# Patient Record
Sex: Male | Born: 1935 | Race: White | Hispanic: No | Marital: Married | Smoking: Former smoker
Health system: Southern US, Community
[De-identification: ages and names within clinical notes are randomized; demographics above are authoritative.]

## PROBLEM LIST (undated history)

## (undated) DIAGNOSIS — R55 Syncope and collapse: Secondary | ICD-10-CM

## (undated) DIAGNOSIS — I480 Paroxysmal atrial fibrillation: Secondary | ICD-10-CM

## (undated) DIAGNOSIS — I214 Non-ST elevation (NSTEMI) myocardial infarction: Secondary | ICD-10-CM

## (undated) DIAGNOSIS — N281 Cyst of kidney, acquired: Secondary | ICD-10-CM

## (undated) DIAGNOSIS — I639 Cerebral infarction, unspecified: Secondary | ICD-10-CM

## (undated) DIAGNOSIS — R7301 Impaired fasting glucose: Secondary | ICD-10-CM

## (undated) DIAGNOSIS — Z87828 Personal history of other (healed) physical injury and trauma: Secondary | ICD-10-CM

## (undated) DIAGNOSIS — I10 Essential (primary) hypertension: Secondary | ICD-10-CM

## (undated) DIAGNOSIS — C61 Malignant neoplasm of prostate: Secondary | ICD-10-CM

## (undated) DIAGNOSIS — I2510 Atherosclerotic heart disease of native coronary artery without angina pectoris: Secondary | ICD-10-CM

## (undated) DIAGNOSIS — M109 Gout, unspecified: Secondary | ICD-10-CM

## (undated) DIAGNOSIS — I80202 Phlebitis and thrombophlebitis of unspecified deep vessels of left lower extremity: Secondary | ICD-10-CM

## (undated) DIAGNOSIS — K627 Radiation proctitis: Secondary | ICD-10-CM

## (undated) DIAGNOSIS — E119 Type 2 diabetes mellitus without complications: Secondary | ICD-10-CM

## (undated) DIAGNOSIS — E785 Hyperlipidemia, unspecified: Secondary | ICD-10-CM

## (undated) HISTORY — DX: Hyperlipidemia, unspecified: E78.5

## (undated) HISTORY — PX: CORNEAL TRANSPLANT: SHX108

## (undated) HISTORY — DX: Cyst of kidney, acquired: N28.1

## (undated) HISTORY — DX: Gout, unspecified: M10.9

## (undated) HISTORY — DX: Impaired fasting glucose: R73.01

## (undated) HISTORY — DX: Phlebitis and thrombophlebitis of unspecified deep vessels of left lower extremity: I80.202

## (undated) HISTORY — DX: Syncope and collapse: R55

## (undated) HISTORY — DX: Malignant neoplasm of prostate: C61

---

## 2001-02-21 ENCOUNTER — Encounter: Payer: Self-pay | Admitting: Family Medicine

## 2001-02-21 ENCOUNTER — Ambulatory Visit (HOSPITAL_COMMUNITY): Admission: RE | Admit: 2001-02-21 | Discharge: 2001-02-21 | Payer: Self-pay | Admitting: Family Medicine

## 2001-12-06 ENCOUNTER — Encounter: Payer: Self-pay | Admitting: Family Medicine

## 2001-12-06 ENCOUNTER — Ambulatory Visit (HOSPITAL_COMMUNITY): Admission: RE | Admit: 2001-12-06 | Discharge: 2001-12-06 | Payer: Self-pay | Admitting: Family Medicine

## 2003-06-22 ENCOUNTER — Ambulatory Visit (HOSPITAL_COMMUNITY): Admission: RE | Admit: 2003-06-22 | Discharge: 2003-06-22 | Payer: Self-pay | Admitting: Family Medicine

## 2003-07-18 ENCOUNTER — Ambulatory Visit (HOSPITAL_COMMUNITY): Admission: RE | Admit: 2003-07-18 | Discharge: 2003-07-18 | Payer: Self-pay | Admitting: Internal Medicine

## 2003-07-18 HISTORY — PX: COLONOSCOPY: SHX174

## 2005-04-22 ENCOUNTER — Observation Stay (HOSPITAL_COMMUNITY): Admission: AD | Admit: 2005-04-22 | Discharge: 2005-04-24 | Payer: Self-pay | Admitting: Family Medicine

## 2006-08-09 ENCOUNTER — Ambulatory Visit: Payer: Self-pay | Admitting: Cardiology

## 2006-08-09 ENCOUNTER — Inpatient Hospital Stay (HOSPITAL_COMMUNITY): Admission: EM | Admit: 2006-08-09 | Discharge: 2006-08-10 | Payer: Self-pay | Admitting: Emergency Medicine

## 2006-10-04 ENCOUNTER — Ambulatory Visit (HOSPITAL_COMMUNITY): Admission: RE | Admit: 2006-10-04 | Discharge: 2006-10-04 | Payer: Self-pay | Admitting: Family Medicine

## 2007-11-15 ENCOUNTER — Ambulatory Visit (HOSPITAL_COMMUNITY): Admission: RE | Admit: 2007-11-15 | Discharge: 2007-11-15 | Payer: Self-pay | Admitting: Family Medicine

## 2007-11-16 ENCOUNTER — Ambulatory Visit (HOSPITAL_COMMUNITY): Admission: RE | Admit: 2007-11-16 | Discharge: 2007-11-16 | Payer: Self-pay | Admitting: Family Medicine

## 2007-12-07 ENCOUNTER — Ambulatory Visit (HOSPITAL_COMMUNITY): Admission: RE | Admit: 2007-12-07 | Discharge: 2007-12-07 | Payer: Self-pay | Admitting: Family Medicine

## 2008-03-12 ENCOUNTER — Encounter: Admission: RE | Admit: 2008-03-12 | Discharge: 2008-03-12 | Payer: Self-pay | Admitting: Rheumatology

## 2009-08-26 DIAGNOSIS — Z87828 Personal history of other (healed) physical injury and trauma: Secondary | ICD-10-CM

## 2009-08-26 HISTORY — DX: Personal history of other (healed) physical injury and trauma: Z87.828

## 2009-09-11 ENCOUNTER — Inpatient Hospital Stay (HOSPITAL_COMMUNITY): Admission: EM | Admit: 2009-09-11 | Discharge: 2009-09-12 | Payer: Self-pay | Admitting: Emergency Medicine

## 2009-11-29 ENCOUNTER — Emergency Department (HOSPITAL_COMMUNITY): Admission: EM | Admit: 2009-11-29 | Discharge: 2009-11-29 | Payer: Self-pay | Admitting: Emergency Medicine

## 2009-12-03 ENCOUNTER — Inpatient Hospital Stay (HOSPITAL_COMMUNITY): Admission: EM | Admit: 2009-12-03 | Discharge: 2009-12-05 | Payer: Self-pay | Admitting: Emergency Medicine

## 2010-04-08 LAB — POCT I-STAT, CHEM 8
Calcium, Ion: 1.24 mmol/L (ref 1.12–1.32)
Chloride: 103 mEq/L (ref 96–112)
HCT: 41 % (ref 39.0–52.0)
Hemoglobin: 13.9 g/dL (ref 13.0–17.0)
Potassium: 4.7 mEq/L (ref 3.5–5.1)

## 2010-04-08 LAB — CBC
HCT: 35.6 % — ABNORMAL LOW (ref 39.0–52.0)
Hemoglobin: 12.5 g/dL — ABNORMAL LOW (ref 13.0–17.0)
Hemoglobin: 13.3 g/dL (ref 13.0–17.0)
MCHC: 34 g/dL (ref 30.0–36.0)
MCHC: 34.5 g/dL (ref 30.0–36.0)
RDW: 13.6 % (ref 11.5–15.5)
WBC: 16.3 10*3/uL — ABNORMAL HIGH (ref 4.0–10.5)
WBC: 8.8 10*3/uL (ref 4.0–10.5)

## 2010-04-08 LAB — GLUCOSE, CAPILLARY
Glucose-Capillary: 167 mg/dL — ABNORMAL HIGH (ref 70–99)
Glucose-Capillary: 185 mg/dL — ABNORMAL HIGH (ref 70–99)
Glucose-Capillary: 195 mg/dL — ABNORMAL HIGH (ref 70–99)
Glucose-Capillary: 197 mg/dL — ABNORMAL HIGH (ref 70–99)
Glucose-Capillary: 218 mg/dL — ABNORMAL HIGH (ref 70–99)

## 2010-04-08 LAB — BASIC METABOLIC PANEL
Calcium: 9.7 mg/dL (ref 8.4–10.5)
GFR calc Af Amer: >60 mL/min (ref >60)
GFR calc non Af Amer: >60 mL/min (ref >60)
GFR calc non Af Amer: >60 mL/min (ref >60)
Glucose, Bld: 163 mg/dL — ABNORMAL HIGH (ref 70–99)
Glucose, Bld: 195 mg/dL — ABNORMAL HIGH (ref 70–99)
Potassium: 5.2 mEq/L — ABNORMAL HIGH (ref 3.5–5.1)
Sodium: 138 mEq/L (ref 135–145)
Sodium: 139 mEq/L (ref 135–145)

## 2010-04-08 LAB — GRAM STAIN

## 2010-04-08 LAB — DIFFERENTIAL
Basophils Absolute: 0.1 10*3/uL (ref 0.0–0.1)
Basophils Relative: 0 % (ref 0–1)
Eosinophils Absolute: 0.1 10*3/uL (ref 0.0–0.7)
Neutro Abs: 10.7 10*3/uL — ABNORMAL HIGH (ref 1.7–7.7)
Neutrophils Relative %: 86 % — ABNORMAL HIGH (ref 43–77)

## 2010-04-08 LAB — SYNOVIAL CELL COUNT + DIFF, W/ CRYSTALS
Eosinophils-Synovial: 0 % (ref 0–1)
WBC, Synovial: 8310 /mm3 — ABNORMAL HIGH (ref 0–200)

## 2010-04-08 LAB — BODY FLUID CULTURE: Culture: NO GROWTH

## 2010-04-11 LAB — BASIC METABOLIC PANEL
BUN: 12 mg/dL (ref 6–23)
CO2: 22 mEq/L (ref 19–32)
Calcium: 8.3 mg/dL — ABNORMAL LOW (ref 8.4–10.5)
Calcium: 9.2 mg/dL (ref 8.4–10.5)
Creatinine, Ser: 1.09 mg/dL (ref 0.4–1.5)
GFR calc Af Amer: >60 mL/min (ref >60)
GFR calc non Af Amer: >60 mL/min (ref >60)
GFR calc non Af Amer: >60 mL/min (ref >60)
Potassium: 3.7 mEq/L (ref 3.5–5.1)
Sodium: 137 mEq/L (ref 135–145)

## 2010-04-11 LAB — DIFFERENTIAL
Basophils Absolute: 0 10*3/uL (ref 0.0–0.1)
Eosinophils Relative: 1 % (ref 0–5)
Lymphocytes Relative: 5 % — ABNORMAL LOW (ref 12–46)
Lymphocytes Relative: 7 % — ABNORMAL LOW (ref 12–46)
Lymphs Abs: 0.5 10*3/uL — ABNORMAL LOW (ref 0.7–4.0)
Monocytes Relative: 5 % (ref 3–12)
Neutro Abs: 8 10*3/uL — ABNORMAL HIGH (ref 1.7–7.7)
Neutro Abs: 9.6 10*3/uL — ABNORMAL HIGH (ref 1.7–7.7)
Neutrophils Relative %: 88 % — ABNORMAL HIGH (ref 43–77)

## 2010-04-11 LAB — HEPATIC FUNCTION PANEL
Albumin: 3.2 g/dL — ABNORMAL LOW (ref 3.5–5.2)
Alkaline Phosphatase: 68 U/L (ref 39–117)
Bilirubin, Direct: 0.2 mg/dL (ref 0.0–0.3)
Total Bilirubin: 0.9 mg/dL (ref 0.3–1.2)

## 2010-04-11 LAB — URINE CULTURE
Colony Count: NO GROWTH
Culture  Setup Time: 201108180221
Culture: NO GROWTH

## 2010-04-11 LAB — CBC
HCT: 38 % — ABNORMAL LOW (ref 39.0–52.0)
Hemoglobin: 12.9 g/dL — ABNORMAL LOW (ref 13.0–17.0)
Platelets: 138 10*3/uL — ABNORMAL LOW (ref 150–400)
RBC: 3.78 MIL/uL — ABNORMAL LOW (ref 4.22–5.81)
RDW: 13.6 % (ref 11.5–15.5)
WBC: 11.2 10*3/uL — ABNORMAL HIGH (ref 4.0–10.5)

## 2010-04-11 LAB — CORTISOL: Cortisol, Plasma: 13.4 ug/dL

## 2010-04-11 LAB — GLUCOSE, CAPILLARY
Glucose-Capillary: 119 mg/dL — ABNORMAL HIGH (ref 70–99)
Glucose-Capillary: 98 mg/dL (ref 70–99)

## 2010-04-11 LAB — POCT CARDIAC MARKERS
CKMB, poc: 1.9 ng/mL (ref 1.0–8.0)
Troponin i, poc: <0.05 ng/mL (ref 0.00–0.09)

## 2010-04-11 LAB — CULTURE, BLOOD (ROUTINE X 2)
Report Status: 8222011
Report Status: 8222011

## 2010-04-11 LAB — MRSA PCR SCREENING: MRSA by PCR: NEGATIVE

## 2010-04-11 LAB — URINALYSIS, ROUTINE W REFLEX MICROSCOPIC
Bilirubin Urine: NEGATIVE
Ketones, ur: NEGATIVE mg/dL
Nitrite: NEGATIVE
pH: 7 (ref 5.0–8.0)

## 2010-04-11 LAB — LACTIC ACID, PLASMA: Lactic Acid, Venous: 1.1 mmol/L (ref 0.5–2.2)

## 2010-04-11 LAB — CARDIAC PANEL(CRET KIN+CKTOT+MB+TROPI)
Relative Index: INVALID (ref 0.0–2.5)
Relative Index: INVALID (ref 0.0–2.5)
Total CK: 34 U/L (ref 7–232)
Total CK: 46 U/L (ref 7–232)
Troponin I: 0.01 ng/mL (ref 0.00–0.06)

## 2010-04-11 LAB — URIC ACID: Uric Acid, Serum: 10 mg/dL — ABNORMAL HIGH (ref 4.0–7.8)

## 2010-04-11 LAB — HEMOCCULT GUIAC POC 1CARD (OFFICE): Fecal Occult Bld: NEGATIVE

## 2010-05-13 ENCOUNTER — Ambulatory Visit (HOSPITAL_COMMUNITY)
Admission: RE | Admit: 2010-05-13 | Discharge: 2010-05-13 | Disposition: A | Discharge status: Home / Self Care | Payer: Medicare Other | Source: Ambulatory Visit | Attending: Internal Medicine | Admitting: Internal Medicine

## 2010-05-13 ENCOUNTER — Other Ambulatory Visit (HOSPITAL_COMMUNITY): Payer: Self-pay | Admitting: Internal Medicine

## 2010-05-13 DIAGNOSIS — M7730 Calcaneal spur, unspecified foot: Secondary | ICD-10-CM | POA: Insufficient documentation

## 2010-05-13 DIAGNOSIS — Q766 Other congenital malformations of ribs: Secondary | ICD-10-CM

## 2010-05-13 DIAGNOSIS — M25579 Pain in unspecified ankle and joints of unspecified foot: Secondary | ICD-10-CM

## 2010-05-13 DIAGNOSIS — Q767 Congenital malformation of sternum: Secondary | ICD-10-CM

## 2010-06-10 NOTE — Discharge Summary (Signed)
[name], [name]               ACCOUNT NO.:  [id number]   MEDICAL RECORD NO.:  [id number]          PATIENT TYPE:  INP   LOCATION:  A215                          FACILITY:  [facility]   PHYSICIAN:  Scott Moores, MD   DATE OF BIRTH:  1931/09/27   DATE OF ADMISSION:  08/09/2006  DATE OF DISCHARGE:  07/15/2008LH                               DISCHARGE SUMMARY   PRIMARY CARE PHYSICIAN:  [name], M.D.   DISCHARGE DIAGNOSIS:  1. Chest pain to rule out acute coronary artery syndrome.  Acute      coronary artery syndrome is ruled out.  2. Hypertension fairly controlled.  3. History of transient ischemic attack on Plavix.  4. Arthritis, stable.   HOME MEDICATIONS:  1. Metoprolol 50 mg p.o. once a day.  2. Plavix 75 mg once a day.   HOSPITAL COURSE:  [name] is a 77 year old male patient with history  of cerebrovascular accident in the past, history of hypertension and  history of arthritis who presented with chest pain starting on the  anterior chest which radiates to the left arm and shoulder,  intermittent, which was relieved by nitroglycerin in the emergency room.  The patient was admitted to rule out acute coronary artery syndrome and  3 serial cardiac enzymes were sent which were normal and serial EKG was  also done with no significant abnormality except right branch bundle  block sign.  He was evaluated by cardiologist as well who found no  significant abnormality.  Echocardiogram was done and it was with no  significant abnormality with ejection fraction of 60-65%.  Stress test  was done today and we will follow that.  The patient will be discharged  after the stress test result is available.  He will have followup with  his PMD for further management and followup.   Currently, he is very stable without any pain.  Vital signs:  Blood  pressure 138/68, temperature 98, pulse rate 57, respiratory rate 20.  Saturating well.  His basic labs are also normal.  White  blood cell 70,  hemoglobin is 13 and hematocrit is 38, platelet count 151.  Chemistries  also normal with sodium 142, potassium 3.4, chloride 110, bicarb is 26,  glucose is a little bit elevated at 138.  BUN is 18 and creatinine is 1.  Three serial cardiac enzymes within normal range.   The patient will be discharged after the stress test result is available  after a few hours and he was advised to have followup with his PMD, Dr.  Karleen [name].      Scott Moores, MD  Electronically Signed     MT/MEDQ  D:  08/10/2006  T:  08/11/2006  Job:  [id number]   cc:   [name], M.D.  Fax: [phone]

## 2010-06-10 NOTE — Procedures (Signed)
NAMEIZMAEL, DUROSS               ACCOUNT NO.:  [id number]   MEDICAL RECORD NO.:  [id number]          PATIENT TYPE:  INP   LOCATION:  A215                          FACILITY:  [facility]   PHYSICIAN:  Cristy Hilts. Jacinto Halim, MD       DATE OF BIRTH:  Mar 29, 1931   DATE OF PROCEDURE:  08/09/2006  DATE OF DISCHARGE:                                ECHOCARDIOGRAM   PROCEDURE:  Doppler echocardiogram.   INDICATIONS FOR PROCEDURE:  Chest pain, hypertension.  Please evaluate  wall motion abnormality.   Aortic root is 3.4 cm.   Left atrium is 4.2 cm.   Septum 1.3 cm.   Posterior wall 1.3 cm.   Left ventricular diastolic dimension 4.3 cm.  Left ventricular systolic  dimension 2.8 cm.   Ejection fraction 60 to 65%.   Left atrium:  The left atrium shows mild left atrial enlargement.   Left ventricle:  The left ventricle shows moderate basal septal  hypertrophy and mild concentric left ventricular hypertrophy.  There is  no evidence of left ventricular outflow tract obstruction.  There is no  wall motion abnormality.  Ejection fraction is estimated at 60 to 65%.   Right atrium:  The right atrium is normal.   Right ventricle:  The right ventricle is normal.   Pericardium:  The pericardium is normal.   Aortic valve:  The aortic valve is normal.   Mitral valve:  The mitral valve is normal.   Pulmonic valve:  The pulmonic valve is normal.   Tricuspid valve:  The tricuspid valve is normal.   FINAL INTERPRETATION:  1. Normal left ventricular systolic function with ejection fraction 60      to 65%.  2. Moderate basal septal hypertrophy and mild concentric left      ventricular hypertrophy without evidence of left ventricular      outflow tract obstruction.  3. No significant valvular abnormality. Dopplers are within normal      limits.      Cristy Hilts. Jacinto Halim, MD  Electronically Signed     JRG/MEDQ  D:  08/09/2006  T:  08/10/2006  Job:  [id number]   cc:   Ninetta Lights D. Felecia Shelling, MD  Fax: [phone]

## 2010-06-10 NOTE — Consult Note (Signed)
NAMEKRUZ, CHIU               ACCOUNT NO.:  [id number]   MEDICAL RECORD NO.:  [id number]          PATIENT TYPE:  INP   LOCATION:  A215                          FACILITY:  [facility]   PHYSICIAN:  Gerrit Friends. Dietrich Pates, MD, FACCDATE OF BIRTH:  [birth year]   DATE OF CONSULTATION:  08/09/2006  DATE OF DISCHARGE:                                 CONSULTATION   PRIMARY CARE PHYSICIAN:  Kirk Ruths, M.D.   HISTORY OF PRESENT ILLNESS:  A 77 year old gentleman with multiple  cardiovascular risk factors but no known coronary disease admitted to  the hospital with chest discomfort.  Mr. Raz noted the sudden onset  of moderately severe mid substernal chest discomfort early this morning.  The pain at times increased or decreased in severity, but was  essentially constant.  There was associated numbness in the left arm.  No dyspnea nor diaphoresis was noted.  There was a gradual decrease in  severity in the emergency department.  Symptoms were ultimately relieved  with sublingual nitroglycerin.  Mr. Vandervliet has had chest discomfort in  the past, but nothing quite like this.  He has a hard time specifying  the character of the discomfort.  It was not clearly pressure or  fullness.   Ms. Gaertner has had a history of hypertension that has apparently been  well-controlled.  He has never had diabetes.  He has been told that his  lipid status is acceptable.  He has mild known cerebrovascular disease,  having presented with a probable TIA in March of 2007.  MRA suggested  some basilar artery stenosis.  Carotid ultrasound revealed  atherosclerotic plaques without focal disease.   ADDITIONAL MEDICAL PROBLEMS:  1. Gout.  2. The patient has had bilateral cornea transplants.   MEDICATIONS ON ADMISSION:  1. Metoprolol 50 mg daily.  2. Clopidogrel 75 mg daily.   ALLERGIES:  There are no known allergies.   SOCIAL HISTORY:  Married and lives locally; no excessive use of alcohol;  chews tobacco, but  has not smoked cigarettes.  Retired.   FAMILY HISTORY:  Positive for CVA and hypertension.   REVIEW OF SYSTEMS:  Partial dentures; corrective lenses.  All other  systems reviewed and are negative.   PHYSICAL EXAMINATION:  GENERAL:  Somewhat gruff gentleman in no acute  distress.  VITAL SIGNS:  Temperature is 97, blood pressure 120/75, heart rate 55  and regular, respirations 20, O2 saturation 98% on room air.  HEENT:  Anicteric sclerae; normal lids and conjunctivae.  NECK:  No jugular venous distension; normal carotid upstrokes without  bruits.  ENDOCRINE:  No thyromegaly.  SKIN:  No significant lesions.  HEMATOPOIETIC:  No adenopathy.  LUNGS:  Clear.  CARDIAC:  Distant first and second heart sounds.  ABDOMEN:  Soft and nontender; no organomegaly.  EXTREMITIES:  No edema; distal pulses intact.  NEUROMUSCULAR:  Symmetric strength and tone; normal cranial nerves.  MUSCULOSKELETAL:  No joint deformities.   ELECTROCARDIOGRAM:  Sinus bradycardia; right bundle branch block; left  anterior fascicular block.  No acute abnormalities.   CHEST X-RAY:  Unremarkable.   LABORATORY DATA:  Other  laboratory notable for negative cardiac markers.  CBC is normal.  A chemistry profile is normal except for minimal  hyperglycemia and minimal hypokalemia.  A lipid profile is available  from 2007.  At that time, total cholesterol was 186, triglycerides 102,  LDL 135 and HDL 32.  TSH was also normal at 0.8.   IMPRESSION:  Mr. Closson presents with chest discomfort that is worrisome  in terms of its location and radiation.  Somewhat reassuring is the  absence of acute EKG abnormalities, negative cardiac markers and a  benign exam.  In this setting, either a stress test or coronary  angiography would be a reasonable first test.  The patient prefers a  noninvasive workup.  A stress Myoview study will be scheduled for the  second hospital day.  Serial cardiac markers and EKGs will be completed.  In the  presence of cerebrovascular disease, albeit mild, and modest  dyslipidemia, the patient would likely benefit from treatment with a  statin.   We appreciate the request for consultation and will be happy to manage  this nice patient with you.      Gerrit Friends. Dietrich Pates, MD, [facility]  Electronically Signed     RMR/MEDQ  D:  08/10/2006  T:  08/11/2006  Job:  [id number]

## 2010-06-10 NOTE — H&P (Signed)
[name], [name]               ACCOUNT NO.:  [id number]   MEDICAL RECORD NO.:  [id number]          PATIENT TYPE:  INP   LOCATION:  A215                          FACILITY:  [facility]   PHYSICIAN:  Marcello Moores, MD   DATE OF BIRTH:  Apr 21, 1931   DATE OF ADMISSION:  08/09/2006  DATE OF DISCHARGE:  LH                              HISTORY & PHYSICAL   PMD:  Dr. Karleen Hampshire.   CHIEF COMPLAINTS:  Chest pain.   HISTORY OF PRESENT ILLNESS:  [name] is a 77 year old male patient,  with history of cerebrovascular accident in the past and hypertension,  who presented with the above complaint.  The patient stated that he had  sudden onset of chest pain this morning around 7:00 a.m. after he took  his breakfast and it was anterior chest pain, pressure type, which  radiates to the left shoulder and arm.  And he stated that it is about 6-  7 in severity out of 10.  He denied any vomiting, no palpitation, no  sweating, no dizziness, no fever.  The patient denied any such episode  before and he stated that he had a mini stroke years back and he had  hypertension and he takes metoprolol for that and Plavix for the history  of mini stroke.  Otherwise, the patient is relatively healthy.   REVIEW OF SYSTEMS:  10-point review of systems is as dictated in HPI,  otherwise he denied any fever, no GI complaints, no urinary complaints.   ALLERGIES:  To TETANUS VACCINES.   SOCIAL HISTORY:  He is married and lives in [place] with his wife and  he denies any smoking, alcohol or drug abuse but he chews tobacco.   FAMILY HISTORY:  It was stated that he had history of CVA and  hypertension in the family.   PAST MEDICAL HISTORY:  1. TIA in 2007, on Plavix.  2. Hypertension, fairly controlled.  3. Arthritis.   HOME MEDICATIONS:  1. Metoprolol 50 mg once a day.  2. Plavix 75 mg once a day.   PHYSICAL EXAMINATION:  The patient is in the bed without any distress  and stated the pain has subsided  now after taking medication.  VITAL SIGNS:  Blood pressure 138/73, temperature 98, pulse is 63, and  respiratory rate 20, and saturation is 98%.  HEENT:  She has pink conjunctivae.  Nonicteric sclerae.  NECK:  Is supple.  CHEST:  Good air entry bilaterally.  CVS:  S1-S2 well heard, regular.  ABDOMEN:  Soft.  No area of tenderness.  Normoactive bowel sounds.  EXTREMITIES:  No pedal edema.  CNS:  He is alert and well oriented.  No neurological deficit.   LABS:  Chemistry - sodium 142, potassium 3.4, chloride 110, and bicarb  26.  Glucose is 138.  BUN 18, creatinine 1, calcium 1.  Troponin is less  than 0.05 and CK-MB is 3.1.  On the hematology - white blood cells 7,  hemoglobin is 13, hematocrit 38, and platelet count is 151.  EKG showed  normal sinus rhythm with right bundle branch block.  Otherwise, there  are no ST T wave change in elevation.  Chest x-ray is normal.   ASSESSMENT:  1. Chest pain.  To rule out acute coronary syndrome.  The patient is      elderly man with typical chest pain which starts on the anterior      chest and radiates to the left shoulder and arm and this is      significant and will admit him to the floor for further cardiac      workup and will do cardiac markers every 8 hours with      electrocardiogram and reevaluate the patient's pain.  Currently he      has relieved it by nitroglycerin.  And will do echocardiogram as      well and will consult Cardiology for further assessment.  Will put      him on angiotensin-converting enzyme inhibitor, aspirin, and will      continue his Plavix, and will put him on nitroglycerin as needed as      well.  For now, I will hold metoprolol because his pulse rate was      in the lower 60s and upper 50s on the monitor and, if it is not      continued to be on the lower side, we will restart it.  2. Hypertension.  Fairly controlled and will monitor his blood      pressure and will restart his home medications depending on       clinical evaluation.  3. History of transient ischemic attack.  For that, he was on Plavix      and will continue with his Plavix.  Currently he has no issues      related to that.      Marcello Moores, MD  Electronically Signed     MT/MEDQ  D:  08/09/2006  T:  08/10/2006  Job:  [id number]

## 2010-06-13 NOTE — Op Note (Signed)
NAME:  [name], [name]                         ACCOUNT NO.:  [id number]   MEDICAL RECORD NO.:  [id number]                   PATIENT TYPE:  AMB   LOCATION:  DAY                                  FACILITY:  [facility]   PHYSICIAN:  R. Roetta Sessions, M.D.              DATE OF BIRTH:  11/16/1931   DATE OF PROCEDURE:  07/18/2003  DATE OF DISCHARGE:                                 OPERATIVE REPORT   PROCEDURE:  Screening colonoscopy.   INDICATIONS FOR PROCEDURE:  The patient is a 77 year old gentleman sent over  at the courtesy of Dr. Yetta Numbers for colorectal cancer screening.  He is  devoid of any lower GI tract symptoms.  He has never had a colonoscopy.  He  tells me his brother had a resection for colorectal cancer in his 57s.  Colonoscopy is now being done as a screening maneuver.  This approach has  been discussed with the patient at length.  Potential risks, benefits, and  alternatives have been reviewed, questions answered, and he is agreeable.  Please see my handwritten H&P for more information.   PROCEDURE NOTE:  O2 saturations, blood pressure, pulse, and respirations  were monitored throughout the entire procedure.   CONSCIOUS SEDATION:  1. Versed 4 mg IV.  2. Demerol 100 mg IV in divided doses.   INSTRUMENT:  Olympus video chip system.   FINDINGS:  Digital rectal examination revealed no abnormalities.   ENDOSCOPIC FINDINGS:  The prep was good.   RECTUM:  Examination of rectal mucosa including retroflexed view of the anal  verge revealed no abnormalities aside from internal hemorrhoids.   COLON:  Colonic mucosa was surveyed from the rectosigmoid junction through  the left transverse and right colon to the area of the appendiceal orifice,  ileocecal valve, and the cecum.  These structures were well-seen,  photographed for the record.  From this level, the scope was slowly  withdrawn.  All previously mentioned mucosal surfaces were again seen.  The  colonic mucosa appeared  normal aside from left-sided diverticula.  The  patient tolerated the procedure well, was reacted in endoscopy.   IMPRESSION:  1. Rectum internal hemorrhoids.  Otherwise, normal rectum.  2. Left-sided diverticula.  Remainder of colonic mucosa appeared normal.   RECOMMENDATIONS:  1. Diverticulosis.  Literature provided to [name].  2. Recommend repeat colonoscopy 5 years.      ___________________________________________                                            Jonathon Bellows, M.D.   RMR/MEDQ  D:  07/18/2003  T:  07/19/2003  Job:  [id number]   cc:   Kirk Ruths, M.D.  [address]  [place]  [place] [address]  Fax: [phone]

## 2010-06-13 NOTE — H&P (Signed)
[name], EVERY               ACCOUNT NO.:  [id number]   MEDICAL RECORD NO.:  [id number]          PATIENT TYPE:  INP   LOCATION:  A212                          FACILITY:  [facility]   PHYSICIAN:  [name], M.D.  DATE OF BIRTH:  1932/01/12   DATE OF ADMISSION:  04/22/2005  DATE OF DISCHARGE:  LH                                HISTORY & PHYSICAL   ADMISSION DIAGNOSIS:  Transient ischemic attack.   HISTORY OF PRESENT ILLNESS:  A 77 year old male with long history of gout  and gouty arthritis and hypertension who presents with left sided weakness  and numbness. He awoke this morning and was in his usual state of health  when he had around one to two minutes of left arm and left leg numbness and  mild weakness. He said it was a very odd feeling and seemed to get better  after just a few minutes. He reports no visual changes at that time. He  reports no speech difficulties or other complaint. These symptoms have  completely resolved, and the weakness has completely gone away as well.   He came into the office for evaluation. We decided that he should be put in  the hospital for further evaluation of TIA. He does have a very strong  family history of CVAs in both of his parents and other siblings. His blood  pressure has relatively well controlled on low-dose metoprolol 25 mg b.i.d.,  and at times, he only takes it once a day. His blood pressure was elevated  in the office at 142/90.   PAST MEDICAL HISTORY:  1.  Gout.  2.  Hypertension.   PAST SURGICAL HISTORY:  Corneal transplant 1998.   MEDICATIONS ON ADMISSION:  1.  Multivitamins including vitamin C and generic multivitamin.  2.  Metoprolol 25 mg b.i.d.   ALLERGIES:  No known drug allergies.   SOCIAL HISTORY:  Chews tobacco. No alcohol. No smoking. Retired, lives with  his wife.   FAMILY HISTORY:  Significant for CVA and hypertension as stated.   PHYSICAL EXAMINATION:  VITAL SIGNS:  On admission, blood pressure  142/90,  pulse 60, respirations 18, weight 250.  GENERAL:  When I saw the gentleman, he was pleasant, talkative, in no acute  distress with no symptoms.  HEENT:  Pupils are equal, round, and reactive to light. Extraocular  movements intact. Nasopharynx is clear with moist mucous membranes.  NECK:  Supple. No lymphadenopathy. No thyromegaly.  CHEST:  Clear to auscultation bilaterally.  CARDIOVASCULAR:  Regular rhythm. Normal S1 and S2. No S3, S4, murmurs,  gallops or rubs. Carotids are negative bilaterally without bruits.  ABDOMEN:  Soft, nontender, nondistended.  EXTREMITIES:  No clubbing, cyanosis, or edema.  NEUROLOGICAL:  Cranial nerves II-XII are intact. Strength 5/5 throughout.  Sensation is intact throughout. Completely nonfocal exam.   ASSESSMENT AND PLAN:  A 77 year old gentleman with hypertension and gout who  presents with TIA.   PLAN:  1.  Admit to 2-A for close monitoring.  2.  Cover with proper dosing of Lovenox 1 mg/kg subcu q.12h.  3.  Start aspirin  325 daily.  4.  Increase metoprolol to 50 mg p.o. b.i.d.  5.  CBC, Chem 12, PT, PTT on admission.  6.  Fasting lipids.  7.  TSH.  8.  Stat head CT on admission with no contrast.  9.  MRI/MRA of brain.  10. Echocardiogram.  11. Carotid Dopplers bilaterally.  12. Consult neurology today.  13. Will continue to follow closely.      [name], M.D.  Electronically Signed     JCG/MEDQ  D:  04/22/2005  T:  04/22/2005  Job:  [id number]

## 2010-06-13 NOTE — Procedures (Signed)
[name], [name]               ACCOUNT NO.:  [id number]   MEDICAL RECORD NO.:  [id number]          PATIENT TYPE:  INP   LOCATION:  A212                          FACILITY:  [facility]   PHYSICIAN:  Dani Gobble, MD       DATE OF BIRTH:  04-20-1931   DATE OF PROCEDURE:  04/23/2005  DATE OF DISCHARGE:                                  ECHOCARDIOGRAM   REFERRING:  Dr. Phillips Odor and Dr. Domingo Sep.   INDICATIONS:  A 77 year old gentleman with past medical history of  hypertension who has had a possible TIA.   Technical quality of the study is reasonable.   The aorta measures normally at 3.6 cm.   Left atrium is mildly dilated measured at 4.2 cm.  No obvious clots or  masses were appreciated.  The patient appeared to be in sinus rhythm during  the procedure.   The interventricular septum and posterior wall were mildly thickened at 1.4  cm and 1.2 cm, respectively.   The aortic valve is trileaflet and pliable, although mildly thickened but  without limitation of leaflet excursion. No aortic insufficiency is noted.  Doppler interrogation of the aortic valve is within normal limits.   Mitral valve appears grossly structurally normal.  No mitral valve prolapse  noted.  Mild mitral regurgitation is noted. Doppler interrogation of the  mitral valve is within normal limits.   The pulmonic valve is not well visualized.   The tricuspid valve is also poorly visualized.  Mild tricuspid regurgitation  is noted.  No evidence for pulmonary hypertension is suggested.   The left ventricle is normal size with LVIDD measured at 4.4 cm and LVISD  measured at 3.4 cm.  Overall left ventricular systolic function is normal,  and no regional wall motion abnormalities are noted.   The right ventricle appears mildly dilated and assessment of right  ventricular systolic function is somewhat difficult on this study.   IMPRESSION:  1.  Mild left atrial enlargement.  2.  Mild concentric LVH.  3.  Mild aortic  sclerosis without stenosis.  4.  Mild mitral and tricuspid regurgitation.  5.  Normal left ventricular size and systolic function without regional wall      motion abnormality noted.  6.  Mild to moderate right ventricular enlargement.  The right ventricular      systolic function is somewhat difficult to assess on this study.  7.  No obvious clots or masses were appreciated on this study but the      possibility cannot be excluded.           ______________________________  Dani Gobble, MD     AB/MEDQ  D:  04/23/2005  T:  04/25/2005  Job:  [id number]

## 2010-06-13 NOTE — Consult Note (Signed)
NAMESHANARD, [name]               ACCOUNT NO.:  [id number]   MEDICAL RECORD NO.:  [id number]          PATIENT TYPE:  INP   LOCATION:  A212                          FACILITY:  [facility]   PHYSICIAN:  Kofi A. Gerilyn Pilgrim, M.D. DATE OF BIRTH:  01/10/1932   DATE OF CONSULTATION:  04/23/2005  DATE OF DISCHARGE:                                   CONSULTATION   HISTORY:  This 77 year old Caucasian man who presented with acute left sided  numbness, lasted for about a couple of minutes. The left upper extremity and  left leg were involved. There were no other symptoms reported until this  morning when he developed headache in the left temple region. He does not  report dysarthria, dysphagia or any focal neurological problems. He has a  baseline history of hypertension. He had been taking any form of  antiplatelet agents on a consistent basis.   PAST MEDICAL HISTORY:  1.  Hypertension.  2.  Gout.   ADMISSION MEDICATIONS:  1.  Vitamin C.  2.  Multivitamins.  3.  Metoprolol 25 mg b.i.d.   ALLERGIES:  None known.   SOCIAL HISTORY:  He chews tobacco on a regular basis. He is retired and  lives with his wife although he works in his house and is very active.   FAMILY HISTORY:  Significant for hypertension and stroke.   PHYSICAL EXAMINATION:  GENERAL:  Shows an overweight man in no acute  distress.  VITAL SIGNS:  Temperature 97.9, pulse 58, respirations 20, blood pressure  139/79.  HEENT:  Shows neck is supple. Head is normocephalic, atraumatic.  ABDOMEN:  Soft.  EXTREMITIES:  No significant edema or varicosities.  NEUROLOGICAL:  Mentation:  The patient is awake and alert. He converses  well. He is lucid and coherent. Speech, language and cognition are intact.  Cranial nerves evaluation shows that pupils are equally round and reactive  to light and accommodation. Extraocular movements are full. Visual fields  are intact. Facial muscle strength is symmetric. Tongue is midline. Motor  examination shows normal tone, bulk and strength. There is no pronator  drift. Coordination does not indicate any dysmetria. Reflexes are preserved.  Sensory examination normal to temperature and light touch. Gait is normal.   STUDIES:  MRI of the brain was reviewed and shows nothing acute on diffusion  imaging. There is mild atrophy. Official report reads possible normal  pressure hydrocephalus, but I see no evidence of this at this time, and  clinically, the patient does not report any symptoms of gait impairment or  urinary incontinence. Additionally, there are no reports of cognitive  impairment or memory loss. 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 Other intracranial vessels are normal. The carotid Doppler shows no  hemodynamic significant stenosis. Echocardiogram has been done and is  pending.   LABORATORY DATA:  WBC 7, hemoglobin 30, platelet count 160. INR 1.0. Sodium  144, potassium 3.5, chloride 107, CO2 24, glucose 106, BUN 15, creatinine  1.0. Liver enzymes normal. Calcium 8.9. Cholesterol  total 186,  triglycerides 102, LDL 135, HDL 32. TSH 0.82.  IMPRESSION:  1.  Transient ischemic attack.  2.  Mild dyslipidemia.  3.  Hypertension.  4.  Doubt normal pressure hydrocephalus.   RECOMMENDATIONS:  1.  Consider statin treatment for mild dyslipidemia and also mid basilar      stenosis. Additionally, diet control should also be done.  2.  Follow up echocardiogram report.  3.  Continue with aspirin therapy. His Lovenox can be reduced to DVT      prevention levels.      Kofi A. Gerilyn Pilgrim, M.D.  Electronically Signed     KAD/MEDQ  D:  04/23/2005  T:  04/23/2005  Job:  [id number]

## 2010-08-10 ENCOUNTER — Emergency Department (HOSPITAL_COMMUNITY)
Admission: EM | Admit: 2010-08-10 | Discharge: 2010-08-10 | Disposition: A | Discharge status: Other Acute Inpatient Hospital | Payer: Medicare Other | Source: Home / Self Care | Attending: Emergency Medicine | Admitting: Emergency Medicine

## 2010-08-10 ENCOUNTER — Other Ambulatory Visit: Payer: Self-pay

## 2010-08-10 ENCOUNTER — Emergency Department (HOSPITAL_COMMUNITY): Payer: Medicare Other

## 2010-08-10 ENCOUNTER — Inpatient Hospital Stay (HOSPITAL_COMMUNITY)
Admission: EM | Admit: 2010-08-10 | Discharge: 2010-08-13 | DRG: 069 | Disposition: A | Discharge status: Home Health | Payer: Medicare Other | Attending: Internal Medicine | Admitting: Internal Medicine

## 2010-08-10 DIAGNOSIS — Z947 Corneal transplant status: Secondary | ICD-10-CM

## 2010-08-10 DIAGNOSIS — R42 Dizziness and giddiness: Secondary | ICD-10-CM

## 2010-08-10 DIAGNOSIS — M109 Gout, unspecified: Secondary | ICD-10-CM | POA: Diagnosis present

## 2010-08-10 DIAGNOSIS — I10 Essential (primary) hypertension: Secondary | ICD-10-CM | POA: Insufficient documentation

## 2010-08-10 DIAGNOSIS — R51 Headache: Secondary | ICD-10-CM | POA: Insufficient documentation

## 2010-08-10 DIAGNOSIS — D696 Thrombocytopenia, unspecified: Secondary | ICD-10-CM | POA: Diagnosis present

## 2010-08-10 DIAGNOSIS — F17200 Nicotine dependence, unspecified, uncomplicated: Secondary | ICD-10-CM | POA: Diagnosis present

## 2010-08-10 DIAGNOSIS — G459 Transient cerebral ischemic attack, unspecified: Principal | ICD-10-CM | POA: Diagnosis present

## 2010-08-10 DIAGNOSIS — R4182 Altered mental status, unspecified: Secondary | ICD-10-CM

## 2010-08-10 DIAGNOSIS — Z79899 Other long term (current) drug therapy: Secondary | ICD-10-CM | POA: Insufficient documentation

## 2010-08-10 DIAGNOSIS — E119 Type 2 diabetes mellitus without complications: Secondary | ICD-10-CM | POA: Diagnosis present

## 2010-08-10 DIAGNOSIS — R5381 Other malaise: Secondary | ICD-10-CM | POA: Insufficient documentation

## 2010-08-10 DIAGNOSIS — Z7982 Long term (current) use of aspirin: Secondary | ICD-10-CM

## 2010-08-10 DIAGNOSIS — Z87891 Personal history of nicotine dependence: Secondary | ICD-10-CM | POA: Insufficient documentation

## 2010-08-10 DIAGNOSIS — Z8673 Personal history of transient ischemic attack (TIA), and cerebral infarction without residual deficits: Secondary | ICD-10-CM

## 2010-08-10 DIAGNOSIS — I672 Cerebral atherosclerosis: Secondary | ICD-10-CM | POA: Diagnosis present

## 2010-08-10 DIAGNOSIS — E785 Hyperlipidemia, unspecified: Secondary | ICD-10-CM | POA: Diagnosis present

## 2010-08-10 DIAGNOSIS — R5383 Other fatigue: Secondary | ICD-10-CM | POA: Insufficient documentation

## 2010-08-10 HISTORY — DX: Essential (primary) hypertension: I10

## 2010-08-10 LAB — DIFFERENTIAL
Basophils Relative: 0 % (ref 0–1)
Lymphs Abs: 2.3 10*3/uL (ref 0.7–4.0)
Monocytes Absolute: 1.1 10*3/uL — ABNORMAL HIGH (ref 0.1–1.0)
Monocytes Relative: 10 % (ref 3–12)
Neutro Abs: 7.6 10*3/uL (ref 1.7–7.7)

## 2010-08-10 LAB — CK TOTAL AND CKMB (NOT AT ARMC)
CK, MB: 7.2 ng/mL (ref 0.3–4.0)
Total CK: 76 U/L (ref 7–232)

## 2010-08-10 LAB — COMPREHENSIVE METABOLIC PANEL
Albumin: 2.9 g/dL — ABNORMAL LOW (ref 3.5–5.2)
BUN: 17 mg/dL (ref 6–23)
CO2: 25 mEq/L (ref 19–32)
Chloride: 106 mEq/L (ref 96–112)
Creatinine, Ser: 1.1 mg/dL (ref 0.50–1.35)
GFR calc Af Amer: >60 mL/min (ref >60)
GFR calc non Af Amer: >60 mL/min (ref >60)
Glucose, Bld: 125 mg/dL — ABNORMAL HIGH (ref 70–99)
Total Bilirubin: 0.4 mg/dL (ref 0.3–1.2)

## 2010-08-10 LAB — CBC
HCT: 41.5 % (ref 39.0–52.0)
Hemoglobin: 14 g/dL (ref 13.0–17.0)
MCH: 33.9 pg (ref 26.0–34.0)
MCHC: 33.7 g/dL (ref 30.0–36.0)
RBC: 4.13 MIL/uL — ABNORMAL LOW (ref 4.22–5.81)

## 2010-08-10 LAB — APTT: aPTT: 23 seconds — ABNORMAL LOW (ref 24–37)

## 2010-08-10 LAB — PROTIME-INR: INR: 1.01 (ref 0.00–1.49)

## 2010-08-10 MED ORDER — SODIUM CHLORIDE 0.9 % IJ SOLN
INTRAMUSCULAR | Status: AC
  Filled 2010-08-10: qty 20

## 2010-08-10 NOTE — ED Notes (Signed)
Patient left ED via stretcher in the care of [facility] to [facility] ED. Report given to [facility] crew and called to [place], Charity fundraiser at [facility] ED.

## 2010-08-10 NOTE — ED Notes (Signed)
Pt says felt fine when he woke up this morning approx 1 hour ago had a "funny feeling" in left side of head. Pt states he stood up and was extremely dizzy.  Pt denies pain but repeatedly states head doesn't feel right.  Pt answering questions but is slow to respond and drowsy.  Will notify EDP

## 2010-08-10 NOTE — ED Notes (Signed)
CRITICAL VALUE ALERT  Critical value received:  CKMB 7.2  Date of notification:  08/10/10  Time of notification:  1755  Critical value read back:yes  Nurse who received alert:  Lake Bells, RN  MD notified (1st page):  Dr. Fonnie Jarvis notified. [facility] ED charge nurse and [facility] crew notified of Critical result.

## 2010-08-10 NOTE — ED Notes (Signed)
Patient transported to and from CT without complications. RN remained with patient during transport. Patient is oriented x 4. Expressive aphasia noted. Patient very lethargic. Pupils remain at 4mm and perrl  Patient to be transported to [facility] ED.

## 2010-08-10 NOTE — ED Provider Notes (Signed)
History     Chief Complaint  Patient presents with  . Altered Mental Status    Pt says head doesn't feel right, slow to answer   HPI Comments: Last seen normal 1400 today, was sitting watching TV likely within one hour PTA stood up to help wife felt mild left headache (actually denies pain calls it a funny feeling) and vertigo with inability to walk, tired and falling asleep, mild confusion and slow but not slurred speech, generally weak, no lateralizing neuro Sxs, no CP/SOB/light-headed, is unable to sit up or stand.  Patient is a 77 y.o. male presenting with Acute Neurological Problem.  Cerebrovascular Accident This is a new problem. The current episode started today. The problem occurs constantly. The problem has been unchanged. Associated symptoms include headaches and weakness. Pertinent negatives include no abdominal pain, chest pain, congestion, coughing, fever, numbness, rash or vomiting. The symptoms are aggravated by nothing. He has tried nothing for the symptoms.  Cerebrovascular Accident This is a new problem. The current episode started today. The problem occurs constantly. The problem has been unchanged. Associated symptoms include headaches. Pertinent negatives include no chest pain, no abdominal pain and no shortness of breath. The symptoms are aggravated by nothing. He has tried nothing for the symptoms.    Past Medical History  Diagnosis Date  . Hypertension   . Gout     History reviewed. No pertinent past surgical history.  History reviewed. No pertinent family history.  History  Substance Use Topics  . Smoking status: Former Games developer  . Smokeless tobacco: Not on file  . Alcohol Use: No      Review of Systems  Constitutional: Negative for fever.       10 Systems reviewed and are negative for acute change except as noted in the HPI.  HENT: Negative for congestion.   Eyes: Negative for discharge and redness.  Respiratory: Negative for cough and shortness of  breath.   Cardiovascular: Negative for chest pain.  Gastrointestinal: Negative for vomiting and abdominal pain.  Musculoskeletal: Negative for back pain.  Skin: Negative for rash.  Neurological: Positive for dizziness, weakness and headaches. Negative for syncope and numbness.  Psychiatric/Behavioral: Positive for confusion.       No behavior change.    Physical Exam  BP 166/94  Pulse 98  Temp(Src) 98.4 F (36.9 C) (Tympanic)  Ht 5\' 8"  (1.727 m)  Wt 236 lb (107.049 kg)  BMI 35.88 kg/m2  SpO2 100%  Physical Exam  Nursing note and vitals reviewed. Constitutional:       Awake, alert, nontoxic appearance with baseline speech for patient.  HENT:  Head: Atraumatic.  Mouth/Throat: No oropharyngeal exudate.  Eyes: EOM are normal. Pupils are equal, round, and reactive to light. Right eye exhibits no discharge. Left eye exhibits no discharge.  Neck: Neck supple.  Cardiovascular: Normal rate and regular rhythm.   No murmur heard. Pulmonary/Chest: Effort normal and breath sounds normal. No stridor. No respiratory distress. He has no wheezes. He has no rales. He exhibits no tenderness.  Abdominal: Soft. Bowel sounds are normal. He exhibits no mass. There is no tenderness. There is no rebound.  Musculoskeletal: He exhibits no tenderness.       Baseline ROM, moves extremities with no obvious new focal weakness.  Lymphadenopathy:    He has no cervical adenopathy.  Neurological:       Awake but tired and falls asleep easily, cooperative and aware of situation; oriented to person and town but slow to answer  questions, knows it's Sunday, motor strength bilaterally 3/5; sensation normal to light touch bilaterally; peripheral visual fields full to confrontation; no facial asymmetry; tongue midline; major cranial nerves appear intact; no pronator drift, abnormal finger to nose bilaterally,   Skin: No rash noted.  Psychiatric: He has a normal mood and affect.    ED Course  Procedures  Date:  08/10/2010  Rate: 90  Rhythm: normal sinus rhythm  QRS Axis: left  Intervals: RBBB  ST/T Wave abnormalities: nonspecific T wave changes  Conduction Disutrbances:right bundle branch block and left anterior fascicular block  Narrative Interpretation:   Old EKG Reviewed: unchanged   Results for orders placed during the hospital encounter of 08/10/10  PROTIME-INR      Component Value Range   Prothrombin Time 13.5  11.6 - 15.2 (seconds)   INR 1.01  0.00 - 1.49   APTT      Component Value Range   aPTT 23 (*) 24 - 37 (seconds)  CBC      Component Value Range   WBC 11.1 (*) 4.0 - 10.5 (K/uL)   RBC 4.13 (*) 4.22 - 5.81 (MIL/uL)   Hemoglobin 14.0  13.0 - 17.0 (g/dL)   HCT 16.1  09.6 - 04.5 (%)   MCV 100.5 (*) 78.0 - 100.0 (fL)   MCH 33.9  26.0 - 34.0 (pg)   MCHC 33.7  30.0 - 36.0 (g/dL)   RDW 40.9  81.1 - 91.4 (%)   Platelets 134 (*) 150 - 400 (K/uL)  DIFFERENTIAL      Component Value Range   Neutrophils Relative 68  43 - 77 (%)   Neutro Abs 7.6  1.7 - 7.7 (K/uL)   Lymphocytes Relative 20  12 - 46 (%)   Lymphs Abs 2.3  0.7 - 4.0 (K/uL)   Monocytes Relative 10  3 - 12 (%)   Monocytes Absolute 1.1 (*) 0.1 - 1.0 (K/uL)   Eosinophils Relative 1  0 - 5 (%)   Eosinophils Absolute 0.1  0.0 - 0.7 (K/uL)   Basophils Relative 0  0 - 1 (%)   Basophils Absolute 0.0  0.0 - 0.1 (K/uL)  COMPREHENSIVE METABOLIC PANEL      Component Value Range   Sodium 139  135 - 145 (mEq/L)   Potassium 3.6  3.5 - 5.1 (mEq/L)   Chloride 106  96 - 112 (mEq/L)   CO2 25  19 - 32 (mEq/L)   Glucose, Bld 125 (*) 70 - 99 (mg/dL)   BUN 17  6 - 23 (mg/dL)   Creatinine, Ser 7.82  0.50 - 1.35 (mg/dL)   Calcium 9.0  8.4 - 95.6 (mg/dL)   Total Protein 6.1  6.0 - 8.3 (g/dL)   Albumin 2.9 (*) 3.5 - 5.2 (g/dL)   AST 17  0 - 37 (U/L)   ALT 17  0 - 53 (U/L)   Alkaline Phosphatase 71  39 - 117 (U/L)   Total Bilirubin 0.4  0.3 - 1.2 (mg/dL)   GFR calc non Af Amer >60  >60 (mL/min)   GFR calc Af Amer >60  >60 (mL/min)    CK TOTAL AND CKMB      Component Value Range   Total CK 76  7 - 232 (U/L)   CK, MB 7.2 (*) 0.3 - 4.0 (ng/mL)   Relative Index RELATIVE INDEX IS INVALID  0.0 - 2.5   TROPONIN I      Component Value Range   Troponin I <0.30  <0.30 (ng/mL)  GLUCOSE, CAPILLARY      Component Value Range   Glucose-Capillary 136 (*) 70 - 99 (mg/dL)    Ct Head Wo Contrast  08/10/2010  *RADIOLOGY REPORT*  Clinical Data: Code stroke, altered level of consciousness, vertigo  CT HEAD WITHOUT CONTRAST  Technique:  Contiguous axial images were obtained from the base of the skull through the vertex without contrast.  Comparison: CT brain scan of 09/11/2009  Findings: The ventricular system is prominent as are the cortical sulci, indicative of diffuse atrophy.  The septum is midline in position.  The fourth ventricle and basilar cisterns are stable. Mild to moderate small vessel ischemic change throughout the periventricular white matter is stable.  No hemorrhage, mass lesion, or acute infarction is seen.  On bone window images, no acute calvarial abnormality is seen.  There is fluid within the mastoid air cells noted on the right.  IMPRESSION:  1.  Atrophy and small vessel ischemic change. 2.  No acute intracranial abnormality. 3.  Fluid in the right mastoid air cells  I discussed the findings of this study with Dr. Fonnie Jarvis at 5:14 p.m. on 08/10/2010  Original Report Authenticated By: Juline Patch, M.D.   MDM The patient appears reasonably stabilized for transfer considering the current resources, flow, and capabilities available in the ED at this time, and I doubt any other [facility] requiring further screening and/or treatment in the ED prior to transfer.       Hurman Horn, MD 08/12/10 [id number]

## 2010-08-11 ENCOUNTER — Inpatient Hospital Stay (HOSPITAL_COMMUNITY): Payer: Medicare Other

## 2010-08-11 DIAGNOSIS — G459 Transient cerebral ischemic attack, unspecified: Secondary | ICD-10-CM

## 2010-08-11 LAB — CBC
Hemoglobin: 13.6 g/dL (ref 13.0–17.0)
MCH: 33.5 pg (ref 26.0–34.0)
MCV: 99.3 fL (ref 78.0–100.0)
RBC: 4.06 MIL/uL — ABNORMAL LOW (ref 4.22–5.81)
WBC: 11.5 10*3/uL — ABNORMAL HIGH (ref 4.0–10.5)

## 2010-08-11 LAB — DIFFERENTIAL
Lymphocytes Relative: 9 % — ABNORMAL LOW (ref 12–46)
Lymphs Abs: 1 10*3/uL (ref 0.7–4.0)
Monocytes Relative: 6 % (ref 3–12)
Neutro Abs: 9.8 10*3/uL — ABNORMAL HIGH (ref 1.7–7.7)
Neutrophils Relative %: 85 % — ABNORMAL HIGH (ref 43–77)

## 2010-08-11 LAB — URINALYSIS, ROUTINE W REFLEX MICROSCOPIC
Glucose, UA: NEGATIVE mg/dL
Hgb urine dipstick: NEGATIVE
Ketones, ur: NEGATIVE mg/dL
Protein, ur: NEGATIVE mg/dL
Urobilinogen, UA: 0.2 mg/dL (ref 0.0–1.0)

## 2010-08-11 LAB — COMPREHENSIVE METABOLIC PANEL
ALT: 16 U/L (ref 0–53)
AST: 13 U/L (ref 0–37)
CO2: 26 mEq/L (ref 19–32)
Calcium: 8.6 mg/dL (ref 8.4–10.5)
Chloride: 106 mEq/L (ref 96–112)
Creatinine, Ser: 0.89 mg/dL (ref 0.50–1.35)
GFR calc Af Amer: >60 mL/min (ref >60)
GFR calc non Af Amer: >60 mL/min (ref >60)
Glucose, Bld: 143 mg/dL — ABNORMAL HIGH (ref 70–99)
Total Bilirubin: 0.4 mg/dL (ref 0.3–1.2)

## 2010-08-11 LAB — LIPID PANEL
HDL: 32 mg/dL — ABNORMAL LOW (ref >39)
LDL Cholesterol: 105 mg/dL — ABNORMAL HIGH (ref 0–99)
VLDL: 26 mg/dL (ref 0–40)

## 2010-08-11 LAB — HEMOGLOBIN A1C: Mean Plasma Glucose: 140 mg/dL — ABNORMAL HIGH (ref <117)

## 2010-08-11 LAB — GLUCOSE, CAPILLARY
Glucose-Capillary: 158 mg/dL — ABNORMAL HIGH (ref 70–99)
Glucose-Capillary: 143 mg/dL — ABNORMAL HIGH (ref 70–99)
Glucose-Capillary: 134 mg/dL — ABNORMAL HIGH (ref 70–99)

## 2010-08-12 LAB — GLUCOSE, CAPILLARY
Glucose-Capillary: 94 mg/dL (ref 70–99)
Glucose-Capillary: 106 mg/dL — ABNORMAL HIGH (ref 70–99)
Glucose-Capillary: 95 mg/dL (ref 70–99)

## 2010-08-12 LAB — LIPID PANEL
Cholesterol: 175 mg/dL (ref 0–200)
Total CHOL/HDL Ratio: 5.5 RATIO

## 2010-08-12 NOTE — H&P (Signed)
NAMEANTONIN, [name]               ACCOUNT NO.:  [id number]  MEDICAL RECORD NO.:  [id number]  LOCATION:  [facility]                         FACILITY:  [facility]  PHYSICIAN:  Eduard Clos, MDDATE OF BIRTH:  11/10/1931  DATE OF ADMISSION:  08/10/2010 DATE OF DISCHARGE:                             HISTORY & PHYSICAL   PRIMARY CARE PHYSICIAN:  Kirk Ruths, MD  CHIEF COMPLAINT:  Left-sided facial numbness, confusion, and dizziness.  HISTORY OF PRESENTING ILLNESS:  A 77 year old male with known history of hypertension, gout who was recently treated for a gouty exacerbation. He is on prednisone.  Around 2 o'clock today in the afternoon, was trying to help his wife out of the potty chair and suddenly felt weak and was unable to move and had left facial numbness and felt dizzy, did not lose consciousness, had some brief episode of headache, and had to sit on the chair because he was about to fall.  Eventually he became more confused and EMS was called.  The patient was needing 2 people to make him walk to the EMS Zenaida Niece, was brought to [facility] from where he had a CT head done.  Due to his symptoms, neurologist on-call was called and they agreed to transfer the patient to further evaluation to [facility] and at this time Dr. Daphine Deutscher of Neurology has already seen the patient as the patient's symptoms are improving.  The patient was considered not a candidate for TPA but will be needing further workup.  The patient states that his left facial numbness still exists and his headache has completely resolved.  He was confused, which has resolved at this time.  He said he was feeling so weak but his symptoms improved. Neurologist also felt that the patient had some right-sided weakness and right-sided facial droop.  The patient denies any chest pain, shortness of breath, nausea, vomiting, abdominal pain.  Denies any dysuria, discharge, diarrhea. Denies any visual  symptoms, any difficulty speaking or swallowing though he has failed his swallow evaluation at this time.  PAST MEDICAL HISTORY:  Hypertension, gout, history of corneal transplant.  FAMILY HISTORY:  Positive for diabetes mellitus type 2, stroke.  SOCIAL HISTORY:  The patient is married, lives with his wife.  Denies smoking cigarettes, drinking alcohol, or using drugs.  ALLERGIES:  TETANUS TOXOID.  MEDICATIONS PRIOR TO ADMISSION:  He states he takes allopurinol and a blood pressure pill and he takes a baby aspirin.  REVIEW OF SYSTEMS:  As per history of presenting illness, nothing else significant.  PHYSICAL EXAMINATION:  GENERAL:  The patient examined at bedside, not in acute distress. VITAL SIGNS:  Blood pressure is 150/80, pulse is 80 per minute, temperature 97.8, respirations 18, O2 sat 100%. HEENT:  Anicteric.  No pallor.  At this time, I do not see a definite facial symmetry.  Tongue is midline.  No neck rigidity.  No discharge from ears, eyes, nose, or mouth.  PERRLA positive.  The patient is able to count in both eyes.  He is moving his eyes horizontally and vertically without any difficulty. CHEST:  Bilateral air entry present.  No rhonchi, no crepitation. HEART:  S1, S2 heard.  ABDOMEN:  Soft, nontender.  Bowel sounds heard. CNS:  The patient is alert, awake, oriented to time, person, and place. He is able to move both upper and lower extremities, 5/5.  I do not see any pronator drift, dysdiadochokinesia, or ataxia. EXTREMITIES:  There is tenderness and a small tophi, swelling in the left second toe which is tender but no active discharge.  Peripheral pulses felt.  No edema.  LABORATORY DATA:  EKG shows normal sinus rhythm with right bundle-branch block, heart rate of 90 beats per minute with nonspecific ST-T changes. CT of the head without contrast shows atrophia and small vessel ischemic change.  No acute intracranial abnormality.  Fluid in the right mastoid air  cells.  CBC:  WBC is 11.1, hemoglobin is 14, hematocrit is 41.5, platelets 134,000.  PT/INR 13.5 and 1.  Basic metabolic panel:  Sodium 139, potassium is 3.6, chloride 106, carbon dioxide 25, glucose 125, BUN 17, creatinine 1.1, total bilirubin is 0.4, alkaline phosphatase 71, AST 70, ALT 17, total protein 6.1, albumin 2.9, calcium 9, CK 76, MB is 7.2, troponin less than 0.3.  ASSESSMENT: 1. Possible cerebrovascular accident versus transient ischemic attack. 2. History of hypertension. 3. History of gout. 4. Thrombocytopenia.  PLAN: 1. At this time admit the patient to telemetry. 2. For his possible CVA versus TIA at this time neurologist Dr. Daphine Deutscher     has already assessed the patient.  I did discuss with Dr. Daphine Deutscher.     The patient will be on aspirin.  We are going to get MRI/MRA of the     brain, carotid Doppler, 2-D echo.  The patient will be on neuro     checks.  The patient has failed swallow, we will beginning speech     therapy evaluation and diet plus speech therapy, get PT/OT consults     and further recommendation based on all these tests and consult. 3. The patient has a mild leukocytosis.  We will get a urinalysis and     also a chest x-ray portable.  The patient states that he had a     recent gouty flare-up for which he is on steroids.  I am going to     keep the patient on stress dose steroids, hydrocortisone and     increase up to 3 doses after which he can take his p.o. prednisone,     we will continue that. 4. We need to verify his home medication. 5. History of hypertension.  We need to verify his home medication.     At this time, we will keep the patient on p.r.n. labetalol for     systolic blood pressure more than 200. 6. The patient does have mild tenderness in his left second toe which     could be gouty tophi.  I am just getting an x-ray to make sure     there is no underlying osteomyelitis. 7. Further recommendation as condition evolves.     Eduard Clos, MD     ANK/MEDQ  D:  08/10/2010  T:  08/10/2010  Job:  [id number]  cc:   Kirk Ruths, M.D.  Electronically Signed by Midge Minium MD on 08/12/2010 07:27:10 AM

## 2010-08-13 LAB — GLUCOSE, CAPILLARY
Glucose-Capillary: 100 mg/dL — ABNORMAL HIGH (ref 70–99)
Glucose-Capillary: 93 mg/dL (ref 70–99)

## 2010-08-17 NOTE — Discharge Summary (Signed)
[name], [name]               ACCOUNT NO.:  [id number]  MEDICAL RECORD NO.:  [id number]  LOCATION:  3008                         FACILITY:  [facility]  PHYSICIAN:  Isidor Holts, M.D.  DATE OF BIRTH:  1931-08-29  DATE OF ADMISSION:  08/10/2010 DATE OF DISCHARGE:  08/13/2010                              DISCHARGE SUMMARY   PRIMARY MD:  Kirk Ruths, MD  DISCHARGE DIAGNOSES: 1. Transient ischemic attack. 2. Hypertension. 3. Dyslipidemia. 4. Gout. 5. Diet-controlled type 2 diabetes mellitus, per ADA criteria. 6. History of corneal transplant.  DISCHARGE MEDICATIONS:   1. Norvasc 5 mg p.o. daily. 2. Zocor 20 mg p.o. nightly. 3. Enteric-coated aspirin 325 mg p.o. daily (81 mg p.o. daily). 4. Allopurinol 300 mg p.o. q.a.m. 5. Calcium carbonate/vitamin D OTC 1 tablet p.o. daily. 6. Cod liver oil OTC 2 tablets p.o. daily. 7. Colchicine 0.6 mg p.o. b.i.d. 8. Fish oil 1 gram p.o. daily. 9. Flonase nasal spray (90 mcg) 1 spray each nostril daily p.r.n. for     allergies. 10. Metoprolol tartrate 50 mg p.o. b.i.d. 11.Sodium chloride ophthalmic OTC 1 drop right eye daily. 12.Muro-128 ointment OTC 1 application right eye nightly. 13.Prednisone Dosepak utilize as instructed. 14.Vitamin C 2000 mg 2 tablets p.o. daily. 15.Vitamin E 400 international units p.o. daily.  PROCEDURES: 1. Head CT scan August 10, 2010.  This showed atrophy and small vessel     ischemic change.  No acute intracranial abnormality.  There was     fluid in the right mastoid air cells. 2. X-ray chest August 10, 2009.  This showed bibasilar scarring.  No     acute findings. 3. Brain MRI August 11, 2010.  This showed atrophy and small vessel     disease.  No acute intracranial findings. 4. Brain MRA August 11, 2010.  This showed intracranial atherosclerotic     disease, proximal flow-reducing lesion is observed. 5. A 2-D echocardiogram August 11, 2010.  This showed normal left     ventricular cavity size, mild  concentric hypertrophy, systolic     function was normal, EF 55%-60%.  There was dyskinesis of the basal     inferior myocardium.  No cardiac source of emboli identified. 6. Carotid/vertebral artery duplex August 11, 2010.  This showed no     significant extracranial carotid artery stenosis.  Vertebrals were     patent with antegrade flow. 7. Transcranial Doppler August 11, 2010.  This demonstrated suboptimal     window for segment examination.  There were normal mean flow     velocities identified in vessels of anterior and posterior cerebral     circulation.  Globally elevated pulsatility indices, suggest     diffuse intracranial atherosclerosis.  CONSULTATION:  Gillie Manners, MD, neurologist.  ADMISSION HISTORY:  As in H and P notes of August 10, 2010, dictated by Dr. Midge [name].  However, in brief, this is a 77 year old male with known history of gout, hypertension, status post corneal transplant, diet-controlled type 2 diabetes mellitus, presenting with a transient episode of left facial numbness, dizziness, brief headache at about 2:00 p.m. on afternoon of presentation, while trying to help his wife out of the [facility].  EMS was called and he was brought to the emergency department, where he was initially evaluated by Dr. Daphine [name], neurologist, and as the patient's symptoms were improving, the patient was not considered a candidate for t-PA.  He was admitted for further evaluation, investigation, and management.  CLINICAL COURSE: 1. TIA.  The patient presented as described above.  He was admitted     for neuro and telemetry observation.  Brain imaging studies showed     no evidence of acute CVA.  Vascular studies showed no evidence of     concerning stenoses, although he did have widespread intracranial     atherosclerotic disease.  Over the course of his hospitalization,     his symptoms resolved.  The patient has been evaluated by PT/OT,     and it is felt that the patient  will perhaps benefit from home health PT.  2. Hypertension.  The patient's blood pressure was reasonable for the     most part of his hospitalization, but was found to be somewhat     elevated at 156/85 mmHg on August 13, 2010.  Low-dose Norvasc has     been added to his preadmission antihypertensive medication.  3. Gout.  The patient has recently been treated for an acute gouty     exacerbation and is currently on a tapering course of prednisone.     This did not prove problematic during the course of his     hospitalization.  He will complete his steroid taper as prescribed     by his primary MD  4. Dyslipidemia.  The patient's lipid profile done as part of risk     factor modification was as follows:  Total cholesterol 179,     triglyceride 161, HDL 32, LDL 111.  Target LDL should be less than     70.  The patient has been started on statin treatment accordingly.  5. Diet-controlled type 2 diabetes mellitus.  The patient informs me     that his primary MD is already aware that he has "borderline     diabetes" and he is currently under surveillance for this.  During     this hospitalization, his HbA1c was found to be 6.5, which meets     the ADA criteria for diagnosis of type 2 diabetes mellitus.  The     patient has been commenced on appropriate diet accordingly.  His     CBGs on August 13, 2010, were between 93-100.  We shall defer     timing of commencement of oral hypoglycemic medications, to the     patient's primary MD at his own discretion.  DISPOSITION:  The patient was asymptomatic, considered clinically stable for discharge today.  He was therefore discharged accordingly.  ACTIVITY:  As tolerated.  Recommended to increase activity slowly.  DIET:  Heart-healthy/carbohydrate modified.  FOLLOWUP INSTRUCTIONS:  The patient will follow up with his primary MD, Dr. Karleen [name], within 1-2 weeks.  In addition, he is to follow up with neurologist, Dr. Delia [name] in 2 months,  telephone number [phone] at [address],  [address] [place], [place].  All this has been communicated to the patient.  He has verbalized understanding.     Isidor Holts, M.D.     CO/MEDQ  D:  08/13/2010  T:  08/14/2010  Job:  [id number]  cc:   Kirk Ruths, M.D. Pramod P. Pearlean Brownie, MD  Electronically Signed by Isidor Holts M.D. on 08/17/2010 06:04:11 PM

## 2010-09-07 NOTE — Consult Note (Signed)
NAMEBENSON, PORCARO               ACCOUNT NO.:  [id number]  MEDICAL RECORD NO.:  [id number]  LOCATION:  3008                         FACILITY:  [facility]  PHYSICIAN:  Gillie Manners, MD       DATE OF BIRTH:  09-Jun-1931  DATE OF CONSULTATION:  08/10/2010 DATE OF DISCHARGE:                                CONSULTATION   CONSULTING PHYSICIAN:  Medicine Service.  REASON FOR CONSULTATION:  Possible acute stroke.  HISTORY:  This is a 77 year old gentleman with a history of hypertension, who states at about 2 p.m. today he had a sudden onset of unsteady, and when I asked if this was a vertigo he denies this, he states he felt dizzy, but this resolved.  With him at the bedside currently is his brother and his son have stated at that time the patient could not speak clearly and had altered mentation.  He had no witnessed seizure activity.  Has no history of seizures.  He went to [facility] with a vertigo that was acute and transported him here for neurologic evaluation and admission.  As he said at 2 p.m. when the symptoms started after he was picking up his wife from a chair she was disabled in some fashion and required assistance at home.  It is noted that he could not walk after this onset of symptoms, but the patient himself denies any lateralized deficits, trouble speaking, swallowing, double vision.  He did have a wall few days, since he tripped over his brush hog at home, he suffered no trauma.  Currently, he denies any headache, vertigo, nausea, vomiting, trouble speaking, swallowing, lateralized symptoms.  His brother and his son stated that the patient is better now.  He is speaking clearly.  His clear sensorium is vertigo.  He is much more attentive than he was earlier today.  PAST MEDICAL HISTORY AND PAST SURGICAL HISTORY:  As above, also includes gout, hypertension, questionable stroke, and corneal transplant, unclear side.  PAST SURGICAL HISTORY:  As  above.  ALLERGIES:  No known drug allergies.  MEDICATIONS AT HOME:  Include allopurinol, aspirin, blood pressure medications he does not know the name of.  SOCIAL HISTORY:  He does chew tobacco.  He denies alcohol or drug use. Lives at home with his wife.  FAMILY HISTORY:  Noncontributory from neurologic illness.  REVIEW OF SYSTEMS:  As above, otherwise, also negative.  PHYSICAL EXAMINATION:  ADMISSION VITAL SIGNS:  Pulse 81, blood pressure 137/76. GENERAL APPEARANCE:  He is a well-developed white male, appears his stated age, lying at 45 degrees in a stretcher, in no acute distress. HEENT:  Normocephalic and atraumatic.  Anicteric sclerae.  Clear oropharynx. NECK:  No carotid bruits or JVD. CARDIOVASCULAR:  Regular rate and rhythm.  No murmurs, rubs, or gallops. LUNGS:  Clear to auscultation bilaterally.  No wheezes, or rales. ABDOMEN:  Soft, nontender, nondistended.  Normoactive bowel sounds. EXTREMITIES:  No cyanosis, clubbing, or edema. SKIN:  No lesions or rashes.  He does have some excoriations in the lower extremities that are in various stages of healing. MENTAL STATUS EXAM:  He is awake, but mildly somnolent.  He arouses with easily verbal stimuli.  Maintains attention.  Follows all commands.  No agitations.  No hallucinations.  Recognizes the family without difficulty. NEUROLOGIC:  Speech, language, and listening to comprehension.  No dysarthria.  Cranial nerves reveal the following findings:  Pupils are 2.5 mm bilaterally.  No gaze preference.  No nystagmus.  There is some question of incomplete abduction on the right with right gaze.  He does have a right facial droop, this appears to be in upper motor neuron VII deficits.  He also has decrease sensation to pinprick on right side of the face and  over the 5th cranial nerve, normal tongue and palate movement.  Normal shoulder shrug.  Motor examination is 4+/5 in the right arm and right leg, and 5/5 in the left arm  and leg proximal and distally.  Reflexes are 2+ in left  and 3+ on the right upper lower extremity.  Cerebellar exam, decreased finger-to-nose on the left compared to the right.  There were some dyssynergy on the right, but this is subtle.  He exhibits no tremor and no voluntary movements. Sensation, decreased pinprick on the right arm and leg compared to the left.  Gait was not tested.  I sat the patient up and he did not have any nystagmus or reproducible symptoms with positional change.  LABS AND STUDIES:  CT brain done today revealed atrophy and small-vessel changes.  No acute endocrine abnormality.  White count 7.1, hemoglobin 14.0, platelets 134.  Sodium is 139, potassium 3.6, BUN is 17, creatinine 1.10, glucose is 125.  Albumin is 2.9, total protein 6.1, CK 76, troponin is less than 0.3.  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  ASSESSMENT/PLAN:  This is a 77 year old gentleman with sudden onset of vertigo and ataxia at 2 p.m. today.  Given this characters of symptoms and his current examination findings, I suspected a  vertebrobasilar distribution ischemic stroke.  Given his examination findings, I suspect a probable left pontocerebellar event. However, additional possibilities include a left thalamic stroke or medullary involvement.  He is symptomatically improving.  He has some mild right hemiparesis and hemisensory loss as well as left hemiataxia remaining. The vertigo, altered mentation and questionable alteration speech have resolved.  He is not a tPA candidate due to the patient being out of the therapeutic window as far as time and he has resolving symptoms. 1. Admit to Neuro Tele. 2. Restart enteric coated aspirin 325 daily. 3. Check a fasting lipid profile. 4. Keep his systolic blood pressure less than 185, diastolic blood     pressure less than 105.  P.r.n. labetalol is okay. 5. PT, OT  and ST consult. 6. MRI and MRA brain are pending.  We will follow the patient.  Thank you for your consult.          ______________________________ Gillie Manners, MD     JM/MEDQ  D:  08/10/2010  T:  08/11/2010  Job:  [id number]  Electronically Signed by Gillie Manners MD on 09/07/2010 07:45:25 PM

## 2010-09-23 ENCOUNTER — Ambulatory Visit (HOSPITAL_COMMUNITY)
Admission: RE | Admit: 2010-09-23 | Discharge: 2010-09-23 | Disposition: A | Discharge status: Home / Self Care | Payer: Medicare Other | Source: Ambulatory Visit | Attending: Internal Medicine | Admitting: Internal Medicine

## 2010-09-23 ENCOUNTER — Other Ambulatory Visit (HOSPITAL_COMMUNITY): Payer: Self-pay | Admitting: Internal Medicine

## 2010-09-23 DIAGNOSIS — R05 Cough: Secondary | ICD-10-CM | POA: Insufficient documentation

## 2010-09-23 DIAGNOSIS — R059 Cough, unspecified: Secondary | ICD-10-CM | POA: Insufficient documentation

## 2010-09-23 DIAGNOSIS — I10 Essential (primary) hypertension: Secondary | ICD-10-CM

## 2010-09-23 DIAGNOSIS — E119 Type 2 diabetes mellitus without complications: Secondary | ICD-10-CM

## 2010-09-23 DIAGNOSIS — R0602 Shortness of breath: Secondary | ICD-10-CM | POA: Insufficient documentation

## 2010-09-23 DIAGNOSIS — R079 Chest pain, unspecified: Secondary | ICD-10-CM

## 2010-11-11 LAB — DIFFERENTIAL
Basophils Relative: 0
Eosinophils Absolute: 0.1
Eosinophils Relative: 1
Lymphs Abs: 1.6
Neutrophils Relative %: 64

## 2010-11-11 LAB — POCT CARDIAC MARKERS
Myoglobin, poc: 91.4
Operator id: 207241

## 2010-11-11 LAB — CARDIAC PANEL(CRET KIN+CKTOT+MB+TROPI)
CK, MB: 4
Relative Index: INVALID
Total CK: 35
Troponin I: 0.04

## 2010-11-11 LAB — BASIC METABOLIC PANEL
BUN: 18
CO2: 26
Chloride: 110
Creatinine, Ser: 1
Glucose, Bld: 138 — ABNORMAL HIGH
Potassium: 3.4 — ABNORMAL LOW

## 2010-11-11 LAB — LIPID PANEL
Cholesterol: 143
HDL: 30 — ABNORMAL LOW
LDL Cholesterol: 97

## 2010-11-11 LAB — CBC
HCT: 38.3 — ABNORMAL LOW
MCHC: 33.9
MCV: 95.6
Platelets: 151
RDW: 12.9
WBC: 7

## 2010-11-27 DIAGNOSIS — I80202 Phlebitis and thrombophlebitis of unspecified deep vessels of left lower extremity: Secondary | ICD-10-CM

## 2010-11-27 HISTORY — DX: Phlebitis and thrombophlebitis of unspecified deep vessels of left lower extremity: I80.202

## 2010-12-07 ENCOUNTER — Emergency Department (HOSPITAL_COMMUNITY): Payer: Medicare Other

## 2010-12-07 ENCOUNTER — Emergency Department (HOSPITAL_COMMUNITY)
Admission: EM | Admit: 2010-12-07 | Discharge: 2010-12-07 | Disposition: A | Discharge status: Other Acute Inpatient Hospital | Payer: Medicare Other | Source: Home / Self Care

## 2010-12-07 ENCOUNTER — Other Ambulatory Visit: Payer: Self-pay

## 2010-12-07 ENCOUNTER — Inpatient Hospital Stay (HOSPITAL_COMMUNITY)
Admission: EM | Admit: 2010-12-07 | Discharge: 2010-12-11 | DRG: 470 | Disposition: A | Discharge status: Skilled Nursing Facility | Payer: Medicare Other | Attending: Orthopedic Surgery | Admitting: Orthopedic Surgery

## 2010-12-07 ENCOUNTER — Encounter (HOSPITAL_COMMUNITY): Payer: Self-pay | Admitting: *Deleted

## 2010-12-07 ENCOUNTER — Encounter (HOSPITAL_COMMUNITY): Payer: Self-pay | Admitting: Nurse Practitioner

## 2010-12-07 DIAGNOSIS — M109 Gout, unspecified: Secondary | ICD-10-CM | POA: Diagnosis present

## 2010-12-07 DIAGNOSIS — M25559 Pain in unspecified hip: Secondary | ICD-10-CM | POA: Insufficient documentation

## 2010-12-07 DIAGNOSIS — E876 Hypokalemia: Secondary | ICD-10-CM | POA: Insufficient documentation

## 2010-12-07 DIAGNOSIS — E7800 Pure hypercholesterolemia, unspecified: Secondary | ICD-10-CM | POA: Diagnosis present

## 2010-12-07 DIAGNOSIS — Z87891 Personal history of nicotine dependence: Secondary | ICD-10-CM

## 2010-12-07 DIAGNOSIS — IMO0002 Reserved for concepts with insufficient information to code with codable children: Secondary | ICD-10-CM

## 2010-12-07 DIAGNOSIS — S72009A Fracture of unspecified part of neck of unspecified femur, initial encounter for closed fracture: Secondary | ICD-10-CM | POA: Insufficient documentation

## 2010-12-07 DIAGNOSIS — Z79899 Other long term (current) drug therapy: Secondary | ICD-10-CM

## 2010-12-07 DIAGNOSIS — W010XXA Fall on same level from slipping, tripping and stumbling without subsequent striking against object, initial encounter: Secondary | ICD-10-CM | POA: Insufficient documentation

## 2010-12-07 DIAGNOSIS — Y92009 Unspecified place in unspecified non-institutional (private) residence as the place of occurrence of the external cause: Secondary | ICD-10-CM | POA: Insufficient documentation

## 2010-12-07 DIAGNOSIS — I10 Essential (primary) hypertension: Secondary | ICD-10-CM | POA: Insufficient documentation

## 2010-12-07 DIAGNOSIS — R52 Pain, unspecified: Secondary | ICD-10-CM | POA: Insufficient documentation

## 2010-12-07 DIAGNOSIS — W19XXXA Unspecified fall, initial encounter: Secondary | ICD-10-CM | POA: Diagnosis present

## 2010-12-07 DIAGNOSIS — Z8673 Personal history of transient ischemic attack (TIA), and cerebral infarction without residual deficits: Secondary | ICD-10-CM

## 2010-12-07 DIAGNOSIS — Z7982 Long term (current) use of aspirin: Secondary | ICD-10-CM | POA: Insufficient documentation

## 2010-12-07 DIAGNOSIS — I4510 Unspecified right bundle-branch block: Secondary | ICD-10-CM | POA: Insufficient documentation

## 2010-12-07 LAB — CBC
HCT: 40.7 % (ref 39.0–52.0)
Hemoglobin: 13.7 g/dL (ref 13.0–17.0)
MCH: 33.7 pg (ref 26.0–34.0)
MCHC: 33.7 g/dL (ref 30.0–36.0)
MCV: 100.2 fL — ABNORMAL HIGH (ref 78.0–100.0)
RBC: 4.06 MIL/uL — ABNORMAL LOW (ref 4.22–5.81)

## 2010-12-07 LAB — COMPREHENSIVE METABOLIC PANEL
Albumin: 3.8 g/dL (ref 3.5–5.2)
Alkaline Phosphatase: 69 U/L (ref 39–117)
BUN: 18 mg/dL (ref 6–23)
Creatinine, Ser: 1.19 mg/dL (ref 0.50–1.35)
GFR calc Af Amer: 66 mL/min — ABNORMAL LOW (ref >90)
Glucose, Bld: 118 mg/dL — ABNORMAL HIGH (ref 70–99)
Total Protein: 7.1 g/dL (ref 6.0–8.3)

## 2010-12-07 LAB — DIFFERENTIAL
Basophils Relative: 0 % (ref 0–1)
Eosinophils Absolute: 0.1 10*3/uL (ref 0.0–0.7)
Eosinophils Relative: 1 % (ref 0–5)
Lymphs Abs: 1.6 10*3/uL (ref 0.7–4.0)
Monocytes Absolute: 0.7 10*3/uL (ref 0.1–1.0)
Monocytes Relative: 6 % (ref 3–12)

## 2010-12-07 LAB — PROTIME-INR
INR: 1 (ref 0.00–1.49)
Prothrombin Time: 13.4 seconds (ref 11.6–15.2)

## 2010-12-07 LAB — TYPE AND SCREEN
ABO/RH(D): A NEG
Antibody Screen: NEGATIVE

## 2010-12-07 MED ORDER — SIMVASTATIN 20 MG PO TABS
20.0000 mg | ORAL_TABLET | Freq: Every day | ORAL | Status: DC
  Administered 2010-12-07 – 2010-12-10 (×3): 20 mg via ORAL
  Filled 2010-12-07 (×5): qty 1

## 2010-12-07 MED ORDER — PREDNISONE 10 MG PO TABS
10.0000 mg | ORAL_TABLET | Freq: Two times a day (BID) | ORAL | Status: DC
  Administered 2010-12-07 – 2010-12-11 (×7): 10 mg via ORAL
  Filled 2010-12-07 (×9): qty 1

## 2010-12-07 MED ORDER — METHOCARBAMOL 100 MG/ML IJ SOLN
500.0000 mg | Freq: Four times a day (QID) | INTRAVENOUS | Status: DC | PRN
  Administered 2010-12-07: 500 mg via INTRAVENOUS
  Filled 2010-12-07 (×3): qty 5

## 2010-12-07 MED ORDER — SODIUM CHLORIDE 0.9 % IV SOLN
Freq: Once | INTRAVENOUS | Status: AC
  Administered 2010-12-07: 16:00:00 via INTRAVENOUS

## 2010-12-07 MED ORDER — AMLODIPINE BESYLATE 5 MG PO TABS
5.0000 mg | ORAL_TABLET | Freq: Every day | ORAL | Status: DC
  Administered 2010-12-07 – 2010-12-11 (×5): 5 mg via ORAL
  Filled 2010-12-07 (×5): qty 1

## 2010-12-07 MED ORDER — HYDROCORTISONE SOD SUCCINATE 100 MG IJ SOLR
100.0000 mg | Freq: Three times a day (TID) | INTRAMUSCULAR | Status: DC
  Administered 2010-12-07 – 2010-12-09 (×4): 100 mg via INTRAVENOUS
  Filled 2010-12-07 (×8): qty 2

## 2010-12-07 MED ORDER — CLONIDINE HCL 0.1 MG PO TABS
0.1000 mg | ORAL_TABLET | Freq: Four times a day (QID) | ORAL | Status: DC | PRN
  Filled 2010-12-07: qty 1

## 2010-12-07 MED ORDER — FLUTICASONE PROPIONATE 50 MCG/ACT NA SUSP
2.0000 | Freq: Every day | NASAL | Status: DC
  Administered 2010-12-07 – 2010-12-11 (×4): 2 via NASAL
  Filled 2010-12-07: qty 16

## 2010-12-07 MED ORDER — METHOCARBAMOL 500 MG PO TABS: 500.0000 mg | ORAL_TABLET | Freq: Four times a day (QID) | ORAL | Status: DC | PRN

## 2010-12-07 MED ORDER — DEXTROSE-NACL 5-0.45 % IV SOLN
INTRAVENOUS | Status: DC
  Administered 2010-12-07 – 2010-12-08 (×2): via INTRAVENOUS

## 2010-12-07 MED ORDER — OXYCODONE-ACETAMINOPHEN 5-325 MG PO TABS
1.0000 | ORAL_TABLET | ORAL | Status: DC | PRN
  Filled 2010-12-07 (×3): qty 2

## 2010-12-07 MED ORDER — CEFAZOLIN SODIUM-DEXTROSE 2-3 GM-% IV SOLR
2.0000 g | INTRAVENOUS | Status: AC
  Administered 2010-12-08: 2 g via INTRAVENOUS
  Filled 2010-12-07: qty 50

## 2010-12-07 MED ORDER — MORPHINE SULFATE 2 MG/ML IJ SOLN
0.5000 mg | INTRAMUSCULAR | Status: DC | PRN
  Administered 2010-12-07 – 2010-12-08 (×2): 0.5 mg via INTRAVENOUS
  Filled 2010-12-07 (×2): qty 1

## 2010-12-07 MED ORDER — SODIUM CHLORIDE 0.9 % IV SOLN: 20.0000 mL | INTRAVENOUS | Status: DC

## 2010-12-07 MED ORDER — METOPROLOL TARTRATE 50 MG PO TABS
50.0000 mg | ORAL_TABLET | Freq: Two times a day (BID) | ORAL | Status: DC
  Administered 2010-12-07 – 2010-12-11 (×7): 50 mg via ORAL
  Filled 2010-12-07 (×3): qty 1
  Filled 2010-12-07 (×2): qty 2
  Filled 2010-12-07: qty 1
  Filled 2010-12-07: qty 2
  Filled 2010-12-07 (×2): qty 1

## 2010-12-07 MED ORDER — ONDANSETRON HCL 4 MG/2ML IJ SOLN
4.0000 mg | Freq: Once | INTRAMUSCULAR | Status: AC
  Administered 2010-12-07: 4 mg via INTRAVENOUS
  Filled 2010-12-07 (×2): qty 2

## 2010-12-07 MED ORDER — CLONIDINE HCL ER 0.1 MG PO TB12: 0.1000 mg | ORAL_TABLET | Freq: Four times a day (QID) | ORAL | Status: DC | PRN

## 2010-12-07 MED ORDER — FENTANYL CITRATE 0.05 MG/ML IJ SOLN
50.0000 ug | Freq: Once | INTRAMUSCULAR | Status: AC
  Administered 2010-12-07: 50 ug via INTRAVENOUS
  Filled 2010-12-07: qty 2

## 2010-12-07 MED ORDER — HYDROMORPHONE HCL PF 1 MG/ML IJ SOLN
1.0000 mg | Freq: Once | INTRAMUSCULAR | Status: AC
  Administered 2010-12-07: 1 mg via INTRAVENOUS
  Filled 2010-12-07: qty 1

## 2010-12-07 NOTE — ED Provider Notes (Signed)
History   This chart was scribed for Scott [name], MD by Clarita Crane. The patient was seen in room APA02/APA02 and the patient's care was started at 3:06PM.   CSN: [id number] Arrival date & time: 12/07/2010  2:50 PM   First MD Initiated Contact with Patient 12/07/10 1502      Chief Complaint  Patient presents with  . Hip Pain   HPI CON [name] is a 77 y.o. male who presents to the Emergency Department complaining of constant moderate to severe left hip pain secondary to a fall 1.5 hours ago in which patient tripped and fell while in the yard at home and persistent since. Denies head injury, neck pain, back pain, LOC, numbness, tingling. Patient with h/o hypertension, Gout and hyperlipidemia. Patient notes he is currently takes aspirin daily. Last ate 3 hours ago.  Pain worse with movement   Past Medical History  Diagnosis Date  . Hypertension   . Gout   . High cholesterol     History reviewed. No pertinent past surgical history.  History reviewed. No pertinent family history.  History  Substance Use Topics  . Smoking status: Former Games developer  . Smokeless tobacco: Current User  . Alcohol Use: No      Review of Systems 10 Systems reviewed and are negative for acute change except as noted in the HPI.  Allergies  Tetanus toxoids  Home Medications   Current Outpatient Rx  Name Route Sig Dispense Refill  . AMLODIPINE BESYLATE 5 MG PO TABS Oral Take 5 mg by mouth daily.      . ASPIRIN 325 MG PO TABS Oral Take 325 mg by mouth daily.      Marland Kitchen FLUTICASONE PROPIONATE 50 MCG/ACT NA SUSP Nasal Place 2 sprays into the nose daily.      Marland Kitchen METOPROLOL TARTRATE 50 MG PO TABS Oral Take 50 mg by mouth 2 (two) times daily.      Marland Kitchen PREDNISONE 10 MG PO TABS Oral Take 10 mg by mouth daily.      Marland Kitchen SIMVASTATIN 20 MG PO TABS Oral Take 20 mg by mouth daily.        BP 139/76  Pulse 79  Temp(Src) 98 F (36.7 C) (Oral)  Resp 22  Ht 5\' 11"  (1.803 m)  Wt 235 lb (106.595 kg)  BMI  32.78 kg/m2  SpO2 96%  Physical Exam CONSTITUTIONAL: Well developed/well nourished, Patient on LSB HEAD AND FACE: Normocephalic/atraumatic EYES: EOMI/PERRL ENMT: Mucous membranes moist NECK: supple no meningeal signs, C-spine non-tender SPINE:entire spine nontender, C-spine non-tender, No step-offs noted, no low back tenderness CV: S1/S2 noted, no murmurs/rubs/gallops noted LUNGS: Lungs are clear to auscultation bilaterally, no apparent distress, chest non-tender ABDOMEN: soft, nontender, no rebound or guarding NEURO: Pt is awake/alert, moves all extremitiesx4 EXTREMITIES: pulses normal, full ROM, DP and PT pulses intact bilaterally, Left knee tender to palpation, Pain with ROM left hip, distally neurovascularly intact in bilateral lower extremities.  SKIN: warm, color normal PSYCH: no abnormalities of mood noted  ED Course  Procedures  DIAGNOSTIC STUDIES: Oxygen Saturation is 96% on room air, normal by my interpretation.    COORDINATION OF CARE: 4:46PM- Patient informed of imaging results which show femoral neck fracture which will require surgical repair. Patient requests transfer to [facility].  5:00PM- Consult complete with Dr. Shon Baton. Patient case explained and discussed. Dr. Shon Baton agrees to accept patient to [facility] Emergency Department.  5:07PM- Patient informed he was accepted by Dr. Shon Baton at [facility] and will be transferred. Patient acknowledges this and agrees with plan set forth at this time.  D/w dr Judd Lien, on [facility], aware he will come to the ED Labs Reviewed  CBC - Abnormal; Notable for the following:    WBC 10.6 (*)    RBC 4.06 (*)    MCV 100.2 (*)    Platelets 135 (*)    All other components within normal limits  DIFFERENTIAL - Abnormal; Notable for the following:    Neutro Abs 8.2 (*)    All other components within normal limits  COMPREHENSIVE METABOLIC PANEL - Abnormal; Notable for the following:    Potassium 3.4 (*)    Glucose,  Bld 118 (*)    GFR calc non Af Amer 57 (*)    GFR calc Af Amer 66 (*)    All other components within normal limits  TYPE AND SCREEN   Dg Chest 1 View  12/07/2010  *RADIOLOGY REPORT*  Clinical Data: Fall, pain  CHEST - 1 VIEW  Comparison: Head CT 09/15/2010  Findings: Normal mediastinum and cardiac silhouette.  Normal pulmonary  vasculature.  No evidence of effusion, infiltrate, or pneumothorax.  No acute bony abnormality.  IMPRESSION: No acute cardiopulmonary process.  Original Report Authenticated By: Genevive Bi, M.D.   Dg Hip Complete Left  12/07/2010  *RADIOLOGY REPORT*  Clinical Data: Fall today, left hip pain  LEFT HIP - COMPLETE 2+ VIEW  Comparison: None.  Findings: There is a fracture of the left femoral neck which is basicervical versus sub capital.  There is varus angulation.  No joint is intact.  IMPRESSION:  Acute left femoral neck fracture.  Per CMS PQRS reporting requirements (PQRS Measure 24): Given the patient's age of greater than 50 and the fracture site (hip, distal radius, or spine), the patient should be tested for osteoporosis using DXA, and the appropriate treatment considered based on the DXA results.  Original Report Authenticated By: Genevive Bi, M.D.   Dg Knee 1-2 Views Left  12/07/2010  *RADIOLOGY REPORT*  Clinical Data: Pain, left hip fx  LEFT KNEE - 1-2 VIEW  Comparison: None  Findings: No evidence of fracture dislocation or the left knee. There is joint space narrowing of the medial and  lateral compartment .  IMPRESSION: No  fracture.  Original Report Authenticated By: Genevive Bi, M.D.     MDM  All labs/vitals reviewed and considered xrays reviewed and considered     Date: 12/07/2010  Rate: 76  Rhythm: normal sinus rhythm  QRS Axis: left  Intervals: normal  ST/T Wave abnormalities: nonspecific ST changes  Conduction Disutrbances:right bundle branch block  Narrative Interpretation:   Old EKG Reviewed: unchanged     I personally  performed the services described in this documentation, which was scribed in my presence. The recorded information has been reviewed and considered.      Scott [name], MD 12/07/10 [phone]

## 2010-12-07 NOTE — Consult Note (Signed)
Reason for Consult: Risk assessment and management of medical issues. Referring Physician: Dr. Annabell [name] is an 77 y.o. male.  HPI: [name] is a 77 year old male he states that he was working in the yard today. Patient states that the work was very exertional. He was moving a dog house and clearing leads. The patient states that he slipped on some ugly this and fell injuring his left hip.  The patient states that prior to this event he had been in his usual state of health. The patient denies any chest pain shortness of breath, wheeze, or cough. He likewise states that he has had a good appetite and has been eating and drinking normally in the days prior to this event. He denies fevers chills or nausea vomiting.  His primary complaint now is severe pain in his left hip.  Past Medical History  Diagnosis Date  . Hypertension   . Gout   . High cholesterol   TIA  Past Surgical History Corneal transplant.  Family medical history:  Positive for DM type 2 and stroke.  History reviewed. No pertinent past surgical history.  History reviewed. No pertinent family history.  Social History:  reports that he has quit smoking. He uses smokeless tobacco. He reports that he does not drink alcohol or use illicit drugs.  Medications: Prior to Admission:  (Not in a hospital admission) amLODipine (NORVASC) 5 MG tablet Take 5 mg by mouth daily. Alvy Beal Last Reconciliation Status: Reordered Taking?: Yes Ordered as: amLODipine (NORVASC) tablet 5 mg - 5 mg, Oral, Daily, First dose on Sun 12/07/10 at 2200 fluticasone (FLONASE) 50 MCG/ACT nasal spray Place 2 sprays into the nose daily. Alvy Beal Last Reconciliation Status: Reordered Taking?: Yes Ordered as: fluticasone (FLONASE) 50 MCG/ACT nasal spray 2 spray - 2 spray, Each Nare, Daily, First dose on Sun 12/07/10 at 2015 metoprolol (LOPRESSOR) 50 MG tablet Take 50 mg by mouth 2 (two) times daily. Alvy Beal Last Reconciliation  Status: Reordered Taking?: Yes Ordered as: metoprolol tartrate (LOPRESSOR) tablet 50 mg - 50 mg, Oral, 2 times daily, First dose on Sun 12/07/10 at 2200 predniSONE (DELTASONE) 10 MG tablet Take 10 mg by mouth 2 (two) times daily. Alvy Beal Last Reconciliation Status: Reordered Taking?: Yes Ordered as: predniSONE (DELTASONE) tablet 10 mg - 10 mg, Oral, 2 times daily, First dose on Sun 12/07/10 at 2200 simvastatin (ZOCOR) 20 MG tablet Take 20 mg by mouth daily. Alvy Beal Last Reconciliation Status: Reordered Taking?: Yes Ordered as: simvastatin (ZOCOR) tablet 20 mg - 20 mg, Oral, Daily, First dose on Sun 12/07/10 at 2015 Ascorbic Acid (VITAMIN C) 1000 MG tablet Take 2,000 mg by mouth daily. Alvy Beal Last Reconciliation Status: Discontinued Taking?: Yes aspirin 325 MG tablet Take 325 mg by mouth daily. Alvy Beal Last Reconciliation Status: Discontinued Taking?: Yes COD LIVER OIL PO Take 500 mg by mouth daily. Alvy Beal Last Reconciliation Status: Discontinued Taking?: Yes VITAMIN D, ERGOCALCIFEROL, PO Take 800 mg by mouth daily. Alvy Beal Last Reconciliation Status: Discontinued Taking?: Yes vitamin E 400 UNIT capsule Take 800 Units by mouth daily. Alvy Beal Last Reconciliation Status: Discontinued  Allergies:  Allergies  Allergen Reactions  . Tetanus Toxoids      Results for orders placed during the hospital encounter of 12/07/10 (from the past 48 hour(s))  PROTIME-INR     Status: Normal   Collection Time   12/07/10  6:58 PM  Component Value Range Comment   Prothrombin Time 13.4  11.6 - 15.2 (seconds)    INR 1.00  0.00 - 1.49      Dg Chest 1 View  12/07/2010  *RADIOLOGY REPORT*  Clinical Data: Fall, pain  CHEST - 1 VIEW  Comparison: Head CT 09/15/2010  Findings: Normal mediastinum and cardiac silhouette.  Normal pulmonary  vasculature.  No evidence of effusion, infiltrate, or pneumothorax.  No acute bony abnormality.  IMPRESSION: No acute  cardiopulmonary process.  Original Report Authenticated By: Genevive Bi, M.D.   Dg Hip Complete Left  12/07/2010  *RADIOLOGY REPORT*  Clinical Data: Fall today, left hip pain  LEFT HIP - COMPLETE 2+ VIEW  Comparison: None.  Findings: There is a fracture of the left femoral neck which is basicervical versus sub capital.  There is varus angulation.  No joint is intact.  IMPRESSION:  Acute left femoral neck fracture.  Per CMS PQRS reporting requirements (PQRS Measure 24): Given the patient's age of greater than 50 and the fracture site (hip, distal radius, or spine), the patient should be tested for osteoporosis using DXA, and the appropriate treatment considered based on the DXA results.  Original Report Authenticated By: Genevive Bi, M.D.   Dg Knee 1-2 Views Left  12/07/2010  *RADIOLOGY REPORT*  Clinical Data: Pain, left hip fx  LEFT KNEE - 1-2 VIEW  Comparison: None  Findings: No evidence of fracture dislocation or the left knee. There is joint space narrowing of the medial and  lateral compartment .  IMPRESSION: No  fracture.  Original Report Authenticated By: Genevive Bi, M.D.    Constitutional: negative for chills, fatigue, fevers, malaise and weight loss Eyes: negative for irritation, redness and visual disturbance Ears, nose, mouth, throat, and face: negative for earaches, nasal congestion, sore throat and voice change Respiratory: negative for cough, dyspnea on exertion and wheezing Cardiovascular: negative for chest pressure/discomfort, claudication, dyspnea, exertional chest pressure/discomfort and palpitations Gastrointestinal: negative for constipation, diarrhea, nausea and vomiting Genitourinary:negative for dysuria, frequency, hesitancy and urinary incontinence Integument/breast: negative for dryness, pruritus and rash Hematologic/lymphatic: negative for bleeding, easy bruising, lymphadenopathy and petechiae Neurological: negative for coordination problems, dizziness, gait  problems, seizures, speech problems and weakness Behavioral/Psych: negative for anxiety, depression and fatigue Blood pressure 140/122, pulse 101, temperature 98.6 F (37 C), temperature source Oral, resp. rate 18, SpO2 97.00%. General appearance: alert, cooperative, appears stated age and Somewhat hard of hearing Head: Normocephalic, without obvious abnormality, atraumatic Eyes: conjunctivae/corneas clear. PERRL, EOM's intact. Fundi benign. Neck: no adenopathy, no carotid bruit, no JVD, supple, symmetrical, trachea midline and thyroid not enlarged, symmetric, no tenderness/mass/nodules Resp: clear to auscultation bilaterally Chest wall: no tenderness Cardio: regular rate and rhythm, S1, S2 normal, no murmur, click, rub or gallop and normal apical impulse GI: soft, non-tender; bowel sounds normal; no masses,  no organomegaly Extremities: extremities normal, atraumatic, no cyanosis or edema and Homans sign is negative, no sign of DVT Pulses: 2+ and symmetric Skin: Skin color, texture, turgor normal. No rashes or lesions Lymph nodes: Cervical, supraclavicular, and axillary nodes normal. Neurologic: Alert and oriented X 3, normal strength and tone. Normal symmetric reflexes. Normal coordination and gait   Assessment/Plan: 1. HTN  Blood pressure is elevated at the time of the most recent vitals, check, but until the last 5 minutes, the patient's pain has been poorly controlled.  He now appears quite comfortable and I would his next reading would be quite a bit improved.  The patient will be continued on his  home medications and I will add something to be available on a PRN basis to control his BP if it becomes elevated in the setting of controlled pain.  2. Hyperlipidemia.  We will continue the patient on his home meds.  3.  Gout.  The patient states that he takes the prednisone for gout and that it has nothing to do with his corneal transplant.  In any case, the patient has been on steroids  chronically and I will add stress doses of steroids to prevent an adrenal crisis during this period of stress.  Post operatively this should be weaned down to the patient's chronic dose of prednisone as steroid certainly can present problems in healing from a procedure such as the hip surgery.  4.  The patient suffered a TIA last July.  He states that he has had no symptoms since that time. Pt has a strong family history for CVA.   Patient will be continued on his current medications and his blood pressure will be carefully monitored.    5. Hip Fracture.  Certainly surgical repair of this fracture is a priority for the future mobility, quality of life, and indeed, length of life for this patient.  Having said that, the patient is at a higher than average risk for perioperative complications due to his comorbidities.  I would encourage early mobility, DVT prophylaxis, and discontinuation of the stress doses of steroids following the surgery, returning him to his chronic dosages of steroid. [name] 12/07/2010, 8:42 PM

## 2010-12-07 NOTE — ED Notes (Signed)
Pt resting quietly at this time. Pt states that he is not hurting unless he moves his leg. Pt alert and oriented x 3. Skin warm and dry. Color pink. IV infusing left A/C with no edema or redness. Family at bedside.

## 2010-12-07 NOTE — ED Provider Notes (Signed)
History     CSN: [id number] Arrival date & time: 12/07/2010  6:34 PM   First MD Initiated Contact with Patient 12/07/10 1912      Chief Complaint  Patient presents with  . Hip Injury    (Consider location/radiation/quality/duration/timing/severity/associated sxs/prior treatment) HPI Comments: Sent from [facility] for hip fracture.  No complaints at present.  Patient is a 77 y.o. male presenting with hip pain. The history is provided by the patient.  Hip Pain This is a new problem. The current episode started 3 to 5 hours ago. The problem occurs constantly. The problem has not changed since onset.The symptoms are aggravated by twisting. The symptoms are relieved by nothing.    Past Medical History  Diagnosis Date  . Hypertension   . Gout   . High cholesterol     No past surgical history on file.  No family history on file.  History  Substance Use Topics  . Smoking status: Former Games developer  . Smokeless tobacco: Current User  . Alcohol Use: No      Review of Systems  All other systems reviewed and are negative.    Allergies  Tetanus toxoids  Home Medications   Current Outpatient Rx  Name Route Sig Dispense Refill  . AMLODIPINE BESYLATE 5 MG PO TABS Oral Take 5 mg by mouth daily.      Marland Kitchen VITAMIN C 1000 MG PO TABS Oral Take 2,000 mg by mouth daily.      . ASPIRIN 325 MG PO TABS Oral Take 325 mg by mouth daily.      . COD LIVER OIL PO Oral Take 500 mg by mouth daily.      Marland Kitchen FLUTICASONE PROPIONATE 50 MCG/ACT NA SUSP Nasal Place 2 sprays into the nose daily.      Marland Kitchen METOPROLOL TARTRATE 50 MG PO TABS Oral Take 50 mg by mouth 2 (two) times daily.      Marland Kitchen PREDNISONE 10 MG PO TABS Oral Take 10 mg by mouth 2 (two) times daily.     Marland Kitchen SIMVASTATIN 20 MG PO TABS Oral Take 20 mg by mouth daily.      Marland Kitchen VITAMIN D (ERGOCALCIFEROL) PO Oral Take 800 mg by mouth daily.      Marland Kitchen VITAMIN E 400 UNITS PO CAPS Oral Take 800 Units by mouth daily.        BP 140/122  Pulse 101  Temp(Src)  98.6 F (37 C) (Oral)  Resp 18  SpO2 97%  Physical Exam  Nursing note and vitals reviewed. Constitutional: He is oriented to person, place, and time. He appears well-developed and well-nourished.  HENT:  Head: Normocephalic and atraumatic.  Neck: Normal range of motion. Neck supple.  Cardiovascular: Normal rate and regular rhythm.  Exam reveals no gallop and no friction rub.   No murmur heard. Pulmonary/Chest: Effort normal and breath sounds normal.  Musculoskeletal:       Left hip pain.  Neurological: He is alert and oriented to person, place, and time.  Skin: Skin is warm.    ED Course  Procedures (including critical care time)   Labs Reviewed  PROTIME-INR   Dg Chest 1 View  12/07/2010  *RADIOLOGY REPORT*  Clinical Data: Fall, pain  CHEST - 1 VIEW  Comparison: Head CT 09/15/2010  Findings: Normal mediastinum and cardiac silhouette.  Normal pulmonary  vasculature.  No evidence of effusion, infiltrate, or pneumothorax.  No acute bony abnormality.  IMPRESSION: No acute cardiopulmonary process.  Original Report Authenticated By: Roseanne Reno  EDMUNDS, M.D.   Dg Hip Complete Left  12/07/2010  *RADIOLOGY REPORT*  Clinical Data: Fall today, left hip pain  LEFT HIP - COMPLETE 2+ VIEW  Comparison: None.  Findings: There is a fracture of the left femoral neck which is basicervical versus sub capital.  There is varus angulation.  No joint is intact.  IMPRESSION:  Acute left femoral neck fracture.  Per CMS PQRS reporting requirements (PQRS Measure 24): Given the patient's age of greater than 50 and the fracture site (hip, distal radius, or spine), the patient should be tested for osteoporosis using DXA, and the appropriate treatment considered based on the DXA results.  Original Report Authenticated By: Genevive Bi, M.D.   Dg Knee 1-2 Views Left  12/07/2010  *RADIOLOGY REPORT*  Clinical Data: Pain, left hip fx  LEFT KNEE - 1-2 VIEW  Comparison: None  Findings: No evidence of fracture  dislocation or the left knee. There is joint space narrowing of the medial and  lateral compartment .  IMPRESSION: No  fracture.  Original Report Authenticated By: Genevive Bi, M.D.     No diagnosis found.    MDM  Patient to be seen and admitted by ortho.        Geoffery Lyons, MD 12/07/10 2303

## 2010-12-07 NOTE — ED Notes (Signed)
Pt slipped and fell on wet leaves x 2. Pt alert and oriented x 3. Skin warm and dry. Color pink. Breath sounds clear and equal bilaterally. Left hip tender to touch. Pt has swollen areas on bilateral 4 th toes. Pt taken off backboard by Dr. Bebe Shaggy and cervical collar removed. Pt c/o pain with movement of his left leg. Able to wiggle toes. Pt has strong pedal pulses bilaterally.

## 2010-12-07 NOTE — ED Notes (Signed)
Pt fell at home and injured his left hip. Pt has shortening to his left leg.

## 2010-12-07 NOTE — ED Notes (Signed)
Transfer from [facility] for L femur neck fracture. Dr Shon Baton to see pt here

## 2010-12-07 NOTE — Progress Notes (Signed)
Orthopedic Tech Progress Note Patient Details:  Kier Smead [facility] 10/05/1931 [id number] hc overhead frame with trapeze bar     Nikki Dom 12/07/2010, 10:12 PM

## 2010-12-07 NOTE — H&P (Signed)
Please note that the H&P was inadvertently dictated as a progress note  Refer to progress note section for my detailed H&P

## 2010-12-07 NOTE — ED Notes (Signed)
Pt to be transferred to [facility] to ED per patient request.

## 2010-12-07 NOTE — Progress Notes (Signed)
Scott Ruths, MD Chief Complaint: Left femoral neck hip fracture   History: Is a pleasant 77 year old man who is otherwise healthy who presented to the emergency department and he can hospital after falling. The patient states that he tripped while in his yard on some water. He denies any syncopal symptoms such as headache blurry vision nausea vomiting or amnesia to the event.  He was initially seen at an outside institution and requested transfer to [name] for definitive fracture management. Patient was diagnosed appropriately with a left femoral neck fracture and was transferred for definitive orthopedic management.  Past Medical History  Diagnosis Date  . Hypertension   . Gout   . High cholesterol     Allergies  Allergen Reactions  . Tetanus Toxoids     Current Facility-Administered Medications on File Prior to Encounter  Medication Dose Route Frequency Provider Last Rate Last Dose  . 0.9 %  sodium chloride infusion   Intravenous Once Donn Pierini Melodi Happel 20 mL/hr at 12/07/10 1541    . fentaNYL (SUBLIMAZE) injection 50 mcg  50 mcg Intravenous Once Joya [name], MD   50 mcg at 12/07/10 1535  . HYDROmorphone (DILAUDID) injection 1 mg  1 mg Intravenous Once Joya [name], MD   1 mg at 12/07/10 1718  . ondansetron (ZOFRAN) injection 4 mg  4 mg Intravenous Once Joya [name], MD   4 mg at 12/07/10 1533  . DISCONTD: 0.9 %  sodium chloride infusion  20 mL Intravenous Continuous Joya [name], MD       Current Outpatient Prescriptions on File Prior to Encounter  Medication Sig Dispense Refill  . amLODipine (NORVASC) 5 MG tablet Take 5 mg by mouth daily.        Marland Kitchen aspirin 325 MG tablet Take 325 mg by mouth daily.        . fluticasone (FLONASE) 50 MCG/ACT nasal spray Place 2 sprays into the nose daily.        . metoprolol (LOPRESSOR) 50 MG tablet Take 50 mg by mouth 2 (two) times daily.        . predniSONE (DELTASONE) 10 MG tablet Take 10 mg by mouth 2 (two) times  daily.       . simvastatin (ZOCOR) 20 MG tablet Take 20 mg by mouth daily.          Physical Exam: Filed Vitals:   12/07/10 1840  BP: 140/122  Pulse: 101  Temp: 98.6 F (37 C)  Resp: 75   Pleasant 77 year old gentleman appears his stated age in no acute distress with a primary complaint of severe left hip pain. The left lower extremity is shortened and externally rotated.  He has no shortness of breath or chest pain at present. He is abdomen is soft and nontender with no rebound tenderness.  Full range of motion the upper extremity with no gross crepitance deformity or pain.  Compartments in the lower extremity are soft and nontender. He has 2+ intact distal pulses bilaterally.  Sensation in the lower extremities intact bilaterally. He can't EHL tibialis anterior gastrocnemius are both intact bilaterally. Should be noted that it is truly difficult to get accurate motor exam on the left side as it causes significant groin and left leg pain secondary to the fracture.  There are no abrasions contusions lacerations in the left hip area.    Image: Images were reviewed I do agree with the radiology report which is listed below. Dg Chest 1 View  12/07/2010  *RADIOLOGY  REPORT*  Clinical Data: Fall, pain  CHEST - 1 VIEW  Comparison: Head CT 09/15/2010  Findings: Normal mediastinum and cardiac silhouette.  Normal pulmonary  vasculature.  No evidence of effusion, infiltrate, or pneumothorax.  No acute bony abnormality.  IMPRESSION: No acute cardiopulmonary process.  Original Report Authenticated By: Genevive Bi, M.D.   Dg Hip Complete Left  12/07/2010  *RADIOLOGY REPORT*  Clinical Data: Fall today, left hip pain  LEFT HIP - COMPLETE 2+ VIEW  Comparison: None.  Findings: There is a fracture of the left femoral neck which is basicervical versus sub capital.  There is varus angulation.  No joint is intact.  IMPRESSION:  Acute left femoral neck fracture.  Per CMS PQRS reporting requirements  (PQRS Measure 24): Given the patient's age of greater than 50 and the fracture site (hip, distal radius, or spine), the patient should be tested for osteoporosis using DXA, and the appropriate treatment considered based on the DXA results.  Original Report Authenticated By: Genevive Bi, M.D.   Dg Knee 1-2 Views Left  12/07/2010  *RADIOLOGY REPORT*  Clinical Data: Pain, left hip fx  LEFT KNEE - 1-2 VIEW  Comparison: None  Findings: No evidence of fracture dislocation or the left knee. There is joint space narrowing of the medial and  lateral compartment .  IMPRESSION: No  fracture.  Original Report Authenticated By: Genevive Bi, M.D.    A/P:  The patient is a pleasant 77 year old gentleman - ASA category 2. Per hip fracture protocol here at [name] I will be happy to admit the patient to my service and will contact the medical team to provide preoperative medical clearance.  Will also obtain an INR was omitted  I did inform the patient that it this point time we will not be going to surgery tonight as hemiarthroplasty surgery is not something that I personally am comfortable performing. I will contact my, partner for definitive fracture management. This would entail a hemiarthroplasty.  The patient this was explained to the patient he is in agreement with the plan. He'll be admitted he'll out at a regular diet tonight will make him n.p.o. after midnight tonight and will consent him for a hemiarthroplasty.

## 2010-12-08 ENCOUNTER — Inpatient Hospital Stay (HOSPITAL_COMMUNITY): Payer: Medicare Other

## 2010-12-08 ENCOUNTER — Encounter (HOSPITAL_COMMUNITY)
Admission: EM | Disposition: A | Discharge status: Skilled Nursing Facility | Payer: Self-pay | Source: Home / Self Care | Attending: Orthopedic Surgery

## 2010-12-08 ENCOUNTER — Encounter (HOSPITAL_COMMUNITY): Payer: Self-pay | Admitting: Anesthesiology

## 2010-12-08 ENCOUNTER — Inpatient Hospital Stay (HOSPITAL_COMMUNITY): Payer: Medicare Other | Admitting: Anesthesiology

## 2010-12-08 HISTORY — PX: HIP ARTHROPLASTY: SHX981

## 2010-12-08 LAB — MRSA PCR SCREENING: MRSA by PCR: POSITIVE — AB

## 2010-12-08 SURGERY — HEMIARTHROPLASTY, HIP, DIRECT ANTERIOR APPROACH, FOR FRACTURE
Anesthesia: General | Site: Hip | Laterality: Left | Wound class: Clean

## 2010-12-08 MED ORDER — ROCURONIUM BROMIDE 100 MG/10ML IV SOLN
INTRAVENOUS | Status: DC | PRN
  Administered 2010-12-08: 50 mg via INTRAVENOUS

## 2010-12-08 MED ORDER — LACTATED RINGERS IV SOLN
INTRAVENOUS | Status: DC | PRN
  Administered 2010-12-08 (×2): via INTRAVENOUS

## 2010-12-08 MED ORDER — ACETAMINOPHEN 10 MG/ML IV SOLN
INTRAVENOUS | Status: DC | PRN
  Administered 2010-12-08: 1000 mg via INTRAVENOUS

## 2010-12-08 MED ORDER — LACTATED RINGERS IV SOLN: INTRAVENOUS | Status: DC

## 2010-12-08 MED ORDER — GLYCOPYRROLATE 0.2 MG/ML IJ SOLN
INTRAMUSCULAR | Status: DC | PRN
  Administered 2010-12-08: .4 mg via INTRAVENOUS

## 2010-12-08 MED ORDER — ACETAMINOPHEN 10 MG/ML IV SOLN
INTRAVENOUS | Status: AC
  Filled 2010-12-08: qty 100

## 2010-12-08 MED ORDER — NEOSTIGMINE METHYLSULFATE 1 MG/ML IJ SOLN
INTRAMUSCULAR | Status: DC | PRN
  Administered 2010-12-08: 2 mg via INTRAVENOUS

## 2010-12-08 MED ORDER — KCL IN DEXTROSE-NACL 20-5-0.45 MEQ/L-%-% IV SOLN
INTRAVENOUS | Status: AC
  Administered 2010-12-08: 14:00:00 via INTRAVENOUS
  Filled 2010-12-08: qty 1000

## 2010-12-08 MED ORDER — CHLORHEXIDINE GLUCONATE CLOTH 2 % EX PADS
6.0000 | MEDICATED_PAD | Freq: Two times a day (BID) | CUTANEOUS | Status: DC
  Administered 2010-12-09 – 2010-12-10 (×4): 6 via TOPICAL

## 2010-12-08 MED ORDER — FENTANYL CITRATE 0.05 MG/ML IJ SOLN
INTRAMUSCULAR | Status: DC | PRN
  Administered 2010-12-08 (×2): 50 ug via INTRAVENOUS
  Administered 2010-12-08 (×2): 100 ug via INTRAVENOUS
  Administered 2010-12-08: 50 ug via INTRAVENOUS

## 2010-12-08 MED ORDER — VANCOMYCIN HCL 1000 MG IV SOLR
1000.0000 mg | INTRAVENOUS | Status: DC | PRN
  Administered 2010-12-08: 1 g via INTRAVENOUS

## 2010-12-08 MED ORDER — SODIUM CHLORIDE 0.9 % IR SOLN
Status: DC | PRN
  Administered 2010-12-08: 1000 mL

## 2010-12-08 MED ORDER — PROPOFOL 10 MG/ML IV EMUL
INTRAVENOUS | Status: DC | PRN
  Administered 2010-12-08: 120 mg via INTRAVENOUS

## 2010-12-08 MED ORDER — HYDROMORPHONE HCL PF 1 MG/ML IJ SOLN: 0.2500 mg | INTRAMUSCULAR | Status: DC | PRN

## 2010-12-08 MED ORDER — ONDANSETRON HCL 4 MG/2ML IJ SOLN: 4.0000 mg | Freq: Once | INTRAMUSCULAR | Status: DC | PRN

## 2010-12-08 MED ORDER — VANCOMYCIN HCL IN DEXTROSE 1-5 GM/200ML-% IV SOLN
1000.0000 mg | Freq: Once | INTRAVENOUS | Status: DC
  Filled 2010-12-08: qty 200

## 2010-12-08 SURGICAL SUPPLY — 49 items
BLADE SAW SAG 73X25 THK (BLADE) ×1
BLADE SAW SGTL 73X25 THK (BLADE) ×1 IMPLANT
BRUSH FEMORAL CANAL (MISCELLANEOUS) IMPLANT
CLOTH BEACON ORANGE TIMEOUT ST (SAFETY) ×2 IMPLANT
COVER SURGICAL LIGHT HANDLE (MISCELLANEOUS) ×2 IMPLANT
DRAPE INCISE IOBAN 85X60 (DRAPES) ×2 IMPLANT
DRAPE ORTHO SPLIT 77X108 STRL (DRAPES) ×4
DRAPE SURG ORHT 6 SPLT 77X108 (DRAPES) ×2 IMPLANT
DRAPE U-SHAPE 47X51 STRL (DRAPES) ×2 IMPLANT
DRSG MEPILEX BORDER 4X8 (GAUZE/BANDAGES/DRESSINGS) ×2 IMPLANT
DURAPREP 26ML APPLICATOR (WOUND CARE) ×2 IMPLANT
ELECT REM PT RETURN 9FT ADLT (ELECTROSURGICAL) ×2
ELECTRODE REM PT RTRN 9FT ADLT (ELECTROSURGICAL) ×1 IMPLANT
EVACUATOR 1/8 PVC DRAIN (DRAIN) ×2 IMPLANT
FACESHIELD LNG OPTICON STERILE (SAFETY) ×3 IMPLANT
GLOVE BIOGEL PI IND STRL 7.5 (GLOVE) ×1 IMPLANT
GLOVE BIOGEL PI IND STRL 8 (GLOVE) ×2 IMPLANT
GLOVE BIOGEL PI INDICATOR 7.5 (GLOVE) ×1
GLOVE BIOGEL PI INDICATOR 8 (GLOVE) ×2
GLOVE ORTHO TXT STRL SZ7.5 (GLOVE) ×2 IMPLANT
GLOVE SURG ORTHO 8.0 STRL STRW (GLOVE) ×2 IMPLANT
GOWN STRL NON-REIN LRG LVL3 (GOWN DISPOSABLE) ×6 IMPLANT
HANDPIECE INTERPULSE COAX TIP (DISPOSABLE)
IMMOBILIZER KNEE 22 UNIV (SOFTGOODS) ×2 IMPLANT
KIT BASIN OR (CUSTOM PROCEDURE TRAY) ×2 IMPLANT
KIT ROOM TURNOVER OR (KITS) ×2 IMPLANT
MANIFOLD NEPTUNE II (INSTRUMENTS) ×2 IMPLANT
NS IRRIG 1000ML POUR BTL (IV SOLUTION) ×2 IMPLANT
PACK TOTAL JOINT (CUSTOM PROCEDURE TRAY) ×2 IMPLANT
PAD ARMBOARD 7.5X6 YLW CONV (MISCELLANEOUS) ×2 IMPLANT
PENCIL BUTTON HOLSTER BLD 10FT (ELECTRODE) ×1 IMPLANT
PRESSURIZER FEMORAL UNIV (MISCELLANEOUS) IMPLANT
SET HNDPC FAN SPRY TIP SCT (DISPOSABLE) IMPLANT
SPECIMEN JAR MEDIUM (MISCELLANEOUS) ×2 IMPLANT
SPONGE GAUZE 2X2 8PLY STRL LF (GAUZE/BANDAGES/DRESSINGS) ×1 IMPLANT
SPONGE LAP 4X18 X RAY DECT (DISPOSABLE) ×4 IMPLANT
STAPLER VISISTAT (STAPLE) ×2 IMPLANT
STAPLER VISISTAT 35W (STAPLE) IMPLANT
STRIP CLOSURE SKIN 1/2X4 (GAUZE/BANDAGES/DRESSINGS) ×2 IMPLANT
SUT MNCRL AB 4-0 PS2 18 (SUTURE) IMPLANT
SUT VIC AB 1 CT1 27 (SUTURE)
SUT VIC AB 1 CT1 27XBRD ANBCTR (SUTURE) IMPLANT
SUT VIC AB 2-0 CT1 27 (SUTURE)
SUT VIC AB 2-0 CT1 TAPERPNT 27 (SUTURE) IMPLANT
TOWEL OR 17X24 6PK STRL BLUE (TOWEL DISPOSABLE) ×2 IMPLANT
TOWEL OR 17X26 10 PK STRL BLUE (TOWEL DISPOSABLE) ×2 IMPLANT
TOWER CARTRIDGE SMART MIX (DISPOSABLE) IMPLANT
TRAY FOLEY CATH 14FR (SET/KITS/TRAYS/PACK) ×1 IMPLANT
WATER STERILE IRR 1000ML POUR (IV SOLUTION) ×8 IMPLANT

## 2010-12-08 NOTE — Anesthesia Procedure Notes (Signed)
Procedure Name: Intubation Date/Time: 12/08/2010 7:35 PM Performed by: Fuller Canada Pre-anesthesia Checklist: Patient identified, Emergency Drugs available, Timeout performed, Suction available and Patient being monitored Patient Re-evaluated:Patient Re-evaluated prior to inductionOxygen Delivery Method: Circle System Utilized Preoxygenation: Pre-oxygenation with 100% oxygen Intubation Type: IV induction Ventilation: Mask ventilation without difficulty Laryngoscope Size: Mac and 3 Grade View: Grade I Tube type: Oral Tube size: 7.5 mm Number of attempts: 1 Airway Equipment and Method: stylet Placement Confirmation: ETT inserted through vocal cords under direct vision,  breath sounds checked- equal and bilateral and positive ETCO2 Secured at: 23 cm Tube secured with: Tape Dental Injury: Teeth and Oropharynx as per pre-operative assessment

## 2010-12-08 NOTE — Progress Notes (Addendum)
[name]:[id number],WJX:[id number] is a 77 y.o. male,  Outpatient Primary MD for the patient is Kirk Ruths, MD Chief Complaint  Patient presents with  . Hip Injury   12/08/2010    Assessment & Plan   1. HTN Blood pressure - appears to be in good control on B blocker and pain control continue. Will follow.  2. Hyperlipidemia. We will continue the patient on his home meds.   3. Gout. Is on Chr Prednisone, stress dose cover for 24-48 hrs, then Dc based on BP and lytes.  4. The patient suffered a TIA last July. He states that he has had no symptoms since that time.continue statin,B blocker & Add full dose ASA once stable from Ortho standpoint.  5. Hip Fracture. - risk factor stratification per yesterdays eval, no dysrhythmia, CAD, CHF history not smoker (last76yrs), walks 2-3 miles no problems, no clinical CHF,  Mod risk for adverse Card Pulm outcome- D/W Dr Eden Emms Cards, Ok to proceed. Pt accepts the risk involved. DVT prophylaxis per Ortho.  6. Low K IVF adjusted.  See all Orders from today for further details     Subjective:   [name] today has, No headache, No chest pain, No abdominal pain - No Nausea, No new weakness tingling or numbness, No Cough - SOB.    Objective:   Vital signs in last 24 hours:  Filed Vitals:   12/08/10 0000 12/08/10 0235 12/08/10 0600 12/08/10 0800  BP:  122/79 139/88   Pulse:  89 84   Temp:  99 F (37.2 C) 98.2 F (36.8 C)   TempSrc:  Oral Oral   Resp: 20 20 18 18   SpO2: 97% 93% 95%     Intake/Output from previous day:   Intake/Output Summary (Last 24 hours) at 12/08/10 1401 Last data filed at 12/08/10 0800  Gross per 24 hour  Intake 795.92 ml  Output    875 ml  Net -79.08 ml   Exam Awake Alert, Oriented *3, No new F.N deficits, Normal affect [place].AT,PERRAL Supple Neck,No JVD, No cervical lymphadenopathy appriciated.  Symmetrical Chest wall movement, Good air movement bilaterally, CTAB RRR,No Gallops,Rubs or new  Murmurs, No Parasternal Heave +ve B.Sounds, Abd Soft, Non tender, No organomegaly appriciated, No rebound -guarding or rigidity. No Cyanosis, Clubbing or edema, No new Rash or bruise, L leg in splint   Data Review   Radiology Reports  Dg Chest 1 View  12/07/2010  *RADIOLOGY REPORT*  Clinical Data: Fall, pain  CHEST - 1 VIEW  Comparison: Head CT 09/15/2010  Findings: Normal mediastinum and cardiac silhouette.  Normal pulmonary  vasculature.  No evidence of effusion, infiltrate, or pneumothorax.  No acute bony abnormality.  IMPRESSION: No acute cardiopulmonary process.  Original Report Authenticated By: Genevive Bi, M.D.   Dg Hip Complete Left  12/07/2010  *RADIOLOGY REPORT*  Clinical Data: Fall today, left hip pain  LEFT HIP - COMPLETE 2+ VIEW  Comparison: None.  Findings: There is a fracture of the left femoral neck which is basicervical versus sub capital.  There is varus angulation.  No joint is intact.  IMPRESSION:  Acute left femoral neck fracture.  Per CMS PQRS reporting requirements (PQRS Measure 24): Given the patient's age of greater than 50 and the fracture site (hip, distal radius, or spine), the patient should be tested for osteoporosis using DXA, and the appropriate treatment considered based on the DXA results.  Original Report Authenticated By: Genevive Bi, M.D.   Dg Knee 1-2 Views Left  12/07/2010  *  RADIOLOGY REPORT*  Clinical Data: Pain, left hip fx  LEFT KNEE - 1-2 VIEW  Comparison: None  Findings: No evidence of fracture dislocation or the left knee. There is joint space narrowing of the medial and  lateral compartment .  IMPRESSION: No  fracture.  Original Report Authenticated By: Genevive Bi, M.D.     Lab Results:  Micro Results: Recent Results (from the past 240 hour(s))  MRSA PCR SCREENING     Status: Abnormal   Collection Time   12/08/10  2:44 AM      Component Value Range Status Comment   MRSA by PCR POSITIVE (*) NEGATIVE  Final      Basename  12/07/10 1528  NA 141  K 3.4*  CL 106  CO2 27  GLUCOSE 118*  BUN 18  CREATININE 1.19  CALCIUM 9.8  MG --  PHOS --    Basename 12/07/10 1528  AST 25  ALT 27  ALKPHOS 69  BILITOT 0.5  PROT 7.1  ALBUMIN 3.8   No results found for this basename: LIPASE:2,AMYLASE:2 in the last 72 hours  Basename 12/07/10 1528  WBC 10.6*  NEUTROABS 8.2*  HGB 13.7  HCT 40.7  MCV 100.2*  PLT 135*   Medications: Scheduled Meds:   . amLODipine  5 mg Oral Daily  . ceFAZolin (ANCEF) IV  2 g Intravenous 60 min Pre-Op  . Chlorhexidine Gluconate Cloth  6 each Topical BID  . fluticasone  2 spray Each Nare Daily  . hydrocortisone sod succinate (SOLU-CORTEF) injection  100 mg Intravenous Q8H  . metoprolol  50 mg Oral BID  . predniSONE  10 mg Oral BID  . simvastatin  20 mg Oral QHS   Continuous Infusions:   . dextrose 5 % and 0.45 % NaCl with KCl 20 mEq/L 50 mL/hr at 12/08/10 1347  . DISCONTD: dextrose 5 % and 0.45% NaCl 85 mL/hr at 12/08/10 1202   PRN Meds:.cloNIDine, methocarbamol(ROBAXIN) IV, methocarbamol, morphine, oxyCODONE-acetaminophen, DISCONTD: cloNIDine HCl

## 2010-12-08 NOTE — Progress Notes (Signed)
Received order for case manager consult for Home Health. Patient going to surgery today for hip fracture. Will continue to follow.

## 2010-12-08 NOTE — Interval H&P Note (Signed)
History and Physical Interval Note:   12/08/2010   7:19 PM   [name] [place]  has presented today for surgery, with the diagnosis of left femoral neck fracture  The various methods of treatment have been discussed with the patient and family. After consideration of risks, benefits and other options for treatment, the patient has consented to  Procedure(s): ARTHROPLASTY BIPOLAR HIP as a surgical intervention .  The patients' history has been reviewed, patient examined, no change in status, stable for surgery.  I have reviewed the patients' chart and labs.  Questions were answered to the patient's satisfaction.     [name]  MD  Patient seen and examined, chart history reviewed.  Patient is going to the operating room tonight for a left hip hemiarthoplasty

## 2010-12-08 NOTE — Transfer of Care (Signed)
Immediate Anesthesia Transfer of Care Note  Patient: [name]  Procedure(s) Performed:  Nancie Neas HIP  Patient Location: PACU  Anesthesia Type: General  Level of Consciousness: awake  Airway & Oxygen Therapy: Patient Spontanous Breathing and Patient connected to nasal cannula oxygen  Post-op Assessment: Report given to PACU RN  Post vital signs: stable  Complications: No apparent anesthesia complications

## 2010-12-08 NOTE — Anesthesia Preprocedure Evaluation (Addendum)
Anesthesia Evaluation  Patient identified by MRN, date of birth, ID band Patient awake    Reviewed: Allergy & Precautions, H&P , NPO status , [name] Chart, lab work & pertinent test results, reviewed documented beta blocker date and time   History of Anesthesia Complications History of anesthetic complications: no prior anesthetics.  Airway Mallampati: I TM Distance: >3 FB Neck ROM: Full    Dental  (+) Lower Dentures, Missing and Poor Dentition   Pulmonary  clear to auscultation        Cardiovascular hypertension, Pt. on medications and Pt. on home beta blockers Regular Normal hyperlipidemia   Neuro/Psych    GI/Hepatic   Endo/Other    Renal/GU      Musculoskeletal   Abdominal (+)  Abdomen: soft.    Peds  Hematology   Anesthesia Other Findings   Reproductive/Obstetrics                         Anesthesia Physical Anesthesia Plan  ASA: III  Anesthesia Plan: General   Post-op Pain Management:    Induction: Intravenous  Airway Management Planned: Oral ETT  Additional Equipment:   Intra-op Plan:   Post-operative Plan: Extubation in OR  Informed Consent: I have reviewed the patients History and Physical, chart, labs and discussed the procedure including the risks, benefits and alternatives for the proposed anesthesia with the patient or authorized representative who has indicated his/her understanding and acceptance.   Dental advisory given  Plan Discussed with: CRNA and Surgeon  Anesthesia Plan Comments:         Anesthesia Quick Evaluation

## 2010-12-08 NOTE — Brief Op Note (Signed)
12/07/2010 - 12/08/2010  8:59 PM  PATIENT:  [name]  77 y.o. male  PRE-OPERATIVE DIAGNOSIS:  left femoral neck fracture  POST-OPERATIVE DIAGNOSIS:  left femoral neck fracture  PROCEDURE:  Procedure(s): Left Hip hemi-ARTHROPLASTY  SURGEON:  Surgeon(s): [name]  PHYSICIAN ASSISTANT:   ASSISTANTS: none   ANESTHESIA:   general  EBL:  Total I/O In: 1000 [I.V.:1000] Out: 300 [Urine:250; Blood:50]  BLOOD ADMINISTERED:none  DRAINS: (1) Hemovact drain(s) in the left hip with  Suction Open   LOCAL MEDICATIONS USED:  NONE  SPECIMEN:  No Specimen  DISPOSITION OF SPECIMEN:  N/A  COUNTS:  YES  TOURNIQUET:  * No tourniquets in log *  DICTATION: .Other Dictation: Dictation Number [id number]  PLAN OF CARE: Admit to inpatient   PATIENT DISPOSITION:  PACU - hemodynamically stable.   Delay start of Pharmacological VTE agent (>24hrs) due to surgical blood loss or risk of bleeding:  {YES/NO/NOT APPLICABLE:20182

## 2010-12-08 NOTE — Progress Notes (Signed)
Subjective:      Patient reports pain as 5 on 0-10 scale.  Reports left leg pain Negative chest pain or shortness of breath  Objective: Vital signs in last 24 hours: Temp:  [98 F (36.7 C)-100.2 F (37.9 C)] 98.2 F (36.8 C) (11/12 0600) Pulse Rate:  [79-101] 84  (11/12 0600) Resp:  [18-22] 18  (11/12 0600) BP: (103-140)/(68-122) 139/88 mmHg (11/12 0600) SpO2:  [92 %-97 %] 95 % (11/12 0600) Weight:  [106.595 kg (235 lb)] 235 lb (106.595 kg) (11/11 1447)  Intake/Output from previous day: 11/11 0701 - 11/12 0700 In: -  Out: 875 [Urine:875]   Basename 12/07/10 1528  WBC 10.6*  RBC 4.06*  HCT 40.7  PLT 135*    Basename 12/07/10 1528  NA 141  K 3.4*  CL 106  CO2 27  BUN 18  CREATININE 1.19  GLUCOSE 118*  CALCIUM 9.8    Basename 12/07/10 1858  LABPT --  INR 1.00    ABD soft Neurovascular intact Intact pulses distally Compartment soft  Assessment/Plan: Patient stable  Continue care  Plan on surgery later today by Dr. Lottie Rater, [place] 12/08/2010, 8:04 AM

## 2010-12-08 NOTE — Anesthesia Postprocedure Evaluation (Signed)
  Anesthesia Post-op Note  Patient: [name]  Procedure(s) Performed:  ARTHROPLASTY BIPOLAR HIP  Patient Location: PACU  Anesthesia Type: General  Level of Consciousness: awake, alert  and oriented  Airway and Oxygen Therapy: Patient Spontanous Breathing and Patient connected to nasal cannula oxygen  Post-op Pain: mild  Post-op Assessment: Post-op Vital signs reviewed and [name] Cardiovascular Status Stable  Post-op Vital Signs: stable  Complications: No apparent anesthesia complications

## 2010-12-08 NOTE — H&P (View-Only) (Signed)
Subjective:      Patient reports pain as 5 on 0-10 scale.  Reports left leg pain Negative chest pain or shortness of breath  Objective: Vital signs in last 24 hours: Temp:  [98 F (36.7 C)-100.2 F (37.9 C)] 98.2 F (36.8 C) (11/12 0600) Pulse Rate:  [79-101] 84  (11/12 0600) Resp:  [18-22] 18  (11/12 0600) BP: (103-140)/(68-122) 139/88 mmHg (11/12 0600) SpO2:  [92 %-97 %] 95 % (11/12 0600) Weight:  [106.595 kg (235 lb)] 235 lb (106.595 kg) (11/11 1447)  Intake/Output from previous day: 11/11 0701 - 11/12 0700 In: -  Out: 875 [Urine:875]   Basename 12/07/10 1528  WBC 10.6*  RBC 4.06*  HCT 40.7  PLT 135*    Basename 12/07/10 1528  NA 141  K 3.4*  CL 106  CO2 27  BUN 18  CREATININE 1.19  GLUCOSE 118*  CALCIUM 9.8    Basename 12/07/10 1858  LABPT --  INR 1.00    ABD soft Neurovascular intact Intact pulses distally Compartment soft  Assessment/Plan: Patient stable  Continue care  Plan on surgery later today by Dr. Olin   Breann Losano J 12/08/2010, 8:04 AM 

## 2010-12-08 NOTE — Preoperative (Signed)
Beta Blockers   Reason not to administer Beta Blockers:Not Applicable 

## 2010-12-09 LAB — CBC
HCT: 36.8 % — ABNORMAL LOW (ref 39.0–52.0)
MCHC: 33.2 g/dL (ref 30.0–36.0)
Platelets: 111 10*3/uL — ABNORMAL LOW (ref 150–400)
RDW: 13.1 % (ref 11.5–15.5)
WBC: 11.5 10*3/uL — ABNORMAL HIGH (ref 4.0–10.5)

## 2010-12-09 LAB — BASIC METABOLIC PANEL
BUN: 15 mg/dL (ref 6–23)
Calcium: 9.4 mg/dL (ref 8.4–10.5)
Chloride: 108 mEq/L (ref 96–112)
Creatinine, Ser: 1.01 mg/dL (ref 0.50–1.35)
GFR calc Af Amer: 81 mL/min — ABNORMAL LOW (ref >90)
GFR calc non Af Amer: 70 mL/min — ABNORMAL LOW (ref >90)

## 2010-12-09 MED ORDER — ENOXAPARIN SODIUM 40 MG/0.4ML ~~LOC~~ SOLN
40.0000 mg | SUBCUTANEOUS | Status: DC
  Administered 2010-12-09 – 2010-12-10 (×2): 40 mg via SUBCUTANEOUS
  Filled 2010-12-09 (×3): qty 0.4

## 2010-12-09 MED ORDER — ACETAMINOPHEN 325 MG PO TABS: 650.0000 mg | ORAL_TABLET | Freq: Four times a day (QID) | ORAL | Status: DC | PRN

## 2010-12-09 MED ORDER — METHOCARBAMOL 100 MG/ML IJ SOLN
500.0000 mg | Freq: Four times a day (QID) | INTRAVENOUS | Status: DC | PRN
  Filled 2010-12-09: qty 5

## 2010-12-09 MED ORDER — PHENOL 1.4 % MT LIQD
1.0000 | OROMUCOSAL | Status: DC | PRN
  Filled 2010-12-09: qty 177

## 2010-12-09 MED ORDER — MAGNESIUM HYDROXIDE 400 MG/5ML PO SUSP
30.0000 mL | Freq: Two times a day (BID) | ORAL | Status: DC | PRN
  Administered 2010-12-09: 30 mL via ORAL

## 2010-12-09 MED ORDER — MUPIROCIN 2 % EX OINT
TOPICAL_OINTMENT | Freq: Two times a day (BID) | CUTANEOUS | Status: DC
  Administered 2010-12-09 – 2010-12-10 (×4): via NASAL
  Filled 2010-12-09: qty 22

## 2010-12-09 MED ORDER — ONDANSETRON HCL 4 MG/2ML IJ SOLN: 4.0000 mg | Freq: Four times a day (QID) | INTRAMUSCULAR | Status: DC | PRN

## 2010-12-09 MED ORDER — HYDROCODONE-ACETAMINOPHEN 5-325 MG PO TABS
1.0000 | ORAL_TABLET | ORAL | Status: DC | PRN
  Administered 2010-12-09: 1 via ORAL
  Filled 2010-12-09: qty 1

## 2010-12-09 MED ORDER — HYDROCODONE-ACETAMINOPHEN 5-325 MG PO TABS
1.0000 | ORAL_TABLET | ORAL | Status: DC | PRN
  Administered 2010-12-09 – 2010-12-10 (×3): 1 via ORAL
  Filled 2010-12-09 (×3): qty 1

## 2010-12-09 MED ORDER — FLEET ENEMA 7-19 GM/118ML RE ENEM: 1.0000 | ENEMA | Freq: Every day | RECTAL | Status: DC | PRN

## 2010-12-09 MED ORDER — ALUM & MAG HYDROXIDE-SIMETH 200-200-20 MG/5ML PO SUSP: 30.0000 mL | ORAL | Status: DC | PRN

## 2010-12-09 MED ORDER — BISACODYL 5 MG PO TBEC: 10.0000 mg | DELAYED_RELEASE_TABLET | Freq: Every day | ORAL | Status: DC | PRN

## 2010-12-09 MED ORDER — MENTHOL 3 MG MT LOZG: 1.0000 | LOZENGE | OROMUCOSAL | Status: DC | PRN

## 2010-12-09 MED ORDER — METOCLOPRAMIDE HCL 5 MG/ML IJ SOLN
5.0000 mg | Freq: Three times a day (TID) | INTRAMUSCULAR | Status: DC | PRN
  Filled 2010-12-09: qty 2

## 2010-12-09 MED ORDER — BISACODYL 10 MG RE SUPP: 10.0000 mg | Freq: Every day | RECTAL | Status: DC | PRN

## 2010-12-09 MED ORDER — METHOCARBAMOL 500 MG PO TABS: 500.0000 mg | ORAL_TABLET | Freq: Four times a day (QID) | ORAL | Status: DC | PRN

## 2010-12-09 MED ORDER — ACETAMINOPHEN 650 MG RE SUPP: 650.0000 mg | Freq: Four times a day (QID) | RECTAL | Status: DC | PRN

## 2010-12-09 MED ORDER — DOCUSATE SODIUM 100 MG PO CAPS
100.0000 mg | ORAL_CAPSULE | Freq: Two times a day (BID) | ORAL | Status: DC
  Administered 2010-12-09 – 2010-12-11 (×5): 100 mg via ORAL
  Filled 2010-12-09 (×6): qty 1

## 2010-12-09 MED ORDER — POLYETHYLENE GLYCOL 3350 17 G PO PACK
17.0000 g | PACK | Freq: Every day | ORAL | Status: DC | PRN
  Filled 2010-12-09: qty 1

## 2010-12-09 MED ORDER — ONDANSETRON HCL 4 MG PO TABS: 4.0000 mg | ORAL_TABLET | Freq: Four times a day (QID) | ORAL | Status: DC | PRN

## 2010-12-09 MED ORDER — METOCLOPRAMIDE HCL 10 MG PO TABS: 5.0000 mg | ORAL_TABLET | Freq: Three times a day (TID) | ORAL | Status: DC | PRN

## 2010-12-09 MED ORDER — SODIUM CHLORIDE 0.9 % IV SOLN
INTRAVENOUS | Status: DC
  Filled 2010-12-09 (×2): qty 1000

## 2010-12-09 MED ORDER — FERROUS SULFATE 325 (65 FE) MG PO TABS
325.0000 mg | ORAL_TABLET | Freq: Three times a day (TID) | ORAL | Status: DC
  Administered 2010-12-09 – 2010-12-11 (×8): 325 mg via ORAL
  Filled 2010-12-09 (×11): qty 1

## 2010-12-09 NOTE — Op Note (Signed)
NAME:  [name], [name]                    ACCOUNT NO.:  MEDICAL RECORD NO.:  [id number]  LOCATION:                                 FACILITY:  PHYSICIAN:  Madlyn Frankel. Charlann Boxer, M.D.  DATE OF BIRTH:  07-May-1931  DATE OF PROCEDURE:  12/08/2010 DATE OF DISCHARGE:                              OPERATIVE REPORT   PREOPERATIVE DIAGNOSIS:  Left displaced femoral neck fracture.  POSTOPERATIVE DIAGNOSIS:  Left displaced femoral neck fracture.  PROCEDURE:  Left hip hemiarthroplasty utilizing DePuy component, a size 5 standard Tri-Lock stem with a size 54 unipolar ball and a +5 adapter.  SURGEON:  Madlyn Frankel. Charlann Boxer, MD  ASSISTANT:  Surgical team.  ANESTHESIA:  General.  BLOOD LOSS:  Less than 100 mL.  DRAINS:  One Hemovac.  COMPLICATION:  None.  SPECIMEN:  None.  INDICATION FOR PROCEDURE:  [name] is a 77 year old male who had a fall in his home yesterday.  He is admitted to [facility] with diagnosis of left displaced femoral neck fracture.  Once cleared for surgery, surgery was scheduled.  Risks and benefits/necessity of the procedure were discussed.  Risks of infection, DVT were reviewed as well as the postop course and expectations.  PROCEDURE IN DETAIL:  The patient was brought to the operative theater. Once adequate anesthesia and preoperative antibiotics, Ancef, 2 g in addition to 1 g of vancomycin due to a MRSA positive screening.  He was positioned to the right lateral decubitus position with left side up. The left lower extremity was prescrubbed, prepped and draped in sterile fashion.  Time-out was performed identifying the patient, planned procedure, and extremity.  Incision was then made based off the proximal trochanter.  Sharp dissection was carried to the iliotibial band and gluteal fascia.  These were then incised for posterior approach.  The short external rotators were taken down separate from the posterior capsule.  An L capsulotomy was made preserving  the posterior capsule for later anatomic repair.  At this point, the fracture was identified.  The hip was flexed and internally rotated allowing for removal of the femoral head.  Femoral head was removed and measured on the back table to be 54 mm in diameter.  At this point with the retractors placed on the femoral neck, I created neck osteotomy into the trochanteric fossa removing remaining fractured bone segments.  The proximal femur was then opened with a starting drill hand, reamed once and irrigated to try to prevent fat emboli.  I began broaching with a 0 broach and then broached up to a size 5 broach and then used a calcar planer to finish off the neck cut.  Based on his intraoperative radiographs, I used a standard neck.  A trial reduction was carried out with 54 ball and initially a +0 adapter.  With this, I felt there was just little bit of some subluxation at hip flexion, internal rotation about 70-80 degrees.  At this point, I chose the final prosthesis based off the trial reduction holding on the adapter.  At this point, the trial components were removed.  The final 5 standard was opened.  The canal was irrigated and  the final stem was impacted into position.  With the stem seating at appropriate level at the new neck cut, I retrialed with a +5 adapter.  With this, I felt the hip range of motion was very well tolerated without evidencing impingement.  I chose the +5 adapter.  The +5 adapter was then impacted into 54 unipolar ball and the 2 were then impacted onto clean and dry trunnion.  The hip had been irrigated throughout the case again at this point.  I reapproximated the posterior capsule superior leaflet using #1 Vicryl.  A medium Hemovac drain was placed deep.  The iliotibial band and gluteal fascia were then reapproximated using #1 Vicryl.  The remainder wound was closed with 2-0 Vicryl and running 4-0 Monocryl.  The hip was cleaned, dried, and dressed sterilely  using Steri-Strips and Mepilex dressing.  The drain site was dressed separately.  He was then extubated and brought to the recovery room in stable condition, tolerating the procedure well.     Madlyn Frankel Charlann Boxer, M.D.     MDO/MEDQ  D:  12/08/2010  T:  12/08/2010  Job:  [id number]

## 2010-12-09 NOTE — Progress Notes (Signed)
Clinical Social Work-Please see full assessment in shadow chart. CSW initiating FL2 and bed search. Jodean Lima, [phone]

## 2010-12-09 NOTE — Progress Notes (Signed)
Subjective: 1 Day Post-Op Procedure(s) (LRB): ARTHROPLASTY BIPOLAR HIP (Left)  Doing well, no major events or complaints  Patient reports pain as mild.  Objective:   VITALS:   Filed Vitals:   12/08/10 2200  BP: 121/69  Pulse: 80  Temp: 97.6 F (36.4 C)  Resp: 20    Neurologically intact Incision: dressing C/D/I and no drainage HV drain out with minimal bloody drainage  LABS  Basename 12/07/10 1528  HGB 13.7  HCT 40.7  WBC 10.6*  PLT 135*     Basename 12/07/10 1528  NA 141  K 3.4*  BUN 18  CREATININE 1.19  GLUCOSE 118*     Basename 12/07/10 1858  LABPT --  INR 1.00     Assessment/Plan: 1 Day Post-Op Procedure(s) (LRB): ARTHROPLASTY BIPOLAR HIP (Left)  HV out, PT/OT SW consult for SNF, [facility]   Up with therapy, WBAT LLE

## 2010-12-09 NOTE — Progress Notes (Signed)
Occupational Therapy Evaluation Patient Details Name: [name] MRN: [id number] DOB: 1931-05-17 Today's Date: 12/09/2010  Problem List:  Patient Active Problem List  Diagnoses  . Hypertension  . Hypercholesterolemia  . Hip fracture, left    Past Medical History:  Past Medical History  Diagnosis Date  . Hypertension   . Gout   . High cholesterol    Past Surgical History: History reviewed. No pertinent past surgical history.  OT Assessment/Plan/Recommendation OT Assessment Clinical Impression Statement: Pt will benefit from OT services in the acute care setting to increase I with ADLs and to increase I with functional transfers in prep for eventual d/c home. OT Recommendation/Assessment: Patient will need skilled OT in the acute care venue OT Problem List: Decreased knowledge of use of DME or AE;Decreased knowledge of precautions;Pain OT Therapy Diagnosis : Generalized weakness;Acute pain OT Plan OT Frequency: Min 1X/week OT Treatment/Interventions: Self-care/ADL training;DME and/or AE instruction;Therapeutic activities;Patient/family education OT Recommendation Follow Up Recommendations: Skilled nursing facility Equipment Recommended: Defer to next venue Individuals Consulted Consulted and Agree with Results and Recommendations: Patient OT Goals Acute Rehab OT Goals OT Goal Formulation: With patient Time For Goal Achievement: 7 days ADL Goals Pt Will Perform Grooming: with supervision;Standing at sink ADL Goal: Grooming - Progress: Other (comment) Pt Will Perform Lower Body Bathing: with min assist;Sit to stand from chair;with adaptive equipment;Other (comment) (while demonstrating 3/3 posterior hip precautions) ADL Goal: Lower Body Bathing - Progress: Other (comment) Pt Will Perform Lower Body Dressing: with min assist;Sit to stand from chair;with adaptive equipment;Other (comment) (while demonstrating 3/3 posterior hip precautions) ADL Goal: Lower Body Dressing -  Progress: Other (comment) Pt Will Transfer to Toilet: with supervision;Stand pivot transfer;with DME;3-in-1;Maintaining hip precautions ADL Goal: Toilet Transfer - Progress: Other (comment)  OT Evaluation Precautions/Restrictions  Precautions Precautions: Posterior Hip Restrictions Weight Bearing Restrictions: Yes LLE Weight Bearing: Weight bearing as tolerated Prior Functioning Home Living Lives With: Family;Spouse Receives Help From: Family Type of Home: House Home Layout: One level Home Access: Stairs to enter Entrance Stairs-Rails: None Entrance Stairs-Number of Steps: 3 Bathroom Shower/Tub: Engineer, manufacturing systems: Standard Home Adaptive Equipment: Tub transfer bench;Walker - rolling;Straight cane;Crutches;Bedside commode/3-in-1 Prior Function Level of Independence: Independent with basic ADLs;Independent with gait;Independent with homemaking with ambulation;Independent with transfers Able to Take Stairs?: Yes Driving: Yes Vocation: Retired ADL ADL Eating/Feeding: Simulated;Independent Where Assessed - Eating/Feeding: Chair Grooming: Performed;Wash/dry face;Set up Grooming Details (indicate cue type and reason): setup assist to gather bathing materials. Where Assessed - Grooming: Sitting, chair Upper Body Bathing: Right arm;Left arm;Performed;Chest;Set up Upper Body Bathing Details (indicate cue type and reason): setup assist to gather bathing materials. Where Assessed - Upper Body Bathing: Sitting, chair Lower Body Bathing: Not assessed;Other (comment) (Pt declined LB bathing.) Upper Body Dressing: Simulated;Independent Lower Body Dressing: Not assessed Toilet Transfer: Not assessed;Other (comment) (Pt declined transfer out of chair at this time.) Toileting - Clothing Manipulation: Not assessed Where Assessed - Toileting Clothing Manipulation: Not assessed Toileting - Hygiene: Not assessed Tub/Shower Transfer: Not assessed ADL Comments: Limited eval at this  time due to pt declining to transfer out of chair at this time.  Pt will require mod-max assist for LB bathing/dressing due to posterior hip precautions. May benefit from AE education. Vision/Perception    Cognition Cognition Arousal/Alertness: Awake/alert Overall Cognitive Status: Appears within functional limits for tasks assessed Orientation Level: Oriented X4 Sensation/Coordination   Extremity Assessment RUE Assessment RUE Assessment: Within Functional Limits LUE Assessment LUE Assessment: Within Functional Limits Mobility  Bed Mobility Bed  Mobility: No Transfers Transfers: No Exercises   End of Session OT - End of Session Activity Tolerance: Patient tolerated treatment well Patient left: in chair;with call bell in reach General Behavior During Session: [facility] for tasks performed Cognition: [facility] for tasks performed   Cipriano Mile 12/09/2010, 3:43 PM  12/09/2010 Cipriano Mile OTR/L Pager [phone] Office [phone]

## 2010-12-09 NOTE — Progress Notes (Signed)
Physical Therapy Evaluation Patient Details Name: [name] MRN: [id number] DOB: [birth year] Today's Date: 12/09/2010  Problem List:  Patient Active Problem List  Diagnoses  . Hypertension  . Hypercholesterolemia  . Hip fracture, left    Past Medical History:  Past Medical History  Diagnosis Date  . Hypertension   . Gout   . High cholesterol    Past Surgical History: History reviewed. No pertinent past surgical history.  PT Assessment/Plan/Recommendation PT Assessment Clinical Impression Statement: Pt presents with a medical diagnosis of L bipolar hip arthroplasty along with the impairments/deficits listed below. Pt will benefit from skilled PT in the acute care setting in order to maximize mobility in order to d/c to next venue of care safely.  PT Recommendation/Assessment: Patient will need skilled PT in the acute care venue PT Problem List: Decreased strength;Decreased range of motion;Decreased activity tolerance;Decreased balance;Decreased mobility;Decreased knowledge of use of DME;Decreased knowledge of precautions;Pain;Decreased safety awareness Barriers to Discharge: Inaccessible home environment PT Therapy Diagnosis : Difficulty walking;Acute pain PT Plan PT Frequency: 7X/week PT Treatment/Interventions: DME instruction;Gait training;Functional mobility training;Therapeutic exercise;Balance training;Patient/family education PT Recommendation Follow Up Recommendations: Skilled nursing facility Equipment Recommended: Defer to next venue PT Goals  Acute Rehab PT Goals PT Goal Formulation: With patient Time For Goal Achievement: 7 days Pt will go Supine/Side to Sit: with min assist PT Goal: Supine/Side to Sit - Progress: Progressing toward goal Pt will go Sit to Supine/Side: with min assist PT Goal: Sit to Supine/Side - Progress: Progressing toward goal Pt will Transfer Sit to Stand/Stand to Sit: with supervision PT Transfer Goal: Sit to Stand/Stand to Sit -  Progress: Progressing toward goal Pt will Transfer Bed to Chair/Chair to Bed: with min assist PT Transfer Goal: Bed to Chair/Chair to Bed - Progress: Progressing toward goal Pt will Ambulate: 51 - 150 feet;with supervision;with rolling walker PT Goal: Ambulate - Progress: Progressing toward goal Pt will Perform Home Exercise Program: with supervision, verbal cues required/provided PT Goal: Perform Home Exercise Program - Progress: Progressing toward goal  PT Evaluation Precautions/Restrictions  Precautions Precautions: Posterior Hip Restrictions Weight Bearing Restrictions: Yes LLE Weight Bearing: Weight bearing as tolerated Prior Functioning  Home Living Lives With: Family;Spouse Receives Help From: Family Type of Home: House Home Layout: One level Home Access: Stairs to enter Entrance Stairs-Rails: None Entrance Stairs-Number of Steps: 3 Bathroom Shower/Tub: Engineer, manufacturing systems: Standard Home Adaptive Equipment: Tub transfer bench;Walker - rolling;Straight cane;Crutches;Bedside commode/3-in-1 Prior Function Level of Independence: Independent with basic ADLs;Independent with gait;Independent with homemaking with ambulation;Independent with transfers Able to Take Stairs?: Yes Driving: Yes Vocation: Retired Producer, television/film/video: Awake/alert Overall Cognitive Status: Appears within functional limits for tasks assessed Orientation Level: Oriented X4 Sensation/Coordination Sensation Light Touch: Impaired by gross assessment (decreased sensation on entire LLE, but able to feel) Extremity Assessment RLE Assessment RLE Assessment: Within Functional Limits LLE Assessment LLE Assessment: Exceptions to WFL LLE AROM (degrees) Overall AROM Left Lower Extremity: Deficits;Due to pain;Due to precautions (Ankle and knee WFL) LLE Strength LLE Overall Strength: Deficits;Due to precautions;Due to pain (Knee and Ankle WFL) Mobility (including Balance) Bed  Mobility Bed Mobility: Yes Supine to Sit: 2: Max assist Supine to Sit Details (indicate cue type and reason): VC for sequencing and hand placement. Assist of LLE as well as trunk control into sitting.  Transfers Transfers: Yes Sit to Stand: 3: Mod assist;From bed Sit to Stand Details (indicate cue type and reason): VC for hand placement Stand to Sit: 3: Mod assist;To chair/3-in-1 Stand to Sit  Details: Uncontrolled descent into chair. VC for hand placement and sequencing with LLE Stand Pivot Transfers: 2: Max Actuary Details (indicate cue type and reason): VC for sequencing of LEs throughout transfer Ambulation/Gait Ambulation/Gait: No  Balance Balance Assessed: Yes Static Standing Balance Static Standing - Level of Assistance: 3: Mod assist (Assist to maintain standing posture. Pt tended to lean R) Static Standing - Comment/# of Minutes: 5  (static standing while nurse was changing dressing. ) Exercise  Total Joint Exercises Ankle Circles/Pumps: PROM;10 reps;Supine;Both Quad Sets: PROM;10 reps;Right;Supine End of Session PT - End of Session Equipment Utilized During Treatment: Gait belt Activity Tolerance: Patient limited by fatigue;Patient limited by pain Patient left: in chair Nurse Communication: Mobility status for transfers;Mobility status for ambulation General Behavior During Session: [facility] for tasks performed Cognition: [facility] for tasks performed  Milana Kidney 12/09/2010, 9:51 AM

## 2010-12-09 NOTE — Progress Notes (Signed)
Clinical Social Work-CSW completed FL2, initiated bed search and left messages for pt SNF of choice; FL2 in shadow chart for signature. CSW will follow and provide pt with bed offers. Jodean Lima, [phone]

## 2010-12-09 NOTE — Progress Notes (Addendum)
Pt admitted for L hip hemiarthroplasty as per rept. Hemovac of L hip, per rept, was d/ced this AM. Pt noted to have a small shadow stain of his dressing of his L hip but also a bloody blister adjacent dressing of L hip the size of aprox a golf ball. Blister is superficial. Blister is oozing small amount of bldy drainage so dry dressing applied. No s/sx infection noted of site. Pt has no pressure skin issues of heels or sacral area but pt refuses to turn/tilt self or float heels. Pt c/o L knee and L ankle pain. Pt repts that he has a history of gout. Pt not noted to have any reddness or swelling of joints. Will continue to monitor. Pt is HOH and wears very thick glasses but neuro check is otherwise negative. Pt can MAEx 4 and pt is alert and oriented x 3. Pt noted to be SOB with exertion. IS strongly encouraged. Pt verb understanding and agrees to comply. Pt sats 99% RA.

## 2010-12-09 NOTE — Progress Notes (Signed)
[name] UYQ:[id number],GLO:[id number] is a 77 y.o. male,  Outpatient Primary MD for the patient is Kirk Ruths, MD Chief Complaint  Patient presents with  . Hip Injury   12/09/2010    Assessment & Plan   1. HTN Blood pressure - appears to be in good control on B blocker , Norvasc and pain control continue. Will follow.  2. Hyperlipidemia. We will continue the patient on his home meds.   3. Gout. Is on Chr Prednisone Po continue, stress dose steroid stopped along with IVF, monitor BP and Lytes, Holding parameters for BP meds written.  4. The patient suffered a TIA last July. He states that he has had no symptoms since that time.continue statin,B blocker & Add full dose ASA once stable from Ortho standpoint.  5. Hip Fracture. - post ORIF on 12-08-10 - per Ortho, agree with PT and IS.  6. K stable - DC'd IVF, monitor in am.  7. Mildly sleepy in chair - reduced pain meds and Muscle relaxants, DC reglan, monitor pain control.  CXR and UA stable.  DVT prophylaxis Lovenox per Ortho  Lab Results  Component Value Date   PLT 111* 12/09/2010     Subjective:   [name] today has, No headache, No chest pain, No abdominal pain - No Nausea, No new weakness tingling or numbness, No Cough - SOB.  Slightly sleepy in chair.  Objective:   Vital signs in last 24 hours:  Filed Vitals:   12/08/10 2200 12/09/10 0200 12/09/10 0545 12/09/10 0700  BP: 121/69  140/75 126/70  Pulse: 80  82 85  Temp: 97.6 F (36.4 C) 97.8 F (36.6 C) 98.3 F (36.8 C)   TempSrc:      Resp: 20  16 18   SpO2: 93%  92% 99%    Intake/Output from previous day:   Intake/Output Summary (Last 24 hours) at 12/09/10 1115 Last data filed at 12/09/10 0900  Gross per 24 hour  Intake   2725 ml  Output   1000 ml  Net   1725 ml   Exam Awake Alert, Oriented *3, No new F.N deficits, Normal affect [name].AT,PERRAL Supple Neck,No JVD, No cervical lymphadenopathy appriciated.  Symmetrical Chest wall  movement, Good air movement bilaterally, CTAB RRR,No Gallops,Rubs or new Murmurs, No Parasternal Heave +ve B.Sounds, Abd Soft, Non tender, No organomegaly appriciated, No rebound -guarding or rigidity. No Cyanosis, Clubbing or edema, No new Rash or bruise, L leg in splint   Data Review   Radiology Reports  Dg Chest 1 View  12/07/2010  *RADIOLOGY REPORT*  Clinical Data: Fall, pain  CHEST - 1 VIEW  Comparison: Head CT 09/15/2010  Findings: Normal mediastinum and cardiac silhouette.  Normal pulmonary  vasculature.  No evidence of effusion, infiltrate, or pneumothorax.  No acute bony abnormality.  IMPRESSION: No acute cardiopulmonary process.  Original Report Authenticated By: Genevive Bi, M.D.   Dg Hip Complete Left  12/07/2010  *RADIOLOGY REPORT*  Clinical Data: Fall today, left hip pain  LEFT HIP - COMPLETE 2+ VIEW  Comparison: None.  Findings: There is a fracture of the left femoral neck which is basicervical versus sub capital.  There is varus angulation.  No joint is intact.  IMPRESSION:  Acute left femoral neck fracture.  Per CMS PQRS reporting requirements (PQRS Measure 24): Given the patient's age of greater than 50 and the fracture site (hip, distal radius, or spine), the patient should be tested for osteoporosis using DXA, and the appropriate treatment considered based on the DXA  results.  Original Report Authenticated By: Genevive Bi, M.D.   Dg Knee 1-2 Views Left  12/07/2010  *RADIOLOGY REPORT*  Clinical Data: Pain, left hip fx  LEFT KNEE - 1-2 VIEW  Comparison: None  Findings: No evidence of fracture dislocation or the left knee. There is joint space narrowing of the medial and  lateral compartment .  IMPRESSION: No  fracture.  Original Report Authenticated By: Genevive Bi, M.D.   Dg Pelvis Portable  12/08/2010  *RADIOLOGY REPORT*  Clinical Data: Status post left hip hemiarthroplasty; postoperative radiograph.  PORTABLE PELVIS  Comparison: Left hip radiographs performed  12/07/2010  Findings: The patient's left hip hemiarthroplasty is noted in expected alignment, without evidence of loosening or fracture.  The femoral stem is unremarkable in appearance.  No new fractures are seen.  The right hip is unremarkable in appearance.  The sacroiliac joints are difficult to fully characterize but appear grossly unremarkable.  The visualized bowel gas pattern is within normal limits.  A drainage catheter is noted overlying the prosthesis, with scattered overlying soft tissue air.  IMPRESSION: Left hip hemiarthroplasty noted in expected alignment, without evidence of loosening or fracture.  Original Report Authenticated By: Tonia Ghent, M.D.     Lab Results:  Micro Results: Recent Results (from the past 240 hour(s))  MRSA PCR SCREENING     Status: Abnormal   Collection Time   12/08/10  2:44 AM      Component Value Range Status Comment   MRSA by PCR POSITIVE (*) NEGATIVE  Final      Basename 12/09/10 0620 12/07/10 1528  NA 142 141  K 4.9 3.4*  CL 108 106  CO2 27 27  GLUCOSE 101* 118*  BUN 15 18  CREATININE 1.01 1.19  CALCIUM 9.4 9.8  MG -- --  PHOS -- --    Basename 12/07/10 1528  AST 25  ALT 27  ALKPHOS 69  BILITOT 0.5  PROT 7.1  ALBUMIN 3.8   No results found for this basename: LIPASE:2,AMYLASE:2 in the last 72 hours  Basename 12/09/10 0620 12/07/10 1528  WBC 11.5* 10.6*  NEUTROABS -- 8.2*  HGB 12.2* 13.7  HCT 36.8* 40.7  MCV 100.8* 100.2*  PLT 111* 135*   Medications: Scheduled Meds:    . amLODipine  5 mg Oral Daily  . ceFAZolin (ANCEF) IV  2 g Intravenous 60 min Pre-Op  . Chlorhexidine Gluconate Cloth  6 each Topical BID  . docusate sodium  100 mg Oral BID  . enoxaparin  40 mg Subcutaneous Q24H  . ferrous sulfate  325 mg Oral TID PC  . fluticasone  2 spray Each Nare Daily  . metoprolol  50 mg Oral BID  . predniSONE  10 mg Oral BID  . simvastatin  20 mg Oral QHS  . DISCONTD: hydrocortisone sod succinate (SOLU-CORTEF) injection   100 mg Intravenous Q8H  . DISCONTD: vancomycin  1,000 mg Intravenous Once   Continuous Infusions:    . dextrose 5 % and 0.45 % NaCl with KCl 20 mEq/L 50 mL/hr at 12/08/10 1347  . DISCONTD: dextrose 5 % and 0.45% NaCl 85 mL/hr at 12/08/10 1202  . DISCONTD: lactated ringers    . DISCONTD: sodium chloride 0.9 % 1,000 mL with potassium chloride 10 mEq infusion     PRN Meds:.acetaminophen, acetaminophen, alum & mag hydroxide-simeth, bisacodyl, bisacodyl, cloNIDine, HYDROcodone-acetaminophen, magnesium hydroxide, menthol-cetylpyridinium, methocarbamol, ondansetron (ZOFRAN) IV, ondansetron, phenol, polyethylene glycol, sodium phosphate, DISCONTD: HYDROcodone-acetaminophen, DISCONTD: HYDROmorphone, DISCONTD: methocarbamol(ROBAXIN) IV, DISCONTD: methocarbamol(ROBAXIN) IV, DISCONTD: methocarbamol  DISCONTD: metoCLOPramide (REGLAN) injection, DISCONTD: metoCLOPramide, DISCONTD: morphine, DISCONTD: ondansetron (ZOFRAN) IV, DISCONTD: oxyCODONE-acetaminophen, DISCONTD: sodium chloride

## 2010-12-10 ENCOUNTER — Encounter (HOSPITAL_COMMUNITY): Payer: Self-pay | Admitting: Orthopedic Surgery

## 2010-12-10 LAB — BASIC METABOLIC PANEL
CO2: 27 mEq/L (ref 19–32)
Calcium: 9 mg/dL (ref 8.4–10.5)
GFR calc non Af Amer: 83 mL/min — ABNORMAL LOW (ref >90)
Potassium: 4.5 mEq/L (ref 3.5–5.1)
Sodium: 139 mEq/L (ref 135–145)

## 2010-12-10 LAB — CBC
Hemoglobin: 11.6 g/dL — ABNORMAL LOW (ref 13.0–17.0)
MCH: 32.8 pg (ref 26.0–34.0)
Platelets: 113 10*3/uL — ABNORMAL LOW (ref 150–400)
RBC: 3.54 MIL/uL — ABNORMAL LOW (ref 4.22–5.81)

## 2010-12-10 NOTE — Progress Notes (Signed)
Clinical Social Work-CSW following for placement. [facility] has no beds available and unable to accept pt. CSW awaiting call back from pt second choice-[facility] and will notify pt and treatment team as soon as a response is received. Jodean Lima, [phone]

## 2010-12-10 NOTE — Progress Notes (Signed)
  POD # 2 from Left hip Hemi  Subjective: Doing well.  No major events.  Ready for rehab  Objective: Vital signs in last 24 hours: Temp:  [98.4 F (36.9 C)-98.6 F (37 C)] 98.6 F (37 C) (11/14 0526) Pulse Rate:  [75-92] 92  (11/14 0526) Resp:  [18] 18  (11/14 0526) BP: (115-140)/(62-77) 140/68 mmHg (11/14 0526) SpO2:  [96 %] 96 % (11/14 0526)   Basename 12/10/10 0532 12/09/10 0620 12/07/10 1528  HGB 11.6* 12.2* 13.7    Basename 12/10/10 0532 12/09/10 0620  WBC 11.5* 11.5*  RBC 3.54* 3.65*  HCT 35.3* 36.8*  PLT 113* 111*    Basename 12/10/10 0532 12/09/10 0620  NA 139 142  K 4.5 4.9  CL 106 108  CO2 27 27  BUN 16 15  CREATININE 0.83 1.01  GLUCOSE 145* 101*  CALCIUM 9.0 9.4    Basename 12/07/10 1858  LABPT --  INR 1.00    Neurovascular intact Incision: dressing C/D/I Bloody drainage from drain site, but not active  Assessment/Plan: PT/OT  SL MIVF SNF [facility] for D/C when available through SW  [name] D 12/10/2010, 7:58 AM

## 2010-12-10 NOTE — Progress Notes (Signed)
Physical Therapy Treatment Patient Details Name: [name] MRN: [id number] DOB: 09-18-1931 Today's Date: 12/10/2010  PT Assessment/Plan  PT - Assessment/Plan Comments on Treatment Session: Pt progressing well, he was able to ambulate increased distance as well as decreased assistance needed throughout mobility.  PT Plan: Discharge plan remains appropriate PT Frequency: 7X/week Follow Up Recommendations: Skilled nursing facility Equipment Recommended: Defer to next venue PT Goals  Acute Rehab PT Goals PT Goal Formulation: With patient Time For Goal Achievement: 7 days PT Goal: Supine/Side to Sit - Progress: Progressing toward goal PT Goal: Sit to Supine/Side - Progress: Progressing toward goal PT Transfer Goal: Sit to Stand/Stand to Sit - Progress: Progressing toward goal PT Transfer Goal: Bed to Chair/Chair to Bed - Progress: Progressing toward goal PT Goal: Ambulate - Progress: Progressing toward goal PT Goal: Perform Home Exercise Program - Progress: Progressing toward goal  PT Treatment Precautions/Restrictions  Precautions Precautions: Posterior Hip Restrictions Weight Bearing Restrictions: Yes LLE Weight Bearing: Weight bearing as tolerated Mobility (including Balance) Transfers Transfers: Yes Sit to Stand: 5: Supervision;From chair/3-in-1 Sit to Stand Details (indicate cue type and reason): VC for posterior hip precautions and hand placement Stand to Sit: 5: Supervision;To chair/3-in-1 Stand to Sit Details: Controlled descent. VC for safety Ambulation/Gait Ambulation/Gait: Yes Ambulation/Gait Assistance: 4: Min assist (Minguard assist) Ambulation/Gait Assistance Details (indicate cue type and reason): VC for even step length as well as safety with RW Ambulation Distance (Feet): 50 Feet Assistive device: Rolling walker Gait Pattern: Decreased step length - right;Decreased stance time - left;Trunk flexed Gait velocity: Decreased gait speed    Exercise    End  of Session PT - End of Session Equipment Utilized During Treatment: Gait belt Activity Tolerance: Patient tolerated treatment well Patient left: in chair;with family/visitor present;with call bell in reach Nurse Communication: Mobility status for transfers;Mobility status for ambulation General Behavior During Session: [facility] for tasks performed Cognition: [facility] for tasks performed  Milana Kidney 12/10/2010, 3:19 PM 12/10/2010 Milana Kidney DPT PAGER: [phone] OFFICE: [phone]

## 2010-12-10 NOTE — Progress Notes (Signed)
Subjective: No complaints. Objective: Vital signs in last 24 hours: Filed Vitals:   12/09/10 0700 12/09/10 1400 12/09/10 2300 12/10/10 0526  BP: 126/70 115/62 127/77 140/68  Pulse: 85 75 86 92  Temp:  98.4 F (36.9 C) 98.4 F (36.9 C) 98.6 F (37 C)  TempSrc:  Oral Oral Oral  Resp: 18 18 18 18   SpO2: 99% 96% 96% 96%   Weight change:   Intake/Output Summary (Last 24 hours) at 12/10/10 1241 Last data filed at 12/10/10 0500  Gross per 24 hour  Intake   1200 ml  Output   3800 ml  Net  -2600 ml   Physical Exam: Unchanged.  Lab Results: Results for orders placed during the hospital encounter of 12/07/10 (from the past 24 hour(s))  CBC     Status: Abnormal   Collection Time   12/10/10  5:32 AM      Component Value Range   WBC 11.5 (*) 4.0 - 10.5 (K/uL)   RBC 3.54 (*) 4.22 - 5.81 (MIL/uL)   Hemoglobin 11.6 (*) 13.0 - 17.0 (g/dL)   HCT 14.7 (*) 82.9 - 52.0 (%)   MCV 99.7  78.0 - 100.0 (fL)   MCH 32.8  26.0 - 34.0 (pg)   MCHC 32.9  30.0 - 36.0 (g/dL)   RDW 56.2  13.0 - 86.5 (%)   Platelets 113 (*) 150 - 400 (K/uL)  BASIC METABOLIC PANEL     Status: Abnormal   Collection Time   12/10/10  5:32 AM      Component Value Range   Sodium 139  135 - 145 (mEq/L)   Potassium 4.5  3.5 - 5.1 (mEq/L)   Chloride 106  96 - 112 (mEq/L)   CO2 27  19 - 32 (mEq/L)   Glucose, Bld 145 (*) 70 - 99 (mg/dL)   BUN 16  6 - 23 (mg/dL)   Creatinine, Ser 7.84  0.50 - 1.35 (mg/dL)   Calcium 9.0  8.4 - 69.6 (mg/dL)   GFR calc non Af Amer 83 (*) >90 (mL/min)   GFR calc Af Amer >90  >90 (mL/min)    Micro Results: Recent Results (from the past 240 hour(s))  MRSA PCR SCREENING     Status: Abnormal   Collection Time   12/08/10  2:44 AM      Component Value Range Status Comment   MRSA by PCR POSITIVE (*) NEGATIVE  Final    Studies/Results: Dg Pelvis Portable  12/08/2010  *RADIOLOGY REPORT*  Clinical Data: Status post left hip hemiarthroplasty; postoperative radiograph.  PORTABLE PELVIS   Comparison: Left hip radiographs performed 12/07/2010  Findings: The patient's left hip hemiarthroplasty is noted in expected alignment, without evidence of loosening or fracture.  The femoral stem is unremarkable in appearance.  No new fractures are seen.  The right hip is unremarkable in appearance.  The sacroiliac joints are difficult to fully characterize but appear grossly unremarkable.  The visualized bowel gas pattern is within normal limits.  A drainage catheter is noted overlying the prosthesis, with scattered overlying soft tissue air.  IMPRESSION: Left hip hemiarthroplasty noted in expected alignment, without evidence of loosening or fracture.  Original Report Authenticated By: Tonia Ghent, M.D.   Medications:  Scheduled Meds:   . amLODipine  5 mg Oral Daily  . Chlorhexidine Gluconate Cloth  6 each Topical BID  . docusate sodium  100 mg Oral BID  . enoxaparin  40 mg Subcutaneous Q24H  . ferrous sulfate  325 mg Oral TID PC  .  fluticasone  2 spray Each Nare Daily  . metoprolol  50 mg Oral BID  . mupirocin   Nasal BID  . predniSONE  10 mg Oral BID  . simvastatin  20 mg Oral QHS   Continuous Infusions:  PRN Meds:.acetaminophen, acetaminophen, alum & mag hydroxide-simeth, bisacodyl, bisacodyl, cloNIDine, HYDROcodone-acetaminophen, magnesium hydroxide, menthol-cetylpyridinium, methocarbamol, ondansetron (ZOFRAN) IV, ondansetron, phenol, polyethylene glycol, sodium phosphate Assessment/Plan: 1. HTN Blood pressure - systolic BP slightly higher, would continue to monitor  on B blocker , Norvasc and pain control. Will follow. Its one value of higher systolic BP, would not make any changes to the blood pressure yet.  2. Hyperlipidemia. We will continue the patient on his home meds.  3. Gout. Is on Chr Prednisone Po continue, stress dose steroid stopped along with IVF, monitor BP and Lytes, Holding parameters for BP meds written.  4. The patient suffered a TIA last July. He states that he has  had no symptoms since that time.continue statin,B blocker & Add full dose ASA once stable from Ortho standpoint.  5. Hip Fracture. - post ORIF on 12-08-10 - per Ortho, agree with PT and IS.  6. K stable - DC'd IVF, monitor in am.   CXR and UA stable.  DVT prophylaxis Lovenox per Ortho  [name] 12/10/2010, 12:41 PM

## 2010-12-10 NOTE — Progress Notes (Signed)
Clinical Social Work-CSW recd confirmation from [facility] SNF-they will be able to accept pt tomorrow. Family notified -niece is point of contact and will complete paperwork at facility prior to d/c and communicate with family members. CSW will follow and facilitate d/c. Jodean Lima, [phone]

## 2010-12-10 NOTE — Progress Notes (Signed)
Physical Therapy Treatment Patient Details Name: [name] MRN: [id number] DOB: May 03, 1931 Today's Date: 12/10/2010  PT Assessment/Plan  PT - Assessment/Plan Comments on Treatment Session: Pt progressing well. Pt still requires increased assistance with bed mobility, although ambulated well with minimal assistance. Will attempt increased distance next session PT Plan: Discharge plan remains appropriate PT Frequency: 7X/week Follow Up Recommendations: Skilled nursing facility Equipment Recommended: Defer to next venue PT Goals  Acute Rehab PT Goals PT Goal Formulation: With patient Time For Goal Achievement: 7 days PT Goal: Supine/Side to Sit - Progress: Progressing toward goal PT Goal: Sit to Supine/Side - Progress: Progressing toward goal PT Transfer Goal: Sit to Stand/Stand to Sit - Progress: Progressing toward goal PT Transfer Goal: Bed to Chair/Chair to Bed - Progress: Progressing toward goal PT Goal: Ambulate - Progress: Progressing toward goal PT Goal: Perform Home Exercise Program - Progress: Progressing toward goal  PT Treatment Precautions/Restrictions  Precautions Precautions: Posterior Hip Restrictions Weight Bearing Restrictions: Yes LLE Weight Bearing: Weight bearing as tolerated Mobility (including Balance) Bed Mobility Bed Mobility: Yes Supine to Sit: 2: Max assist Supine to Sit Details (indicate cue type and reason): VC for sequencing and hand placement. Assist of LLE and trunk into sitting for balance support. Pt able to scoot to EOB with minimal assist of the drawsheet on the left side Transfers Transfers: Yes Sit to Stand: 4: Min assist;From bed;From elevated surface (elevated ~2 inches) Sit to Stand Details (indicate cue type and reason): VC for hand placement. Stand to Sit: 4: Min assist;To chair/3-in-1 Stand to Sit Details: VC for hand placement Ambulation/Gait Ambulation/Gait: Yes Ambulation/Gait Assistance: 4: Min assist Ambulation/Gait  Assistance Details (indicate cue type and reason): VC for sequencing and safety with RW Ambulation Distance (Feet): 15 Feet Assistive device: Rolling walker Gait Pattern: Step-to pattern;Decreased step length - right;Decreased stance time - left;Right circumduction;Trunk flexed Gait velocity: Decreased gait speed Stairs: No    Exercise  Total Joint Exercises Heel Slides: Supine;10 reps;Strengthening;Left;AROM Hip ABduction/ADduction: AROM;Strengthening;Left;10 reps;Supine Straight Leg Raises: Supine;10 reps;Left;Strengthening;AROM Long Arc Quad: AROM;Left;10 reps;Strengthening;Seated Marching in Standing: AROM;Seated;Left;Strengthening;10 reps (seated marches) End of Session PT - End of Session Equipment Utilized During Treatment: Gait belt Activity Tolerance: Patient tolerated treatment well Patient left: in chair;with family/visitor present;with call bell in reach Nurse Communication: Mobility status for transfers;Mobility status for ambulation General Behavior During Session: [facility] for tasks performed Cognition: [facility] for tasks performed  Milana Kidney 12/10/2010, 10:07 AM

## 2010-12-11 MED ORDER — ASPIRIN 325 MG PO TABS: 325.0000 mg | ORAL_TABLET | Freq: Every day | ORAL | Status: DC

## 2010-12-11 MED ORDER — POLYETHYLENE GLYCOL 3350 17 G PO PACK: 17.0000 g | PACK | Freq: Every day | ORAL | Status: DC | PRN

## 2010-12-11 MED ORDER — AMLODIPINE BESYLATE 10 MG PO TABS: 10.0000 mg | ORAL_TABLET | Freq: Every day | ORAL | Status: DC

## 2010-12-11 MED ORDER — DSS 100 MG PO CAPS: 100.0000 mg | ORAL_CAPSULE | Freq: Two times a day (BID) | ORAL | Status: AC

## 2010-12-11 MED ORDER — ENOXAPARIN SODIUM 40 MG/0.4ML ~~LOC~~ SOLN: 40.0000 mg | SUBCUTANEOUS | Status: DC

## 2010-12-11 MED ORDER — POLYETHYLENE GLYCOL 3350 17 G PO PACK: 17.0000 g | PACK | Freq: Two times a day (BID) | ORAL | Status: AC

## 2010-12-11 MED ORDER — FERROUS SULFATE 325 (65 FE) MG PO TABS: 325.0000 mg | ORAL_TABLET | Freq: Three times a day (TID) | ORAL | Status: DC

## 2010-12-11 MED ORDER — MUPIROCIN 2 % EX OINT: TOPICAL_OINTMENT | Freq: Two times a day (BID) | CUTANEOUS | Status: AC

## 2010-12-11 MED ORDER — METHOCARBAMOL 500 MG PO TABS: 500.0000 mg | ORAL_TABLET | Freq: Four times a day (QID) | ORAL | Status: AC | PRN

## 2010-12-11 MED ORDER — HYDROCODONE-ACETAMINOPHEN 5-325 MG PO TABS: 1.0000 | ORAL_TABLET | ORAL | Status: AC | PRN

## 2010-12-11 NOTE — Progress Notes (Signed)
Physical Therapy Treatment Patient Details Name: [name] MRN: [id number] DOB: Sep 15, 1931 Today's Date: 12/11/2010  PT Assessment/Plan  PT - Assessment/Plan Comments on Treatment Session: Pt progressing well, increasing ambulation distance today. Pt still hesitant to bear weight through LLE for increased time period during gait.  PT Plan: Discharge plan remains appropriate PT Frequency: 7X/week Follow Up Recommendations: Skilled nursing facility Equipment Recommended: Defer to next venue PT Goals  Acute Rehab PT Goals PT Goal Formulation: With patient Time For Goal Achievement: 7 days PT Goal: Supine/Side to Sit - Progress: Progressing toward goal PT Goal: Sit to Supine/Side - Progress: Progressing toward goal PT Transfer Goal: Sit to Stand/Stand to Sit - Progress: Progressing toward goal PT Transfer Goal: Bed to Chair/Chair to Bed - Progress: Progressing toward goal PT Goal: Ambulate - Progress: Progressing toward goal PT Goal: Perform Home Exercise Program - Progress: Progressing toward goal  PT Treatment Precautions/Restrictions  Precautions Precautions: Posterior Hip Restrictions Weight Bearing Restrictions: Yes LLE Weight Bearing: Weight bearing as tolerated Mobility (including Balance) Bed Mobility Bed Mobility: No Transfers Transfers: Yes Sit to Stand: 5: Supervision;From chair/3-in-1 Sit to Stand Details (indicate cue type and reason): VC for hand placement Stand to Sit: To chair/3-in-1;5: Supervision Stand to Sit Details: VC for hand placement. Controlled descent  Ambulation/Gait Ambulation/Gait: Yes Ambulation/Gait Assistance: 5: Supervision (Supervision/Min Guard Assist) Ambulation/Gait Assistance Details (indicate cue type and reason): VC for even step length. Pt hesitant to bear weight through LLE for increased time period Ambulation Distance (Feet): 100 Feet Assistive device: Rolling walker Gait Pattern: Decreased step length - right;Decreased stance  time - left;Trunk flexed Gait velocity: Decreased gait speed    Exercise  Total Joint Exercises Short Arc Quad: AROM;Strengthening;Left;10 reps;Supine Heel Slides: AROM;Strengthening;Left;10 reps;Supine Hip ABduction/ADduction: AROM;Left;Strengthening;10 reps;Supine Straight Leg Raises: AAROM;Strengthening;Left;10 reps;Supine End of Session PT - End of Session Equipment Utilized During Treatment: Gait belt Activity Tolerance: Patient tolerated treatment well Patient left: in chair;with call bell in reach Nurse Communication: Mobility status for transfers General Behavior During Session: [facility] for tasks performed Cognition: [facility] for tasks performed  [name] 12/11/2010, 10:03 AM  12/11/2010 [name] DPT PAGER: [phone] OFFICE: [phone]

## 2010-12-11 NOTE — Progress Notes (Signed)
Pt has 2 very small skin tears on rt upper back where he scratched the skin off.   No bleeding and no s/s of infection. Occurred last night. Cleansed with NS and tegaderm applied.

## 2010-12-11 NOTE — Progress Notes (Signed)
Clinical Social Work-CSW confirmed d/c with treatment team and facilitated PTAR transport to [facility] SNF-no futher needs. Sign off. Jodean Lima, [phone]

## 2010-12-11 NOTE — Discharge Summary (Signed)
Physician Discharge Summary  Patient ID: KING PINZON MRN: [id number] DOB/AGE: 05/18/1931 77 y.o.  Admit date: 12/07/2010 Discharge date: 12/11/2010  Procedures:  Procedure(s): ARTHROPLASTY BIPOLAR HIP (LEFT)  Attending Physician: Alvy Beal   Admission Diagnoses: Left hip fracture  Discharge Diagnoses:  S/P left hip Hemi-arthroplasty Active Problems:  Hypertension  Hypercholesterolemia  Hip fracture, left   PCP: Kirk Ruths, MD   Discharged Condition: good  Hospital Course:  Patient underwent the above stated procedure on 12/07/2010. Patient tolerated the procedure well and brought to the recovery room in good condition and subsequently to the floor.  POD #1 BP: 121/69 ; Pulse: 80 ; Temp: 97.6 F Pt's foley was removed, as well as the hemovac drain removed. IV was changed to a saline lock. Neurologically intact and incision: dressing C/D/I and no drainage.  POD #2  BP: 140/68 ; Pulse: 92 ; Temp: 98.6 F  Neurovascular intact, incision: dressing C/D/I and bloody drainage from drain site, but not active .       LABS  HGB   11.6*   HCT   35.3*  POD #3  BP: 153/86 ; Pulse: 90 ; Temp: 98 F Neurovascular intact, incision: dressing C/D/I and minimal thigh swelling. No new labs. Pt felt to be doing well and able to be discharged to SNF today.  Discharge Exam: Extremities: Homans sign is negative, no sign of DVT, no edema, redness or tenderness in the calves or thighs and no ulcers, gangrene or trophic changes  Disposition: SNF today Pt is felt to be doing well enough to be discharged today to a SNF today. He is to be WBAT. Daily dressing changes with gauze and tape. Keep the area dry and clean until follow up. Pt will follow up in 2 weeks at [facility]. Pt knows to call with any questions or concerns.  Discharge Orders    Future Orders Please Complete By Expires   Call MD / Call 911      Comments:   If you experience chest pain or shortness  of breath, CALL 911 and be transported to the hospital emergency room.  If you develope a fever above 101 F, pus (white drainage) or increased drainage or redness at the wound, or calf pain, call your surgeon's office.   Constipation Prevention      Comments:   Drink plenty of fluids.  Prune juice may be helpful.  You may use a stool softener, such as Colace (over the counter) 100 mg twice a day.  Use MiraLax (over the counter) for constipation as needed.   Increase activity slowly as tolerated      Discharge instructions      Comments:   Daily dressing changes with gauze and tape. Keep the area dry and clean until follow up. Follow up in 2 weeks at [facility]. Call with any questions or concerns.    Weight Bearing as taught in Physical Therapy      Comments:   Use a walker or crutches as instructed.   Follow the hip precautions as taught in Physical Therapy      Change dressing      Comments:   Change the dressing daily with sterile 4 x 4 inch gauze dressing and paper tape.   TED hose      Comments:   Use stockings (TED hose) for 2 weeks on both leg(s).  You may remove them at night for sleeping.     Current Discharge Medication List  START taking these medications   Details  docusate sodium 100 MG CAPS Take 100 mg by mouth 2 (two) times daily. Qty: 10 capsule    enoxaparin (LOVENOX) 40 MG/0.4ML SOLN Inject 0.4 mLs (40 mg total) into the skin daily. Qty: 11.2 mL, Refills: 0    ferrous sulfate 325 (65 FE) MG tablet Take 1 tablet (325 mg total) by mouth 3 (three) times daily after meals.    HYDROcodone-acetaminophen (NORCO) 5-325 MG per tablet Take 1-2 tablets by mouth every 4 (four) hours as needed for pain. Qty: 120 tablet, Refills: 0    methocarbamol (ROBAXIN) 500 MG tablet Take 1 tablet (500 mg total) by mouth every 6 (six) hours as needed (muscle spasms). Qty: 50 tablet, Refills: 0    mupirocin (BACTROBAN) 2 % ointment Apply topically 2 (two) times  daily. Qty: 22 g    polyethylene glycol (MIRALAX / GLYCOLAX) packet Take 17 g by mouth daily as needed. Qty: 14 each      CONTINUE these medications which have CHANGED   Details  aspirin 325 MG tablet Take 1 tablet (325 mg total) by mouth daily.      CONTINUE these medications which have NOT CHANGED   Details  amLODipine (NORVASC) 5 MG tablet Take 5 mg by mouth daily.      fluticasone (FLONASE) 50 MCG/ACT nasal spray Place 2 sprays into the nose daily.      metoprolol (LOPRESSOR) 50 MG tablet Take 50 mg by mouth 2 (two) times daily.      predniSONE (DELTASONE) 10 MG tablet Take 10 mg by mouth 2 (two) times daily.     simvastatin (ZOCOR) 20 MG tablet Take 20 mg by mouth daily.        STOP taking these medications     Ascorbic Acid (VITAMIN C) 1000 MG tablet      COD LIVER OIL PO      VITAMIN D, ERGOCALCIFEROL, PO      vitamin E 400 UNIT capsule          Signed: Anastasio Auerbach. Margarette Vannatter   PAC  12/11/2010, 7:18 AM

## 2010-12-11 NOTE — Progress Notes (Signed)
  Subjective: 3 Days Post-Op Procedure(s) (LRB): ARTHROPLASTY BIPOLAR HIP (Left)   Patient reports pain as mild.  No other events or complaints, ready to be transferred to facility for continued therapy  Objective:   VITALS:   Filed Vitals:   12/10/10 2220  BP: 153/86  Pulse: 90  Temp: 98 F (36.7 C)  Resp: 16    Neurovascular intact Incision: dressing C/D/I Minimal thigh swelling  LABS  Basename 12/10/10 0532 12/09/10 0620  HGB 11.6* 12.2*  HCT 35.3* 36.8*  WBC 11.5* 11.5*  PLT 113* 111*     Basename 12/10/10 0532 12/09/10 0620  NA 139 142  K 4.5 4.9  BUN 16 15  CREATININE 0.83 1.01  GLUCOSE 145* 101*    No results found for this basename: LABPT:2,INR:2 in the last 72 hours   Assessment/Plan: 3 Days Post-Op Procedure(s) (LRB): ARTHROPLASTY BIPOLAR HIP (Left)   Discharge to SNF  RTC to Dr. Charlann Boxer, [facility], [phone], in 2 weeks for follow up on wound and function.  Call with questions  WBAT LLE, daily dressing changes as needed

## 2011-01-01 ENCOUNTER — Other Ambulatory Visit (HOSPITAL_COMMUNITY): Payer: Self-pay | Admitting: Orthopedic Surgery

## 2011-01-01 ENCOUNTER — Ambulatory Visit (HOSPITAL_COMMUNITY)
Admission: RE | Admit: 2011-01-01 | Discharge: 2011-01-01 | Disposition: A | Discharge status: Home / Self Care | Payer: Medicare Other | Source: Ambulatory Visit | Attending: Orthopedic Surgery | Admitting: Orthopedic Surgery

## 2011-01-01 DIAGNOSIS — I824Y9 Acute embolism and thrombosis of unspecified deep veins of unspecified proximal lower extremity: Secondary | ICD-10-CM | POA: Insufficient documentation

## 2011-01-01 DIAGNOSIS — I82409 Acute embolism and thrombosis of unspecified deep veins of unspecified lower extremity: Secondary | ICD-10-CM

## 2011-01-01 DIAGNOSIS — Z96649 Presence of unspecified artificial hip joint: Secondary | ICD-10-CM | POA: Insufficient documentation

## 2011-01-01 DIAGNOSIS — M79609 Pain in unspecified limb: Secondary | ICD-10-CM | POA: Insufficient documentation

## 2011-01-11 ENCOUNTER — Emergency Department (HOSPITAL_COMMUNITY)
Admission: EM | Admit: 2011-01-11 | Discharge: 2011-01-11 | Disposition: A | Discharge status: Home / Self Care | Payer: Medicare Other | Attending: Emergency Medicine | Admitting: Emergency Medicine

## 2011-01-11 ENCOUNTER — Other Ambulatory Visit: Payer: Self-pay

## 2011-01-11 ENCOUNTER — Encounter (HOSPITAL_COMMUNITY): Payer: Self-pay

## 2011-01-11 DIAGNOSIS — Z862 Personal history of diseases of the blood and blood-forming organs and certain disorders involving the immune mechanism: Secondary | ICD-10-CM | POA: Insufficient documentation

## 2011-01-11 DIAGNOSIS — Z79899 Other long term (current) drug therapy: Secondary | ICD-10-CM | POA: Insufficient documentation

## 2011-01-11 DIAGNOSIS — I10 Essential (primary) hypertension: Secondary | ICD-10-CM | POA: Insufficient documentation

## 2011-01-11 DIAGNOSIS — T50901A Poisoning by unspecified drugs, medicaments and biological substances, accidental (unintentional), initial encounter: Secondary | ICD-10-CM

## 2011-01-11 DIAGNOSIS — T50991A Poisoning by other drugs, medicaments and biological substances, accidental (unintentional), initial encounter: Secondary | ICD-10-CM | POA: Insufficient documentation

## 2011-01-11 DIAGNOSIS — Z8639 Personal history of other endocrine, nutritional and metabolic disease: Secondary | ICD-10-CM | POA: Insufficient documentation

## 2011-01-11 DIAGNOSIS — E789 Disorder of lipoprotein metabolism, unspecified: Secondary | ICD-10-CM | POA: Insufficient documentation

## 2011-01-11 DIAGNOSIS — R5383 Other fatigue: Secondary | ICD-10-CM | POA: Insufficient documentation

## 2011-01-11 DIAGNOSIS — Z7982 Long term (current) use of aspirin: Secondary | ICD-10-CM | POA: Insufficient documentation

## 2011-01-11 DIAGNOSIS — R5381 Other malaise: Secondary | ICD-10-CM | POA: Insufficient documentation

## 2011-01-11 LAB — PROTIME-INR: INR: 3.38 — ABNORMAL HIGH (ref 0.00–1.49)

## 2011-01-11 LAB — COMPREHENSIVE METABOLIC PANEL
AST: 19 U/L (ref 0–37)
Albumin: 2.9 g/dL — ABNORMAL LOW (ref 3.5–5.2)
Calcium: 9.3 mg/dL (ref 8.4–10.5)
Creatinine, Ser: 1.19 mg/dL (ref 0.50–1.35)

## 2011-01-11 LAB — CBC
HCT: 33.8 % — ABNORMAL LOW (ref 39.0–52.0)
Hemoglobin: 11.5 g/dL — ABNORMAL LOW (ref 13.0–17.0)
RBC: 3.45 MIL/uL — ABNORMAL LOW (ref 4.22–5.81)
WBC: 8.5 10*3/uL (ref 4.0–10.5)

## 2011-01-11 MED ORDER — SODIUM CHLORIDE 0.9 % IV BOLUS (SEPSIS): 500.0000 mL | Freq: Once | INTRAVENOUS | Status: DC

## 2011-01-11 MED ORDER — SODIUM CHLORIDE 0.9 % IV BOLUS (SEPSIS)
1000.0000 mL | Freq: Once | INTRAVENOUS | Status: AC
  Administered 2011-01-11: 1000 mL via INTRAVENOUS

## 2011-01-11 NOTE — ED Notes (Signed)
Pt reports this am when got out of bed around 0930 left arm was numb.  Pt says it didn't last very long, approx 30sec.  Pt now c/o indigestion, left hip pain, and generalized weakness.  Reports hip hurts because of surgery and reports has indigestion every day.

## 2011-01-11 NOTE — ED Notes (Signed)
Pt says accidentally took his wife's medication this morning around 0900.  Pt c/o feeling generally weak, nervous, and has had several episodes of diarrhea.  PT alert and oriented.

## 2011-01-11 NOTE — ED Notes (Signed)
Pt took amlodipine 5mg , citalopram20mg , ferrex 150mg , isosorbide 20mg , hydralazine 100mg , 75mg  metoprolol.

## 2011-01-11 NOTE — ED Provider Notes (Signed)
This 77 year old male accidentally took his wife's blood pressure and cardiac medicines this morning about 8 or 9:00 and has been urinating frequently since that time since his wife is on a diuretic and he is not. He feels generalized weakness.  I saw and evaluated the patient, reviewed the resident's note and I agree with the findings and plan.  Pt feels improved after observation and IVF treatment in ED with baseline ability to stand without dizziness.  I doubt any other EMC precluding discharge at this time including, but not necessarily limited to the following:severe toxicity from accidental ingestion.  Patient / Family / Caregiver informed of clinical course, understand medical decision-making process, and agree with plan.  ECG: NSR, rate 90, LAFB, LAD, RBBB, no significant change from [id number]  Hurman Horn, MD 01/11/11 2142

## 2011-01-11 NOTE — ED Provider Notes (Signed)
History     CSN: [id number] Arrival date & time: 01/11/2011  1:45 PM   None     Chief Complaint  Patient presents with  . Drug Overdose    (Consider location/radiation/quality/duration/timing/severity/associated sxs/prior treatment) HPI  [name] is a 77 y/o man with history of HTN, gout, HLD who presents after accidentally taking his wife medications this morning INSTEAD of his own.  A family member laid out the pills before him and said they were his, so he just took them without thinking.  Since, he has had worsening of his baseline indigestion, frequent loose stools, and has been feeling weak and shaky.  Since taking the wrong medications, he has taken mylanta to help with indigestion but none of his regular morning medicines.  To the best of family member's knowledge, he took the following his his wife's medicines this morning: Citalopram 20mg  PO one tablet Amlodipine 5mg  PO one tab Hydralazine 100mg  PO one tab Torsemide 20mg  PO two tabs Calcium Acetate Isosorbide 20mg  PO two tabs Metoprolol 50mg  PO one and one half tabs Iron Stool softener (exact medication unknown)  His usual morning medications are (he did not take any of these except prednisone): Amlodipine 5mg   ASA 325mg  Iron Flonase Metoprolol 50mg   Simvastatin 20mg  Prednisone 10mg  (he did take this this morning bc it is separate from other meds)    Past Medical History  Diagnosis Date  . Hypertension   . Gout   . High cholesterol     Past Surgical History  Procedure Date  . Hip arthroplasty 12/08/2010    Procedure: ARTHROPLASTY BIPOLAR HIP;  Surgeon: [name];  Location: [facility] OR;  Service: Orthopedics;  Laterality: Left;    No family history on file.  History  Substance Use Topics  . Smoking status: Former Games developer  . Smokeless tobacco: Current User  . Alcohol Use: No      Review of Systems No HA, CP, constipation, numbness, tingling, confusion.  Some epigastric pain that he says is  like worsening of his baseline indigestion.   Allergies  Tetanus toxoids  Home Medications   Current Outpatient Rx  Name Route Sig Dispense Refill  . AMLODIPINE BESYLATE 5 MG PO TABS Oral Take 5 mg by mouth daily.      . ASPIRIN 325 MG PO TABS Oral Take 1 tablet (325 mg total) by mouth daily.      Start EC Aspirin 325mg  once a day after finishing  ...  . FERROUS SULFATE 325 (65 FE) MG PO TABS Oral Take 1 tablet (325 mg total) by mouth 3 (three) times daily after meals.    Marland Kitchen FLUTICASONE PROPIONATE 50 MCG/ACT NA SUSP Nasal Place 2 sprays into the nose daily.      Marland Kitchen METOPROLOL TARTRATE 50 MG PO TABS Oral Take 50 mg by mouth 2 (two) times daily.      Marland Kitchen PREDNISONE 10 MG PO TABS Oral Take 10 mg by mouth 2 (two) times daily.     Marland Kitchen SIMVASTATIN 20 MG PO TABS Oral Take 20 mg by mouth daily.      Warfarin  BP 118/65  Pulse 84  Temp(Src) 98.3 F (36.8 C) (Oral)  Resp 20  Ht 5\' 11"  (1.803 m)  Wt 238 lb (107.956 kg)  BMI 33.19 kg/m2  SpO2 95%  Physical Exam  General: alert, well-developed, and cooperative to examination.  Head: normocephalic and atraumatic.  Eyes: vision grossly intact, pupils equal, pupils round, pupils reactive to light, no injection and anicteric.  Mouth: pharynx pink and moist, no erythema, and no exudates.  Neck: no JVD. Lungs: normal respiratory effort, no accessory muscle use, normal breath sounds, no crackles, and no wheezes. Heart: normal rate, regular rhythm, no murmur, no gallop, and no rub.   Abdomen: soft, non-tender, normal bowel sounds, no distention, no guarding, no rebound tenderness, no hepatomegaly, and no splenomegaly.   Pulses: 2+ DP/PT pulses bilaterally Extremities: No cyanosis, clubbing. 1+ pitting edema to knee in L leg, no edema R leg. Neurologic: alert & oriented X3, cranial nerves II-XII intact, strength normal in arms bilaterally and R leg.  L leg strength limited by knee pain (patient attributes to gout)]  Skin: turgor normal and no  rashes.    ED Course  Procedures (including critical care time)  Labs Reviewed  COMPREHENSIVE METABOLIC PANEL - Abnormal; Notable for the following:    Sodium 129 (*)    Glucose, Bld 218 (*)    Albumin 2.9 (*)    GFR calc non Af Amer 57 (*)    GFR calc Af Amer 66 (*)    All other components within normal limits  PROTIME-INR - Abnormal; Notable for the following:    Prothrombin Time 34.7 (*)    INR 3.38 (*)    All other components within normal limits  CBC   Admission on 01/11/2011  Component Date Value Range Status  . Sodium (mEq/L) 01/11/2011 129* 135-145 Final  . Potassium (mEq/L) 01/11/2011 3.8  3.5-5.1 Final  . Chloride (mEq/L) 01/11/2011 96  96-112 Final  . CO2 (mEq/L) 01/11/2011 25  19-32 Final  . Glucose, Bld (mg/dL) 46/96/2952 841* 32-44 Final  . BUN (mg/dL) 01/28/7251 20  6-64 Final  . Creatinine, Ser (mg/dL) 40/34/7425 9.56  3.87-5.64 Final  . Calcium (mg/dL) 33/29/5188 9.3  4.1-66.0 Final  . Total Protein (g/dL) 63/01/6008 6.1  9.3-2.3 Final  . Albumin (g/dL) 55/73/2202 2.9* 5.4-2.7 Final  . AST (U/L) 01/11/2011 19  0-37 Final  . ALT (U/L) 01/11/2011 25  0-53 Final  . Alkaline Phosphatase (U/L) 01/11/2011 111  39-117 Final  . Total Bilirubin (mg/dL) 07/19/7626 0.3  3.1-5.1 Final  . GFR calc non Af Amer (mL/min) 01/11/2011 57* >90 Final  . GFR calc Af Amer (mL/min) 01/11/2011 66* >90 Final   Comment:                                 The eGFR has been calculated                          using the CKD EPI equation.                          This calculation has not been                          validated in all clinical                          situations.                          eGFR's persistently                          <90 mL/min signify  possible Chronic Kidney Disease.  Marland Kitchen Prothrombin Time (seconds) 01/11/2011 34.7* 11.6-15.2 Final  . INR  01/11/2011 3.38* 0.00-1.49 Final  ]  1L NS given  Diagnosis: Accident Medication  overdose, Hyponatremia   MDM  I discussed all aspects of this case with my attending Dr. Fonnie Jarvis who interviewed and examined the patient and agreed with the plan of care.    His is mildly hypotensive from taking his wife's BP medicines and mildly hyponatremia, likely from volume depletion from taking his wife's diuretic and frequent loose stools today.  1L NS bolus given, will watch how BP responds.  Plan is to watch for 3-4 more hours to make sure blood pressures are stable and allow time for torsemide, hydralazine, imdur to wear off .   Would check orthostatic vital signs before he leaves.    At this point care is being transferred to my attending Dr. Carles Collet, MD 01/11/11 [phone]

## 2011-01-11 NOTE — ED Notes (Signed)
Pt denies any difficulty swallowing, MD says ok for pt to drink water.  Water given.  Pt drank without difficulty.

## 2011-01-27 DIAGNOSIS — C61 Malignant neoplasm of prostate: Secondary | ICD-10-CM

## 2011-01-27 HISTORY — DX: Malignant neoplasm of prostate: C61

## 2011-05-27 ENCOUNTER — Emergency Department (HOSPITAL_COMMUNITY): Payer: Medicare Other

## 2011-05-27 ENCOUNTER — Inpatient Hospital Stay (HOSPITAL_COMMUNITY)
Admission: EM | Admit: 2011-05-27 | Discharge: 2011-05-31 | DRG: 501 | Disposition: A | Discharge status: Home / Self Care | Payer: Medicare Other | Attending: Internal Medicine | Admitting: Internal Medicine

## 2011-05-27 ENCOUNTER — Encounter (HOSPITAL_COMMUNITY): Payer: Self-pay | Admitting: *Deleted

## 2011-05-27 DIAGNOSIS — L03119 Cellulitis of unspecified part of limb: Secondary | ICD-10-CM

## 2011-05-27 DIAGNOSIS — L02519 Cutaneous abscess of unspecified hand: Secondary | ICD-10-CM

## 2011-05-27 DIAGNOSIS — I10 Essential (primary) hypertension: Secondary | ICD-10-CM

## 2011-05-27 DIAGNOSIS — D696 Thrombocytopenia, unspecified: Secondary | ICD-10-CM | POA: Diagnosis present

## 2011-05-27 DIAGNOSIS — M79643 Pain in unspecified hand: Secondary | ICD-10-CM

## 2011-05-27 DIAGNOSIS — Z79899 Other long term (current) drug therapy: Secondary | ICD-10-CM

## 2011-05-27 DIAGNOSIS — L408 Other psoriasis: Secondary | ICD-10-CM

## 2011-05-27 DIAGNOSIS — Z86718 Personal history of other venous thrombosis and embolism: Secondary | ICD-10-CM

## 2011-05-27 DIAGNOSIS — M65839 Other synovitis and tenosynovitis, unspecified forearm: Principal | ICD-10-CM | POA: Diagnosis present

## 2011-05-27 DIAGNOSIS — Z7901 Long term (current) use of anticoagulants: Secondary | ICD-10-CM

## 2011-05-27 DIAGNOSIS — E119 Type 2 diabetes mellitus without complications: Secondary | ICD-10-CM | POA: Diagnosis present

## 2011-05-27 DIAGNOSIS — F29 Unspecified psychosis not due to a substance or known physiological condition: Secondary | ICD-10-CM

## 2011-05-27 DIAGNOSIS — M109 Gout, unspecified: Secondary | ICD-10-CM | POA: Diagnosis present

## 2011-05-27 DIAGNOSIS — E785 Hyperlipidemia, unspecified: Secondary | ICD-10-CM | POA: Diagnosis present

## 2011-05-27 DIAGNOSIS — Z96649 Presence of unspecified artificial hip joint: Secondary | ICD-10-CM

## 2011-05-27 DIAGNOSIS — R21 Rash and other nonspecific skin eruption: Secondary | ICD-10-CM | POA: Diagnosis present

## 2011-05-27 DIAGNOSIS — E876 Hypokalemia: Secondary | ICD-10-CM | POA: Diagnosis present

## 2011-05-27 DIAGNOSIS — Z8673 Personal history of transient ischemic attack (TIA), and cerebral infarction without residual deficits: Secondary | ICD-10-CM

## 2011-05-27 DIAGNOSIS — M1990 Unspecified osteoarthritis, unspecified site: Secondary | ICD-10-CM | POA: Diagnosis present

## 2011-05-27 DIAGNOSIS — R4182 Altered mental status, unspecified: Secondary | ICD-10-CM

## 2011-05-27 HISTORY — DX: Cerebral infarction, unspecified: I63.9

## 2011-05-27 HISTORY — DX: Personal history of other (healed) physical injury and trauma: Z87.828

## 2011-05-27 LAB — DIFFERENTIAL
Basophils Absolute: 0 10*3/uL (ref 0.0–0.1)
Basophils Absolute: 0 10*3/uL (ref 0.0–0.1)
Basophils Relative: 0 % (ref 0–1)
Basophils Relative: 0 % (ref 0–1)
Eosinophils Absolute: 0.1 10*3/uL (ref 0.0–0.7)
Eosinophils Relative: 1 % (ref 0–5)
Lymphocytes Relative: 12 % (ref 12–46)
Lymphocytes Relative: 13 % (ref 12–46)
Lymphs Abs: 0.9 10*3/uL (ref 0.7–4.0)
Monocytes Absolute: 0.5 10*3/uL (ref 0.1–1.0)
Monocytes Absolute: 0.4 10*3/uL (ref 0.1–1.0)
Monocytes Relative: 7 % (ref 3–12)
Neutro Abs: 5.6 10*3/uL (ref 1.7–7.7)
Neutro Abs: 4.1 10*3/uL (ref 1.7–7.7)
Neutrophils Relative %: 79 % — ABNORMAL HIGH (ref 43–77)
Neutrophils Relative %: 79 % — ABNORMAL HIGH (ref 43–77)

## 2011-05-27 LAB — CBC
HCT: 44.5 % (ref 39.0–52.0)
HCT: 42.8 % (ref 39.0–52.0)
Hemoglobin: 15.3 g/dL (ref 13.0–17.0)
MCH: 32.6 pg (ref 26.0–34.0)
MCHC: 34.4 g/dL (ref 30.0–36.0)
MCHC: 34.6 g/dL (ref 30.0–36.0)
MCV: 94.7 fL (ref 78.0–100.0)
Platelets: 129 10*3/uL — ABNORMAL LOW (ref 150–400)
Platelets: 106 10*3/uL — ABNORMAL LOW (ref 150–400)
RBC: 4.7 MIL/uL (ref 4.22–5.81)
RDW: 13.7 % (ref 11.5–15.5)
RDW: 13.8 % (ref 11.5–15.5)
WBC: 7.1 10*3/uL (ref 4.0–10.5)
WBC: 5.2 10*3/uL (ref 4.0–10.5)

## 2011-05-27 LAB — PROTIME-INR
INR: 3.05 — ABNORMAL HIGH (ref 0.00–1.49)
Prothrombin Time: 32 seconds — ABNORMAL HIGH (ref 11.6–15.2)

## 2011-05-27 LAB — BASIC METABOLIC PANEL
BUN: 17 mg/dL (ref 6–23)
CO2: 26 mEq/L (ref 19–32)
Calcium: 9.4 mg/dL (ref 8.4–10.5)
Chloride: 101 mEq/L (ref 96–112)
Creatinine, Ser: 1.07 mg/dL (ref 0.50–1.35)
GFR calc Af Amer: 75 mL/min — ABNORMAL LOW (ref >90)
GFR calc non Af Amer: 65 mL/min — ABNORMAL LOW (ref >90)
Glucose, Bld: 99 mg/dL (ref 70–99)
Potassium: 3 mEq/L — ABNORMAL LOW (ref 3.5–5.1)
Sodium: 139 mEq/L (ref 135–145)

## 2011-05-27 LAB — MRSA PCR SCREENING: MRSA by PCR: POSITIVE — AB

## 2011-05-27 LAB — TYPE AND SCREEN
ABO/RH(D): A NEG
Antibody Screen: NEGATIVE

## 2011-05-27 LAB — LACTIC ACID, PLASMA: Lactic Acid, Venous: 1.3 mmol/L (ref 0.5–2.2)

## 2011-05-27 MED ORDER — POTASSIUM CHLORIDE 10 MEQ/100ML IV SOLN
10.0000 meq | Freq: Once | INTRAVENOUS | Status: AC
  Administered 2011-05-27: 10 meq via INTRAVENOUS
  Filled 2011-05-27: qty 100

## 2011-05-27 MED ORDER — CLINDAMYCIN PHOSPHATE 900 MG/50ML IV SOLN
900.0000 mg | Freq: Once | INTRAVENOUS | Status: AC
  Administered 2011-05-27: 900 mg via INTRAVENOUS
  Filled 2011-05-27: qty 50

## 2011-05-27 MED ORDER — ENALAPRIL MALEATE 5 MG PO TABS
5.0000 mg | ORAL_TABLET | Freq: Every morning | ORAL | Status: DC
  Administered 2011-05-28 – 2011-05-31 (×4): 5 mg via ORAL
  Filled 2011-05-27 (×4): qty 1

## 2011-05-27 MED ORDER — SODIUM CHLORIDE 0.9 % IV BOLUS (SEPSIS)
1000.0000 mL | Freq: Once | INTRAVENOUS | Status: AC
  Administered 2011-05-27: 1000 mL via INTRAVENOUS

## 2011-05-27 MED ORDER — VANCOMYCIN HCL 1000 MG IV SOLR
1500.0000 mg | INTRAVENOUS | Status: DC
  Administered 2011-05-27 – 2011-05-30 (×4): 1500 mg via INTRAVENOUS
  Filled 2011-05-27 (×5): qty 1500

## 2011-05-27 MED ORDER — VITAMIN K1 10 MG/ML IJ SOLN
5.0000 mg | Freq: Once | INTRAVENOUS | Status: AC
  Administered 2011-05-27: 5 mg via INTRAVENOUS
  Filled 2011-05-27: qty 0.5

## 2011-05-27 MED ORDER — OMEGA-3-ACID ETHYL ESTERS 1 G PO CAPS
1.0000 g | ORAL_CAPSULE | Freq: Every day | ORAL | Status: DC
  Administered 2011-05-28 – 2011-05-31 (×4): 1 g via ORAL
  Filled 2011-05-27 (×4): qty 1

## 2011-05-27 MED ORDER — ACETAMINOPHEN 325 MG PO TABS: 650.0000 mg | ORAL_TABLET | Freq: Four times a day (QID) | ORAL | Status: DC | PRN

## 2011-05-27 MED ORDER — CLINDAMYCIN PHOSPHATE 600 MG/50ML IV SOLN
600.0000 mg | Freq: Three times a day (TID) | INTRAVENOUS | Status: DC
  Administered 2011-05-27 – 2011-05-31 (×13): 600 mg via INTRAVENOUS
  Filled 2011-05-27 (×16): qty 50

## 2011-05-27 MED ORDER — ACETAMINOPHEN 650 MG RE SUPP: 650.0000 mg | Freq: Four times a day (QID) | RECTAL | Status: DC | PRN

## 2011-05-27 MED ORDER — ONDANSETRON HCL 4 MG PO TABS: 4.0000 mg | ORAL_TABLET | Freq: Four times a day (QID) | ORAL | Status: DC | PRN

## 2011-05-27 MED ORDER — METOPROLOL TARTRATE 50 MG PO TABS
50.0000 mg | ORAL_TABLET | Freq: Two times a day (BID) | ORAL | Status: DC
  Administered 2011-05-27 – 2011-05-31 (×8): 50 mg via ORAL
  Filled 2011-05-27 (×10): qty 1

## 2011-05-27 MED ORDER — ONDANSETRON HCL 4 MG/2ML IJ SOLN: 4.0000 mg | Freq: Four times a day (QID) | INTRAMUSCULAR | Status: DC | PRN

## 2011-05-27 MED ORDER — PIPERACILLIN-TAZOBACTAM 3.375 G IVPB
3.3750 g | Freq: Three times a day (TID) | INTRAVENOUS | Status: DC
  Administered 2011-05-27 – 2011-05-31 (×12): 3.375 g via INTRAVENOUS
  Filled 2011-05-27 (×14): qty 50

## 2011-05-27 MED ORDER — COLCHICINE 0.6 MG PO TABS
0.6000 mg | ORAL_TABLET | Freq: Two times a day (BID) | ORAL | Status: DC | PRN
  Administered 2011-05-27: 0.6 mg via ORAL
  Filled 2011-05-27 (×2): qty 1

## 2011-05-27 MED ORDER — AMLODIPINE BESYLATE 2.5 MG PO TABS
2.5000 mg | ORAL_TABLET | Freq: Every morning | ORAL | Status: DC
  Administered 2011-05-28 – 2011-05-31 (×4): 2.5 mg via ORAL
  Filled 2011-05-27 (×4): qty 1

## 2011-05-27 MED ORDER — SIMVASTATIN 20 MG PO TABS
20.0000 mg | ORAL_TABLET | Freq: Every day | ORAL | Status: DC
  Administered 2011-05-27 – 2011-05-30 (×4): 20 mg via ORAL
  Filled 2011-05-27 (×6): qty 1

## 2011-05-27 MED ORDER — ADULT MULTIVITAMIN W/MINERALS CH
1.0000 | ORAL_TABLET | Freq: Every morning | ORAL | Status: DC
  Administered 2011-05-28 – 2011-05-31 (×4): 1 via ORAL
  Filled 2011-05-27 (×4): qty 1

## 2011-05-27 MED ORDER — OMEGA-3 FATTY ACIDS 1000 MG PO CAPS: 1.0000 g | ORAL_CAPSULE | Freq: Every morning | ORAL | Status: DC

## 2011-05-27 MED ORDER — METHOCARBAMOL 500 MG PO TABS
500.0000 mg | ORAL_TABLET | Freq: Every day | ORAL | Status: DC | PRN
  Filled 2011-05-27: qty 1

## 2011-05-27 MED ORDER — POTASSIUM CHLORIDE IN NACL 20-0.9 MEQ/L-% IV SOLN
INTRAVENOUS | Status: DC
  Administered 2011-05-27 – 2011-05-31 (×6): via INTRAVENOUS
  Filled 2011-05-27 (×11): qty 1000

## 2011-05-27 MED ORDER — CLINDAMYCIN PHOSPHATE 900 MG/6ML IV SOLN: 900.0000 mg | Freq: Once | INTRAVENOUS | Status: DC

## 2011-05-27 MED ORDER — MORPHINE SULFATE 4 MG/ML IJ SOLN
6.0000 mg | Freq: Once | INTRAMUSCULAR | Status: AC
  Filled 2011-05-27: qty 1

## 2011-05-27 MED ORDER — PIPERACILLIN-TAZOBACTAM 3.375 G IVPB
3.3750 g | Freq: Once | INTRAVENOUS | Status: AC
  Administered 2011-05-27: 3.375 g via INTRAVENOUS
  Filled 2011-05-27: qty 50

## 2011-05-27 MED ORDER — HYDROMORPHONE HCL PF 1 MG/ML IJ SOLN: 0.5000 mg | INTRAMUSCULAR | Status: DC | PRN

## 2011-05-27 MED ORDER — ONDANSETRON HCL 4 MG/2ML IJ SOLN
4.0000 mg | Freq: Once | INTRAMUSCULAR | Status: AC
  Filled 2011-05-27: qty 2

## 2011-05-27 NOTE — Progress Notes (Signed)
ANTIBIOTIC CONSULT NOTE - INITIAL  Pharmacy Consult for Vancomycin / Zosyn Indication: hand cellulitis  Allergies  Allergen Reactions  . Tetanus Toxoids Swelling    Also allergic to "high powered pain medications."    Patient Measurements: Height: 5\' 7"  (170.2 cm) Weight: 216 lb 7.9 oz (98.2 kg) IBW/kg (Calculated) : 66.1   Labs:  Basename 05/27/11 1332  WBC 7.1  HGB 15.3  PLT 129*  LABCREA --  CREATININE 1.07   Estimated Creatinine Clearance: 64.5 ml/min (by C-G formula based on Cr of 1.07). No results found for this basename: VANCOTROUGH:2,VANCOPEAK:2,VANCORANDOM:2,GENTTROUGH:2,GENTPEAK:2,GENTRANDOM:2,TOBRATROUGH:2,TOBRAPEAK:2,TOBRARND:2,AMIKACINPEAK:2,AMIKACINTROU:2,AMIKACIN:2, in the last 72 hours   Microbiology: Recent Results (from the past 720 hour(s))  CULTURE, BLOOD (ROUTINE X 2)     Status: Normal (Preliminary result)   Collection Time   05/27/11  2:37 PM      Component Value Range Status Comment   Specimen Description Blood RIGHT ARM   Final    Special Requests BOTTLES DRAWN AEROBIC AND ANAEROBIC 6 CC EACH   Final    Culture PENDING   Incomplete    Report Status PENDING   Incomplete   CULTURE, BLOOD (ROUTINE X 2)     Status: Normal (Preliminary result)   Collection Time   05/27/11  2:49 PM      Component Value Range Status Comment   Specimen Description Blood RIGHT ARM   Final    Special Requests BOTTLES DRAWN AEROBIC AND ANAEROBIC 6 CC EACH   Final    Culture PENDING   Incomplete    Report Status PENDING   Incomplete     Medical History: Past Medical History  Diagnosis Date  . Hypertension   . Gout   . High cholesterol     Assessment: 78 year old admitted with decreased mental status.  Was being treated for hand infection with doxycycline prior to admission  CrCl stable  Goal of Therapy:  1) Appropriate Zosyn dosing 2) Vancomycin trough = 10 to 15 mcg / dl  Plan:  1) Zosyn 5.093 grams iv Q 8 hours - 4 hour infusion 2) Vancomycin 1500 mg IV Q  24 hours 3) Follow up plan, cultures, scr  Thank you.  Elwin Sleight 05/27/2011,8:50 PM

## 2011-05-27 NOTE — ED Notes (Signed)
Pt did not recognize people as usual, thought he was at home.  Pain rt arm,  Saturday out side working on [place] and thought that something bit him.  Seen by MD on Monday.  Moaning at times.

## 2011-05-27 NOTE — Progress Notes (Signed)
Pt admitted [id number] as a transfer from [facility] ED. Pt was bitten by something on Saturday, has had pain, redness and swelling to R hand since. R hand has very limited movement, pain with light touch. AOx4, skin intact except for presenting problem. Brother at bedside.  Idalia Needle RN

## 2011-05-27 NOTE — ED Notes (Signed)
Pt to xray

## 2011-05-27 NOTE — H&P (Signed)
[name] is an 78 y.o. male.   PCP - Dr.William McGough. Chief Complaint: Right hand pain. HPI: 78 year-old male with history of hypertension, hyperlipidemia and TIA is on Coumadin for DVT after a left hip surgery in November 2012 has been experiencing increasing pain in his right hand since Saturday, that is 5 days ago. Patient states he was working in his yard at his home with some rose flowers when he suddenly got bit by something on his right hand. He does not recall what bit him. Since then the pain and swelling in his right hand has been increasing. He went to his PCP a day later on Monday and was prescribed doxycycline. But despite taking which the hand pain has progressively worsened. In addition his family also noticed some mental status changes where patient was found to be a little confused and weak. He was brought to the ER at [facility]. X-rays reveal soft tissue swelling of his right hand. Patient has been transferred to [facility] for further management due to the need of hand surgeon evaluation. Patient was given clindamycin at [facility] and also was given vitamin K for reversal his INR which was at around 3. In addition patient has an increasing pain of his left ankle which he states is typical of his gout. Also noticed was rash on his left anterior thigh which is very localized and patient states it was noticed when he came to the ER and before he received any medications. The rash is non-itching and not painful and not blanching. He does not have any difficulty swallowing, any difficulty breathing, chest pain or cough. Denies fever chills. On my exam patient is completely oriented to time place and person and is answering questions appropriately.  Past Medical History  Diagnosis Date  . Hypertension   . Gout   . High cholesterol     Past Surgical History  Procedure Date  . Hip arthroplasty 12/08/2010    Procedure: ARTHROPLASTY BIPOLAR HIP;  Surgeon:  [name];  Location: [facility] OR;  Service: Orthopedics;  Laterality: Left;    History reviewed. No pertinent family history. Social History:  reports that he has quit smoking. He uses smokeless tobacco. He reports that he does not drink alcohol or use illicit drugs.  Allergies:  Allergies  Allergen Reactions  . Tetanus Toxoids Swelling    Also allergic to "high powered pain medications."    Medications Prior to Admission  Medication Sig Dispense Refill  . amLODipine (NORVASC) 5 MG tablet Take 2.5 mg by mouth every morning.       Marland Kitchen ascorbic acid (VITAMIN C) 500 MG tablet Take 500 mg by mouth every morning.       . calcium-vitamin D (OSCAL WITH D) 500-200 MG-UNIT per tablet Take 1 tablet by mouth every morning.       . colchicine 0.6 MG tablet Take 0.6 mg by mouth 2 (two) times daily as needed. For gout      . doxycycline (VIBRA-TABS) 100 MG tablet Take 100 mg by mouth 2 (two) times daily. 14 day course      . enalapril (VASOTEC) 5 MG tablet Take 5 mg by mouth every morning.       . fish oil-omega-3 fatty acids 1000 MG capsule Take 1 g by mouth every morning.       . methocarbamol (ROBAXIN) 500 MG tablet Take 500 mg by mouth daily as needed. For muscle spasms      .  metoprolol (LOPRESSOR) 50 MG tablet Take 50 mg by mouth 2 (two) times daily.        . Multiple Vitamin (MULITIVITAMIN WITH MINERALS) TABS Take 1 tablet by mouth every morning.       . simvastatin (ZOCOR) 20 MG tablet Take 20 mg by mouth at bedtime.       . vitamin E 400 UNIT capsule Take 400 Units by mouth every morning.       . warfarin (COUMADIN) 1 MG tablet Take 4 mg by mouth daily. Take 4 tablets by mouth once daily        Results for orders placed during the hospital encounter of 05/27/11 (from the past 48 hour(s))  CBC     Status: Abnormal   Collection Time   05/27/11  1:32 PM      Component Value Range Comment   WBC 7.1  4.0 - 10.5 (K/uL)    RBC 4.70  4.22 - 5.81 (MIL/uL)    Hemoglobin 15.3  13.0 - 17.0 (g/dL)     HCT 16.1  09.6 - 04.5 (%)    MCV 94.7  78.0 - 100.0 (fL)    MCH 32.6  26.0 - 34.0 (pg)    MCHC 34.4  30.0 - 36.0 (g/dL)    RDW 40.9  81.1 - 91.4 (%)    Platelets 129 (*) 150 - 400 (K/uL)   DIFFERENTIAL     Status: Abnormal   Collection Time   05/27/11  1:32 PM      Component Value Range Comment   Neutrophils Relative 79 (*) 43 - 77 (%)    Neutro Abs 5.6  1.7 - 7.7 (K/uL)    Lymphocytes Relative 12  12 - 46 (%)    Lymphs Abs 0.9  0.7 - 4.0 (K/uL)    Monocytes Relative 7  3 - 12 (%)    Monocytes Absolute 0.5  0.1 - 1.0 (K/uL)    Eosinophils Relative 1  0 - 5 (%)    Eosinophils Absolute 0.1  0.0 - 0.7 (K/uL)    Basophils Relative 0  0 - 1 (%)    Basophils Absolute 0.0  0.0 - 0.1 (K/uL)   PROTIME-INR     Status: Abnormal   Collection Time   05/27/11  1:32 PM      Component Value Range Comment   Prothrombin Time 32.0 (*) 11.6 - 15.2 (seconds)    INR 3.05 (*) 0.00 - 1.49    TYPE AND SCREEN     Status: Normal   Collection Time   05/27/11  1:32 PM      Component Value Range Comment   ABO/RH(D) A NEG      Antibody Screen NEG      Sample Expiration 05/30/2011     BASIC METABOLIC PANEL     Status: Abnormal   Collection Time   05/27/11  1:32 PM      Component Value Range Comment   Sodium 139  135 - 145 (mEq/L)    Potassium 3.0 (*) 3.5 - 5.1 (mEq/L)    Chloride 101  96 - 112 (mEq/L)    CO2 26  19 - 32 (mEq/L)    Glucose, Bld 99  70 - 99 (mg/dL)    BUN 17  6 - 23 (mg/dL)    Creatinine, Ser 7.82  0.50 - 1.35 (mg/dL)    Calcium 9.4  8.4 - 10.5 (mg/dL)    GFR calc non Af Amer 65 (*) >90 (mL/min)  GFR calc Af Amer 75 (*) >90 (mL/min)   LACTIC ACID, PLASMA     Status: Normal   Collection Time   05/27/11  2:36 PM      Component Value Range Comment   Lactic Acid, Venous 1.3  0.5 - 2.2 (mmol/L)   CULTURE, BLOOD (ROUTINE X 2)     Status: Normal (Preliminary result)   Collection Time   05/27/11  2:37 PM      Component Value Range Comment   Specimen Description Blood RIGHT ARM      Special  Requests BOTTLES DRAWN AEROBIC AND ANAEROBIC 6 CC EACH      Culture PENDING      Report Status PENDING     CULTURE, BLOOD (ROUTINE X 2)     Status: Normal (Preliminary result)   Collection Time   05/27/11  2:49 PM      Component Value Range Comment   Specimen Description Blood RIGHT ARM      Special Requests BOTTLES DRAWN AEROBIC AND ANAEROBIC 6 CC EACH      Culture PENDING      Report Status PENDING      Dg Chest 2 View  05/27/2011  *RADIOLOGY REPORT*  Clinical Data: Preop radiograph.  CHEST - 2 VIEW  Comparison: None  Findings: Heart size is normal.  No pleural effusion or edema.  There is no airspace consolidation identified.  Scar -like opacity is noted in the left base.  IMPRESSION:  1.  No acute cardiopulmonary abnormalities.  Original Report Authenticated By: Rosealee Albee, M.D.   Dg Wrist Complete Right  05/27/2011  *RADIOLOGY REPORT*  Clinical Data: Infection, redness and swelling.  RIGHT WRIST - COMPLETE 3+ VIEW  Comparison: 05/27/2011  Findings: There is no evidence of fracture or dislocation.  There is no evidence of arthropathy or other focal bone abnormality. The soft tissues appear edematous.  IMPRESSION:  1.  Soft tissue swelling.  Original Report Authenticated By: Rosealee Albee, M.D.   Dg Hand Complete Right  05/27/2011  *RADIOLOGY REPORT*  Clinical Data: Infected bug bite  RIGHT HAND - COMPLETE 3+ VIEW  Comparison: None.  Findings: nonspecific erosion involving the head of the fifth metatarsal.  No destructive bone lesion.  No acute fracture and no dislocation. Mild degenerative changes.  IMPRESSION: No acute bony pathology.  Original Report Authenticated By: Donavan Burnet, M.D.    Review of Systems  HENT: Negative.   Eyes: Negative.   Respiratory: Negative.   Cardiovascular: Negative.   Gastrointestinal: Negative.   Genitourinary: Negative.   Musculoskeletal:       Right hand pain.  Skin: Negative.   Neurological: Positive for weakness.  Psychiatric/Behavioral:  Negative.     Blood pressure 142/93, pulse 92, temperature 98 F (36.7 C), temperature source Oral, resp. rate 20, height 5\' 7"  (1.702 m), weight 98.2 kg (216 lb 7.9 oz), SpO2 95.00%. Physical Exam  Constitutional: He is oriented to person, place, and time. He appears well-developed and well-nourished. No distress.  HENT:  Head: Normocephalic and atraumatic.  Right Ear: External ear normal.  Left Ear: External ear normal.  Mouth/Throat: No oropharyngeal exudate.  Eyes: Conjunctivae are normal. Pupils are equal, round, and reactive to light. Right eye exhibits no discharge. Left eye exhibits no discharge. No scleral icterus.  Neck: Normal range of motion. Neck supple.  Cardiovascular: Normal rate and regular rhythm.   Respiratory: Effort normal and breath sounds normal. No respiratory distress. He has no wheezes. He has no rales.  GI: Soft. Bowel sounds are normal. He exhibits no distension. There is no tenderness. There is no rebound.  Musculoskeletal:       Right hand is swollen and in flexed position. Tenderness present upto wrist. When patient makes any movement of his right he has intense pain. Also has rash on the the anterior aspect of his left thigh which is localized and non blanching. Complains of left ankle pain though no obvious selling in the left ankle.  Neurological: He is alert and oriented to person, place, and time.       Moves all extremities.  Skin: Rash (Rash on the anterior aspect of his right thigh.) noted. He is not diaphoretic.     Assessment/Plan #1. Right hand cellulitis - at this time ER physician as notified Dr. Melvyn Novas of hand surgery. For now we will keep patient on empiric antibiotics including vancomycin, Zosyn and clindamycin. Though patient's chart states patient is allergic to tetanus toxoid patient states he did receive one last November after the hip surgery. We'll keep patient n.p.o. after midnight in anticipation of possible procedure if needed by a  hand surgeon. Keep patient on pain relief medications. Patient did have blood cultures done at the ER which needs to be followed. #2. Rash on the anterior left thigh - unclear at this time. Keep a close watch on it. #3. Mild thrombocytopenia - reviewing his chart he does have chronic thrombocytopenia. Closely CBC. #4. History of gout - patient is on when necessary colchicine which will continue for now. He does complain of pain in the left ankle keep a close watch on it. #5. History of DVT on Coumadin and coagulopathy secondary to Coumadin - patient had received vitamin K in the ER. Recheck INR in a.m. For now we'll hold Coumadin. Patient will be on SCDs for DVT prophylaxis. #6. History of hypertension and hyperlipidemia - continue present medications.  CODE STATUS - full code.  Eduard Clos 05/27/2011, 8:36 PM

## 2011-05-27 NOTE — ED Provider Notes (Addendum)
History    78 year old male brought in for evaluation for decreased mental status. Patient seemed very tired and today did not recognize people that are known to him. Patient is currently being treated for infection of his right hand  With doxycycline. Patient states that on Saturday he is outside doing yardwork on rose bushes and thinks he may have been bitten although he did not specifically see an insect. Mild discomfort initially in the area between his third and fourth knuckles. Worsening over the next day. Was evaluated by his PCP on Monday and started on doxycycline which she has been taking since. Pain has been progressively worsening stress of swelling. No fevers or chills. Patient states that he just feels generally tired. On coumadin.  CSN: [id number]  Arrival date & time 05/27/11  1155   First MD Initiated Contact with Patient 05/27/11 1252      No chief complaint on file.   (Consider location/radiation/quality/duration/timing/severity/associated sxs/prior treatment) HPI  Past Medical History  Diagnosis Date  . Hypertension   . Gout   . High cholesterol     Past Surgical History  Procedure Date  . Hip arthroplasty 12/08/2010    Procedure: ARTHROPLASTY BIPOLAR HIP;  Surgeon: [name];  Location: [facility] OR;  Service: Orthopedics;  Laterality: Left;    History reviewed. No pertinent family history.  History  Substance Use Topics  . Smoking status: Former Games developer  . Smokeless tobacco: Current User  . Alcohol Use: No      Review of Systems   Review of symptoms negative unless otherwise noted in HPI.   Allergies  Tetanus toxoids  Home Medications   Current Outpatient Rx  Name Route Sig Dispense Refill  . AMLODIPINE BESYLATE 5 MG PO TABS Oral Take 2.5 mg by mouth daily.     . ASCORBIC ACID 500 MG PO TABS Oral Take 500 mg by mouth daily.    Marland Kitchen CALCIUM CARBONATE-VITAMIN D 500-200 MG-UNIT PO TABS Oral Take 1 tablet by mouth daily.    . COLCHICINE 0.6 MG PO TABS  Oral Take 0.6 mg by mouth 2 (two) times daily as needed. For gout    . DOXYCYCLINE HYCLATE 100 MG PO TABS Oral Take 100 mg by mouth 2 (two) times daily. 14 day course    . ENALAPRIL MALEATE 5 MG PO TABS Oral Take 5 mg by mouth daily.    . OMEGA-3 FATTY ACIDS 1000 MG PO CAPS Oral Take 1 g by mouth daily.      Marland Kitchen METHOCARBAMOL 500 MG PO TABS Oral Take 500 mg by mouth daily as needed. For muscle spasms    . METOPROLOL TARTRATE 50 MG PO TABS Oral Take 50 mg by mouth 2 (two) times daily.      . ADULT MULTIVITAMIN W/MINERALS CH Oral Take 1 tablet by mouth daily.      Marland Kitchen SIMVASTATIN 20 MG PO TABS Oral Take 20 mg by mouth daily.      Marland Kitchen VITAMIN E 400 UNITS PO CAPS Oral Take 400 Units by mouth daily.    . WARFARIN SODIUM 1 MG PO TABS Oral Take 4 mg by mouth daily. Take 4 tablets by mouth once daily      BP 147/94  Pulse 89  Temp(Src) 98.1 F (36.7 C) (Oral)  Wt 225 lb (102.059 kg)  SpO2 97%  Physical Exam  Nursing note and vitals reviewed. Constitutional: He appears well-developed and well-nourished.       Tired appearing, but not toxic.  HENT:  Head: Normocephalic and atraumatic.  Eyes: Conjunctivae are normal. Right eye exhibits no discharge. Left eye exhibits no discharge.  Neck: Neck supple.  Cardiovascular: Normal rate, regular rhythm and normal heart sounds.  Exam reveals no gallop and no friction rub.   No murmur heard. Pulmonary/Chest: Effort normal and breath sounds normal. No respiratory distress.  Abdominal: Soft. He exhibits no distension. There is no tenderness.  Musculoskeletal:       Diffuse swelling of R hand and distal third wrist. Erythema over dorsum of hand with increased warmth. Exquisite ain with extension and flexion of 2nd-5th fingers and at wrist. Small amount of dried blood web space between 3rd/4th and 4th/5th fingers although source not identified. No breaks in skin noted.  Neurological: He is alert.  Skin: Skin is warm and dry.  Psychiatric: He has a normal mood  and affect. His behavior is normal. Thought content normal.    ED Course  Procedures (including critical care time)  Labs Reviewed  CBC - Abnormal; Notable for the following:    Platelets 129 (*)    All other components within normal limits  DIFFERENTIAL - Abnormal; Notable for the following:    Neutrophils Relative 79 (*)    All other components within normal limits  PROTIME-INR - Abnormal; Notable for the following:    Prothrombin Time 32.0 (*)    INR 3.05 (*)    All other components within normal limits  BASIC METABOLIC PANEL - Abnormal; Notable for the following:    Potassium 3.0 (*)    GFR calc non Af Amer 65 (*)    GFR calc Af Amer 75 (*)    All other components within normal limits  LACTIC ACID, PLASMA  CULTURE, BLOOD (ROUTINE X 2)  CULTURE, BLOOD (ROUTINE X 2)  TYPE AND SCREEN   Dg Chest 2 View  05/27/2011  *RADIOLOGY REPORT*  Clinical Data: Preop radiograph.  CHEST - 2 VIEW  Comparison: None  Findings: Heart size is normal.  No pleural effusion or edema.  There is no airspace consolidation identified.  Scar -like opacity is noted in the left base.  IMPRESSION:  1.  No acute cardiopulmonary abnormalities.  Original Report Authenticated By: Rosealee Albee, M.D.   Dg Wrist Complete Right  05/27/2011  *RADIOLOGY REPORT*  Clinical Data: Infection, redness and swelling.  RIGHT WRIST - COMPLETE 3+ VIEW  Comparison: 05/27/2011  Findings: There is no evidence of fracture or dislocation.  There is no evidence of arthropathy or other focal bone abnormality. The soft tissues appear edematous.  IMPRESSION:  1.  Soft tissue swelling.  Original Report Authenticated By: Rosealee Albee, M.D.   Dg Hand Complete Right  05/27/2011  *RADIOLOGY REPORT*  Clinical Data: Infected bug bite  RIGHT HAND - COMPLETE 3+ VIEW  Comparison: None.  Findings: nonspecific erosion involving the head of the fifth metatarsal.  No destructive bone lesion.  No acute fracture and no dislocation. Mild degenerative  changes.  IMPRESSION: No acute bony pathology.  Original Report Authenticated By: Donavan Burnet, M.D.   EKG:  Rhythm: Normal sinus rhythm  Rate: 91 Axis: Left axis deviation Intervals: Right bundle branch block. Left anterior fascicular block. ST segments: Nonspecific ST changes. There is T-wave flattening inferiorly.  Comparison: Stable from prior EKG from 01/11/2011   1. Hand pain   2. Mental status, decreased       MDM  78 year old male exam concerning for infection of his right hand and wrist. Consider other inflammatory arthropathy such  as crystal induced, particularly with hx of gout. Decreased mental status concerning for possible infectious etiology. Antibiotics including Zosyn and also clindamycin for additional coverage particularly since if infectious, likely inocculated while doing yard work. Laboratory studies including a lactate and blood cultures. INR because of Coumadin use. Vitamin K given empirically to start reversal because suspect the patient may need to go the operating room. XR to eval for subcutaneous air. Pain meds and IVF. Will discuss with hand. Likely transfer to [facility].   Raeford Razor, MD 05/27/11 1344  Raeford Razor, MD 05/27/11 1640

## 2011-05-27 NOTE — ED Notes (Signed)
[name] (brother) [phone]

## 2011-05-27 NOTE — ED Notes (Signed)
Family at bedside. Patient does not need anything at this time. 

## 2011-05-28 ENCOUNTER — Encounter (HOSPITAL_COMMUNITY)
Admission: EM | Disposition: A | Discharge status: Home / Self Care | Payer: Self-pay | Source: Home / Self Care | Attending: Internal Medicine

## 2011-05-28 ENCOUNTER — Encounter (HOSPITAL_COMMUNITY): Payer: Self-pay | Admitting: Anesthesiology

## 2011-05-28 ENCOUNTER — Inpatient Hospital Stay (HOSPITAL_COMMUNITY): Payer: Medicare Other | Admitting: Anesthesiology

## 2011-05-28 DIAGNOSIS — D684 Acquired coagulation factor deficiency: Secondary | ICD-10-CM

## 2011-05-28 DIAGNOSIS — L02519 Cutaneous abscess of unspecified hand: Secondary | ICD-10-CM

## 2011-05-28 DIAGNOSIS — F29 Unspecified psychosis not due to a substance or known physiological condition: Secondary | ICD-10-CM

## 2011-05-28 DIAGNOSIS — L03119 Cellulitis of unspecified part of limb: Secondary | ICD-10-CM

## 2011-05-28 DIAGNOSIS — I10 Essential (primary) hypertension: Secondary | ICD-10-CM

## 2011-05-28 HISTORY — PX: I & D EXTREMITY: SHX5045

## 2011-05-28 LAB — COMPREHENSIVE METABOLIC PANEL
Alkaline Phosphatase: 66 U/L (ref 39–117)
BUN: 14 mg/dL (ref 6–23)
CO2: 23 mEq/L (ref 19–32)
GFR calc Af Amer: 90 mL/min — ABNORMAL LOW (ref >90)
GFR calc non Af Amer: 77 mL/min — ABNORMAL LOW (ref >90)
Glucose, Bld: 146 mg/dL — ABNORMAL HIGH (ref 70–99)
Potassium: 3.3 mEq/L — ABNORMAL LOW (ref 3.5–5.1)
Total Protein: 4.7 g/dL — ABNORMAL LOW (ref 6.0–8.3)

## 2011-05-28 LAB — PROTIME-INR
INR: 1.5 — ABNORMAL HIGH (ref 0.00–1.49)
Prothrombin Time: 18.4 seconds — ABNORMAL HIGH (ref 11.6–15.2)

## 2011-05-28 SURGERY — IRRIGATION AND DEBRIDEMENT EXTREMITY
Anesthesia: General | Site: Hand | Laterality: Right | Wound class: Dirty or Infected

## 2011-05-28 MED ORDER — FENTANYL CITRATE 0.05 MG/ML IJ SOLN
INTRAMUSCULAR | Status: DC | PRN
  Administered 2011-05-28 (×2): 50 ug via INTRAVENOUS

## 2011-05-28 MED ORDER — MORPHINE SULFATE 4 MG/ML IJ SOLN: 0.0500 mg/kg | INTRAMUSCULAR | Status: DC | PRN

## 2011-05-28 MED ORDER — EPHEDRINE SULFATE 50 MG/ML IJ SOLN
INTRAMUSCULAR | Status: DC | PRN
  Administered 2011-05-28: 10 mg via INTRAVENOUS

## 2011-05-28 MED ORDER — LACTATED RINGERS IV SOLN
INTRAVENOUS | Status: DC | PRN
  Administered 2011-05-28: 15:00:00 via INTRAVENOUS

## 2011-05-28 MED ORDER — CHLORHEXIDINE GLUCONATE 4 % EX LIQD
60.0000 mL | Freq: Once | CUTANEOUS | Status: AC
  Administered 2011-05-28: 4 via TOPICAL
  Filled 2011-05-28: qty 60

## 2011-05-28 MED ORDER — ONDANSETRON HCL 4 MG/2ML IJ SOLN: 4.0000 mg | Freq: Once | INTRAMUSCULAR | Status: DC | PRN

## 2011-05-28 MED ORDER — POTASSIUM CHLORIDE CRYS ER 20 MEQ PO TBCR
40.0000 meq | EXTENDED_RELEASE_TABLET | Freq: Four times a day (QID) | ORAL | Status: AC
  Administered 2011-05-28: 40 meq via ORAL
  Filled 2011-05-28: qty 2

## 2011-05-28 MED ORDER — HYDROMORPHONE HCL PF 1 MG/ML IJ SOLN
0.2500 mg | INTRAMUSCULAR | Status: DC | PRN
  Administered 2011-05-28: 0.5 mg via INTRAVENOUS

## 2011-05-28 MED ORDER — LIDOCAINE HCL 1 % IJ SOLN
INTRAMUSCULAR | Status: DC | PRN
  Administered 2011-05-28: 80 mg via INTRADERMAL

## 2011-05-28 MED ORDER — MUPIROCIN 2 % EX OINT
1.0000 "application " | TOPICAL_OINTMENT | Freq: Two times a day (BID) | CUTANEOUS | Status: DC
  Administered 2011-05-28 – 2011-05-31 (×7): 1 via NASAL
  Filled 2011-05-28 (×2): qty 22

## 2011-05-28 MED ORDER — ONDANSETRON HCL 4 MG/2ML IJ SOLN
INTRAMUSCULAR | Status: DC | PRN
  Administered 2011-05-28: 4 mg via INTRAVENOUS

## 2011-05-28 MED ORDER — PROPOFOL 10 MG/ML IV EMUL
INTRAVENOUS | Status: DC | PRN
  Administered 2011-05-28: 150 mg via INTRAVENOUS

## 2011-05-28 MED ORDER — SODIUM CHLORIDE 0.9 % IR SOLN
Status: DC | PRN
  Administered 2011-05-28: 3000 mL
  Administered 2011-05-28: 1000 mL

## 2011-05-28 MED ORDER — CHLORHEXIDINE GLUCONATE CLOTH 2 % EX PADS
6.0000 | MEDICATED_PAD | Freq: Every day | CUTANEOUS | Status: DC
  Administered 2011-05-28 – 2011-05-31 (×4): 6 via TOPICAL

## 2011-05-28 MED ORDER — COLCHICINE 0.6 MG PO TABS
0.6000 mg | ORAL_TABLET | Freq: Every day | ORAL | Status: DC
  Administered 2011-05-28 – 2011-05-31 (×4): 0.6 mg via ORAL
  Filled 2011-05-28 (×4): qty 1

## 2011-05-28 SURGICAL SUPPLY — 58 items
BANDAGE CONFORM 2  STR LF (GAUZE/BANDAGES/DRESSINGS) IMPLANT
BANDAGE ELASTIC 3 VELCRO ST LF (GAUZE/BANDAGES/DRESSINGS) ×2 IMPLANT
BANDAGE ELASTIC 4 VELCRO ST LF (GAUZE/BANDAGES/DRESSINGS) ×2 IMPLANT
BANDAGE GAUZE ELAST BULKY 4 IN (GAUZE/BANDAGES/DRESSINGS) ×2 IMPLANT
BNDG CMPR 9X4 STRL LF SNTH (GAUZE/BANDAGES/DRESSINGS) ×1
BNDG COHESIVE 1X5 TAN STRL LF (GAUZE/BANDAGES/DRESSINGS) IMPLANT
BNDG ESMARK 4X9 LF (GAUZE/BANDAGES/DRESSINGS) ×2 IMPLANT
CLOTH BEACON ORANGE TIMEOUT ST (SAFETY) ×2 IMPLANT
CONT SPECI 4OZ STER CLIK (MISCELLANEOUS) ×1 IMPLANT
CORDS BIPOLAR (ELECTRODE) ×2 IMPLANT
COVER SURGICAL LIGHT HANDLE (MISCELLANEOUS) ×2 IMPLANT
CUFF TOURNIQUET SINGLE 18IN (TOURNIQUET CUFF) ×2 IMPLANT
CUFF TOURNIQUET SINGLE 24IN (TOURNIQUET CUFF) IMPLANT
DRAIN PENROSE 1/4X12 LTX STRL (WOUND CARE) IMPLANT
DRAPE SURG 17X23 STRL (DRAPES) ×2 IMPLANT
DRSG ADAPTIC 3X8 NADH LF (GAUZE/BANDAGES/DRESSINGS) ×1 IMPLANT
ELECT REM PT RETURN 9FT ADLT (ELECTROSURGICAL) ×2
ELECTRODE REM PT RTRN 9FT ADLT (ELECTROSURGICAL) IMPLANT
GAUZE XEROFORM 1X8 LF (GAUZE/BANDAGES/DRESSINGS) ×2 IMPLANT
GAUZE XEROFORM 5X9 LF (GAUZE/BANDAGES/DRESSINGS) IMPLANT
GLOVE BIOGEL PI IND STRL 8.5 (GLOVE) ×1 IMPLANT
GLOVE BIOGEL PI INDICATOR 8.5 (GLOVE) ×1
GLOVE EXAM NITRILE MD LF STRL (GLOVE) ×1 IMPLANT
GLOVE SURG ORTHO 8.0 STRL STRW (GLOVE) ×2 IMPLANT
GLOVE SURG SS PI 7.0 STRL IVOR (GLOVE) ×2 IMPLANT
GOWN PREVENTION PLUS XLARGE (GOWN DISPOSABLE) ×2 IMPLANT
GOWN STRL NON-REIN LRG LVL3 (GOWN DISPOSABLE) ×5 IMPLANT
HANDPIECE INTERPULSE COAX TIP (DISPOSABLE)
KIT BASIN OR (CUSTOM PROCEDURE TRAY) ×2 IMPLANT
KIT ROOM TURNOVER OR (KITS) ×2 IMPLANT
MANIFOLD NEPTUNE II (INSTRUMENTS) ×2 IMPLANT
NDL HYPO 25GX1X1/2 BEV (NEEDLE) IMPLANT
NEEDLE HYPO 25GX1X1/2 BEV (NEEDLE) IMPLANT
NS IRRIG 1000ML POUR BTL (IV SOLUTION) ×2 IMPLANT
PACK ORTHO EXTREMITY (CUSTOM PROCEDURE TRAY) ×2 IMPLANT
PAD ARMBOARD 7.5X6 YLW CONV (MISCELLANEOUS) ×4 IMPLANT
PAD CAST 4YDX4 CTTN HI CHSV (CAST SUPPLIES) ×1 IMPLANT
PADDING CAST COTTON 4X4 STRL (CAST SUPPLIES) ×2
SET CYSTO W/LG BORE CLAMP LF (SET/KITS/TRAYS/PACK) ×1 IMPLANT
SET HNDPC FAN SPRY TIP SCT (DISPOSABLE) IMPLANT
SOAP 2 % CHG 4 OZ (WOUND CARE) ×2 IMPLANT
SPONGE GAUZE 4X4 12PLY (GAUZE/BANDAGES/DRESSINGS) ×3 IMPLANT
SPONGE LAP 18X18 X RAY DECT (DISPOSABLE) ×1 IMPLANT
SPONGE LAP 4X18 X RAY DECT (DISPOSABLE) ×2 IMPLANT
SUCTION FRAZIER TIP 10 FR DISP (SUCTIONS) ×2 IMPLANT
SUT ETHILON 4 0 PS 2 18 (SUTURE) IMPLANT
SUT ETHILON 5 0 P 3 18 (SUTURE)
SUT NYLON ETHILON 5-0 P-3 1X18 (SUTURE) ×1 IMPLANT
SUT PROLENE 3 0 PS 2 (SUTURE) ×2 IMPLANT
SWAB COLLECTION DEVICE MRSA (MISCELLANEOUS) ×1 IMPLANT
SYR CONTROL 10ML LL (SYRINGE) IMPLANT
TOWEL OR 17X24 6PK STRL BLUE (TOWEL DISPOSABLE) ×2 IMPLANT
TOWEL OR 17X26 10 PK STRL BLUE (TOWEL DISPOSABLE) ×2 IMPLANT
TUBE ANAEROBIC SPECIMEN COL (MISCELLANEOUS) ×1 IMPLANT
TUBE CONNECTING 12X1/4 (SUCTIONS) ×2 IMPLANT
UNDERPAD 30X30 INCONTINENT (UNDERPADS AND DIAPERS) ×2 IMPLANT
WATER STERILE IRR 1000ML POUR (IV SOLUTION) ×1 IMPLANT
YANKAUER SUCT BULB TIP NO VENT (SUCTIONS) ×2 IMPLANT

## 2011-05-28 NOTE — Preoperative (Signed)
Beta Blockers   Reason not to administer Beta Blockers:Not Applicable 

## 2011-05-28 NOTE — Progress Notes (Signed)
Dr. Ivin Booty called [id number] BP but second check of BP within normal range.  Monitoring closely

## 2011-05-28 NOTE — Anesthesia Procedure Notes (Signed)
Procedure Name: LMA Insertion Date/Time: 05/28/2011 4:15 PM Performed by: Leona Singleton A Pre-anesthesia Checklist: Patient identified Patient Re-evaluated:Patient Re-evaluated prior to inductionOxygen Delivery Method: Circle system utilized Preoxygenation: Pre-oxygenation with 100% oxygen Intubation Type: IV induction LMA: LMA inserted LMA Size: 4.0 Tube type: Oral Number of attempts: 1 Placement Confirmation: positive ETCO2 and breath sounds checked- equal and bilateral Tube secured with: Tape Dental Injury: Teeth and Oropharynx as per pre-operative assessment

## 2011-05-28 NOTE — Anesthesia Postprocedure Evaluation (Signed)
  Anesthesia Post-op Note  Patient: [name]  Procedure(s) Performed: Procedure(s) (LRB): IRRIGATION AND DEBRIDEMENT EXTREMITY (Right)  Patient Location: PACU  Anesthesia Type: General  Level of Consciousness: awake, alert  and oriented  Airway and Oxygen Therapy: Patient Spontanous Breathing and Patient connected to nasal cannula oxygen  Post-op Pain: mild  Post-op Assessment: Post-op Vital signs reviewed  Post-op Vital Signs: Reviewed  Complications: No apparent anesthesia complications

## 2011-05-28 NOTE — Progress Notes (Signed)
INITIAL ADULT NUTRITION ASSESSMENT Date: 05/28/2011   Time: 12:13 PM Reason for Assessment: Nutrition risk  ASSESSMENT: Male 78 y.o.  Dx: Cellulitis of hand  Hx: . Past Medical History  Diagnosis Date  . Hypertension   . Gout   . High cholesterol   . DVT (deep venous thrombosis), left 11/2010    LLE; S/P hip OR  . Borderline diabetes   . Stroke 08/2009; 07/2010  . H/O heat stroke 08/2009    /E-chart  . Transient ischemic attack (TIA) 07/2010    /E-chart  . Arthritis     Related Meds:     . amLODipine  2.5 mg Oral q morning - 10a  . chlorhexidine  60 mL Topical Once  . Chlorhexidine Gluconate Cloth  6 each Topical Q0600  . clindamycin (CLEOCIN) IV  600 mg Intravenous Q8H  . clindamycin (CLEOCIN) IV  900 mg Intravenous Once  . enalapril  5 mg Oral q morning - 10a  . metoprolol  50 mg Oral BID  . morphine  6 mg Intravenous Once  . mulitivitamin with minerals  1 tablet Oral q morning - 10a  . mupirocin ointment  1 application Nasal BID  . omega-3 acid ethyl esters  1 g Oral Daily  . ondansetron  4 mg Intravenous Once  . phytonadione (VITAMIN K) IV  5 mg Intravenous Once  . piperacillin-tazobactam  3.375 g Intravenous Once  . piperacillin-tazobactam (ZOSYN)  IV  3.375 g Intravenous Q8H  . potassium chloride  10 mEq Intravenous Once  . simvastatin  20 mg Oral QHS  . sodium chloride  1,000 mL Intravenous Once  . vancomycin  1,500 mg Intravenous Q24H  . DISCONTD: clindamycin (CLEOCIN) 900 mg IVPB (ADD-Vant)  900 mg Intravenous Once  . DISCONTD: fish oil-omega-3 fatty acids  1 g Oral q morning - 10a     Ht: 5\' 7"  (170.2 cm)  Wt: 216 lb 7.9 oz (98.2 kg)  Ideal Wt: 67.3 kg % Ideal Wt: 146%  Usual Wt:  Wt Readings from Last 10 Encounters:  05/27/11 216 lb 7.9 oz (98.2 kg)  05/27/11 216 lb 7.9 oz (98.2 kg)  01/11/11 238 lb (107.956 kg)  12/07/10 235 lb (106.595 kg)  08/10/10 236 lb (107.049 kg)    % Usual Wt: 91%  Body mass index is 33.91 kg/(m^2).  Obesity  Food/Nutrition Related Hx: Pt reports weight loss PTA.   Labs:  CMP     Component Value Date/Time   NA 141 05/28/2011 0521   K 3.3* 05/28/2011 0521   CL 109 05/28/2011 0521   CO2 23 05/28/2011 0521   GLUCOSE 146* 05/28/2011 0521   BUN 14 05/28/2011 0521   CREATININE 0.98 05/28/2011 0521   CALCIUM 8.2* 05/28/2011 0521   PROT 4.7* 05/28/2011 0521   ALBUMIN 2.1* 05/28/2011 0521   AST 14 05/28/2011 0521   ALT 23 05/28/2011 0521   ALKPHOS 66 05/28/2011 0521   BILITOT 0.5 05/28/2011 0521   GFRNONAA 77* 05/28/2011 0521   GFRAA 90* 05/28/2011 0521    Intake/Output Summary (Last 24 hours) at 05/28/11 1216 Last data filed at 05/28/11 1000  Gross per 24 hour  Intake 1573.33 ml  Output    550 ml  Net 1023.33 ml     Diet Order: NPO  Supplements/Tube Feeding: none  IVF:    0.9 % NaCl with KCl 20 mEq / L Last Rate: 100 mL/hr at 05/28/11 1049    Estimated Nutritional Needs:   Kcal: 2000-2200 Protein: 85-95  gm  Fluid:  2- 2.2 L  Pt reports eating normal PTA, no problems with chewing or swallowing. Pt eats mostly vegetables, some chicken and [place]. Limits beef due to gout. Pt with weight loss, possibly related to decreased appetite with advanced age. Pt with 9% weight loss in 5 months, severe. Pt was not able to quantify meals, only that he was eating well. Does take a multivitamin at home, does not drink any nutrition supplements. Agreeable to drinking them while here after surgery.   NUTRITION DIAGNOSIS: -Inadequate oral intake (NI-2.1).  Status: Ongoing  RELATED TO: likely age  AS EVIDENCE BY: weight loss  MONITORING/EVALUATION(Goals): Goal: PO intake of meals will be adequate to maintain weight Monitor: PO intake, weight, labs, I/O's  EDUCATION NEEDS: -No education needs identified at this time  INTERVENTION: 1. Once diet advances, will add Ensure Complete BID 2. RD will follow  Dietitian [phone]  DOCUMENTATION CODES Per approved criteria  -Obesity Unspecified    Clarene Duke MARIE 05/28/2011, 12:13 PM

## 2011-05-28 NOTE — Consult Note (Signed)
FULL NOTE DICTATED PT WITH WORSENING PAIN AND SWELLING TO RIGHT HAND EXAM CONCERNING FOR DEEP SPACE INFECTION AND POSSIBLE WRIST JOINT INVOLVEMENT WILL TAKE TO OR TODAY FOR FORMAL I/D R/B/A DISCUSSED WITH PT AT BEDSIDE.  PT VOICED UNDERSTANDING OF PLAN CONSENT SIGNED DAY OF SURGERY PT SEEN AND EXAMINED PRIOR TO OPERATIVE PROCEDURE/DAY OF SURGERY SITE MARKED. QUESTIONS ANSWERED WILL REMAIN INPATIENT FOLLOWING SURGERY

## 2011-05-28 NOTE — Progress Notes (Signed)
DAILY PROGRESS NOTE                              [facility]  SUBJECTIVE: Afebrile overnight, has some pain in his right hand.  OBJECTIVE: BP 131/75  Pulse 80  Temp(Src) 98.5 F (36.9 C) (Oral)  Resp 20  Ht 5\' 7"  (1.702 m)  Wt 98.2 kg (216 lb 7.9 oz)  BMI 33.91 kg/m2  SpO2 95%  Intake/Output Summary (Last 24 hours) at 05/28/11 1547 Last data filed at 05/28/11 1000  Gross per 24 hour  Intake 1573.33 ml  Output    550 ml  Net 1023.33 ml                      Weight change:  Physical Exam: General: Alert and awake oriented x3 not in any acute distress. HEENT: anicteric sclera, pupils equal reactive to light and accommodation CVS: S1-S2 heard, no murmur rubs or gallops Chest: clear to auscultation bilaterally, no wheezing rales or rhonchi Abdomen:  normal bowel sounds, soft, nontender, nondistended, no organomegaly Neuro: Cranial nerves II-XII intact, no focal neurological deficits Extremities: no cyanosis, no clubbing or edema noted bilaterally   Lab Results:  Basename 05/28/11 0521 05/27/11 1332  NA 141 139  K 3.3* 3.0*  CL 109 101  CO2 23 26  GLUCOSE 146* 99  BUN 14 17  CREATININE 0.98 1.07  CALCIUM 8.2* 9.4  MG -- --  PHOS -- --    Basename 05/28/11 0521  AST 14  ALT 23  ALKPHOS 66  BILITOT 0.5  PROT 4.7*  ALBUMIN 2.1*   No results found for this basename: LIPASE:2,AMYLASE:2 in the last 72 hours  Basename 05/27/11 2058 05/27/11 1332  WBC 5.2 7.1  NEUTROABS 4.1 5.6  HGB 14.8 15.3  HCT 42.8 44.5  MCV 95.1 94.7  PLT 106* 129*   Micro Results: Recent Results (from the past 240 hour(s))  CULTURE, BLOOD (ROUTINE X 2)     Status: Normal (Preliminary result)   Collection Time   05/27/11  2:37 PM      Component Value Range Status Comment   Specimen Description BLOOD RIGHT ARM   Final    Special Requests BOTTLES DRAWN AEROBIC AND ANAEROBIC 6 CC EACH   Final    Culture NO GROWTH 1 DAY   Final    Report Status PENDING    Incomplete   CULTURE, BLOOD (ROUTINE X 2)     Status: Normal (Preliminary result)   Collection Time   05/27/11  2:49 PM      Component Value Range Status Comment   Specimen Description BLOOD RIGHT ARM   Final    Special Requests BOTTLES DRAWN AEROBIC AND ANAEROBIC 6 CC EACH   Final    Culture NO GROWTH 1 DAY   Final    Report Status PENDING   Incomplete   MRSA PCR SCREENING     Status: Abnormal   Collection Time   05/27/11  7:16 PM      Component Value Range Status Comment   MRSA by PCR POSITIVE (*) NEGATIVE  Final     Studies/Results: Dg Chest 2 View  05/27/2011  *RADIOLOGY REPORT*  Clinical Data: Preop radiograph.  CHEST - 2 VIEW  Comparison: None  Findings: Heart size is normal.  No pleural effusion or edema.  There is no airspace consolidation identified.  Scar -like opacity is noted in the  left base.  IMPRESSION:  1.  No acute cardiopulmonary abnormalities.  Original Report Authenticated By: Rosealee Albee, M.D.   Dg Wrist Complete Right  05/27/2011  *RADIOLOGY REPORT*  Clinical Data: Infection, redness and swelling.  RIGHT WRIST - COMPLETE 3+ VIEW  Comparison: 05/27/2011  Findings: There is no evidence of fracture or dislocation.  There is no evidence of arthropathy or other focal bone abnormality. The soft tissues appear edematous.  IMPRESSION:  1.  Soft tissue swelling.  Original Report Authenticated By: Rosealee Albee, M.D.   Dg Hand Complete Right  05/27/2011  *RADIOLOGY REPORT*  Clinical Data: Infected bug bite  RIGHT HAND - COMPLETE 3+ VIEW  Comparison: None.  Findings: nonspecific erosion involving the head of the fifth metatarsal.  No destructive bone lesion.  No acute fracture and no dislocation. Mild degenerative changes.  IMPRESSION: No acute bony pathology.  Original Report Authenticated By: Donavan Burnet, M.D.   Medications: Scheduled Meds:   . amLODipine  2.5 mg Oral q morning - 10a  . chlorhexidine  60 mL Topical Once  . Chlorhexidine Gluconate Cloth  6 each Topical  Q0600  . clindamycin (CLEOCIN) IV  600 mg Intravenous Q8H  . clindamycin (CLEOCIN) IV  900 mg Intravenous Once  . enalapril  5 mg Oral q morning - 10a  . metoprolol  50 mg Oral BID  . mulitivitamin with minerals  1 tablet Oral q morning - 10a  . mupirocin ointment  1 application Nasal BID  . omega-3 acid ethyl esters  1 g Oral Daily  . phytonadione (VITAMIN K) IV  5 mg Intravenous Once  . piperacillin-tazobactam  3.375 g Intravenous Once  . piperacillin-tazobactam (ZOSYN)  IV  3.375 g Intravenous Q8H  . potassium chloride  10 mEq Intravenous Once  . simvastatin  20 mg Oral QHS  . vancomycin  1,500 mg Intravenous Q24H  . DISCONTD: fish oil-omega-3 fatty acids  1 g Oral q morning - 10a   Continuous Infusions:   . 0.9 % NaCl with KCl 20 mEq / L 100 mL/hr at 05/28/11 1049   PRN Meds:.acetaminophen, acetaminophen, colchicine, HYDROmorphone, methocarbamol, ondansetron (ZOFRAN) IV, ondansetron  ASSESSMENT & PLAN: Principal Problem:  *Cellulitis of hand Active Problems:  Hypertension  Hip fracture, left   Right hand cellulitis -Patient seen by Dr. Melvyn Novas from hand surgery, to be evaluated today in the OR for debridement. -Patient is on vancomycin, Zosyn and clindamycin. -We'll follow closely, check CBC in the morning.  History of DVT -Patient is on Coumadin, his INR was 3.0 time of admission. -Patient received vitamin K in the emergency department. INR today is 1.5.  Left ankle pain -This is likely acute gouty arthritis. -Patient does have gout, we'll start him on colchicine.  Hypokalemia -Replete with oral supplements.  Hypertension -Continue preadmission medications. -Patient blood pressure is reasonable during this hospital stay.  Thrombocytopenia -Chronic, stable and mild. Will follow intermittently.   LOS: 1 day   Rozina Pointer A 05/28/2011, 3:47 PM

## 2011-05-28 NOTE — Anesthesia Preprocedure Evaluation (Addendum)
Anesthesia Evaluation  Patient identified by MRN, date of birth, ID band Patient awake    Reviewed: Allergy & Precautions, H&P , NPO status , [name] Chart, lab work & pertinent test results, reviewed documented beta blocker date and time   Airway Mallampati: I TM Distance: >3 FB Neck ROM: Full    Dental  (+) Teeth Intact, Partial Upper and Dental Advisory Given   Pulmonary neg pulmonary ROS,  breath sounds clear to auscultation        Cardiovascular hypertension, Pt. on medications and Pt. on home beta blockers Rhythm:Regular Rate:Normal     Neuro/Psych CVA, No Residual Symptoms negative psych ROS   GI/Hepatic negative GI ROS, Neg liver ROS,   Endo/Other  Diabetes mellitus- (diet controlled), Type obesity  Renal/GU negative Renal ROS  negative genitourinary   Musculoskeletal  (+) Arthritis -, Osteoarthritis,    Abdominal (+) + obese,   Peds  Hematology DVT--Coumadin 05/27/11   Anesthesia Other Findings   Reproductive/Obstetrics                          Anesthesia Physical Anesthesia Plan  ASA: II  Anesthesia Plan: General   Post-op Pain Management:    Induction: Intravenous  Airway Management Planned: LMA  Additional Equipment:   Intra-op Plan:   Post-operative Plan: Extubation in OR  Informed Consent: I have reviewed the patients History and Physical, chart, labs and discussed the procedure including the risks, benefits and alternatives for the proposed anesthesia with the patient or authorized representative who has indicated his/her understanding and acceptance.   Dental advisory given  Plan Discussed with: CRNA, Anesthesiologist and Surgeon  Anesthesia Plan Comments:         Anesthesia Quick Evaluation

## 2011-05-28 NOTE — Transfer of Care (Signed)
Immediate Anesthesia Transfer of Care Note  Patient: [name]  Procedure(s) Performed: Procedure(s) (LRB): IRRIGATION AND DEBRIDEMENT EXTREMITY (Right)  Patient Location: PACU  Anesthesia Type: General  Level of Consciousness: awake, alert , oriented and patient cooperative  Airway & Oxygen Therapy: Patient Spontanous Breathing and Patient connected to nasal cannula oxygen  Post-op Assessment: Report given to PACU RN, Post -op Vital signs reviewed and stable and Patient moving all extremities  Post vital signs: Reviewed and stable  Complications: No apparent anesthesia complications

## 2011-05-28 NOTE — Brief Op Note (Signed)
05/27/2011 - 05/28/2011  5:05 PM  PATIENT:  [name]  78 y.o. male  PRE-OPERATIVE DIAGNOSIS:  infected right hand/wrist  POST-OPERATIVE DIAGNOSIS:  infected right hand/wrist  PROCEDURE:  Procedure(s) (LRB): IRRIGATION AND DEBRIDEMENT EXTREMITY (Right)  SURGEON:  Surgeon(s) and Role:    * Sharma Covert, MD - Primary  PHYSICIAN ASSISTANT:   ASSISTANTS: none   ANESTHESIA:   general  EBL:  Total I/O In: -  Out: 250 [Urine:250]  BLOOD ADMINISTERED:none  DRAINS: none   LOCAL MEDICATIONS USED:  NONE  SPECIMEN:  No Specimen  DISPOSITION OF SPECIMEN:  N/A  COUNTS:  YES  TOURNIQUET:   Total Tourniquet Time Documented: Upper Arm (Right) - 12 minutes  DICTATION: [id number]  PLAN OF CARE: Admit to inpatient   PATIENT DISPOSITION:  PACU - hemodynamically stable.   Delay start of Pharmacological VTE agent (>24hrs) due to surgical blood loss or risk of bleeding: not applicable

## 2011-05-28 NOTE — Progress Notes (Signed)
Utilization review complete 

## 2011-05-29 DIAGNOSIS — L03119 Cellulitis of unspecified part of limb: Secondary | ICD-10-CM

## 2011-05-29 DIAGNOSIS — L02519 Cutaneous abscess of unspecified hand: Secondary | ICD-10-CM

## 2011-05-29 DIAGNOSIS — I10 Essential (primary) hypertension: Secondary | ICD-10-CM

## 2011-05-29 DIAGNOSIS — F29 Unspecified psychosis not due to a substance or known physiological condition: Secondary | ICD-10-CM

## 2011-05-29 DIAGNOSIS — D684 Acquired coagulation factor deficiency: Secondary | ICD-10-CM

## 2011-05-29 LAB — CBC
HCT: 35.9 % — ABNORMAL LOW (ref 39.0–52.0)
MCHC: 34.5 g/dL (ref 30.0–36.0)
Platelets: 105 10*3/uL — ABNORMAL LOW (ref 150–400)
RDW: 13.9 % (ref 11.5–15.5)
WBC: 6.7 10*3/uL (ref 4.0–10.5)

## 2011-05-29 LAB — BASIC METABOLIC PANEL
BUN: 9 mg/dL (ref 6–23)
Calcium: 8.3 mg/dL — ABNORMAL LOW (ref 8.4–10.5)
Chloride: 107 mEq/L (ref 96–112)
Creatinine, Ser: 0.99 mg/dL (ref 0.50–1.35)
GFR calc Af Amer: 89 mL/min — ABNORMAL LOW (ref >90)
GFR calc non Af Amer: 77 mL/min — ABNORMAL LOW (ref >90)

## 2011-05-29 MED ORDER — NITROGLYCERIN 0.4 MG SL SUBL
0.4000 mg | SUBLINGUAL_TABLET | Freq: Once | SUBLINGUAL | Status: AC
  Administered 2011-05-29: 0.4 mg via SUBLINGUAL

## 2011-05-29 MED ORDER — ALUM & MAG HYDROXIDE-SIMETH 200-200-20 MG/5ML PO SUSP
30.0000 mL | Freq: Once | ORAL | Status: AC
  Administered 2011-05-30: 30 mL via ORAL
  Filled 2011-05-29: qty 30

## 2011-05-29 MED ORDER — ASPIRIN EC 81 MG PO TBEC
81.0000 mg | DELAYED_RELEASE_TABLET | Freq: Once | ORAL | Status: AC
Start: 2011-05-30 — End: 2011-05-30
  Administered 2011-05-30: 81 mg via ORAL
  Filled 2011-05-29: qty 1

## 2011-05-29 NOTE — Care Management Note (Signed)
    Page 1 of 1   05/29/2011     4:51:20 PM   CARE MANAGEMENT NOTE 05/29/2011  Patient:  [name], [name]   Account Number:  [id number]  Date Initiated:  05/28/2011  Documentation initiated by:  Donn Pierini  Subjective/Objective Assessment:   Pt admitted with cellulitis     Action/Plan:   PTA pt lived at home with spouse,   Anticipated DC Date:  06/01/2011   Anticipated DC Plan:  HOME/SELF CARE      DC Planning Services  CM consult      Choice offered to / List presented to:             Status of service:  In process, will continue to follow Medicare Important Message given?   (If response is "NO", the following Medicare IM given date fields will be blank) Date Medicare IM given:   Date Additional Medicare IM given:    Discharge Disposition:    Per UR Regulation:    If discussed at Long Length of Stay Meetings, dates discussed:    Comments:  PCP- [place]  05/29/11- 1500- Donn Pierini RN, BSN [phone] Spoke with pt and sister at bedside- per conversation pt lives at home with wife- has cane and walker at home. Pt concerned about functioning at home- asked about therapy- spoke with MD regarding PT/OT evals- needs orders. Pt states that he has medication coverage and transportation home. CM to follow for d/c needs- pt may need HH and has used [facility] in the past.  05/28/11- 1520- Donn Pierini RN, BSN [phone] Pt for OR today for I&D, CM to follow for d/c needs.

## 2011-05-29 NOTE — Op Note (Signed)
[name], [name]               ACCOUNT NO.:  [id number]  MEDICAL RECORD NO.:  [id number]  LOCATION:                                 FACILITY:  PHYSICIAN:  Madelynn Done, MD  DATE OF BIRTH:  [birth year]  DATE OF PROCEDURE:  05/28/2011 DATE OF DISCHARGE:                              OPERATIVE REPORT   PREOPERATIVE DIAGNOSIS:  Right wrist and hand infection.  POSTOPERATIVE DIAGNOSIS:  Right wrist and hand infection.  SURGICAL PROCEDURES: 1. Right wrist arthrotomy and exploration and drainage. 2. Right wrist 4th dorsal compartment tenosynovectomy. 3. Right long finger incision and drainage, dorsal aspect of the long     finger.  ATTENDING PHYSICIAN:  Madelynn Done, MD, who scrubbed and present for the entire procedure.  ASSISTANT SURGEON:  None.  ANESTHESIA:  General via LMA.  SURGICAL INDICATIONS:  [name] is a 78 year old gentleman who had increasing pain in his right hand since Saturday.  He was working in the yard and noticed increased swelling and pain.  The patient is concerned for an infection.  The patient was seen and evaluated on the floor, and based on his exam findings, it was recommended that he undergo the above procedure.  Risks, benefits, and alternatives were discussed in detail with the patient and signed informed consent was obtained.  Risks include, but are not limited to, bleeding, infection, damage to nearby nerves, arteries, or tendons, loss of motion of the wrist and digits, and need for further surgical intervention.  DESCRIPTION OF THE PROCEDURE:  The patient was appropriately identified in the preop holding area, a mark with a permanent marker was made on the right hand to indicate the correct operative site.  The patient was then brought back to the operating room and placed supine on the anesthesia room table.  General anesthesia was administered, the patient tolerated this well.  A well-padded tourniquet was then placed on  the right brachium, sealed with 1000 drape.  The right upper extremity was then prepped and draped in normal sterile fashion.  Time-out was called, correct side was identified, and the procedure was then begun. Attention was then turned to the right hand where a longitudinal incision was made directly over the 4th dorsal compartment as well as on the dorsal aspect of the right long finger where the 2 areas of concern were.  The tourniquet had been insufflated.  Dissection was then carried down through the skin and subcutaneous tissue on the long finger where I did encounter any gross purulence.  The patient did have a moderate amount of fibrinous and exudative tissue along the course of the long finger extensor mechanism.  This was then sharply debrided.  Incision and drainage was then carried out of this region.  Attention was then turned to the 4th dorsal compartment where the retinaculum was then incised, and the patient did have a moderate amount of fluid exuding from the 4th dorsal compartment.  The patient had extensive tenosynovitis of the 4th dorsal compartment tendons and extensor tenosynovectomy was then carried out of each individual tendon.  Based on the concern of the wrist joint involvement and whether or not this fluid was  coming from the joint, a joint arthrotomy was then carried out.  The wrist joint was then opened and there did not appear to be an abundant amount of fluid coming from the joint, the joint looked good. The patient did have the arthritic changes noted within the joint but did not appear to have a septic arthritis.  After joint arthrotomy and exploration and drainage, the wound was then thoroughly irrigated. Tenosynovectomy was then completed and thorough irrigation was then done.  Both wounds were then copiously irrigated and then after a thorough irrigation and drainage, the skin was then loosely reapproximated with 3-0 Prolene sutures.  Xeroform dressing  and sterile compressive bandage were then applied.  The patient was then placed in a well-padded volar splint, extubated, and taken to recovery room in good condition.  POSTPROCEDURE PLAN:  The patient will be admitted back to the Medicine Service, and I will continue to follow him as an inpatient.  Continue on the IV antibiotics until the cultures.  We did take fluid cultures as well as tissue culture.  Follow him while he is an inpatient.     Madelynn Done, MD     FWO/MEDQ  D:  05/28/2011  T:  05/28/2011  Job:  [id number]

## 2011-05-29 NOTE — Progress Notes (Signed)
ANTIBIOTIC CONSULT NOTE - FOLLOW UP  Pharmacy Consult for Vancomycin and Zosyn Indication: right hand cellulitis/abscess  Allergies  Allergen Reactions  . Tetanus Toxoids Swelling    Also allergic to "high powered pain medications."    Patient Measurements: Height: 5\' 7"  (170.2 cm) Weight: 216 lb 7.9 oz (98.2 kg) IBW/kg (Calculated) : 66.1   Vital Signs: Temp: 98.1 F (36.7 C) (05/03 1405) Temp src: Oral (05/03 1405) BP: 130/83 mmHg (05/03 1405) Pulse Rate: 79  (05/03 1405) Intake/Output from previous day: 05/02 0701 - 05/03 0700 In: 750 [I.V.:750] Out: 250 [Urine:250] Intake/Output from this shift: Total I/O In: 240 [P.O.:240] Out: 1750 [Urine:1750]  Labs:  Basename 05/29/11 0555 05/28/11 0521 05/27/11 2058 05/27/11 1332  WBC 6.7 -- 5.2 7.1  HGB 12.4* -- 14.8 15.3  PLT 105* -- 106* 129*  LABCREA -- -- -- --  CREATININE 0.99 0.98 -- 1.07   Estimated Creatinine Clearance: 69.7 ml/min (by C-G formula based on Cr of 0.99). No results found for this basename: VANCOTROUGH:2,VANCOPEAK:2,VANCORANDOM:2,GENTTROUGH:2,GENTPEAK:2,GENTRANDOM:2,TOBRATROUGH:2,TOBRAPEAK:2,TOBRARND:2,AMIKACINPEAK:2,AMIKACINTROU:2,AMIKACIN:2, in the last 72 hours   Microbiology: Recent Results (from the past 720 hour(s))  CULTURE, BLOOD (ROUTINE X 2)     Status: Normal (Preliminary result)   Collection Time   05/27/11  2:37 PM      Component Value Range Status Comment   Specimen Description BLOOD RIGHT ARM   Final    Special Requests BOTTLES DRAWN AEROBIC AND ANAEROBIC 6 CC EACH   Final    Culture NO GROWTH 2 DAYS   Final    Report Status PENDING   Incomplete   CULTURE, BLOOD (ROUTINE X 2)     Status: Normal (Preliminary result)   Collection Time   05/27/11  2:49 PM      Component Value Range Status Comment   Specimen Description BLOOD RIGHT ARM   Final    Special Requests BOTTLES DRAWN AEROBIC AND ANAEROBIC 6 CC EACH   Final    Culture NO GROWTH 2 DAYS   Final    Report Status PENDING    Incomplete   MRSA PCR SCREENING     Status: Abnormal   Collection Time   05/27/11  7:16 PM      Component Value Range Status Comment   MRSA by PCR POSITIVE (*) NEGATIVE  Final   CULTURE, ROUTINE-ABSCESS     Status: Normal (Preliminary result)   Collection Time   05/28/11  4:33 PM      Component Value Range Status Comment   Specimen Description ABSCESS WRIST RIGHT   Final    Special Requests PATIENT ON FOLLOWING ZOSYN,CLINDAMYCIN   Final    Gram Stain PENDING   Incomplete    Culture NO GROWTH 1 DAY   Final    Report Status PENDING   Incomplete   ANAEROBIC CULTURE     Status: Normal (Preliminary result)   Collection Time   05/28/11  4:33 PM      Component Value Range Status Comment   Specimen Description ABSCESS WRIST RIGHT   Final    Special Requests PATIENT ON FOLLOWING ZOSYN,CLINDAMYCIN   Final    Gram Stain     Final    Value: NO WBC SEEN     NO SQUAMOUS EPITHELIAL CELLS SEEN     NO ORGANISMS SEEN   Culture     Final    Value: NO ANAEROBES ISOLATED; CULTURE IN PROGRESS FOR 5 DAYS   Report Status PENDING   Incomplete   FUNGUS CULTURE W SMEAR  Status: Normal (Preliminary result)   Collection Time   05/28/11  4:33 PM      Component Value Range Status Comment   Specimen Description ABSCESS WRIST RIGHT   Final    Special Requests PATIENT ON FOLLOWING ZOSYN,CLINDAMYCIN   Final    Fungal Smear NO YEAST OR FUNGAL ELEMENTS SEEN   Final    Culture CULTURE IN PROGRESS FOR FOUR WEEKS   Final    Report Status PENDING   Incomplete   TISSUE CULTURE     Status: Normal (Preliminary result)   Collection Time   05/28/11  4:35 PM      Component Value Range Status Comment   Specimen Description TISSUE WRIST RIGHT   Final    Special Requests PATIENT ON FOLLOWING ZOSYN,CLINDAMYCIN   Final    Gram Stain     Final    Value: FEW WBC PRESENT,BOTH PMN AND MONONUCLEAR     NO SQUAMOUS EPITHELIAL CELLS SEEN     RARE GRAM POSITIVE COCCI IN PAIRS   Culture NO GROWTH 1 DAY   Final    Report Status PENDING    Incomplete   FUNGUS CULTURE W SMEAR     Status: Normal (Preliminary result)   Collection Time   05/28/11  4:35 PM      Component Value Range Status Comment   Specimen Description TISSUE WRIST RIGHT   Final    Special Requests PATIENT ON FOLLOWING ZOSYN,CLINDAMYCIN   Final    Fungal Smear NO YEAST OR FUNGAL ELEMENTS SEEN   Final    Culture CULTURE IN PROGRESS FOR FOUR WEEKS   Final    Report Status PENDING   Incomplete   ANAEROBIC CULTURE     Status: Normal (Preliminary result)   Collection Time   05/28/11  4:35 PM      Component Value Range Status Comment   Specimen Description TISSUE WRIST RIGHT   Final    Special Requests PATIENT ON FOLLOWING ZOSYN,CLINDAMYCIN   Final    Gram Stain PENDING   Incomplete    Culture     Final    Value: NO ANAEROBES ISOLATED; CULTURE IN PROGRESS FOR 5 DAYS   Report Status PENDING   Incomplete     Anti-infectives     Start     Dose/Rate Route Frequency Ordered Stop   05/27/11 2200   clindamycin (CLEOCIN) IVPB 600 mg        600 mg 100 mL/hr over 30 Minutes Intravenous 3 times per day 05/27/11 2034     05/27/11 2200  piperacillin-tazobactam (ZOSYN) IVPB 3.375 g       3.375 g 12.5 mL/hr over 240 Minutes Intravenous 3 times per day 05/27/11 2053     05/27/11 2200   vancomycin (VANCOCIN) 1,500 mg in sodium chloride 0.9 % 500 mL IVPB        1,500 mg 250 mL/hr over 120 Minutes Intravenous Every 24 hours 05/27/11 2053     05/27/11 1345   clindamycin (CLEOCIN) 900 mg in dextrose 5 % 100 mL IVPB  Status:  Discontinued        900 mg 200 mL/hr over 30 Minutes Intravenous  Once 05/27/11 1333 05/27/11 1342   05/27/11 1345  piperacillin-tazobactam (ZOSYN) IVPB 3.375 g       3.375 g 12.5 mL/hr over 240 Minutes Intravenous  Once 05/27/11 1333 05/27/11 1608   05/27/11 1345   clindamycin (CLEOCIN) IVPB 900 mg        900 mg 100 mL/hr over  30 Minutes Intravenous  Once 05/27/11 1341 05/27/11 1737          Assessment: Right hand abscess/cellulitis:  To  continue therapy with Vancomycin, Zosyn, and Clindamycin.  Renal function is stable.  Micro data is pending.  Goal of Therapy:  Vancomycin trough level 10-15 mcg/ml  Plan:  Continue current Vancomycin, Zosyn, and Clindamycin regimens. Consider streamlining antibiotics (could discontinue Clindamycin if Group A Strep is not suspected). Follow-up microbiologic data and renal function. Check Vancomycin trough at steady state if Vancomycin is to continue.  Estella Husk, Pharm.D., BCPS Clinical Pharmacist  Pager [phone] 05/29/2011, 4:26 PM

## 2011-05-29 NOTE — Progress Notes (Signed)
DAILY PROGRESS NOTE                              [facility]  SUBJECTIVE: Afebrile overnight, status post I&D  OBJECTIVE: BP 148/69  Pulse 75  Temp(Src) 98.5 F (36.9 C) (Oral)  Resp 20  Ht 5\' 7"  (1.702 m)  Wt 98.2 kg (216 lb 7.9 oz)  BMI 33.91 kg/m2  SpO2 96%  Intake/Output Summary (Last 24 hours) at 05/29/11 1112 Last data filed at 05/29/11 1051  Gross per 24 hour  Intake    990 ml  Output    900 ml  Net     90 ml                      Weight change:  Physical Exam: General: Alert and awake oriented x3 not in any acute distress. HEENT: anicteric sclera, pupils equal reactive to light and accommodation CVS: S1-S2 heard, no murmur rubs or gallops Chest: clear to auscultation bilaterally, no wheezing rales or rhonchi Abdomen:  normal bowel sounds, soft, nontender, nondistended, no organomegaly Neuro: Cranial nerves II-XII intact, no focal neurological deficits Extremities: no cyanosis, no clubbing or edema noted bilaterally   Lab Results:  Basename 05/29/11 0555 05/28/11 0521  NA 137 141  K 3.9 3.3*  CL 107 109  CO2 23 23  GLUCOSE 111* 146*  BUN 9 14  CREATININE 0.99 0.98  CALCIUM 8.3* 8.2*  MG -- --  PHOS -- --    Basename 05/28/11 0521  AST 14  ALT 23  ALKPHOS 66  BILITOT 0.5  PROT 4.7*  ALBUMIN 2.1*   No results found for this basename: LIPASE:2,AMYLASE:2 in the last 72 hours  Basename 05/29/11 0555 05/27/11 2058 05/27/11 1332  WBC 6.7 5.2 --  NEUTROABS -- 4.1 5.6  HGB 12.4* 14.8 --  HCT 35.9* 42.8 --  MCV 95.2 95.1 --  PLT 105* 106* --   Micro Results: Recent Results (from the past 240 hour(s))  CULTURE, BLOOD (ROUTINE X 2)     Status: Normal (Preliminary result)   Collection Time   05/27/11  2:37 PM      Component Value Range Status Comment   Specimen Description BLOOD RIGHT ARM   Final    Special Requests BOTTLES DRAWN AEROBIC AND ANAEROBIC 6 CC EACH   Final    Culture NO GROWTH 2 DAYS   Final    Report  Status PENDING   Incomplete   CULTURE, BLOOD (ROUTINE X 2)     Status: Normal (Preliminary result)   Collection Time   05/27/11  2:49 PM      Component Value Range Status Comment   Specimen Description BLOOD RIGHT ARM   Final    Special Requests BOTTLES DRAWN AEROBIC AND ANAEROBIC 6 CC EACH   Final    Culture NO GROWTH 2 DAYS   Final    Report Status PENDING   Incomplete   MRSA PCR SCREENING     Status: Abnormal   Collection Time   05/27/11  7:16 PM      Component Value Range Status Comment   MRSA by PCR POSITIVE (*) NEGATIVE  Final   CULTURE, ROUTINE-ABSCESS     Status: Normal (Preliminary result)   Collection Time   05/28/11  4:33 PM      Component Value Range Status Comment   Specimen Description ABSCESS WRIST RIGHT   Final  Special Requests PATIENT ON FOLLOWING ZOSYN,CLINDAMYCIN   Final    Gram Stain PENDING   Incomplete    Culture NO GROWTH 1 DAY   Final    Report Status PENDING   Incomplete   ANAEROBIC CULTURE     Status: Normal (Preliminary result)   Collection Time   05/28/11  4:33 PM      Component Value Range Status Comment   Specimen Description ABSCESS WRIST RIGHT   Final    Special Requests PATIENT ON FOLLOWING ZOSYN,CLINDAMYCIN   Final    Gram Stain     Final    Value: NO WBC SEEN     NO SQUAMOUS EPITHELIAL CELLS SEEN     NO ORGANISMS SEEN   Culture PENDING   Incomplete    Report Status PENDING   Incomplete   TISSUE CULTURE     Status: Normal (Preliminary result)   Collection Time   05/28/11  4:35 PM      Component Value Range Status Comment   Specimen Description TISSUE WRIST RIGHT   Final    Special Requests PATIENT ON FOLLOWING ZOSYN,CLINDAMYCIN   Final    Gram Stain     Final    Value: FEW WBC PRESENT,BOTH PMN AND MONONUCLEAR     NO SQUAMOUS EPITHELIAL CELLS SEEN     RARE GRAM POSITIVE COCCI IN PAIRS   Culture NO GROWTH 1 DAY   Final    Report Status PENDING   Incomplete     Studies/Results: Dg Chest 2 View  05/27/2011  *RADIOLOGY REPORT*  Clinical Data:  Preop radiograph.  CHEST - 2 VIEW  Comparison: None  Findings: Heart size is normal.  No pleural effusion or edema.  There is no airspace consolidation identified.  Scar -like opacity is noted in the left base.  IMPRESSION:  1.  No acute cardiopulmonary abnormalities.  Original Report Authenticated By: Rosealee Albee, M.D.   Dg Wrist Complete Right  05/27/2011  *RADIOLOGY REPORT*  Clinical Data: Infection, redness and swelling.  RIGHT WRIST - COMPLETE 3+ VIEW  Comparison: 05/27/2011  Findings: There is no evidence of fracture or dislocation.  There is no evidence of arthropathy or other focal bone abnormality. The soft tissues appear edematous.  IMPRESSION:  1.  Soft tissue swelling.  Original Report Authenticated By: Rosealee Albee, M.D.   Dg Hand Complete Right  05/27/2011  *RADIOLOGY REPORT*  Clinical Data: Infected bug bite  RIGHT HAND - COMPLETE 3+ VIEW  Comparison: None.  Findings: nonspecific erosion involving the head of the fifth metatarsal.  No destructive bone lesion.  No acute fracture and no dislocation. Mild degenerative changes.  IMPRESSION: No acute bony pathology.  Original Report Authenticated By: Donavan Burnet, M.D.   Medications: Scheduled Meds:    . amLODipine  2.5 mg Oral q morning - 10a  . Chlorhexidine Gluconate Cloth  6 each Topical Q0600  . clindamycin (CLEOCIN) IV  600 mg Intravenous Q8H  . colchicine  0.6 mg Oral Daily  . enalapril  5 mg Oral q morning - 10a  . metoprolol  50 mg Oral BID  . mulitivitamin with minerals  1 tablet Oral q morning - 10a  . mupirocin ointment  1 application Nasal BID  . omega-3 acid ethyl esters  1 g Oral Daily  . piperacillin-tazobactam (ZOSYN)  IV  3.375 g Intravenous Q8H  . potassium chloride  40 mEq Oral Q6H  . simvastatin  20 mg Oral QHS  . vancomycin  1,500 mg  Intravenous Q24H   Continuous Infusions:    . 0.9 % NaCl with KCl 20 mEq / L 100 mL/hr at 05/28/11 2331   PRN Meds:.acetaminophen, acetaminophen, HYDROmorphone,  methocarbamol, ondansetron (ZOFRAN) IV, ondansetron, DISCONTD: colchicine, DISCONTD:  HYDROmorphone (DILAUDID) injection, DISCONTD:  morphine injection, DISCONTD: ondansetron (ZOFRAN) IV, DISCONTD: sodium chloride irrigation  ASSESSMENT & PLAN: Principal Problem:  *Cellulitis of hand Active Problems:  Hypertension  Hip fracture, left   Right hand cellulitis -Patient seen by Dr. Melvyn Novas from hand surgery, to be evaluated today in the OR for debridement. -Patient is on vancomycin, Zosyn and clindamycin, we will continue today. -Probably will be okay for discharge in the morning if it's okay with Dr. Melvyn Novas.  History of DVT -Patient is on Coumadin, his INR was 3.0 time of admission. -Patient received vitamin K in the emergency department. INR today is 1.5.  Left ankle pain -This is likely acute gouty arthritis. -Patient does have gout, we'll start him on colchicine.  Hypokalemia -Repleted with oral supplements.  Hypertension -Continue preadmission medications. -Patient blood pressure is reasonable during this hospital stay.  Thrombocytopenia -Chronic, stable and mild. Will follow intermittently.   LOS: 2 days   Lala Been A 05/29/2011, 11:12 AM

## 2011-05-29 NOTE — Consult Note (Signed)
NAMERIK, WADEL               ACCOUNT NO.:  [id number]  MEDICAL RECORD NO.:  [id number]  LOCATION:                                 FACILITY:  PHYSICIAN:  Madelynn Done, MD  DATE OF BIRTH:  1931-03-04  DATE OF CONSULTATION:  05/28/2011 DATE OF DISCHARGE:                                CONSULTATION   REQUESTING PHYSICIAN:  Eduard Clos, MD  CHIEF COMPLAINT:  Right hand pain.  BRIEF HISTORY:  This is a 78 year old male with hypertension, hyperlipidemia, on Coumadin for DVT after left hip surgery who had an increasing pain in his right hand for over 5 days.  He was working in his yard at home with some rose pots when he suddenly got bit by something on his right hand.  He does not recall what bit him.  Pain and swelling in his right hand developed.  He went to see his primary care physician, and he was prescribed oral antibiotics.  Despite the antibiotics, his hand pain worsened and swelling.  He was brought to the [facility] Emergency Department where the soft tissue abnormality was noted.  He was transferred to [facility] for further definitive treatment.  He received clindamycin in [facility]  His past medical history, past surgical history, social history, allergies, medications, and review of systems are as noted in the note by Dr. Toniann Fail.  The patient's radiographs were reviewed and the reports are as noted in the chart.  On examination, he is a healthy appearing male.  Height and weight are listed in the computer. He has a good hand coordination in his left hand.  Normal mood.  He is alert and oriented to person, place, and time, in no acute distress.  On examination of the right upper extremity, the patient does have the marked swelling and fluctuant area directly over the 4th dorsal compartment and directly over the dorsal aspect of the wrist joint.  He also has a mild fluctuant area over the long finger metacarpal head region.  He is able  to make the [facility] sign, cross his fingers, extend his thumb.  His fingertips are warm.  He does not have any open wounds.  He has limited wrist flexion and extension as well as forearm rotation.  IMPRESSION:  Right dorsal hand swelling with concern for deep space infection, tenosynovitis versus wrist septic arthritis.  PLAN:  Today, the findings were reviewed with the patient.  After talking with him, it is my recommendation that the patient undergo formal incision and drainage and wound cultures.  There is a concern for an occult infection.  He will be taken to the OR to undergo open debridement and joint arthrotomy, cultures for aerobic, anaerobic, and fungus.  I will plan to do this today.  The patient voiced understanding to the plan and the reason for the intervention.  Continue to follow him as an inpatient. All questions were answered and encouraged to him today.     Madelynn Done, MD     FWO/MEDQ  D:  05/28/2011  T:  05/28/2011  Job:  [id number]

## 2011-05-29 NOTE — Progress Notes (Signed)
PT SEEN EXAMINED CULTURES GROWING OUT RARE GRAM POSITIVE COCCI WOULD CONTINUE WITH IV ABX UNTIL CULTURES BACK WILL CHECK WOUND ON 5/5 CONTINUE INPT CARE IF WOUND OK ON 5/5 AND CULTURES BACK LIKELY D/C ON 5/5 I WILL BE OUT OF TOWN TOMORROW AND BACK ON Sunday TO SEE HIM

## 2011-05-30 DIAGNOSIS — F29 Unspecified psychosis not due to a substance or known physiological condition: Secondary | ICD-10-CM

## 2011-05-30 DIAGNOSIS — D684 Acquired coagulation factor deficiency: Secondary | ICD-10-CM

## 2011-05-30 DIAGNOSIS — L03119 Cellulitis of unspecified part of limb: Secondary | ICD-10-CM

## 2011-05-30 DIAGNOSIS — I10 Essential (primary) hypertension: Secondary | ICD-10-CM

## 2011-05-30 DIAGNOSIS — L02519 Cutaneous abscess of unspecified hand: Secondary | ICD-10-CM

## 2011-05-30 LAB — CARDIAC PANEL(CRET KIN+CKTOT+MB+TROPI)
CK, MB: 2.1 ng/mL (ref 0.3–4.0)
CK, MB: 2.7 ng/mL (ref 0.3–4.0)
CK, MB: 2.9 ng/mL (ref 0.3–4.0)
Total CK: 29 U/L (ref 7–232)
Troponin I: <0.3 ng/mL (ref <0.30)

## 2011-05-30 MED ORDER — NITROGLYCERIN 0.4 MG SL SUBL
SUBLINGUAL_TABLET | SUBLINGUAL | Status: AC
  Administered 2011-05-29: 0.4 mg via SUBLINGUAL
  Filled 2011-05-30: qty 25

## 2011-05-30 NOTE — Progress Notes (Signed)
Overnight events noted.  [name] Pager: [phone] 05/30/2011, 10:23 AM

## 2011-05-30 NOTE — Progress Notes (Signed)
DAILY PROGRESS NOTE                              GENERAL [facility]NAL [facility]IC[facility]  SUBJECTIVE: Had chest pain last night, feels okay now denies any chest pain  OBJECTIVE: BP 125/70  Pulse 81  Temp(Src) 98.4 F (36.9 C) (Oral)  Resp 20  Ht 5\' 7"  (1.702 m)  Wt 98.2 kg (216 lb 7.9 oz)  BMI 33.91 kg/m2  SpO2 90%  Intake/Output Summary (Last 24 hours) at 05/30/11 1020 Last data filed at 05/30/11 0300  Gross per 24 hour  Intake    720 ml  Output   2801 ml  Net  -2081 ml                      Weight change:  Physical Exam: General: Alert and awake oriented x3 not in any acute distress. HEENT: anicteric sclera, pupils equal reactive to light and accommodation CVS: S1-S2 heard, no murmur rubs or gallops Chest: clear to auscultation bilaterally, no wheezing rales or rhonchi Abdomen:  normal bowel sounds, soft, nontender, nondistended, no organomegaly Neuro: Cranial nerves II-XII intact, no focal neurological deficits Extremities: no cyanosis, no clubbing or edema noted bilaterally   Lab Results:  Basename 05/29/11 0555 05/28/11 0521  NA 137 141  K 3.9 3.3*  CL 107 109  CO2 23 23  GLUCOSE 111* 146*  BUN 9 14  CREATININE 0.99 0.98  CALCIUM 8.3* 8.2*  MG -- --  PHOS -- --    Basename 05/28/11 0521  AST 14  ALT 23  ALKPHOS 66  BILITOT 0.5  PROT 4.7*  ALBUMIN 2.1*   No results found for this basename: LIPASE:2,AMYLASE:2 in the last 72 hours  Basename 05/29/11 0555 05/27/11 2058 05/27/11 1332  WBC 6.7 5.2 --  NEUTROABS -- 4.1 5.6  HGB 12.4* 14.8 --  HCT 35.9* 42.8 --  MCV 95.2 95.1 --  PLT 105* 106* --   Micro Results: Recent Results (from the past 240 hour(s))  CULTURE, BLOOD (ROUTINE X 2)     Status: Normal (Preliminary result)   Collection Time   05/27/11  2:37 PM      Component Value Range Status Comment   Specimen Description BLOOD RIGHT ARM   Final    Special Requests BOTTLES DRAWN AEROBIC AND ANAEROBIC 6 CC EACH   Final    Culture NO GROWTH 2  DAYS   Final    Report Status PENDING   Incomplete   CULTURE, BLOOD (ROUTINE X 2)     Status: Normal (Preliminary result)   Collection Time   05/27/11  2:49 PM      Component Value Range Status Comment   Specimen Description BLOOD RIGHT ARM   Final    Special Requests BOTTLES DRAWN AEROBIC AND ANAEROBIC 6 CC EACH   Final    Culture NO GROWTH 2 DAYS   Final    Report Status PENDING   Incomplete   MRSA PCR SCREENING     Status: Abnormal   Collection Time   05/27/11  7:16 PM      Component Value Range Status Comment   MRSA by PCR POSITIVE (*) NEGATIVE  Final   CULTURE, ROUTINE-ABSCESS     Status: Normal (Preliminary result)   Collection Time   05/28/11  4:33 PM      Component Value Range Status Comment   Specimen Description ABSCESS WRIST RIGHT  Final    Special Requests PATIENT ON FOLLOWING ZOSYN,CLINDAMYCIN   Final    Gram Stain     Final    Value: NO WBC SEEN     NO SQUAMOUS EPITHELIAL CELLS SEEN     NO ORGANISMS SEEN   Culture     Final    Value: RARE STAPHYLOCOCCUS AUREUS     Note: RIFAMPIN AND GENTAMICIN SHOULD NOT BE USED AS SINGLE DRUGS FOR TREATMENT OF STAPH INFECTIONS.   Report Status PENDING   Incomplete   ANAEROBIC CULTURE     Status: Normal (Preliminary result)   Collection Time   05/28/11  4:33 PM      Component Value Range Status Comment   Specimen Description ABSCESS WRIST RIGHT   Final    Special Requests PATIENT ON FOLLOWING ZOSYN,CLINDAMYCIN   Final    Gram Stain     Final    Value: NO WBC SEEN     NO SQUAMOUS EPITHELIAL CELLS SEEN     NO ORGANISMS SEEN   Culture     Final    Value: NO ANAEROBES ISOLATED; CULTURE IN PROGRESS FOR 5 DAYS   Report Status PENDING   Incomplete   FUNGUS CULTURE W SMEAR     Status: Normal (Preliminary result)   Collection Time   05/28/11  4:33 PM      Component Value Range Status Comment   Specimen Description ABSCESS WRIST RIGHT   Final    Special Requests PATIENT ON FOLLOWING ZOSYN,CLINDAMYCIN   Final    Fungal Smear NO YEAST OR  FUNGAL ELEMENTS SEEN   Final    Culture CULTURE IN PROGRESS FOR FOUR WEEKS   Final    Report Status PENDING   Incomplete   TISSUE CULTURE     Status: Normal (Preliminary result)   Collection Time   05/28/11  4:35 PM      Component Value Range Status Comment   Specimen Description TISSUE WRIST RIGHT   Final    Special Requests PATIENT ON FOLLOWING ZOSYN,CLINDAMYCIN   Final    Gram Stain     Final    Value: FEW WBC PRESENT,BOTH PMN AND MONONUCLEAR     NO SQUAMOUS EPITHELIAL CELLS SEEN     RARE GRAM POSITIVE COCCI IN PAIRS   Culture Culture reincubated for better growth   Final    Report Status PENDING   Incomplete   FUNGUS CULTURE W SMEAR     Status: Normal (Preliminary result)   Collection Time   05/28/11  4:35 PM      Component Value Range Status Comment   Specimen Description TISSUE WRIST RIGHT   Final    Special Requests PATIENT ON FOLLOWING ZOSYN,CLINDAMYCIN   Final    Fungal Smear NO YEAST OR FUNGAL ELEMENTS SEEN   Final    Culture CULTURE IN PROGRESS FOR FOUR WEEKS   Final    Report Status PENDING   Incomplete   ANAEROBIC CULTURE     Status: Normal (Preliminary result)   Collection Time   05/28/11  4:35 PM      Component Value Range Status Comment   Specimen Description TISSUE WRIST RIGHT   Final    Special Requests PATIENT ON FOLLOWING ZOSYN,CLINDAMYCIN   Final    Gram Stain PENDING   Incomplete    Culture     Final    Value: NO ANAEROBES ISOLATED; CULTURE IN PROGRESS FOR 5 DAYS   Report Status PENDING   Incomplete  Studies/Results: Dg Chest 2 View  05/27/2011  *RADIOLOGY REPORT*  Clinical Data: Preop radiograph.  CHEST - 2 VIEW  Comparison: None  Findings: Heart size is normal.  No pleural effusion or edema.  There is no airspace consolidation identified.  Scar -like opacity is noted in the left base.  IMPRESSION:  1.  No acute cardiopulmonary abnormalities.  Original Report Authenticated By: Rosealee Albee, M.D.   Dg Wrist Complete Right  05/27/2011  *RADIOLOGY REPORT*   Clinical Data: Infection, redness and swelling.  RIGHT WRIST - COMPLETE 3+ VIEW  Comparison: 05/27/2011  Findings: There is no evidence of fracture or dislocation.  There is no evidence of arthropathy or other focal bone abnormality. The soft tissues appear edematous.  IMPRESSION:  1.  Soft tissue swelling.  Original Report Authenticated By: Rosealee Albee, M.D.   Dg Hand Complete Right  05/27/2011  *RADIOLOGY REPORT*  Clinical Data: Infected bug bite  RIGHT HAND - COMPLETE 3+ VIEW  Comparison: None.  Findings: nonspecific erosion involving the head of the fifth metatarsal.  No destructive bone lesion.  No acute fracture and no dislocation. Mild degenerative changes.  IMPRESSION: No acute bony pathology.  Original Report Authenticated By: Donavan Burnet, M.D.   Medications: Scheduled Meds:    . alum & mag hydroxide-simeth  30 mL Oral Once  . amLODipine  2.5 mg Oral q morning - 10a  . aspirin EC  81 mg Oral Once  . Chlorhexidine Gluconate Cloth  6 each Topical Q0600  . clindamycin (CLEOCIN) IV  600 mg Intravenous Q8H  . colchicine  0.6 mg Oral Daily  . enalapril  5 mg Oral q morning - 10a  . metoprolol  50 mg Oral BID  . mulitivitamin with minerals  1 tablet Oral q morning - 10a  . mupirocin ointment  1 application Nasal BID  . nitroGLYCERIN  0.4 mg Sublingual Once  . omega-3 acid ethyl esters  1 g Oral Daily  . piperacillin-tazobactam (ZOSYN)  IV  3.375 g Intravenous Q8H  . simvastatin  20 mg Oral QHS  . vancomycin  1,500 mg Intravenous Q24H   Continuous Infusions:    . 0.9 % NaCl with KCl 20 mEq / L 100 mL/hr at 05/29/11 2011   PRN Meds:.acetaminophen, acetaminophen, HYDROmorphone, methocarbamol, ondansetron (ZOFRAN) IV, ondansetron  ASSESSMENT & PLAN: Principal Problem:  *Cellulitis of hand Active Problems:  Hypertension  Hip fracture, left   Right hand cellulitis -Patient seen by Dr. Melvyn Novas from hand surgery, patient had I&D yesterday -Patient is on vancomycin, Zosyn  and clindamycin, we will continue today. -Probably will be okay for discharge in the morning if it's okay with Dr. Melvyn Novas.  Chest pain -Patient will some chest pressure last night, this is resolved by now. -This is likely GI related, negative cardiac enzymes and EKG.  History of DVT -Patient is on Coumadin, his INR was 3.0 time of admission. -Patient received vitamin K in the emergency department. INR today is 1.5.  Left ankle pain -This is likely acute gouty arthritis. -Patient does have gout, we'll start him on colchicine.  Hypokalemia -Repleted with oral supplements.  Hypertension -Continue preadmission medications. -Patient blood pressure is reasonable during this hospital stay.  Thrombocytopenia -Chronic, stable and mild. Will follow intermittently.   LOS: 3 days   Annitta Fifield A 05/30/2011, 10:20 AM

## 2011-05-30 NOTE — Progress Notes (Signed)
[facility] progress note Chief complaint. Chest tightness. History of present illness This 78 year old male in hospital with right hand cellulitis. No significant cardiac history noted other than hypertension. He complained to his staff nurse of chest tightness from the sternum to the right breast. There was no actual chest pain and the patient has frequently been belching and complaints of gas. He states he feels this is probably dyspepsia. Nonetheless I did come and see the patient at the bedside. He denies any prior cardiac history and specifically no history of MI. He denies any radiation of the pain, diaphoresis, nausea. He states he may have seen a cardiologist distantly but is unsure. Vital signs. Temperature 98.5, pulse 82, respiration 18, blood pressure 124/75. O2 sats 94%. General appearance moderately obese elderly male in no distress. Alert, cooperative and pleasant. Cardiac. Distant heart sounds with rate and rhythm regular. No jugular venous distention. Mild pedal edema. Lungs. Diminished at the bases but otherwise clear without distress or cough. Stable O2 sats. Abdomen. Soft, obese, and somewhat protuberant with positive bowel sounds present. No pain with palpation. Impression/plan. Problem #1. Chest tightness. Per exam I suspect this is most likely GI and not cardiac in origin. Nonetheless EKG was obtained at the bedside and this does not appear significantly changed from prior EKG and does not appear suggestive of acute ischemia. Given the patient's symptoms I will provide him with Maalox for a possible indigestion. Also give him a one-time dose of 81 mg aspirin and sublingual nitroglycerin 0.4 mg for one dose. Will start patient on telemetry. Will obtain cardiac enzymes every 8 hours for a total of 3 sets. I will follow for these results and contact cardiology if they return elevated. Recheck EKG later in the a.m.

## 2011-05-30 NOTE — Progress Notes (Signed)
Pt complaining of chest tightness. NP on call notified. New orders given and STAT EKG obtained. NP at bedside. Will continue to monitor.

## 2011-05-31 DIAGNOSIS — L03119 Cellulitis of unspecified part of limb: Secondary | ICD-10-CM

## 2011-05-31 DIAGNOSIS — R21 Rash and other nonspecific skin eruption: Secondary | ICD-10-CM | POA: Diagnosis present

## 2011-05-31 DIAGNOSIS — L408 Other psoriasis: Secondary | ICD-10-CM

## 2011-05-31 DIAGNOSIS — F29 Unspecified psychosis not due to a substance or known physiological condition: Secondary | ICD-10-CM

## 2011-05-31 DIAGNOSIS — L02519 Cutaneous abscess of unspecified hand: Secondary | ICD-10-CM

## 2011-05-31 DIAGNOSIS — I10 Essential (primary) hypertension: Secondary | ICD-10-CM

## 2011-05-31 LAB — CULTURE, ROUTINE-ABSCESS: Gram Stain: NONE SEEN

## 2011-05-31 MED ORDER — SULFAMETHOXAZOLE-TRIMETHOPRIM 800-160 MG PO TABS: 1.0000 | ORAL_TABLET | Freq: Two times a day (BID) | ORAL | Status: AC

## 2011-05-31 NOTE — Discharge Summary (Signed)
HOSPITAL DISCHARGE SUMMARY  [name]  MRN: [id number]  DOB:02/15/1931  Date of Admission: 05/27/2011 Date of Discharge: 05/31/2011         LOS: 4 days   Attending Physician:  [name]  [name] PCP:  Scott Ruths, MD, MD  Consults: Treatment Team:  Scott Covert, MD   Discharge Diagnoses: Present on Admission:  .Cellulitis of hand .HTN (hypertension) .Skin rash   Medication List  As of 05/31/2011  1:44 PM   STOP taking these medications         doxycycline 100 MG tablet         TAKE these medications         amLODipine 5 MG tablet   Commonly known as: NORVASC   Take 2.5 mg by mouth every morning.      ascorbic acid 500 MG tablet   Commonly known as: VITAMIN C   Take 500 mg by mouth every morning.      calcium-vitamin D 500-200 MG-UNIT per tablet   Commonly known as: OSCAL WITH D   Take 1 tablet by mouth every morning.      colchicine 0.6 MG tablet   Take 0.6 mg by mouth 2 (two) times daily as needed. For gout      enalapril 5 MG tablet   Commonly known as: VASOTEC   Take 5 mg by mouth every morning.      fish oil-omega-3 fatty acids 1000 MG capsule   Take 1 g by mouth every morning.      methocarbamol 500 MG tablet   Commonly known as: ROBAXIN   Take 500 mg by mouth daily as needed. For muscle spasms      metoprolol 50 MG tablet   Commonly known as: LOPRESSOR   Take 50 mg by mouth 2 (two) times daily.      mulitivitamin with minerals Tabs   Take 1 tablet by mouth every morning.      simvastatin 20 MG tablet   Commonly known as: ZOCOR   Take 20 mg by mouth at bedtime.      sulfamethoxazole-trimethoprim 800-160 MG per tablet   Commonly known as: BACTRIM DS,SEPTRA DS   Take 1 tablet by mouth 2 (two) times daily.      vitamin E 400 UNIT capsule   Take 400 Units by mouth every morning.      warfarin 1 MG tablet   Commonly known as: COUMADIN   Take 4 mg by mouth daily. Take 4 tablets by mouth once daily             Brief  Admission History: 78 year-old male with history of hypertension, hyperlipidemia and TIA is on Coumadin for DVT after [name] left hip surgery in November 2012 has been experiencing increasing pain in his right hand since Saturday, that is 5 days ago. Patient states he was working in his yard at his home with some rose flowers when he suddenly got bit by something on his right hand. He does not recall what bit him. Since then the pain and swelling in his right hand has been increasing. He went to his PCP [name] day later on Monday and was prescribed doxycycline. But despite taking which the hand pain has progressively worsened. In addition his family also noticed some mental status changes where patient was found to be [name] little confused and weak. He was brought to the ER at [facility]. X-rays reveal soft tissue swelling of his right hand.  Patient has been transferred to [facility] for further management due to the need of hand surgeon evaluation. Patient was given clindamycin at [facility] and also was given vitamin K for reversal his INR which was at around 3.  In addition patient has an increasing pain of his left ankle which he states is typical of his gout.  Also noticed was rash on his left anterior thigh which is very localized and patient states it was noticed when he came to the ER and before he received any medications. The rash is non-itching and not painful and not blanching.  He does not have any difficulty swallowing, any difficulty breathing, chest pain or cough. Denies fever chills. On my exam patient is completely oriented to time place and person and is answering questions appropriately.  Hospital Course: Present on Admission:  .Cellulitis of hand .HTN (hypertension) .Skin rash  1. Right hand cellulitis: As mentioned above patient was evaluated by his primary care physician, was placed on doxycycline but his cellulitis was getting worse so he came into the hospital. Patient placed  on broad-spectrum antibiotics, namely vancomycin Zosyn and clindamycin. [name] from hand surgery evaluated the patient and did exploration and drainage for his right hand. 2 tissue culture was sent and it grew MRSA. It was resistant to clindamycin and fluoroquinolones. Patient was treated with doxycycline as outpatient before he came in to the hospital here so this was not restarted. The patient was placed on Bactrim for 10 more days. Patient follows with [name].  2. Skin rash: Patient has annular skin rash on his left thigh and around his right shoulder. She did not remember when this started. It looks like [name] geographical map with demarcated edges, and if he is younger I will stay in this is erythema marginatum. The cause of the rash is unclear to me, it might be medication or his febrile illness. Patient advised to followup with his primary care physician hopefully after the tetracycline was discontinued the rash will disappear if not patient might need dermatological referral.  3. History of DVT: Patient has history of DVT he is on Coumadin. Patient given his INR of 3.0, patient was received vitamin K in the emergency department his INR is 1.5. Patient discharged on Bactrim that will probably interact with his Coumadin he will need to close followup to his INR.  4. Left ankle pain: This is likely gouty arthritis. Patient was on colchicine in the hospital, he did very well and he can't bear weight on his ankle without any problems.   Day of Discharge BP 130/79  Pulse 72  Temp(Src) 97.8 F (36.6 C) (Oral)  Resp 18  Ht 5\' 7"  (1.702 m)  Wt 98.2 kg (216 lb 7.9 oz)  BMI 33.91 kg/m2  SpO2 96% Physical Exam: GEN: No acute distress, cooperative with exam PSYCH: He is alert and oriented x4; does not appear anxious does not appear depressed; affect is normal  HEENT: Mucous membranes pink and anicteric;  Mouth: without oral thrush or lesions Eyes: PERRLA; EOM intact;  Neck: no cervical  lymphadenopathy nor thyromegaly or carotid bruit; no JVD;  CHEST WALL: No tenderness, symmetrical to breathing bilaterally CHEST: Normal respiration, clear to auscultation bilaterally  HEART: Regular rate and rhythm; no murmurs, rubs or gallops, S1 and S2 heard  BACK: No kyphosis or scoliosis; no CVA tenderness  ABDOMEN:  soft non-tender; no masses, no organomegaly, normal abdominal bowel sounds; no pannus; no intertriginous candida.  EXTREMITIES: No bone or  joint deformity; no edema; no ulcerations.  PULSES: 2+ and symmetric, neurovascularity is intact SKIN: Normal hydration no rash or ulceration, no flushing or suspicious lesions  CNS: Cranial nerves 2-12 grossly intact no focal neurologic deficit, coordination is intact gait not tested    Results for orders placed during the hospital encounter of 05/27/11 (from the past 24 hour(s))  CARDIAC PANEL(CRET KIN+CKTOT+MB+TROPI)     Status: Normal   Collection Time   05/30/11  4:41 PM      Component Value Range   Total CK 29  7 - 232 (U/L)   CK, MB 2.9  0.3 - 4.0 (ng/mL)   Troponin I <0.30  <0.30 (ng/mL)   Relative Index RELATIVE INDEX IS INVALID  0.0 - 2.5     Disposition: Home   Follow-up Appts: Discharge Orders    Future Orders Please Complete By Expires   Diet - low sodium heart healthy      Increase activity slowly         Follow-up Information    Follow up with Scott Covert, MD. Schedule an appointment as soon as possible for [name] visit in 5 days.   Contact information:   [name] [address] [place] [address] [phone]       Follow up with Scott Ruths, MD in 1 week.   Contact information:   [address] Susan Moore [name] [address] [place] [address] [phone]          I spent 40 minutes completing paperwork and coordinating discharge efforts.  SignedClydia Llano [name] 05/31/2011, 1:44 PM

## 2011-05-31 NOTE — Progress Notes (Signed)
Pt going home with Home Health PT/OT orders. Discharge instructions reviewed with patient.. IV site to be removed and belongings bagged. Valeda Malm

## 2011-05-31 NOTE — Discharge Instructions (Signed)
KEEP BANDAGE CLEAN AND DRY °CALL OFFICE FOR F/U APPT [phone] IN 5 DAYS °KEEP HAND ELEVATED ABOVE HEART °OK TO APPLY ICE TO OPERATIVE AREA °CONTACT OFFICE IF ANY WORSENING PAIN OR CONCERNS. °

## 2011-05-31 NOTE — Progress Notes (Signed)
PT SEEN/EXAMINED WOUND LOOKS MUCH BETTER NO PURULENCE FINGERS WARM WELL PERFUSED OK TO D/C HOME ORAL ANTIBIOTICS NEEDS TO SEE ME IN OFFICE THIS THURS/FRIDAY KEEP SPLINT ON AT ALL TIMES KEEP HAND ELEVATED

## 2011-05-31 NOTE — Progress Notes (Signed)
Physical Therapy Evaluation Patient Details Name: [name] MRN: [id number] DOB: [birth year] Today's Date: 05/31/2011 Time: 4098-1191 PT Time Calculation (min): 34 min  PT Assessment / Plan / Recommendation Clinical Impression  78 yo male admitted with cellulitis right hand s/p I&D R hand and wrist presents with decr functional mobility; Will benefit from more PT services to maximize independence and safety with mobility, amb, strengthening; Noted for eminent dc home, so notified MD and RN of equipment recommendations and HHPT/OT recs as well; will set PT goals in case the pt does not dc today    PT Assessment  Patient needs continued PT services    Follow Up Recommendations  Home health PT;Supervision/Assistance - 24 hour (also HHOT services)    Equipment Recommendations   (Platform RW)    Frequency Min 3X/week    Precautions / Restrictions Precautions Precautions: Fall Restrictions Other Position/Activity Restrictions: Proceeded to WB through Right elbow only on Platform RW as pt had an I&D right hand   Pertinent Vitals/Pain no apparent distress Reports 4/10 pain in hand and in feet; RN notified      Mobility  Transfers Transfers: Sit to Stand;Stand to Sit Sit to Stand: 3: Mod assist;With upper extremity assist;From chair/3-in-1 Stand to Sit: 4: Min assist;To chair/3-in-1;With upper extremity assist Details for Transfer Assistance: Cues for safety and technique; required physical assist for successful sit to stand Ambulation/Gait Ambulation/Gait Assistance: 4: Min assist Ambulation Distance (Feet): 50 Feet Assistive device: Right platform walker Ambulation/Gait Assistance Details: Cues for posture, to WB through elbow on right; Very short shuffling steps; Pt's familiy member was present and very helpful Gait Pattern: Decreased step length - right;Decreased step length - left Stairs: No (Family member described how they will manage; pt and family member are very  confident that they can manage and decline step training)    Exercises     PT Goals Acute Rehab PT Goals PT Goal Formulation: With patient Time For Goal Achievement: 06/14/11 Potential to Achieve Goals: Good Pt will go Supine/Side to Sit: with supervision PT Goal: Supine/Side to Sit - Progress: Goal set today Pt will go Sit to Supine/Side: with supervision PT Goal: Sit to Supine/Side - Progress: Goal set today Pt will go Sit to Stand: with supervision PT Goal: Sit to Stand - Progress: Goal set today Pt will go Stand to Sit: with supervision PT Goal: Stand to Sit - Progress: Goal set today Pt will Transfer Bed to Chair/Chair to Bed: with supervision PT Transfer Goal: Bed to Chair/Chair to Bed - Progress: Goal set today Pt will Ambulate: >150 feet;with min assist;with rolling walker (platform) PT Goal: Ambulate - Progress: Goal set today Pt will Go Up / Down Stairs: 3-5 stairs;with min assist PT Goal: Up/Down Stairs - Progress: Goal set today  Visit Information  Last PT Received On: 05/31/11 Assistance Needed: +1 (Family very helpful)    Subjective Data  Subjective: "I'm not steady" Patient Stated Goal: Home today   Prior Functioning  Home Living Lives With: Spouse;Family Available Help at Discharge: Family;Personal care attendant;Available 24 hours/day Type of Home: House Home Access: Stairs to enter [employer] of Steps: 3 Entrance Stairs-Rails: None Home Layout: One level Home Adaptive Equipment: Walker - rolling;Tub transfer bench;Bedside commode/3-in-1 Prior Function Level of Independence: Needs assistance Needs Assistance: Bathing Communication Communication: No difficulties Dominant Hand: Right    Cognition  Overall Cognitive Status: Appears within functional limits for tasks assessed/performed Arousal/Alertness: Awake/alert Orientation Level: Appears intact for tasks assessed Behavior During Session: [facility]  for tasks performed    Extremity/Trunk  Assessment Right Upper Extremity Assessment RUE ROM/Strength/Tone: Deficits RUE ROM/Strength/Tone Deficits: Bulky dressing right hand and wrist Left Upper Extremity Assessment LUE ROM/Strength/Tone: WFL for tasks assessed Right Lower Extremity Assessment RLE ROM/Strength/Tone: Deficits RLE ROM/Strength/Tone Deficits: Generalized weakness, requiring physical assist for successful sit to stand Left Lower Extremity Assessment LLE ROM/Strength/Tone: Deficits LLE ROM/Strength/Tone Deficits: Generalized weakness, requiring physical assist for successful sit to stand   Balance    End of Session PT - End of Session Equipment Utilized During Treatment: Gait belt Activity Tolerance: Patient limited by fatigue Patient left: in chair;with call bell/phone within reach;with family/visitor present Nurse Communication: Mobility status (Equipment recommendations)   Olen Pel [place], [place] [phone]  05/31/2011, 5:29 PM

## 2011-05-31 NOTE — Progress Notes (Signed)
DAILY PROGRESS NOTE                              [facility]AL INTERNAL [facility]ICINE [facility]  SUBJECTIVE: Had chest pain last night, feels okay now denies any chest pain  OBJECTIVE: BP 130/79  Pulse 72  Temp(Src) 97.8 F (36.6 C) (Oral)  Resp 18  Ht 5\' 7"  (1.702 m)  Wt 98.2 kg (216 lb 7.9 oz)  BMI 33.91 kg/m2  SpO2 96%  Intake/Output Summary (Last 24 hours) at 05/31/11 1039 Last data filed at 05/31/11 9562  Gross per 24 hour  Intake   1650 ml  Output   2825 ml  Net  -1175 ml                      Weight change:  Physical Exam: General: Alert and awake oriented x3 not in any acute distress. HEENT: anicteric sclera, pupils equal reactive to light and accommodation CVS: S1-S2 heard, no murmur rubs or gallops Chest: clear to auscultation bilaterally, no wheezing rales or rhonchi Abdomen:  normal bowel sounds, soft, nontender, nondistended, no organomegaly Neuro: Cranial nerves II-XII intact, no focal neurological deficits Extremities: no cyanosis, no clubbing or edema noted bilaterally   Lab Results:  Basename 05/29/11 0555  NA 137  K 3.9  CL 107  CO2 23  GLUCOSE 111*  BUN 9  CREATININE 0.99  CALCIUM 8.3*  MG --  PHOS --   No results found for this basename: AST:2,ALT:2,ALKPHOS:2,BILITOT:2,PROT:2,ALBUMIN:2 in the last 72 hours No results found for this basename: LIPASE:2,AMYLASE:2 in the last 72 hours  Basename 05/29/11 0555  WBC 6.7  NEUTROABS --  HGB 12.4*  HCT 35.9*  MCV 95.2  PLT 105*   Micro Results: Recent Results (from the past 240 hour(s))  CULTURE, BLOOD (ROUTINE X 2)     Status: Normal (Preliminary result)   Collection Time   05/27/11  2:37 PM      Component Value Range Status Comment   Specimen Description BLOOD RIGHT ARM   Final    Special Requests BOTTLES DRAWN AEROBIC AND ANAEROBIC 6 CC EACH   Final    Culture NO GROWTH 4 DAYS   Final    Report Status PENDING   Incomplete   CULTURE, BLOOD (ROUTINE X 2)     Status: Normal (Preliminary  result)   Collection Time   05/27/11  2:49 PM      Component Value Range Status Comment   Specimen Description BLOOD RIGHT ARM   Final    Special Requests BOTTLES DRAWN AEROBIC AND ANAEROBIC 6 CC EACH   Final    Culture NO GROWTH 4 DAYS   Final    Report Status PENDING   Incomplete   MRSA PCR SCREENING     Status: Abnormal   Collection Time   05/27/11  7:16 PM      Component Value Range Status Comment   MRSA by PCR POSITIVE (*) NEGATIVE  Final   CULTURE, ROUTINE-ABSCESS     Status: Normal (Preliminary result)   Collection Time   05/28/11  4:33 PM      Component Value Range Status Comment   Specimen Description ABSCESS WRIST RIGHT   Final    Special Requests PATIENT ON FOLLOWING ZOSYN,CLINDAMYCIN   Final    Gram Stain     Final    Value: NO WBC SEEN     NO SQUAMOUS EPITHELIAL CELLS SEEN  NO ORGANISMS SEEN   Culture     Final    Value: RARE STAPHYLOCOCCUS AUREUS     Note: RIFAMPIN AND GENTAMICIN SHOULD NOT BE USED AS SINGLE DRUGS FOR TREATMENT OF STAPH INFECTIONS.   Report Status PENDING   Incomplete   ANAEROBIC CULTURE     Status: Normal (Preliminary result)   Collection Time   05/28/11  4:33 PM      Component Value Range Status Comment   Specimen Description ABSCESS WRIST RIGHT   Final    Special Requests PATIENT ON FOLLOWING ZOSYN,CLINDAMYCIN   Final    Gram Stain     Final    Value: NO WBC SEEN     NO SQUAMOUS EPITHELIAL CELLS SEEN     NO ORGANISMS SEEN   Culture     Final    Value: NO ANAEROBES ISOLATED; CULTURE IN PROGRESS FOR 5 DAYS   Report Status PENDING   Incomplete   FUNGUS CULTURE W SMEAR     Status: Normal (Preliminary result)   Collection Time   05/28/11  4:33 PM      Component Value Range Status Comment   Specimen Description ABSCESS WRIST RIGHT   Final    Special Requests PATIENT ON FOLLOWING ZOSYN,CLINDAMYCIN   Final    Fungal Smear NO YEAST OR FUNGAL ELEMENTS SEEN   Final    Culture CULTURE IN PROGRESS FOR FOUR WEEKS   Final    Report Status PENDING    Incomplete   TISSUE CULTURE     Status: Normal (Preliminary result)   Collection Time   05/28/11  4:35 PM      Component Value Range Status Comment   Specimen Description TISSUE WRIST RIGHT   Final    Special Requests PATIENT ON FOLLOWING ZOSYN,CLINDAMYCIN   Final    Gram Stain     Final    Value: FEW WBC PRESENT,BOTH PMN AND MONONUCLEAR     NO SQUAMOUS EPITHELIAL CELLS SEEN     RARE GRAM POSITIVE COCCI IN PAIRS   Culture     Final    Value: RARE STAPHYLOCOCCUS AUREUS     Note: RIFAMPIN AND GENTAMICIN SHOULD NOT BE USED AS SINGLE DRUGS FOR TREATMENT OF STAPH INFECTIONS.   Report Status PENDING   Incomplete   FUNGUS CULTURE W SMEAR     Status: Normal (Preliminary result)   Collection Time   05/28/11  4:35 PM      Component Value Range Status Comment   Specimen Description TISSUE WRIST RIGHT   Final    Special Requests PATIENT ON FOLLOWING ZOSYN,CLINDAMYCIN   Final    Fungal Smear NO YEAST OR FUNGAL ELEMENTS SEEN   Final    Culture CULTURE IN PROGRESS FOR FOUR WEEKS   Final    Report Status PENDING   Incomplete   ANAEROBIC CULTURE     Status: Normal (Preliminary result)   Collection Time   05/28/11  4:35 PM      Component Value Range Status Comment   Specimen Description TISSUE WRIST RIGHT   Final    Special Requests PATIENT ON FOLLOWING ZOSYN,CLINDAMYCIN   Final    Gram Stain PENDING   Incomplete    Culture     Final    Value: NO ANAEROBES ISOLATED; CULTURE IN PROGRESS FOR 5 DAYS   Report Status PENDING   Incomplete     Studies/Results: Dg Chest 2 View  05/27/2011  *RADIOLOGY REPORT*  Clinical Data: Preop radiograph.  CHEST - 2 VIEW  Comparison: None  Findings: Heart size is normal.  No pleural effusion or edema.  There is no airspace consolidation identified.  Scar -like opacity is noted in the left base.  IMPRESSION:  1.  No acute cardiopulmonary abnormalities.  Original Report Authenticated By: Rosealee Albee, M.D.   Dg Wrist Complete Right  05/27/2011  *RADIOLOGY REPORT*   Clinical Data: Infection, redness and swelling.  RIGHT WRIST - COMPLETE 3+ VIEW  Comparison: 05/27/2011  Findings: There is no evidence of fracture or dislocation.  There is no evidence of arthropathy or other focal bone abnormality. The soft tissues appear edematous.  IMPRESSION:  1.  Soft tissue swelling.  Original Report Authenticated By: Rosealee Albee, M.D.   Dg Hand Complete Right  05/27/2011  *RADIOLOGY REPORT*  Clinical Data: Infected bug bite  RIGHT HAND - COMPLETE 3+ VIEW  Comparison: None.  Findings: nonspecific erosion involving the head of the fifth metatarsal.  No destructive bone lesion.  No acute fracture and no dislocation. Mild degenerative changes.  IMPRESSION: No acute bony pathology.  Original Report Authenticated By: Donavan Burnet, M.D.   Medications: Scheduled Meds:    . amLODipine  2.5 mg Oral q morning - 10a  . Chlorhexidine Gluconate Cloth  6 each Topical Q0600  . clindamycin (CLEOCIN) IV  600 mg Intravenous Q8H  . colchicine  0.6 mg Oral Daily  . enalapril  5 mg Oral q morning - 10a  . metoprolol  50 mg Oral BID  . mulitivitamin with minerals  1 tablet Oral q morning - 10a  . mupirocin ointment  1 application Nasal BID  . omega-3 acid ethyl esters  1 g Oral Daily  . piperacillin-tazobactam (ZOSYN)  IV  3.375 g Intravenous Q8H  . simvastatin  20 mg Oral QHS  . vancomycin  1,500 mg Intravenous Q24H   Continuous Infusions:    . 0.9 % NaCl with KCl 20 mEq / L 100 mL/hr at 05/31/11 0614   PRN Meds:.acetaminophen, acetaminophen, HYDROmorphone, methocarbamol, ondansetron (ZOFRAN) IV, ondansetron  ASSESSMENT & PLAN: Principal Problem:  *Cellulitis of hand Active Problems:  Hypertension  Hip fracture, left   Right hand cellulitis -Patient seen by Dr. Melvyn Novas from hand surgery, patient had I&D yesterday -Patient is on vancomycin, Zosyn and clindamycin, culture showed Staphylococcus aureus, susceptibility pending. -Probably will be okay for discharge in the  morning if it's okay with Dr. Melvyn Novas.  Chest pain -Patient will some chest pressure, this is resolved by now. -This is likely GI related, negative cardiac enzymes and EKG.  History of DVT -Patient is on Coumadin, his INR was 3.0 time of admission. -Patient received vitamin K in the emergency department. INR today is 1.5.  Annular skin lesions -Lower extremity and torso lesions, looks like erythema marginatum, of unclear significance. -Might be secondary to medications versus febrile illness. -Followup as outpatient with dermatology, if no resolution of his antibiotics his cells.  Left ankle pain -This is likely acute gouty arthritis. -Patient does have gout, we'll start him on colchicine.  Hypokalemia -Repleted with oral supplements.  Hypertension -Continue preadmission medications. -Patient blood pressure is reasonable during this hospital stay.  Thrombocytopenia -Chronic, stable and mild. Will follow intermittently.  Disposition -Likely home today if it's okay with Dr. Melvyn Novas from hand surgery service.  -Can likely go home on oral antibiotic like clindamycin.   LOS: 4 days   Demecia Northway A 05/31/2011, 10:39 AM

## 2011-06-01 ENCOUNTER — Encounter (HOSPITAL_COMMUNITY): Payer: Self-pay | Admitting: Orthopedic Surgery

## 2011-06-01 LAB — CULTURE, BLOOD (ROUTINE X 2)
Culture: NO GROWTH
Culture: NO GROWTH

## 2011-06-01 LAB — TISSUE CULTURE

## 2011-06-01 NOTE — Progress Notes (Signed)
   CARE MANAGEMENT NOTE 06/01/2011  Patient:  [name], [name]   Account Number:  [id number]  Date Initiated:  05/28/2011  Documentation initiated by:  Donn Pierini  Subjective/Objective Assessment:   Pt admitted with cellulitis     Action/Plan:   PTA pt lived at home with spouse,   Anticipated DC Date:  06/01/2011   Anticipated DC Plan:  HOME/SELF CARE      DC Planning Services  CM consult      [facility] Choice  HOME HEALTH   Choice offered to / List presented to:  C-1 Patient   DME arranged  WALKER - PLATFORM      DME agency  [facility]     HH arranged  HH-2 PT  HH-3 OT      [facility] agency  [facility]   Status of service:  Completed, signed off Medicare Important Message given?   (If response is "NO", the following Medicare IM given date fields will be blank) Date Medicare IM given:   Date Additional Medicare IM given:    Discharge Disposition:  HOME W HOME HEALTH SERVICES  Per UR Regulation:    If discussed at Long Length of Stay Meetings, dates discussed:    Comments: 06/01/2011 1130 late entry from 05/31/2011 1545 Orders are in the system. Isidoro Donning RN CCM Case Mgmt phone [phone]   05/31/2011 1545 Contacted [facility] for DME and [facility] for scheduled d/c today. System issues, will verify orders issues resolved. Isidoro Donning RN CCM Case Mgmt [phone]  PCP- [place]  05/29/11- 1500- Donn Pierini RN, BSN [phone] Spoke with pt and sister at bedside- per conversation pt lives at home with wife- has cane and walker at home. Pt concerned about functioning at home- asked about therapy- spoke with MD regarding PT/OT evals- needs orders. Pt states that he has medication coverage and transportation home. CM to follow for d/c needs- pt may need HH and has used [facility] in the past.  05/28/11- 1520- Donn Pierini RN, BSN [phone] Pt for OR today for I&D, CM to follow for d/c needs.

## 2011-06-02 LAB — ANAEROBIC CULTURE: Gram Stain: NONE SEEN

## 2011-06-04 ENCOUNTER — Ambulatory Visit (INDEPENDENT_AMBULATORY_CARE_PROVIDER_SITE_OTHER): Payer: Medicare Other | Admitting: Otolaryngology

## 2011-06-04 DIAGNOSIS — H6120 Impacted cerumen, unspecified ear: Secondary | ICD-10-CM

## 2011-06-04 DIAGNOSIS — H903 Sensorineural hearing loss, bilateral: Secondary | ICD-10-CM

## 2011-06-11 LAB — FUNGUS CULTURE W SMEAR

## 2011-06-30 ENCOUNTER — Other Ambulatory Visit: Payer: Self-pay | Admitting: Rheumatology

## 2011-06-30 DIAGNOSIS — M79672 Pain in left foot: Secondary | ICD-10-CM

## 2011-07-02 ENCOUNTER — Ambulatory Visit
Admission: RE | Admit: 2011-07-02 | Discharge: 2011-07-02 | Disposition: A | Discharge status: Home / Self Care | Payer: Medicare Other | Source: Ambulatory Visit | Attending: Rheumatology | Admitting: Rheumatology

## 2011-07-02 DIAGNOSIS — M79672 Pain in left foot: Secondary | ICD-10-CM

## 2011-07-02 LAB — FUNGUS CULTURE W SMEAR

## 2011-07-21 ENCOUNTER — Ambulatory Visit (INDEPENDENT_AMBULATORY_CARE_PROVIDER_SITE_OTHER): Payer: Medicare Other | Admitting: Urology

## 2011-07-21 DIAGNOSIS — N402 Nodular prostate without lower urinary tract symptoms: Secondary | ICD-10-CM

## 2011-07-21 DIAGNOSIS — R972 Elevated prostate specific antigen [PSA]: Secondary | ICD-10-CM

## 2011-07-23 ENCOUNTER — Other Ambulatory Visit: Payer: Self-pay | Admitting: Neurology

## 2011-07-23 DIAGNOSIS — R42 Dizziness and giddiness: Secondary | ICD-10-CM

## 2011-07-27 ENCOUNTER — Other Ambulatory Visit: Payer: Self-pay | Admitting: Urology

## 2011-07-27 DIAGNOSIS — N402 Nodular prostate without lower urinary tract symptoms: Secondary | ICD-10-CM

## 2011-08-03 ENCOUNTER — Ambulatory Visit
Admission: RE | Admit: 2011-08-03 | Discharge: 2011-08-03 | Disposition: A | Discharge status: Home / Self Care | Payer: Medicare Other | Source: Ambulatory Visit | Attending: Neurology | Admitting: Neurology

## 2011-08-03 DIAGNOSIS — R42 Dizziness and giddiness: Secondary | ICD-10-CM

## 2011-08-03 MED ORDER — GADOBENATE DIMEGLUMINE 529 MG/ML IV SOLN
20.0000 mL | Freq: Once | INTRAVENOUS | Status: AC | PRN
  Administered 2011-08-03: 20 mL via INTRAVENOUS

## 2011-08-14 ENCOUNTER — Other Ambulatory Visit (HOSPITAL_COMMUNITY): Payer: Self-pay | Admitting: Rheumatology

## 2011-08-14 DIAGNOSIS — Z79899 Other long term (current) drug therapy: Secondary | ICD-10-CM

## 2011-08-14 DIAGNOSIS — M8580 Other specified disorders of bone density and structure, unspecified site: Secondary | ICD-10-CM

## 2011-08-18 ENCOUNTER — Ambulatory Visit (HOSPITAL_COMMUNITY)
Admission: RE | Admit: 2011-08-18 | Discharge: 2011-08-18 | Disposition: A | Discharge status: Home / Self Care | Payer: Medicare Other | Source: Ambulatory Visit | Attending: Rheumatology | Admitting: Rheumatology

## 2011-08-18 DIAGNOSIS — M899 Disorder of bone, unspecified: Secondary | ICD-10-CM | POA: Insufficient documentation

## 2011-08-18 DIAGNOSIS — M8580 Other specified disorders of bone density and structure, unspecified site: Secondary | ICD-10-CM

## 2011-08-18 DIAGNOSIS — Z79899 Other long term (current) drug therapy: Secondary | ICD-10-CM | POA: Insufficient documentation

## 2011-09-15 ENCOUNTER — Ambulatory Visit (HOSPITAL_COMMUNITY): Admission: RE | Admit: 2011-09-15 | Payer: Medicare Other | Source: Ambulatory Visit

## 2011-10-27 ENCOUNTER — Other Ambulatory Visit: Payer: Self-pay | Admitting: Urology

## 2011-10-27 ENCOUNTER — Inpatient Hospital Stay (HOSPITAL_COMMUNITY): Admission: RE | Admit: 2011-10-27 | Discharge: 2011-10-27 | Payer: Medicare Other | Source: Ambulatory Visit

## 2011-10-27 ENCOUNTER — Ambulatory Visit (HOSPITAL_COMMUNITY)
Admission: RE | Admit: 2011-10-27 | Discharge: 2011-10-27 | Disposition: A | Discharge status: Home / Self Care | Payer: Medicare Other | Source: Ambulatory Visit | Attending: Urology | Admitting: Urology

## 2011-10-27 DIAGNOSIS — N402 Nodular prostate without lower urinary tract symptoms: Secondary | ICD-10-CM

## 2011-10-27 NOTE — Progress Notes (Signed)
Patient tolerated procedure well. 12 specimens taken to lab by Dr. Retta Diones.

## 2011-11-06 ENCOUNTER — Other Ambulatory Visit: Payer: Self-pay | Admitting: Urology

## 2011-11-06 DIAGNOSIS — C61 Malignant neoplasm of prostate: Secondary | ICD-10-CM

## 2011-11-09 ENCOUNTER — Encounter (HOSPITAL_COMMUNITY): Payer: Self-pay

## 2011-11-09 ENCOUNTER — Encounter (HOSPITAL_COMMUNITY)
Admission: RE | Admit: 2011-11-09 | Discharge: 2011-11-09 | Disposition: A | Discharge status: Home / Self Care | Payer: Medicare Other | Source: Ambulatory Visit | Attending: Urology | Admitting: Urology

## 2011-11-09 ENCOUNTER — Ambulatory Visit (HOSPITAL_COMMUNITY)
Admission: RE | Admit: 2011-11-09 | Discharge: 2011-11-09 | Disposition: A | Discharge status: Home / Self Care | Payer: Medicare Other | Source: Ambulatory Visit | Attending: Urology | Admitting: Urology

## 2011-11-09 DIAGNOSIS — C61 Malignant neoplasm of prostate: Secondary | ICD-10-CM

## 2011-11-09 DIAGNOSIS — Q619 Cystic kidney disease, unspecified: Secondary | ICD-10-CM | POA: Insufficient documentation

## 2011-11-09 DIAGNOSIS — N200 Calculus of kidney: Secondary | ICD-10-CM | POA: Insufficient documentation

## 2011-11-09 HISTORY — DX: Type 2 diabetes mellitus without complications: E11.9

## 2011-11-09 LAB — POCT I-STAT, CHEM 8
BUN: 17 mg/dL (ref 6–23)
Calcium, Ion: 1.33 mmol/L — ABNORMAL HIGH (ref 1.13–1.30)
Chloride: 108 mEq/L (ref 96–112)
Potassium: 4.2 mEq/L (ref 3.5–5.1)

## 2011-11-09 MED ORDER — IOHEXOL 300 MG/ML  SOLN
100.0000 mL | Freq: Once | INTRAMUSCULAR | Status: AC | PRN
  Administered 2011-11-09: 100 mL via INTRAVENOUS

## 2011-11-09 MED ORDER — TECHNETIUM TC 99M MEDRONATE IV KIT
25.0000 | PACK | Freq: Once | INTRAVENOUS | Status: AC | PRN
  Administered 2011-11-09: 25 via INTRAVENOUS

## 2011-11-10 ENCOUNTER — Other Ambulatory Visit (HOSPITAL_COMMUNITY): Payer: Medicare Other

## 2011-11-10 NOTE — Progress Notes (Signed)
Blood sample obtained from left arm IV for Creatnine level.  

## 2011-11-20 LAB — COMPREHENSIVE METABOLIC PANEL
ALT: 25 U/L (ref 10–40)
AST: 25 U/L
Albumin: 4.1
CO2: 25 mmol/L
Calcium: 10.4 mg/dL
Chloride: 109 mmol/L
Creat: 1.1
Potassium: 4.5 mmol/L
Total Protein: 6.2 g/dL

## 2011-11-20 LAB — CBC
WBC: 9.2
platelet count: 148

## 2011-11-24 ENCOUNTER — Ambulatory Visit (INDEPENDENT_AMBULATORY_CARE_PROVIDER_SITE_OTHER): Payer: Medicare Other | Admitting: Urology

## 2011-11-24 DIAGNOSIS — C61 Malignant neoplasm of prostate: Secondary | ICD-10-CM

## 2011-11-27 ENCOUNTER — Encounter: Payer: Self-pay | Admitting: Internal Medicine

## 2011-11-30 ENCOUNTER — Encounter: Payer: Self-pay | Admitting: Urgent Care

## 2011-11-30 ENCOUNTER — Ambulatory Visit (INDEPENDENT_AMBULATORY_CARE_PROVIDER_SITE_OTHER): Payer: Medicare Other | Admitting: Urgent Care

## 2011-11-30 VITALS — BP 106/67 | HR 81 | Temp 97.4°F | Ht 69.0 in | Wt 211.8 lb

## 2011-11-30 DIAGNOSIS — K863 Pseudocyst of pancreas: Secondary | ICD-10-CM

## 2011-11-30 DIAGNOSIS — K862 Cyst of pancreas: Secondary | ICD-10-CM | POA: Insufficient documentation

## 2011-11-30 NOTE — Patient Instructions (Addendum)
We will call you to discuss further imaging of your pancreas after I discuss with Dr Jena Gauss

## 2011-11-30 NOTE — Assessment & Plan Note (Signed)
[name] is a pleasant 78 y.o. male incidentally found to have a small pancreatic tail lesion on CT suggestive of pancreatic cyst.  Cannot r/o malignancy at this point.  Will discuss further imaging with Dr Jena Gauss.  May need EUS.

## 2011-11-30 NOTE — Progress Notes (Signed)
Referring Provider: Karleen Hampshire, MD Primary Care Physician:  Kirk Ruths, MD Primary Gastroenterologist:  Dr. Jena Gauss  Chief Complaint  Patient presents with  . Advice Only    Pancreatic cyst    HPI:  [name] is a 78 y.o. male here as a referral from Dr. Janifer Adie for pancreatic lesion seen on CT.  [name] [name] is being followed by Dr Janifer Adie for a newly diagnosed prostate cancer.  He had a CT A/P with IV contrast on 11/09/11 & was found to have a pancreatic tail lesion measuring 1.5cm x 1.0cm x 0.7cm.  He also had multiple benign renal cysts & intrarenal calculi & mild prostatic enlargement.   Denies abdominal pain, heartburn, indigestion, nausea, vomiting, dysphagia, odynophagia or anorexia.   Denies constipation, diarrhea, rectal bleeding, melena or weight loss.  He c/o gout, chronic back pain & foot pain.  In hind sight, he did notice some loose stools yesterday followed by two this AM.  +incontinence at times.  Last colonoscopy 07/18/03 showed internal hemorrhoids & left-sided diverticulosis.  He has been on a gout-healthy diet and maybe lost a pounds since he started.  He has an appt at [facility] in [place] on Friday @ 7a for prostate cancer to discuss management.    Past Medical History  Diagnosis Date  . Hypertension   . Gout   . High cholesterol   . DVT (deep venous thrombosis), left 11/2010    LLE; S/P hip OR  . Borderline diabetes   . Stroke 08/2009; 07/2010  . H/O heat stroke 08/2009    /E-chart  . Transient ischemic attack (TIA) 07/2010    /E-chart  . Arthritis   . Prostate cancer 2013    adenocarcinoma PSA 9.45  . Diabetes mellitus without complication   . Renal cyst     Past Surgical History  Procedure Date  . Hip arthroplasty 12/08/2010    Procedure: ARTHROPLASTY BIPOLAR HIP;  Surgeon: [name];  Location: [facility] OR;  Service: Orthopedics;  Laterality: Left;  . Corneal transplant     bilaterally "5 one left since 1984; 4 on  right"  . I&d extremity 05/28/2011    Procedure: IRRIGATION AND DEBRIDEMENT EXTREMITY;  Surgeon: Sharma Covert, MD;  Location: [facility] OR;  Service: Orthopedics;  Laterality: Right;  I & D Right hand/wrist  . Colonoscopy  07/18/2003    RMR: Rectum internal hemorrhoids.  Otherwise, normal rectum/ Left-sided diverticula.  Remainder of colonic mucosa appeared normal.    Current Outpatient Prescriptions  Medication Sig Dispense Refill  . allopurinol (ZYLOPRIM) 300 MG tablet Take 300 mg by mouth daily.      Marland Kitchen amLODipine (NORVASC) 5 MG tablet Take 2.5 mg by mouth every morning.       Marland Kitchen ascorbic acid (VITAMIN C) 500 MG tablet Take 500 mg by mouth every morning.       . calcium-vitamin D (OSCAL WITH D) 500-200 MG-UNIT per tablet Take 1 tablet by mouth every morning.       . COD LIVER OIL PO Take by mouth daily.      . colchicine 0.6 MG tablet Take 0.6 mg by mouth 2 (two) times daily as needed. For gout      . enalapril (VASOTEC) 5 MG tablet Take 2.5 mg by mouth every morning.       Marland Kitchen glipiZIDE (GLUCOTROL XL) 2.5 MG 24 hr tablet Take 2.5 mg by mouth daily.      . metoprolol (LOPRESSOR) 50  MG tablet Take 50 mg by mouth 2 (two) times daily.        . Multiple Vitamin (MULITIVITAMIN WITH MINERALS) TABS Take 1 tablet by mouth every morning.       . predniSONE (DELTASONE) 10 MG tablet Take 5 mg by mouth every other day.       . simvastatin (ZOCOR) 20 MG tablet Take 20 mg by mouth at bedtime.       . vitamin E 400 UNIT capsule Take 400 Units by mouth every morning.       . warfarin (COUMADIN) 1 MG tablet Take 4 mg by mouth daily. Take 4 tablets by mouth once daily      . alendronate (FOSAMAX) 70 MG tablet Take 70 mg by mouth every 7 (seven) days.         Allergies as of 11/30/2011 - Review Complete 11/30/2011  Allergen Reaction Noted  . Tetanus toxoids Swelling 08/10/2010    Family History  Problem Relation Age of Onset  . Prostate cancer Brother   . Colon cancer Sister 80    History   Social  History  . Marital Status: Married    Spouse Name: N/A    Number of Children: 0  . Years of Education: N/A   Occupational History  . Bank retired    Social History Main Topics  . Smoking status: Former Smoker    Types: Cigarettes, Cigars    Quit date: 01/27/1948  . Smokeless tobacco: Current User    Types: Chew     Comment: "quit smoking cigarettes age 15 or 3"  . Alcohol Use: No  . Drug Use: No  . Sexually Active: No   Other Topics Concern  . Not on file   Social History Narrative   Lives w/ wife    Review of Systems: Gen: Denies any fever, chills, sweats, anorexia, fatigue, weakness, malaise, weight loss, and sleep disorder CV: Denies chest pain, angina, palpitations, syncope, orthopnea, PND, peripheral edema, and claudication. Resp: Denies dyspnea at rest, dyspnea with exercise, cough, sputum, wheezing, coughing up blood, and pleurisy. GI: Denies vomiting blood, jaundice.   GU : see HPI MS: c/o hip, knee, back pain.  Gouty arthritic pains. Derm: Denies rash, itching, dry skin, hives, moles, warts, or unhealing ulcers.  Psych: Denies depression, anxiety, memory loss, suicidal ideation, hallucinations, paranoia, and confusion. Heme: Denies bruising, bleeding, and enlarged lymph nodes. Neuro:  Denies any headaches, dizziness, paresthesias. Endo:  Denies any problems with DM, thyroid, adrenal function.  Physical Exam: BP 106/67  Pulse 81  Temp 97.4 F (36.3 C) (Temporal)  Ht 5\' 9"  (1.753 m)  Wt 211 lb 12.8 oz (96.072 kg)  BMI 31.28 kg/m2 No LMP for male patient. General:   Alert,  Well-developed, obese, elderly male who is pleasant and cooperative in NAD.  He ambulates with a walker. Head:  Normocephalic and atraumatic. Eyes:  Sclera clear, no icterus.   Conjunctiva pink. Ears:  Normal auditory acuity. Nose:  No deformity, discharge, or lesions. Mouth:  No deformity or lesions,oropharynx pink & moist. Neck:  Supple; no masses or thyromegaly. Lungs:  Clear  throughout to auscultation.   No wheezes, crackles, or rhonchi. No acute distress. Heart:  Regular rate and rhythm; no murmurs, clicks, rubs,  or gallops. Abdomen: Protuberant. Normal bowel sounds.  No bruits.  Soft, non-tender and non-distended without masses, hepatosplenomegaly or hernias noted.  No guarding or rebound tenderness.   Rectal:  Deferred. Msk:  Symmetrical. Pulses:  Normal pulses noted.  Extremities:  No edema. Neurologic:  Alert and oriented x4;  grossly normal neurologically. Skin:  Intact without significant lesions or rashes. Lymph Nodes:  No significant cervical adenopathy. Psych:  Alert and cooperative. Normal mood and affect.

## 2011-12-01 ENCOUNTER — Other Ambulatory Visit: Payer: Self-pay | Admitting: Urgent Care

## 2011-12-01 ENCOUNTER — Other Ambulatory Visit: Payer: Self-pay

## 2011-12-01 DIAGNOSIS — K869 Disease of pancreas, unspecified: Secondary | ICD-10-CM

## 2011-12-01 DIAGNOSIS — Z139 Encounter for screening, unspecified: Secondary | ICD-10-CM

## 2011-12-01 NOTE — Progress Notes (Signed)
Faxed to PCP

## 2011-12-01 NOTE — Progress Notes (Signed)
Patient is scheduled for MRI on Thurs Nov 7th at 5:00 and he will go to Lab to have Creatinine drawn tomorrow

## 2011-12-01 NOTE — Progress Notes (Signed)
Discussed w/ Dr Jena Gauss. Proceed w/ MRI pancreas Spoke w/ Kathlene November in MRI-hip replacement should not be contraindication. Please arrange. Thanks

## 2011-12-02 LAB — CREATININE, SERUM: Creat: 0.98 mg/dL (ref 0.50–1.35)

## 2011-12-03 ENCOUNTER — Ambulatory Visit (HOSPITAL_COMMUNITY)
Admission: RE | Admit: 2011-12-03 | Discharge: 2011-12-03 | Disposition: A | Discharge status: Home / Self Care | Payer: Medicare Other | Source: Ambulatory Visit | Attending: Urgent Care | Admitting: Urgent Care

## 2011-12-03 DIAGNOSIS — Z8546 Personal history of malignant neoplasm of prostate: Secondary | ICD-10-CM | POA: Insufficient documentation

## 2011-12-03 DIAGNOSIS — K869 Disease of pancreas, unspecified: Secondary | ICD-10-CM | POA: Insufficient documentation

## 2011-12-03 MED ORDER — GADOBENATE DIMEGLUMINE 529 MG/ML IV SOLN
20.0000 mL | Freq: Once | INTRAVENOUS | Status: AC | PRN
  Administered 2011-12-03: 20 mL via INTRAVENOUS

## 2011-12-03 NOTE — Progress Notes (Signed)
Faxed to PCP

## 2011-12-03 NOTE — Progress Notes (Signed)
Quick Note:  For MRI WU:[id number],[email or web address] M, MD  ______

## 2011-12-04 NOTE — Progress Notes (Signed)
Quick Note:  Results given to pt. C/o low back pain & gouty leg pain, Advised to follow up w/ Kirk Ruths, MD  Please make reminder pt needs OV to set up MRI pancreas for FU cyst 11/2012  Thanks BJ:[email or web address] M, MD  ______

## 2011-12-12 ENCOUNTER — Encounter (HOSPITAL_COMMUNITY): Payer: Self-pay | Admitting: *Deleted

## 2011-12-12 ENCOUNTER — Emergency Department (HOSPITAL_COMMUNITY): Payer: Medicare Other

## 2011-12-12 ENCOUNTER — Emergency Department (HOSPITAL_COMMUNITY)
Admission: EM | Admit: 2011-12-12 | Discharge: 2011-12-13 | Disposition: A | Discharge status: Home / Self Care | Payer: Medicare Other | Attending: Emergency Medicine | Admitting: Emergency Medicine

## 2011-12-12 DIAGNOSIS — Z87891 Personal history of nicotine dependence: Secondary | ICD-10-CM | POA: Insufficient documentation

## 2011-12-12 DIAGNOSIS — D689 Coagulation defect, unspecified: Secondary | ICD-10-CM | POA: Insufficient documentation

## 2011-12-12 DIAGNOSIS — T45515A Adverse effect of anticoagulants, initial encounter: Secondary | ICD-10-CM

## 2011-12-12 DIAGNOSIS — I10 Essential (primary) hypertension: Secondary | ICD-10-CM | POA: Insufficient documentation

## 2011-12-12 DIAGNOSIS — Z87448 Personal history of other diseases of urinary system: Secondary | ICD-10-CM | POA: Insufficient documentation

## 2011-12-12 DIAGNOSIS — Z7901 Long term (current) use of anticoagulants: Secondary | ICD-10-CM | POA: Insufficient documentation

## 2011-12-12 DIAGNOSIS — Z8673 Personal history of transient ischemic attack (TIA), and cerebral infarction without residual deficits: Secondary | ICD-10-CM | POA: Insufficient documentation

## 2011-12-12 DIAGNOSIS — M79605 Pain in left leg: Secondary | ICD-10-CM

## 2011-12-12 DIAGNOSIS — Z79899 Other long term (current) drug therapy: Secondary | ICD-10-CM | POA: Insufficient documentation

## 2011-12-12 DIAGNOSIS — F17200 Nicotine dependence, unspecified, uncomplicated: Secondary | ICD-10-CM | POA: Insufficient documentation

## 2011-12-12 DIAGNOSIS — E119 Type 2 diabetes mellitus without complications: Secondary | ICD-10-CM | POA: Insufficient documentation

## 2011-12-12 DIAGNOSIS — Z86718 Personal history of other venous thrombosis and embolism: Secondary | ICD-10-CM | POA: Insufficient documentation

## 2011-12-12 DIAGNOSIS — M25569 Pain in unspecified knee: Secondary | ICD-10-CM | POA: Insufficient documentation

## 2011-12-12 DIAGNOSIS — E7800 Pure hypercholesterolemia, unspecified: Secondary | ICD-10-CM | POA: Insufficient documentation

## 2011-12-12 DIAGNOSIS — M129 Arthropathy, unspecified: Secondary | ICD-10-CM | POA: Insufficient documentation

## 2011-12-12 MED ORDER — FENTANYL CITRATE 0.05 MG/ML IJ SOLN
INTRAMUSCULAR | Status: AC
  Administered 2011-12-12: 50 ug
  Filled 2011-12-12: qty 2

## 2011-12-12 MED ORDER — FENTANYL CITRATE 0.05 MG/ML IJ SOLN
50.0000 ug | Freq: Once | INTRAMUSCULAR | Status: AC
  Administered 2011-12-12: 50 ug via INTRAVENOUS

## 2011-12-12 MED ORDER — HYDROMORPHONE HCL PF 1 MG/ML IJ SOLN
INTRAMUSCULAR | Status: AC
  Filled 2011-12-12: qty 1

## 2011-12-12 MED ORDER — FENTANYL CITRATE 0.05 MG/ML IJ SOLN
50.0000 ug | Freq: Once | INTRAMUSCULAR | Status: AC
  Filled 2011-12-12: qty 2

## 2011-12-12 MED ORDER — HYDROMORPHONE HCL PF 1 MG/ML IJ SOLN
1.0000 mg | Freq: Once | INTRAMUSCULAR | Status: AC
  Administered 2011-12-12: 1 mg via INTRAVENOUS

## 2011-12-12 NOTE — ED Notes (Addendum)
Pt placed on 2L of oxygen for comfort 

## 2011-12-12 NOTE — ED Notes (Signed)
edpa in with pt 

## 2011-12-12 NOTE — ED Notes (Signed)
Pt still moaning out in pain.

## 2011-12-12 NOTE — ED Notes (Signed)
pts friend is in room, pts friend was seen and confronted about getting pts hydrocodone out of his bag. pts friend made aware not to give pt any hydrocodone.

## 2011-12-12 NOTE — ED Notes (Signed)
Patient transported to X-ray 

## 2011-12-12 NOTE — ED Notes (Addendum)
Pt states he turned his leg and his left knee popped. Pt acting like he is in severe pain. Pt c/o lower left sided back pain radiating to left knee. According to his friend, he just found out he has prostate cancer. Friend is scared cancer may be in his bones.

## 2011-12-12 NOTE — ED Notes (Signed)
Pt stating  "it's gonna bust, it's gonna bust."

## 2011-12-13 MED ORDER — HYDROMORPHONE HCL PF 1 MG/ML IJ SOLN
1.0000 mg | Freq: Once | INTRAMUSCULAR | Status: AC
  Administered 2011-12-13: 1 mg via INTRAVENOUS
  Filled 2011-12-13: qty 1

## 2011-12-13 MED ORDER — OXYCODONE-ACETAMINOPHEN 5-325 MG PO TABS: 1.0000 | ORAL_TABLET | ORAL | Status: AC | PRN

## 2011-12-13 NOTE — ED Notes (Signed)
Pt resting.

## 2011-12-13 NOTE — ED Notes (Signed)
Pt still in x-ray

## 2011-12-13 NOTE — ED Provider Notes (Signed)
0123 Assumed care/disposition of patient that presented with sudden onset of sharp pain to left knee with radiation of pain to his hip and ankle. Xrays show only tricompartmental arthritis. Placed in knee immobilizer, given analgesics. Patient is on Coumadin for PE. Awaiting INR.Recent increase in coumadin on Saturdays and Sundays. 1610 Patient now comfortable. Analgesic has relieved pain. INR has returned 3.83. Reviewed with patient plan to hold coumadin Sunday. Resume regular dose on Monday. He is to continue to the regular doses with the exception of only increasing his Saturday dose. He is to have the INR checked again in a week . He voiced understanding. His brother who is at the bedside, also verbalized understanding.   Nicoletta Dress. Colon Branch, MD 12/13/11 [id number]

## 2011-12-13 NOTE — ED Provider Notes (Signed)
History     CSN: [id number]  Arrival date & time 12/12/11  2215   First MD Initiated Contact with Patient 12/12/11 2258      Chief Complaint  Patient presents with  . Knee Pain    (Consider location/radiation/quality/duration/timing/severity/associated sxs/prior treatment) HPI Comments: [name] presents with sudden onset of severe pain in his right knee which he states "popped" as he was ambulating to the bathroom prior to arrival.  Since this occurred,  Pain has spread and now involves his entire left lower extremity,  Stating he hurts in his left hip and his left ankle as well.  He states his foot feels like "it is going to bust"  It feels so tight.  His brother at the bedside states he has other similar episodes of severe pain involving his lower extremities despite injury,  But he was recently diagnosed with prostate cancer and is concerned for possible cancer in his bones.  He also has a history of gout and states he is no stranger to pain.  He takes colcrys currently and is also taking prednisone for his chronic gout.   The history is provided by the patient and a relative.    Past Medical History  Diagnosis Date  . Hypertension   . Gout   . High cholesterol   . DVT (deep venous thrombosis), left 11/2010    LLE; S/P hip OR  . Borderline diabetes   . Stroke 08/2009; 07/2010  . H/O heat stroke 08/2009    /E-chart  . Transient ischemic attack (TIA) 07/2010    /E-chart  . Arthritis   . Prostate cancer 2013    adenocarcinoma PSA 9.45  . Diabetes mellitus without complication   . Renal cyst     Past Surgical History  Procedure Date  . Hip arthroplasty 12/08/2010    Procedure: ARTHROPLASTY BIPOLAR HIP;  Surgeon: [name];  Location: [facility] OR;  Service: Orthopedics;  Laterality: Left;  . Corneal transplant     bilaterally "5 one left since 1984; 4 on right"  . I&d extremity 05/28/2011    Procedure: IRRIGATION AND DEBRIDEMENT EXTREMITY;  Surgeon: Sharma Covert, MD;   Location: [facility] OR;  Service: Orthopedics;  Laterality: Right;  I & D Right hand/wrist  . Colonoscopy  07/18/2003    RMR: Rectum internal hemorrhoids.  Otherwise, normal rectum/ Left-sided diverticula.  Remainder of colonic mucosa appeared normal.    Family History  Problem Relation Age of Onset  . Prostate cancer Brother   . Colon cancer Sister 31    History  Substance Use Topics  . Smoking status: Former Smoker    Types: Cigarettes, Cigars    Quit date: 01/27/1948  . Smokeless tobacco: Current User    Types: Chew     Comment: "quit smoking cigarettes age 57 or 36"  . Alcohol Use: No      Review of Systems  Constitutional: Negative for fever.  Musculoskeletal: Positive for back pain and arthralgias. Negative for joint swelling.  Skin: Negative for wound.  Neurological: Negative for weakness and numbness.    Allergies  Tetanus toxoids  Home Medications   Current Outpatient Rx  Name  Route  Sig  Dispense  Refill  . ALENDRONATE SODIUM 70 MG PO TABS   Oral   Take 70 mg by mouth every 7 (seven) days.          . ALLOPURINOL 300 MG PO TABS   Oral   Take 300 mg by mouth  daily.         Marland Kitchen AMLODIPINE BESYLATE 5 MG PO TABS   Oral   Take 5 mg by mouth daily.          . ASCORBIC ACID 500 MG PO TABS   Oral   Take 500 mg by mouth every morning.          Marland Kitchen CALCIUM CARBONATE-VITAMIN D 500-200 MG-UNIT PO TABS   Oral   Take 1 tablet by mouth every morning.          . COD LIVER OIL PO   Oral   Take by mouth daily.         . COLCHICINE 0.6 MG PO TABS   Oral   Take 0.6 mg by mouth 2 (two) times daily as needed. For gout         . ENALAPRIL MALEATE 5 MG PO TABS   Oral   Take 2.5 mg by mouth every morning.          Marland Kitchen GLIPIZIDE ER 2.5 MG PO TB24   Oral   Take 2.5 mg by mouth daily.         Marland Kitchen HYDROCODONE-ACETAMINOPHEN 5-500 MG PO TABS   Oral   Take 1 tablet by mouth 3 (three) times daily as needed. pain         . METOPROLOL TARTRATE 50 MG PO TABS    Oral   Take 50 mg by mouth 2 (two) times daily.           . ADULT MULTIVITAMIN W/MINERALS CH   Oral   Take 1 tablet by mouth every morning.          Marland Kitchen PREDNISONE 10 MG PO TABS   Oral   Take 5 mg by mouth every other day.          Marland Kitchen SIMVASTATIN 20 MG PO TABS   Oral   Take 20 mg by mouth at bedtime.          Marland Kitchen VITAMIN E 400 UNITS PO CAPS   Oral   Take 400 Units by mouth every morning.          . WARFARIN SODIUM 1 MG PO TABS   Oral   Take 4-6 mg by mouth daily. Take 4 tablets by mouth daily Monday-Friday and 6 tablets Saturday and Sunday           BP 151/82  Pulse 70  Temp 97.6 F (36.4 C) (Oral)  Resp 24  SpO2 98%  Physical Exam  Constitutional: He appears well-developed and well-nourished. He appears distressed.       Patient is actively writhing and wincing in pain,  Hyperventilating intermittently with increased pain.  Gives abbreviated history which is filled in by brother at bedside.  HENT:  Head: Atraumatic.  Neck: Normal range of motion.  Cardiovascular:       Pulses equal bilaterally  Musculoskeletal: He exhibits tenderness.       Patient with severe ttp to even light touch medial left knee,  Left ankle and left lateral left hip at greater trochanter.  No rash,  Swelling,  Ecchymosis or joint effusion noted,  No palpable muscle spasms.  No erythema or rash.  Dorsalis pedis pulses equal bilateral.    Neurological: He is alert. He has normal strength. He displays normal reflexes. No sensory deficit.       Equal strength  Skin: Skin is warm and dry.  Psychiatric: He has a normal mood and  affect.    ED Course  Procedures (including critical care time)   Labs Reviewed  PROTIME-INR   Dg Hip Complete Left  12/13/2011  *RADIOLOGY REPORT*  Clinical Data: Left knee popped, hip pain.  LEFT HIP - COMPLETE 2+ VIEW  Comparison: 12/07/2010.  Findings: Since the prior hip radiographs, the patient has undergone left hip hemiarthroplasty.  There is no  evidence for periprosthetic fracture or dislocation.  Regional bones of the pelvis are intact.  IMPRESSION: No acute abnormality.  Satisfactory left hip hemiarthroplasty.   Original Report Authenticated By: Davonna Belling, M.D.    Dg Ankle Complete Left  12/13/2011  *RADIOLOGY REPORT*  Clinical Data: Left lower extremity pain.  LEFT ANKLE COMPLETE - 3+ VIEW  Comparison: None.  Findings:   There is no evidence of fracture or dislocation.  There is no evidence of arthropathy or other focal bony abnormality. Soft tissues are unremarkable. Moderate vascular calcification.  IMPRESSION:  No acute findings.   Original Report Authenticated By: Davonna Belling, M.D.    Dg Knee Complete 4 Views Left  12/13/2011  *RADIOLOGY REPORT*  Clinical Data: Pain pain.  Left knee popping.  LEFT KNEE - COMPLETE 4+ VIEW  Comparison: 12/07/2010.  Findings: Tricompartmental degenerative changes are present.  There is no fracture or dislocation.  No effusion is seen.  Advanced vascular calcification is noted.  Similar appearance to 2012.  IMPRESSION: No fracture or effusion.   Original Report Authenticated By: Davonna Belling, M.D.      No diagnosis found.    MDM  Xray results discussed with patient and brother.  Pt was given fentanyl 50 mcg x 2 per nursing protocol which did not relieve pain.  He was given dilaudid 1 mg IV which improved pain briefly, barely long enough to get through xrays.  Repeated x 1 once back in ed.  INR ordered as pt is on coumadin.  Discussed with Dr Colon Branch who will dispo patient.        Burgess Amor, [place] 12/13/11 [phone]

## 2011-12-13 NOTE — ED Provider Notes (Signed)
Medical screening examination/treatment/procedure(s) were conducted as a shared visit with non-physician practitioner(s) and myself.  I personally evaluated the patient during the encounter  [name]. Colon Branch, MD 12/13/11 [phone]

## 2012-01-02 ENCOUNTER — Emergency Department (HOSPITAL_COMMUNITY): Payer: Medicare Other

## 2012-01-02 ENCOUNTER — Inpatient Hospital Stay (HOSPITAL_COMMUNITY): Payer: Medicare Other

## 2012-01-02 ENCOUNTER — Encounter (HOSPITAL_COMMUNITY): Payer: Self-pay | Admitting: *Deleted

## 2012-01-02 ENCOUNTER — Inpatient Hospital Stay (HOSPITAL_COMMUNITY)
Admission: EM | Admit: 2012-01-02 | Discharge: 2012-01-04 | DRG: 093 | Disposition: A | Discharge status: Home / Self Care | Payer: Medicare Other | Attending: Internal Medicine | Admitting: Internal Medicine

## 2012-01-02 DIAGNOSIS — T50995A Adverse effect of other drugs, medicaments and biological substances, initial encounter: Secondary | ICD-10-CM | POA: Diagnosis present

## 2012-01-02 DIAGNOSIS — E7800 Pure hypercholesterolemia, unspecified: Secondary | ICD-10-CM

## 2012-01-02 DIAGNOSIS — Z8673 Personal history of transient ischemic attack (TIA), and cerebral infarction without residual deficits: Secondary | ICD-10-CM

## 2012-01-02 DIAGNOSIS — M129 Arthropathy, unspecified: Secondary | ICD-10-CM | POA: Diagnosis present

## 2012-01-02 DIAGNOSIS — K862 Cyst of pancreas: Secondary | ICD-10-CM

## 2012-01-02 DIAGNOSIS — Z86718 Personal history of other venous thrombosis and embolism: Secondary | ICD-10-CM

## 2012-01-02 DIAGNOSIS — Z8546 Personal history of malignant neoplasm of prostate: Secondary | ICD-10-CM

## 2012-01-02 DIAGNOSIS — I10 Essential (primary) hypertension: Secondary | ICD-10-CM | POA: Diagnosis present

## 2012-01-02 DIAGNOSIS — E785 Hyperlipidemia, unspecified: Secondary | ICD-10-CM | POA: Diagnosis present

## 2012-01-02 DIAGNOSIS — R21 Rash and other nonspecific skin eruption: Secondary | ICD-10-CM

## 2012-01-02 DIAGNOSIS — Z87891 Personal history of nicotine dependence: Secondary | ICD-10-CM

## 2012-01-02 DIAGNOSIS — G92 Toxic encephalopathy: Principal | ICD-10-CM | POA: Diagnosis present

## 2012-01-02 DIAGNOSIS — Z79899 Other long term (current) drug therapy: Secondary | ICD-10-CM

## 2012-01-02 DIAGNOSIS — M25579 Pain in unspecified ankle and joints of unspecified foot: Secondary | ICD-10-CM | POA: Diagnosis present

## 2012-01-02 DIAGNOSIS — L03119 Cellulitis of unspecified part of limb: Secondary | ICD-10-CM

## 2012-01-02 DIAGNOSIS — R4182 Altered mental status, unspecified: Secondary | ICD-10-CM

## 2012-01-02 DIAGNOSIS — M2550 Pain in unspecified joint: Secondary | ICD-10-CM | POA: Diagnosis present

## 2012-01-02 DIAGNOSIS — IMO0001 Reserved for inherently not codable concepts without codable children: Secondary | ICD-10-CM | POA: Diagnosis present

## 2012-01-02 DIAGNOSIS — Z7982 Long term (current) use of aspirin: Secondary | ICD-10-CM

## 2012-01-02 DIAGNOSIS — E119 Type 2 diabetes mellitus without complications: Secondary | ICD-10-CM | POA: Diagnosis present

## 2012-01-02 DIAGNOSIS — G929 Unspecified toxic encephalopathy: Principal | ICD-10-CM | POA: Diagnosis present

## 2012-01-02 DIAGNOSIS — M109 Gout, unspecified: Secondary | ICD-10-CM | POA: Diagnosis present

## 2012-01-02 LAB — CREATININE, SERUM: GFR calc Af Amer: 80 mL/min — ABNORMAL LOW (ref >90)

## 2012-01-02 LAB — COMPREHENSIVE METABOLIC PANEL
Albumin: 3.4 g/dL — ABNORMAL LOW (ref 3.5–5.2)
Alkaline Phosphatase: 90 U/L (ref 39–117)
BUN: 17 mg/dL (ref 6–23)
Potassium: 4.1 mEq/L (ref 3.5–5.1)
Sodium: 138 mEq/L (ref 135–145)
Total Protein: 6.5 g/dL (ref 6.0–8.3)

## 2012-01-02 LAB — CBC WITH DIFFERENTIAL/PLATELET
Basophils Relative: 1 % (ref 0–1)
Eosinophils Absolute: 0.3 10*3/uL (ref 0.0–0.7)
MCH: 32.8 pg (ref 26.0–34.0)
MCHC: 34 g/dL (ref 30.0–36.0)
Monocytes Relative: 16 % — ABNORMAL HIGH (ref 3–12)
Neutrophils Relative %: 50 % (ref 43–77)
Platelets: 167 10*3/uL (ref 150–400)

## 2012-01-02 LAB — URINALYSIS, ROUTINE W REFLEX MICROSCOPIC
Glucose, UA: NEGATIVE mg/dL
Ketones, ur: 40 mg/dL — AB
Leukocytes, UA: NEGATIVE
Specific Gravity, Urine: 1.015 (ref 1.005–1.030)
pH: 5.5 (ref 5.0–8.0)

## 2012-01-02 LAB — CBC
MCH: 32.6 pg (ref 26.0–34.0)
MCHC: 33.7 g/dL (ref 30.0–36.0)
MCV: 96.9 fL (ref 78.0–100.0)
Platelets: 161 10*3/uL (ref 150–400)
RDW: 13.8 % (ref 11.5–15.5)
WBC: 6.4 10*3/uL (ref 4.0–10.5)

## 2012-01-02 LAB — GLUCOSE, CAPILLARY

## 2012-01-02 MED ORDER — PREDNISONE 10 MG PO TABS
10.0000 mg | ORAL_TABLET | Freq: Every day | ORAL | Status: DC
  Filled 2012-01-02: qty 1

## 2012-01-02 MED ORDER — METOPROLOL TARTRATE 50 MG PO TABS
50.0000 mg | ORAL_TABLET | Freq: Two times a day (BID) | ORAL | Status: DC
  Administered 2012-01-02 – 2012-01-04 (×4): 50 mg via ORAL
  Filled 2012-01-02 (×6): qty 1

## 2012-01-02 MED ORDER — ONDANSETRON HCL 4 MG/2ML IJ SOLN
4.0000 mg | Freq: Three times a day (TID) | INTRAMUSCULAR | Status: AC | PRN
  Filled 2012-01-02: qty 2

## 2012-01-02 MED ORDER — SIMVASTATIN 20 MG PO TABS
20.0000 mg | ORAL_TABLET | Freq: Every day | ORAL | Status: DC
  Administered 2012-01-02 – 2012-01-03 (×2): 20 mg via ORAL
  Filled 2012-01-02 (×3): qty 1

## 2012-01-02 MED ORDER — ACETAMINOPHEN 325 MG PO TABS: 650.0000 mg | ORAL_TABLET | Freq: Four times a day (QID) | ORAL | Status: DC | PRN

## 2012-01-02 MED ORDER — FENTANYL CITRATE 0.05 MG/ML IJ SOLN
25.0000 ug | Freq: Once | INTRAMUSCULAR | Status: AC
  Administered 2012-01-02: 25 ug via INTRAVENOUS
  Filled 2012-01-02: qty 2

## 2012-01-02 MED ORDER — PREDNISONE 50 MG PO TABS
50.0000 mg | ORAL_TABLET | Freq: Once | ORAL | Status: AC
  Administered 2012-01-02: 50 mg via ORAL
  Filled 2012-01-02: qty 1

## 2012-01-02 MED ORDER — PREDNISONE 50 MG PO TABS
50.0000 mg | ORAL_TABLET | Freq: Every day | ORAL | Status: DC
  Filled 2012-01-02 (×2): qty 1

## 2012-01-02 MED ORDER — ACETAMINOPHEN 650 MG RE SUPP: 650.0000 mg | Freq: Four times a day (QID) | RECTAL | Status: DC | PRN

## 2012-01-02 MED ORDER — HYDROCODONE-ACETAMINOPHEN 5-325 MG PO TABS
1.0000 | ORAL_TABLET | Freq: Four times a day (QID) | ORAL | Status: DC | PRN
  Administered 2012-01-02 (×2): 1 via ORAL
  Filled 2012-01-02 (×2): qty 1

## 2012-01-02 MED ORDER — AMLODIPINE BESYLATE 5 MG PO TABS
5.0000 mg | ORAL_TABLET | Freq: Every day | ORAL | Status: DC
  Administered 2012-01-03 – 2012-01-04 (×2): 5 mg via ORAL
  Filled 2012-01-02 (×3): qty 1

## 2012-01-02 MED ORDER — PANTOPRAZOLE SODIUM 40 MG PO TBEC
40.0000 mg | DELAYED_RELEASE_TABLET | Freq: Every day | ORAL | Status: DC
  Administered 2012-01-02 – 2012-01-04 (×3): 40 mg via ORAL
  Filled 2012-01-02 (×3): qty 1

## 2012-01-02 MED ORDER — ASPIRIN EC 81 MG PO TBEC
81.0000 mg | DELAYED_RELEASE_TABLET | Freq: Every day | ORAL | Status: DC
  Administered 2012-01-03 – 2012-01-04 (×2): 81 mg via ORAL
  Filled 2012-01-02 (×3): qty 1

## 2012-01-02 MED ORDER — ENOXAPARIN SODIUM 30 MG/0.3ML ~~LOC~~ SOLN
30.0000 mg | SUBCUTANEOUS | Status: DC
  Administered 2012-01-02 – 2012-01-03 (×2): 30 mg via SUBCUTANEOUS
  Filled 2012-01-02 (×3): qty 0.3

## 2012-01-02 MED ORDER — SODIUM CHLORIDE 0.9 % IV SOLN
1000.0000 mL | INTRAVENOUS | Status: DC
  Administered 2012-01-02 – 2012-01-03 (×2): 1000 mL via INTRAVENOUS

## 2012-01-02 MED ORDER — NAPROXEN 375 MG PO TABS
375.0000 mg | ORAL_TABLET | Freq: Two times a day (BID) | ORAL | Status: DC
  Administered 2012-01-02 – 2012-01-04 (×4): 375 mg via ORAL
  Filled 2012-01-02 (×6): qty 1

## 2012-01-02 MED ORDER — SODIUM CHLORIDE 0.9 % IV SOLN
1000.0000 mL | INTRAVENOUS | Status: DC
  Administered 2012-01-02: 1000 mL via INTRAVENOUS

## 2012-01-02 NOTE — H&P (Signed)
[facility]          History and Physical    PCP:   Kirk Ruths, MD   Chief Complaint:  Not acting right and severe pain  HPI: [name] is a 80/M with PMH of DM, HTN, gout, prostate cancer is brought to the ER by his brother today. Normally Mr.Kirchoff has aches and pains in his joints but is able to do all his ADLs, drives himself around and take care of things, for over 1 month he's been having pain in his L ankle and foot this was attributed to Gout, he was treated with a prednisone course by his PCP with only temporary improvement and then it worsened again. For the past 3 days, he had worsening of his ankle pain and severe pain shoulder, elbow, foot and most of his small and large joints to the extent that he is now unable to ambulate even with his walker. He has been taking narcotics as needed for pain. Apparently when his brother saw him today he was lethargic, speech was slurred, and was unable to ambulate, he had to help him to the car and brought him to ER, this mentation and speech has since improved, he denies taking excessive narcotics for his pain. He denies any focal weakness, no fevers, chills, N/V/D   Allergies:   Allergies  Allergen Reactions  . Tetanus Toxoids Swelling    Also allergic to "high powered pain medications."      Past Medical History  Diagnosis Date  . Hypertension   . Gout   . High cholesterol   . DVT (deep venous thrombosis), left 11/2010    LLE; S/P hip OR  . Borderline diabetes   . Stroke 08/2009; 07/2010  . H/O heat stroke 08/2009    /E-chart  . Transient ischemic attack (TIA) 07/2010    /E-chart  . Arthritis   . Prostate cancer 2013    adenocarcinoma PSA 9.45  . Diabetes mellitus without complication   . Renal cyst     Past Surgical History  Procedure Date  . Hip arthroplasty 12/08/2010    Procedure: ARTHROPLASTY BIPOLAR HIP;  Surgeon: [name];  Location: [facility] OR;  Service: Orthopedics;  Laterality: Left;   . Corneal transplant     bilaterally "5 one left since 1984; 4 on right"  . I&d extremity 05/28/2011    Procedure: IRRIGATION AND DEBRIDEMENT EXTREMITY;  Surgeon: Sharma Covert, MD;  Location: [facility] OR;  Service: Orthopedics;  Laterality: Right;  I & D Right hand/wrist  . Colonoscopy  07/18/2003    RMR: Rectum internal hemorrhoids.  Otherwise, normal rectum/ Left-sided diverticula.  Remainder of colonic mucosa appeared normal.    Prior to Admission medications   Medication Sig Start Date End Date Taking? Authorizing Provider  alendronate (FOSAMAX) 70 MG tablet Take 70 mg by mouth every 7 (seven) days.  11/02/11  Yes Historical Provider, MD  amLODipine (NORVASC) 5 MG tablet Take 5 mg by mouth daily.    Yes Historical Provider, MD  ascorbic acid (VITAMIN C) 500 MG tablet Take 500 mg by mouth every morning.    Yes Historical Provider, MD  aspirin EC 81 MG tablet Take 81 mg by mouth daily.   Yes Historical Provider, MD  calcium-vitamin D (OSCAL WITH D) 500-200 MG-UNIT per tablet Take 1 tablet by mouth every morning.    Yes Historical Provider, MD  COD LIVER OIL PO Take by mouth daily.   Yes Historical Provider, MD  enalapril (VASOTEC)  5 MG tablet Take 2.5 mg by mouth every morning.    Yes Historical Provider, MD  glipiZIDE (GLUCOTROL XL) 2.5 MG 24 hr tablet Take 2.5 mg by mouth daily.   Yes Historical Provider, MD  HYDROcodone-acetaminophen (VICODIN) 5-500 MG per tablet Take 1 tablet by mouth every 8 (eight) hours as needed. For pain 11/19/11  Yes Historical Provider, MD  metoprolol (LOPRESSOR) 50 MG tablet Take 50 mg by mouth 2 (two) times daily.     Yes Historical Provider, MD  Multiple Vitamin (MULITIVITAMIN WITH MINERALS) TABS Take 1 tablet by mouth every morning.    Yes Historical Provider, MD  oxyCODONE-acetaminophen (TYLOX) 5-500 MG per capsule Take 1 capsule by mouth every 4 (four) hours as needed. For pain   Yes Historical Provider, MD  simvastatin (ZOCOR) 20 MG tablet Take 20 mg by mouth at  bedtime.    Yes Historical Provider, MD  vitamin E 400 UNIT capsule Take 400 Units by mouth every morning.    Yes Historical Provider, MD    Social History: married, lives at home with wife  reports that he quit smoking about 63 years ago. His smoking use included Cigarettes and Cigars. His smokeless tobacco use includes Chew. He reports that he does not drink alcohol or use illicit drugs.  Family History  Problem Relation Age of Onset  . Prostate cancer Brother   . Colon cancer Sister 61    Review of Systems:  Constitutional: Denies fever, chills, diaphoresis, appetite change and fatigue.  HEENT: Denies photophobia, eye pain, redness, hearing loss, ear pain, congestion, sore throat, rhinorrhea, sneezing, mouth sores, trouble swallowing, neck pain, neck stiffness and tinnitus.   Respiratory: Denies SOB, DOE, cough, chest tightness,  and wheezing.   Cardiovascular: Denies chest pain, palpitations and leg swelling.  Gastrointestinal: Denies nausea, vomiting, abdominal pain, diarrhea, constipation, blood in stool and abdominal distention.  Genitourinary: Denies dysuria, urgency, frequency, hematuria, flank pain and difficulty urinating.  Musculoskeletal: Denies myalgias, back pain, joint swelling, arthralgias and gait problem.  Skin: Denies pallor, rash and wound.  Neurological: Denies dizziness, seizures, syncope, weakness, light-headedness, numbness and headaches.  Hematological: Denies adenopathy. Easy bruising, personal or family bleeding history  Psychiatric/Behavioral: Denies suicidal ideation, mood changes, confusion, nervousness, sleep disturbance and agitation   Physical Exam: Blood pressure 115/68, pulse 85, temperature 98.2 F (36.8 C), temperature source Oral, resp. rate 20, height 5\' 9"  (1.753 m), weight 89.5 kg (197 lb 5 oz), SpO2 95.00%. Gen: Alert, awake, oriented to self, place and to time HEENT: PERRLA, EOMI CVS: S1S2/RRR Lungs: CTAB Abd: soft, NT, BS present EXt: no  edema c/c MSK: tenderness and painful range of movement involving both knees, ankles, both shoulders, elbows  Labs on Admission:  Results for orders placed during the hospital encounter of 01/02/12 (from the past 48 hour(s))  GLUCOSE, CAPILLARY     Status: Abnormal   Collection Time   01/02/12 10:20 AM      Component Value Range Comment   Glucose-Capillary 137 (*) 70 - 99 mg/dL    Comment 1 Notify RN      Comment 2 Documented in Chart     CBC WITH DIFFERENTIAL     Status: Abnormal   Collection Time   01/02/12 10:38 AM      Component Value Range Comment   WBC 6.7  4.0 - 10.5 K/uL    RBC 4.11 (*) 4.22 - 5.81 MIL/uL    Hemoglobin 13.5  13.0 - 17.0 g/dL    HCT 39.7  39.0 - 52.0 %    MCV 96.6  78.0 - 100.0 fL    MCH 32.8  26.0 - 34.0 pg    MCHC 34.0  30.0 - 36.0 g/dL    RDW 47.8  29.5 - 62.1 %    Platelets 167  150 - 400 K/uL    Neutrophils Relative 50  43 - 77 %    Neutro Abs 3.3  1.7 - 7.7 K/uL    Lymphocytes Relative 28  12 - 46 %    Lymphs Abs 1.9  0.7 - 4.0 K/uL    Monocytes Relative 16 (*) 3 - 12 %    Monocytes Absolute 1.1 (*) 0.1 - 1.0 K/uL    Eosinophils Relative 5  0 - 5 %    Eosinophils Absolute 0.3  0.0 - 0.7 K/uL    Basophils Relative 1  0 - 1 %    Basophils Absolute 0.1  0.0 - 0.1 K/uL   COMPREHENSIVE METABOLIC PANEL     Status: Abnormal   Collection Time   01/02/12 10:38 AM      Component Value Range Comment   Sodium 138  135 - 145 mEq/L    Potassium 4.1  3.5 - 5.1 mEq/L    Chloride 103  96 - 112 mEq/L    CO2 22  19 - 32 mEq/L    Glucose, Bld 89  70 - 99 mg/dL    BUN 17  6 - 23 mg/dL    Creatinine, Ser 3.08  0.50 - 1.35 mg/dL    Calcium 65.7 (*) 8.4 - 10.5 mg/dL    Total Protein 6.5  6.0 - 8.3 g/dL    Albumin 3.4 (*) 3.5 - 5.2 g/dL    AST 17  0 - 37 U/L    ALT 17  0 - 53 U/L    Alkaline Phosphatase 90  39 - 117 U/L    Total Bilirubin 0.5  0.3 - 1.2 mg/dL    GFR calc non Af Amer 64 (*) >90 mL/min    GFR calc Af Amer 75 (*) >90 mL/min   URINALYSIS, ROUTINE  W REFLEX MICROSCOPIC     Status: Abnormal   Collection Time   01/02/12 12:37 PM      Component Value Range Comment   Color, Urine YELLOW  YELLOW    APPearance CLEAR  CLEAR    Specific Gravity, Urine 1.015  1.005 - 1.030    pH 5.5  5.0 - 8.0    Glucose, UA NEGATIVE  NEGATIVE mg/dL    Hgb urine dipstick NEGATIVE  NEGATIVE    Bilirubin Urine NEGATIVE  NEGATIVE    Ketones, ur 40 (*) NEGATIVE mg/dL    Protein, ur NEGATIVE  NEGATIVE mg/dL    Urobilinogen, UA 0.2  0.0 - 1.0 mg/dL    Nitrite NEGATIVE  NEGATIVE    Leukocytes, UA NEGATIVE  NEGATIVE MICROSCOPIC NOT DONE ON URINES WITH NEGATIVE PROTEIN, BLOOD, LEUKOCYTES, NITRITE, OR GLUCOSE <1000 mg/dL.  CBC     Status: Abnormal   Collection Time   01/02/12  4:00 PM      Component Value Range Comment   WBC 6.4  4.0 - 10.5 K/uL    RBC 3.89 (*) 4.22 - 5.81 MIL/uL    Hemoglobin 12.7 (*) 13.0 - 17.0 g/dL    HCT 84.6 (*) 96.2 - 52.0 %    MCV 96.9  78.0 - 100.0 fL    MCH 32.6  26.0 - 34.0 pg  MCHC 33.7  30.0 - 36.0 g/dL    RDW 40.9  81.1 - 91.4 %    Platelets 161  150 - 400 K/uL     Radiological Exams on Admission: Dg Ankle Complete Left  01/02/2012  *RADIOLOGY REPORT*  Clinical Data: Left ankle pain.  LEFT ANKLE COMPLETE - 3+ VIEW  Comparison: 12/12/2011.  Findings: There are tibiotalar and subtalar joint degenerative changes and mild osteopenia.  No definite acute fracture.  Vascular calcifications are again demonstrated.  Calcaneal spurs are noted.  IMPRESSION: No acute bony findings.  No change since prior study.   Original Report Authenticated By: Rudie Meyer, M.D.    Ct Head Wo Contrast  01/02/2012  *RADIOLOGY REPORT*  Clinical Data: Headache, weakness  CT HEAD WITHOUT CONTRAST  Technique:  Contiguous axial images were obtained from the base of the skull through the vertex without contrast.  Comparison: 08/10/2010  Findings: No intracranial hemorrhage, mass effect or midline shift. Paranasal sinuses and mastoid air cells are unremarkable.   Stable cerebral atrophy.  Stable mild periventricular chronic white matter disease.  No acute cortical infarction.  No mass lesion is noted on this unenhanced scan.  The gray and white matter differentiation is preserved.  No intracranial hemorrhage, mass effect or midline shift.  IMPRESSION: No acute intracranial abnormality.  Stable atrophy and chronic white matter disease.   Original Report Authenticated By: Natasha Mead, M.D.    Dg Chest Port 1 View  01/02/2012  *RADIOLOGY REPORT*  Clinical Data: Altered mental status.  Marland Kitchen  PORTABLE CHEST - 1 VIEW  Comparison: PA and lateral chest 05/27/2011 and single view of the chest 12/07/2010.  Findings: Lungs are clear.  Heart size is normal.  No pneumothorax or pleural fluid.  IMPRESSION: No acute disease.   Original Report Authenticated By: Holley Dexter, M.D.     Assessment/Plan  1. Severe Arthralgias/Myalgias: etiology unclear,  Viral syndrome vs connective tissue disorder(unlikely, but apparently sees/saw Dr.Troslow/rheum recently too) Check CK, ESR, CRP Does no have flu like symptoms so influenza unlikely Supportive care, IVF, NSAIDs I will give him a trial dose of prednisone If severe bony/joint pain persists and cause remains unclear could get Bone scan to r/o bony mets from prostate Ca PT eval  2.  Altered mental state: transient, related to narcotics vs acute illness  3. H/o DVT: coumadin stopped by PCP 2 weeks ago, he was felt to not need this anymore  4. H/o prostate cancer:   DVT proph: lovenox  Code status: FUll Family communication: brother Mayford Alberg [phone] Disposition: inpatient   Time Spent on Admission:  [facility] Pager: [phone] 01/02/2012, 4:38 PM

## 2012-01-02 NOTE — ED Notes (Signed)
Pt to XR then to [id number].

## 2012-01-02 NOTE — ED Notes (Signed)
Patient with onset of slurred speech 3 days ago.  Family reports his speech has returned to baseline.  Patient with slower movements and complaints of pain all over.  Patient denies headache.  He states his left leg has felt weaker for a few days.  He denies any diff dressing/eating.  Denies any diff swallowing.  Patient wears glasses, states he has had changes for a couple of weeks,  Perhaps worse for a few days.  He is blind in his left eye

## 2012-01-02 NOTE — Progress Notes (Addendum)
NURSING PROGRESS NOTE  [name] [id number] Admission Data: 01/02/2012 4:12 PM Attending Provider: Zannie Cove, MD WGN:[id number],MVHQION M, MD Code Status: full   [name] is a 78 y.o. male patient admitted from ED  No acute distress noted.  No c/o shortness of breath, no c/o chest pain.  .  Blood pressure 115/68, pulse 85, temperature 98.2 F (36.8 C), temperature source Oral, resp. rate 20, SpO2 95.00%.   IV Fluids:  IV in place, occlusive dsg intact without redness, IV cath forearm left, condition patent and no redness normal saline.   Allergies:  Tetanus toxoids  Past Medical History:   has a past medical history of Hypertension; Gout; High cholesterol; DVT (deep venous thrombosis), left (11/2010); Borderline diabetes; Stroke (08/2009; 07/2010); H/O heat stroke (08/2009); Transient ischemic attack (TIA) (07/2010); Arthritis; Prostate cancer (2013); Diabetes mellitus without complication; and Renal cyst.  Past Surgical History:   has past surgical history that includes Hip Arthroplasty (12/08/2010); Corneal transplant; I&D extremity (05/28/2011); and Colonoscopy ( 07/18/2003).  Social History:   reports that he quit smoking about 63 years ago. His smoking use included Cigarettes and Cigars. His smokeless tobacco use includes Chew. He reports that he does not drink alcohol or use illicit drugs.  Skin: intact  Orientation to room, and floor completed with information packet given to patient/family. Admission INP armband ID verified with patient/family, and in place.   SR up x 2, fall assessment complete, with patient and family able to verbalize understanding of risk associated with falls, and verbalized understanding to call for assistance before getting out of bed.   Call light within reach. Patient able to voice and demonstrate understanding of unit orientation instructions.   Will cont to eval and treat per MD orders.  Marki Frede, Elmarie Mainland, RN

## 2012-01-02 NOTE — ED Provider Notes (Signed)
History     CSN: [id number]  Arrival date & time 01/02/12  1012   First MD Initiated Contact with Patient 01/02/12 1032      Chief Complaint  Patient presents with  . Fatigue  . Altered Mental Status    (Consider location/radiation/quality/duration/timing/severity/associated sxs/prior treatment) HPI  [name] is a 78 y.o. male with past medical history significant for non-insulin-dependent diabetes, prior stroke high cholesterol and DVT brought in for altered mental status today by his brother. Patient normally cares for his own ADLs and lives alone. The past week patient has been "not acting like himself" as per his brother. His brother also reports a slurred speech that has now resolved. He is incontinent to urine last night because he could not get up out of bed. [name] nephew had to physically put him in the car to bring him to the emergency room. Patient states he is in pain from head to foot. This is a chronic pain but it is more significant than normal. He recently stopped taking Coumadin for a DVT as instructed by his primary care doctor.   Past Medical History  Diagnosis Date  . Hypertension   . Gout   . High cholesterol   . DVT (deep venous thrombosis), left 11/2010    LLE; S/P hip OR  . Borderline diabetes   . Stroke 08/2009; 07/2010  . H/O heat stroke 08/2009    /E-chart  . Transient ischemic attack (TIA) 07/2010    /E-chart  . Arthritis   . Prostate cancer 2013    adenocarcinoma PSA 9.45  . Diabetes mellitus without complication   . Renal cyst     Past Surgical History  Procedure Date  . Hip arthroplasty 12/08/2010    Procedure: ARTHROPLASTY BIPOLAR HIP;  Surgeon: [name];  Location: [facility] OR;  Service: Orthopedics;  Laterality: Left;  . Corneal transplant     bilaterally "5 one left since 1984; 4 on right"  . I&d extremity 05/28/2011    Procedure: IRRIGATION AND DEBRIDEMENT EXTREMITY;  Surgeon: Sharma Covert, MD;  Location: [facility] OR;  Service:  Orthopedics;  Laterality: Right;  I & D Right hand/wrist  . Colonoscopy  07/18/2003    RMR: Rectum internal hemorrhoids.  Otherwise, normal rectum/ Left-sided diverticula.  Remainder of colonic mucosa appeared normal.    Family History  Problem Relation Age of Onset  . Prostate cancer Brother   . Colon cancer Sister 27    History  Substance Use Topics  . Smoking status: Former Smoker    Types: Cigarettes, Cigars    Quit date: 01/27/1948  . Smokeless tobacco: Current User    Types: Chew     Comment: "quit smoking cigarettes age 41 or 21"  . Alcohol Use: No      Review of Systems  Unable to perform ROS: Mental status change  All other systems reviewed and are negative.    Allergies  Tetanus toxoids  Home Medications   Current Outpatient Rx  Name  Route  Sig  Dispense  Refill  . ALENDRONATE SODIUM 70 MG PO TABS   Oral   Take 70 mg by mouth every 7 (seven) days.          . ALLOPURINOL 300 MG PO TABS   Oral   Take 300 mg by mouth daily.         Marland Kitchen AMLODIPINE BESYLATE 5 MG PO TABS   Oral   Take 5 mg by mouth daily.          Marland Kitchen  ASCORBIC ACID 500 MG PO TABS   Oral   Take 500 mg by mouth every morning.          Marland Kitchen CALCIUM CARBONATE-VITAMIN D 500-200 MG-UNIT PO TABS   Oral   Take 1 tablet by mouth every morning.          . COD LIVER OIL PO   Oral   Take by mouth daily.         . COLCHICINE 0.6 MG PO TABS   Oral   Take 0.6 mg by mouth 2 (two) times daily as needed. For gout         . ENALAPRIL MALEATE 5 MG PO TABS   Oral   Take 2.5 mg by mouth every morning.          Marland Kitchen GLIPIZIDE ER 2.5 MG PO TB24   Oral   Take 2.5 mg by mouth daily.         Marland Kitchen HYDROCODONE-ACETAMINOPHEN 5-500 MG PO TABS               . METOPROLOL TARTRATE 50 MG PO TABS   Oral   Take 50 mg by mouth 2 (two) times daily.           . ADULT MULTIVITAMIN W/MINERALS CH   Oral   Take 1 tablet by mouth every morning.          Marland Kitchen PREDNISONE 10 MG PO TABS   Oral    Take 5 mg by mouth every other day.          Marland Kitchen SIMVASTATIN 20 MG PO TABS   Oral   Take 20 mg by mouth at bedtime.          Marland Kitchen VITAMIN E 400 UNITS PO CAPS   Oral   Take 400 Units by mouth every morning.          . WARFARIN SODIUM 1 MG PO TABS   Oral   Take 4-6 mg by mouth daily. Take 4 tablets by mouth daily Monday-Friday and 6 tablets Saturday and Sunday           BP 117/62  Pulse 83  Temp 97.9 F (36.6 C) (Oral)  Resp 18  SpO2 99%  Physical Exam  Nursing note and vitals reviewed. Constitutional: He is oriented to person, place, and time. He appears well-developed and well-nourished. No distress.  HENT:  Head: Normocephalic.       Dry mucous membranes  [name] face is smeared with food   Eyes: Conjunctivae normal and EOM are normal. Pupils are equal, round, and reactive to light.  Neck: Normal range of motion.  Cardiovascular: Normal rate, regular rhythm, normal heart sounds and intact distal pulses.   Pulmonary/Chest: Effort normal and breath sounds normal. No stridor.  Abdominal: Soft. Bowel sounds are normal. He exhibits no distension and no mass. There is no tenderness. There is no rebound and no guarding.  Musculoskeletal: Normal range of motion.  Neurological: He is alert and oriented to person, place, and time.       Somnolent but rousable to voice. No facial droop, asymmetry, dysarthria. Strength is 3/5x4 extremities. Upper extremities tremulous. Patient cannot ambulate.  Psychiatric: He has a normal mood and affect.    ED Course  Procedures (including critical care time)  Labs Reviewed  GLUCOSE, CAPILLARY - Abnormal; Notable for the following:    Glucose-Capillary 137 (*)     All other components within normal limits  CBC WITH DIFFERENTIAL - Abnormal;  Notable for the following:    RBC 4.11 (*)     Monocytes Relative 16 (*)     Monocytes Absolute 1.1 (*)     All other components within normal limits  COMPREHENSIVE METABOLIC PANEL - Abnormal;  Notable for the following:    Calcium 10.9 (*)     Albumin 3.4 (*)     GFR calc non Af Amer 64 (*)     GFR calc Af Amer 75 (*)     All other components within normal limits  URINALYSIS, ROUTINE W REFLEX MICROSCOPIC - Abnormal; Notable for the following:    Ketones, ur 40 (*)     All other components within normal limits   Ct Head Wo Contrast  01/02/2012  *RADIOLOGY REPORT*  Clinical Data: Headache, weakness  CT HEAD WITHOUT CONTRAST  Technique:  Contiguous axial images were obtained from the base of the skull through the vertex without contrast.  Comparison: 08/10/2010  Findings: No intracranial hemorrhage, mass effect or midline shift. Paranasal sinuses and mastoid air cells are unremarkable.  Stable cerebral atrophy.  Stable mild periventricular chronic white matter disease.  No acute cortical infarction.  No mass lesion is noted on this unenhanced scan.  The gray and white matter differentiation is preserved.  No intracranial hemorrhage, mass effect or midline shift.  IMPRESSION: No acute intracranial abnormality.  Stable atrophy and chronic white matter disease.   Original Report Authenticated By: Natasha Mead, M.D.    Dg Chest Port 1 View  01/02/2012  *RADIOLOGY REPORT*  Clinical Data: Altered mental status.  Marland Kitchen  PORTABLE CHEST - 1 VIEW  Comparison: PA and lateral chest 05/27/2011 and single view of the chest 12/07/2010.  Findings: Lungs are clear.  Heart size is normal.  No pneumothorax or pleural fluid.  IMPRESSION: No acute disease.   Original Report Authenticated By: Holley Dexter, M.D.     Date: 01/02/2012  Rate: 85  Rhythm: normal sinus rhythm  QRS Axis: left  Intervals: PR prolonged  ST/T Wave abnormalities: nonspecific ST/T changes  Conduction Disutrbances:first-degree A-V block , right bundle branch block and left anterior fascicular block  Narrative Interpretation:   Old EKG Reviewed: unchanged  1. Altered mental status       MDM  Shared Visit with attending Dr. Oletta Lamas.  Patient is A and O x3. Symmetric weakness on physical exam. Blood work and urinalysis unremarkable, chest x-ray also normal. Head CT shows no abnormalities.  Altered mental status likely secondary to polypharmacy and multiple pain control medications. Patient cannot be discharged as he is non-ambulatory  Pt willl be admitted to Dr. Jomarie Longs, [facility] team [address], PA-C 01/02/12 [phone]

## 2012-01-02 NOTE — Progress Notes (Signed)
Chaplain received phone call from pt's pastor asking me to visit pt and pray with him. [name] herself was out-of-town and could not come.) [name] also mentioned that pt's wife is currently a pt here as well. I visited pt and his two brothers in [place]. Pt is complaining of pain throughout his body. He was about to go to Xray and then to 5506. We had prayer together. He and brothers were appreciative of my visit.

## 2012-01-02 NOTE — ED Notes (Signed)
pts family member reports pt being altered and not acting like himself x 2 days, had slurred speech initially but it has resolved per family, grips are equal at triage. Pt having fatigue and falling asleep at triage, will awake and answer questions appropriately, reports only complaint is gout pain to bilateral shoulders and legs.

## 2012-01-03 DIAGNOSIS — I10 Essential (primary) hypertension: Secondary | ICD-10-CM

## 2012-01-03 LAB — CBC
HCT: 35.6 % — ABNORMAL LOW (ref 39.0–52.0)
Hemoglobin: 12 g/dL — ABNORMAL LOW (ref 13.0–17.0)
MCH: 32.1 pg (ref 26.0–34.0)
MCV: 95.2 fL (ref 78.0–100.0)
RBC: 3.74 MIL/uL — ABNORMAL LOW (ref 4.22–5.81)

## 2012-01-03 LAB — BASIC METABOLIC PANEL
BUN: 25 mg/dL — ABNORMAL HIGH (ref 6–23)
CO2: 19 mEq/L (ref 19–32)
Calcium: 9.9 mg/dL (ref 8.4–10.5)
Chloride: 106 mEq/L (ref 96–112)
Creatinine, Ser: 1.23 mg/dL (ref 0.50–1.35)
Glucose, Bld: 118 mg/dL — ABNORMAL HIGH (ref 70–99)

## 2012-01-03 LAB — C-REACTIVE PROTEIN: CRP: 4.1 mg/dL — ABNORMAL HIGH (ref <0.60)

## 2012-01-03 LAB — URIC ACID: Uric Acid, Serum: 10.7 mg/dL — ABNORMAL HIGH (ref 4.0–7.8)

## 2012-01-03 MED ORDER — GLIPIZIDE ER 2.5 MG PO TB24
2.5000 mg | ORAL_TABLET | Freq: Every day | ORAL | Status: DC
  Administered 2012-01-03 – 2012-01-04 (×2): 2.5 mg via ORAL
  Filled 2012-01-03 (×3): qty 1

## 2012-01-03 MED ORDER — INSULIN ASPART 100 UNIT/ML ~~LOC~~ SOLN
0.0000 [IU] | Freq: Three times a day (TID) | SUBCUTANEOUS | Status: DC
  Administered 2012-01-03: 3 [IU] via SUBCUTANEOUS
  Administered 2012-01-03: 17:00:00 via SUBCUTANEOUS

## 2012-01-03 MED ORDER — ACETAMINOPHEN 500 MG PO TABS
1000.0000 mg | ORAL_TABLET | Freq: Three times a day (TID) | ORAL | Status: DC
  Administered 2012-01-03 – 2012-01-04 (×4): 1000 mg via ORAL
  Filled 2012-01-03 (×6): qty 2

## 2012-01-03 MED ORDER — PREDNISONE 20 MG PO TABS
40.0000 mg | ORAL_TABLET | Freq: Every day | ORAL | Status: DC
  Administered 2012-01-04: 40 mg via ORAL
  Filled 2012-01-03 (×2): qty 2

## 2012-01-03 NOTE — Progress Notes (Signed)
PATIENT DETAILS Name: [name] Age: 78 y.o. Sex: male Date of Birth: 1931/10/23 Admit Date: 01/02/2012 Admitting Physician Zannie Cove, MD VZD:GLOVFIE,PPIRJJO M, MD  Subjective: Better this am  Assessment/Plan: Active Problems:  Altered mental state -suspected encephalopathy from Polypharmacy-back to baseline -avoid narcotics as much as possible  Severe Arthralgias/Myalgias -suspect rheumatologic issue-patient has been dealing with multiple joint pain over the past few months -responded well to a trial dose of prednisone, plan to discharge patient on prednisone-patient and brother claim-they will [facility]-as he has been managing this issue for the patient -start scheduled tylenol -avoid narcotics -get PT to see how ambulation is  H/o prostate cancer:  -outpatient follow up with Urology  H/o DVT -coumadin stopped by PCP 2 weeks ago, he was felt to not need this anymore -on prophylactic therapy  DM -restart Glipizide -place on SSI -monitor CBG's  HTN -c/w amlodipine and Metoprolol  Dyslipidemia -c/w Statins  Disposition: Remain inpatient  DVT Prophylaxis: Prophylactic Lovenox   Code Status: Full code   Procedures:  None  CONSULTS:  None  PHYSICAL EXAM: Vital signs in last 24 hours: Filed Vitals:   01/02/12 1546 01/02/12 1625 01/02/12 2136 01/03/12 0606  BP: 115/68  115/64 119/61  Pulse: 85  80 67  Temp: 98.2 F (36.8 C)  98.4 F (36.9 C) 97.5 F (36.4 C)  TempSrc: Oral  Oral Oral  Resp: 20  18 18   Height:  5\' 9"  (1.753 m)    Weight:  89.5 kg (197 lb 5 oz)    SpO2: 95%  92% 94%    Weight change:  Body mass index is 29.14 kg/(m^2).   Gen Exam: Awake and alert with clear speech.   Neck: Supple, No JVD.   Chest: B/L Clear.   CVS: S1 S2 Regular, no murmurs.  Abdomen: soft, BS +, non tender, non distended.  Extremities: no edema, lower extremities warm to touch.Mild left ankle tenderness-without any  overlying swelling or erythema Neurologic: Non Focal.   Skin: No Rash.   Wounds: N/A.    Intake/Output from previous day:  Intake/Output Summary (Last 24 hours) at 01/03/12 1028 Last data filed at 01/03/12 0631  Gross per 24 hour  Intake 1342.5 ml  Output    725 ml  Net  617.5 ml     LAB RESULTS: CBC  Lab 01/03/12 0610 01/02/12 1600 01/02/12 1038  WBC 4.9 6.4 6.7  HGB 12.0* 12.7* 13.5  HCT 35.6* 37.7* 39.7  PLT 154 161 167  MCV 95.2 96.9 96.6  MCH 32.1 32.6 32.8  MCHC 33.7 33.7 34.0  RDW 13.4 13.8 13.9  LYMPHSABS -- -- 1.9  MONOABS -- -- 1.1*  EOSABS -- -- 0.3  BASOSABS -- -- 0.1  BANDABS -- -- --    Chemistries   Lab 01/03/12 0610 01/02/12 1600 01/02/12 1038  NA 139 -- 138  K 4.7 -- 4.1  CL 106 -- 103  CO2 19 -- 22  GLUCOSE 118* -- 89  BUN 25* -- 17  CREATININE 1.23 1.01 1.07  CALCIUM 9.9 -- 10.9*  MG -- -- --    CBG:  Lab 01/02/12 1020  GLUCAP 137*    GFR Estimated Creatinine Clearance: 54.7 ml/min (by C-G formula based on Cr of 1.23).  Coagulation profile No results found for this basename: INR:5,PROTIME:5 in the last 168 hours  Cardiac Enzymes No results found for this basename: CK:3,CKMB:3,TROPONINI:3,MYOGLOBIN:3 in the last 168 hours  No components found with this  basename: POCBNP:3 No results found for this basename: DDIMER:2 in the last 72 hours No results found for this basename: HGBA1C:2 in the last 72 hours No results found for this basename: CHOL:2,HDL:2,LDLCALC:2,TRIG:2,CHOLHDL:2,LDLDIRECT:2 in the last 72 hours No results found for this basename: TSH,T4TOTAL,FREET3,T3FREE,THYROIDAB in the last 72 hours No results found for this basename: VITAMINB12:2,FOLATE:2,FERRITIN:2,TIBC:2,IRON:2,RETICCTPCT:2 in the last 72 hours No results found for this basename: LIPASE:2,AMYLASE:2 in the last 72 hours  Urine Studies No results found for this basename:  UACOL:2,UAPR:2,USPG:2,UPH:2,UTP:2,UGL:2,UKET:2,UBIL:2,UHGB:2,UNIT:2,UROB:2,ULEU:2,UEPI:2,UWBC:2,URBC:2,UBAC:2,CAST:2,CRYS:2,UCOM:2,BILUA:2 in the last 72 hours  MICROBIOLOGY: Recent Results (from the past 240 hour(s))  MRSA PCR SCREENING     Status: Abnormal   Collection Time   01/02/12  3:39 PM      Component Value Range Status Comment   MRSA by PCR POSITIVE (*) NEGATIVE Final     RADIOLOGY STUDIES/RESULTS: Dg Hip Complete Left  12/13/2011  *RADIOLOGY REPORT*  Clinical Data: Left knee popped, hip pain.  LEFT HIP - COMPLETE 2+ VIEW  Comparison: 12/07/2010.  Findings: Since the prior hip radiographs, the patient has undergone left hip hemiarthroplasty.  There is no evidence for periprosthetic fracture or dislocation.  Regional bones of the pelvis are intact.  IMPRESSION: No acute abnormality.  Satisfactory left hip hemiarthroplasty.   Original Report Authenticated By: Davonna Belling, M.D.    Dg Ankle Complete Left  01/02/2012  *RADIOLOGY REPORT*  Clinical Data: Left ankle pain.  LEFT ANKLE COMPLETE - 3+ VIEW  Comparison: 12/12/2011.  Findings: There are tibiotalar and subtalar joint degenerative changes and mild osteopenia.  No definite acute fracture.  Vascular calcifications are again demonstrated.  Calcaneal spurs are noted.  IMPRESSION: No acute bony findings.  No change since prior study.   Original Report Authenticated By: Rudie Meyer, M.D.    Dg Ankle Complete Left  12/13/2011  *RADIOLOGY REPORT*  Clinical Data: Left lower extremity pain.  LEFT ANKLE COMPLETE - 3+ VIEW  Comparison: None.  Findings:   There is no evidence of fracture or dislocation.  There is no evidence of arthropathy or other focal bony abnormality. Soft tissues are unremarkable. Moderate vascular calcification.  IMPRESSION:  No acute findings.   Original Report Authenticated By: Davonna Belling, M.D.    Ct Head Wo Contrast  01/02/2012  *RADIOLOGY REPORT*  Clinical Data: Headache, weakness  CT HEAD WITHOUT CONTRAST   Technique:  Contiguous axial images were obtained from the base of the skull through the vertex without contrast.  Comparison: 08/10/2010  Findings: No intracranial hemorrhage, mass effect or midline shift. Paranasal sinuses and mastoid air cells are unremarkable.  Stable cerebral atrophy.  Stable mild periventricular chronic white matter disease.  No acute cortical infarction.  No mass lesion is noted on this unenhanced scan.  The gray and white matter differentiation is preserved.  No intracranial hemorrhage, mass effect or midline shift.  IMPRESSION: No acute intracranial abnormality.  Stable atrophy and chronic white matter disease.   Original Report Authenticated By: Natasha Mead, M.D.    Dg Chest Port 1 View  01/02/2012  *RADIOLOGY REPORT*  Clinical Data: Altered mental status.  Marland Kitchen  PORTABLE CHEST - 1 VIEW  Comparison: PA and lateral chest 05/27/2011 and single view of the chest 12/07/2010.  Findings: Lungs are clear.  Heart size is normal.  No pneumothorax or pleural fluid.  IMPRESSION: No acute disease.   Original Report Authenticated By: Holley Dexter, M.D.    Dg Knee Complete 4 Views Left  12/13/2011  *RADIOLOGY REPORT*  Clinical Data: Pain pain.  Left knee popping.  LEFT KNEE - COMPLETE 4+ VIEW  Comparison: 12/07/2010.  Findings: Tricompartmental degenerative changes are present.  There is no fracture or dislocation.  No effusion is seen.  Advanced vascular calcification is noted.  Similar appearance to 2012.  IMPRESSION: No fracture or effusion.   Original Report Authenticated By: Davonna Belling, M.D.     MEDICATIONS: Scheduled Meds:   . acetaminophen  1,000 mg Oral TID  . amLODipine  5 mg Oral Daily  . aspirin EC  81 mg Oral Daily  . enoxaparin (LOVENOX) injection  30 mg Subcutaneous Q24H  . [COMPLETED] fentaNYL  25 mcg Intravenous Once  . metoprolol  50 mg Oral BID  . naproxen  375 mg Oral BID WC  . pantoprazole  40 mg Oral Q1200  . predniSONE  40 mg Oral Q breakfast  . [COMPLETED]  predniSONE  50 mg Oral Once  . simvastatin  20 mg Oral QHS  . [DISCONTINUED] predniSONE  10 mg Oral Q breakfast  . [DISCONTINUED] predniSONE  50 mg Oral Q breakfast   Continuous Infusions:   . [DISCONTINUED] sodium chloride 1,000 mL (01/02/12 1328)  . [DISCONTINUED] sodium chloride 1,000 mL (01/03/12 0206)   PRN Meds:.acetaminophen, acetaminophen, HYDROcodone-acetaminophen, [EXPIRED] ondansetron (ZOFRAN) IV  Antibiotics: Anti-infectives    None       Jeoffrey Massed, MD  [facility] Pager:[phone]  If 7PM-7AM, please contact night-coverage [email or web address] Password [email or web address] 01/03/2012, 10:28 AM   LOS: 1 day

## 2012-01-04 DIAGNOSIS — E7800 Pure hypercholesterolemia, unspecified: Secondary | ICD-10-CM

## 2012-01-04 MED ORDER — PREDNISONE 10 MG PO TABS: ORAL_TABLET | ORAL | Status: DC

## 2012-01-04 MED ORDER — CHLORHEXIDINE GLUCONATE CLOTH 2 % EX PADS: 6.0000 | MEDICATED_PAD | Freq: Every day | CUTANEOUS | Status: DC

## 2012-01-04 MED ORDER — MUPIROCIN 2 % EX OINT
1.0000 "application " | TOPICAL_OINTMENT | Freq: Two times a day (BID) | CUTANEOUS | Status: DC
  Administered 2012-01-04: 1 via NASAL
  Filled 2012-01-04: qty 22

## 2012-01-04 MED ORDER — ALLOPURINOL 100 MG PO TABS: 100.0000 mg | ORAL_TABLET | Freq: Every day | ORAL | Status: DC

## 2012-01-04 MED ORDER — PANTOPRAZOLE SODIUM 40 MG PO TBEC: 40.0000 mg | DELAYED_RELEASE_TABLET | Freq: Every day | ORAL | Status: DC

## 2012-01-04 NOTE — Progress Notes (Addendum)
Patient alert and oriented x4.  Pt's brother at bedside.  IV access removed.  Site clean, no redness or swelling to site.  Pt tolerated removal well.  Cannula intact.  Pt and pt's brother provided with discharge instructions and both denies any questions or concerns.  Pt discharged home with brother with printed discharge instructions, wheelchair, prescriptions, mupirocin ointment, personal walker and belongings.

## 2012-01-04 NOTE — Discharge Summary (Signed)
PATIENT DETAILS Name: [name] Age: 78 y.o. Sex: male Date of Birth: 03-18-1931 MRN: [id number]. Admit Date: 01/02/2012 Admitting Physician: Scott Cove, [name] NFA:[id number],HQIONGE [name]  Recommendations for Outpatient Follow-up:  1. Consider referral to Rheumatololgy-will defer decision to PCP  PRIMARY DISCHARGE DIAGNOSIS:  Active Problems:  Altered mental state  Ankle pain-probably from gout     PAST MEDICAL HISTORY: Past Medical History  Diagnosis Date  . Hypertension   . Gout   . High cholesterol   . DVT (deep venous thrombosis), left 11/2010    LLE; S/P hip OR  . Borderline diabetes   . Stroke 08/2009; 07/2010  . H/O heat stroke 08/2009    /E-chart  . Transient ischemic attack (TIA) 07/2010    /E-chart  . Arthritis   . Prostate cancer 2013    adenocarcinoma PSA 9.45  . Diabetes mellitus without complication   . Renal cyst     DISCHARGE MEDICATIONS:   Medication List     As of 01/04/2012 10:00 AM    STOP taking these medications         HYDROcodone-acetaminophen 5-500 MG per tablet   Commonly known as: VICODIN      oxyCODONE-acetaminophen 5-500 MG per capsule   Commonly known as: TYLOX      TAKE these medications         alendronate 70 MG tablet   Commonly known as: FOSAMAX   Take 70 mg by mouth every 7 (seven) days.      allopurinol 100 MG tablet   Commonly known as: ZYLOPRIM   Take 1 tablet (100 mg total) by mouth daily.      amLODipine 5 MG tablet   Commonly known as: NORVASC   Take 5 mg by mouth daily.      ascorbic acid 500 MG tablet   Commonly known as: VITAMIN C   Take 500 mg by mouth every morning.      aspirin EC 81 MG tablet   Take 81 mg by mouth daily.      calcium-vitamin D 500-200 MG-UNIT per tablet   Commonly known as: OSCAL WITH D   Take 1 tablet by mouth every morning.      COD LIVER OIL PO   Take by mouth daily.      enalapril 5 MG tablet   Commonly known as: VASOTEC   Take 2.5 mg by mouth every morning.     glipiZIDE 2.5 MG 24 hr tablet   Commonly known as: GLUCOTROL XL   Take 2.5 mg by mouth daily.      metoprolol 50 MG tablet   Commonly known as: LOPRESSOR   Take 50 mg by mouth 2 (two) times daily.      multivitamin with minerals Tabs   Take 1 tablet by mouth every morning.      pantoprazole 40 MG tablet   Commonly known as: PROTONIX   Take 1 tablet (40 mg total) by mouth daily at 12 noon.      predniSONE 10 MG tablet   Commonly known as: DELTASONE   Take 4 tablets orally daily for 2 days, then,  Take 3 tablets orally daily for 2 days, then,  Take 2 tablets orally daily for 2 days, then,   Take 1 tablet orally daily for 1 day and then stop      simvastatin 20 MG tablet   Commonly known as: ZOCOR   Take 20 mg by mouth at bedtime.  vitamin E 400 UNIT capsule   Take 400 Units by mouth every morning.         BRIEF HPI:  See H&P, Labs, Consult and Test reports for all details in brief, Mr.[name] is a 78/M with PMH of DM, HTN, gout, prostate cancer is brought to the ER by his brother to the ED on 01/02/12 for worsening of his ankle pain and severe pain shoulder, elbow, foot and most of his small and large joints to the extent that he was unable to ambulate even with his walker. He had been taking narcotics as needed for pain. Apparently when his brother saw himhe was lethargic, speech was slurred, and was unable to ambulate.  CONSULTATIONS:   None  PERTINENT RADIOLOGIC STUDIES: Dg Hip Complete Left  12/13/2011  *RADIOLOGY REPORT*  Clinical Data: Left knee popped, hip pain.  LEFT HIP - COMPLETE 2+ VIEW  Comparison: 12/07/2010.  Findings: Since the prior hip radiographs, the patient has undergone left hip hemiarthroplasty.  There is no evidence for periprosthetic fracture or dislocation.  Regional bones of the pelvis are intact.  IMPRESSION: No acute abnormality.  Satisfactory left hip hemiarthroplasty.   Original Report Authenticated By: [name], M.D.    Dg Ankle  Complete Left  01/02/2012  *RADIOLOGY REPORT*  Clinical Data: Left ankle pain.  LEFT ANKLE COMPLETE - 3+ VIEW  Comparison: 12/12/2011.  Findings: There are tibiotalar and subtalar joint degenerative changes and mild osteopenia.  No definite acute fracture.  Vascular calcifications are again demonstrated.  Calcaneal spurs are noted.  IMPRESSION: No acute bony findings.  No change since prior study.   Original Report Authenticated By: [name], M.D.    Dg Ankle Complete Left  12/13/2011  *RADIOLOGY REPORT*  Clinical Data: Left lower extremity pain.  LEFT ANKLE COMPLETE - 3+ VIEW  Comparison: None.  Findings:   There is no evidence of fracture or dislocation.  There is no evidence of arthropathy or other focal bony abnormality. Soft tissues are unremarkable. Moderate vascular calcification.  IMPRESSION:  No acute findings.   Original Report Authenticated By: [name], M.D.    Ct Head Wo Contrast  01/02/2012  *RADIOLOGY REPORT*  Clinical Data: Headache, weakness  CT HEAD WITHOUT CONTRAST  Technique:  Contiguous axial images were obtained from the base of the skull through the vertex without contrast.  Comparison: 08/10/2010  Findings: No intracranial hemorrhage, mass effect or midline shift. Paranasal sinuses and mastoid air cells are unremarkable.  Stable cerebral atrophy.  Stable mild periventricular chronic white matter disease.  No acute cortical infarction.  No mass lesion is noted on this unenhanced scan.  The gray and white matter differentiation is preserved.  No intracranial hemorrhage, mass effect or midline shift.  IMPRESSION: No acute intracranial abnormality.  Stable atrophy and chronic white matter disease.   Original Report Authenticated By: [name], M.D.    Dg Chest Port 1 View  01/02/2012  *RADIOLOGY REPORT*  Clinical Data: Altered mental status.  Marland Kitchen  PORTABLE CHEST - 1 VIEW  Comparison: PA and lateral chest 05/27/2011 and single view of the chest 12/07/2010.  Findings: Lungs are  clear.  Heart size is normal.  No pneumothorax or pleural fluid.  IMPRESSION: No acute disease.   Original Report Authenticated By: [name], M.D.    Dg Knee Complete 4 Views Left  12/13/2011  *RADIOLOGY REPORT*  Clinical Data: Pain pain.  Left knee popping.  LEFT KNEE - COMPLETE 4+ VIEW  Comparison: 12/07/2010.  Findings: Tricompartmental degenerative  changes are present.  There is no fracture or dislocation.  No effusion is seen.  Advanced vascular calcification is noted.  Similar appearance to 2012.  IMPRESSION: No fracture or effusion.   Original Report Authenticated By: [name], M.D.      PERTINENT LAB RESULTS: CBC:  Basename 01/03/12 0610 01/02/12 1600  WBC 4.9 6.4  HGB 12.0* 12.7*  HCT 35.6* 37.7*  PLT 154 161   CMET CMP     Component Value Date/Time   NA 139 01/03/2012 0610   NA 147 11/20/2011 1036   K 4.7 01/03/2012 0610   K 4.5 11/20/2011 1036   CL 106 01/03/2012 0610   CL 109 11/20/2011 1036   CO2 19 01/03/2012 0610   CO2 25 11/20/2011 1036   GLUCOSE 118* 01/03/2012 0610   BUN 25* 01/03/2012 0610   BUN 11 11/20/2011 1036   CREATININE 1.23 01/03/2012 0610   CREATININE 0.98 12/01/2011 1552   CALCIUM 9.9 01/03/2012 0610   CALCIUM 10.4 11/20/2011 1036   PROT 6.5 01/02/2012 1038   PROT 6.2 11/20/2011 1036   ALBUMIN 3.4* 01/02/2012 1038   AST 17 01/02/2012 1038   AST 25 11/20/2011 1036   ALT 17 01/02/2012 1038   ALKPHOS 90 01/02/2012 1038   ALKPHOS 67 11/20/2011 1036   BILITOT 0.5 01/02/2012 1038   BILITOT 0.6 11/20/2011 1036   GFRNONAA 54* 01/03/2012 0610   GFRAA 63* 01/03/2012 0610    GFR Estimated Creatinine Clearance: 54.7 ml/min (by C-G formula based on Cr of 1.23). No results found for this basename: LIPASE:2,AMYLASE:2 in the last 72 hours  Basename 01/02/12 1811  CKTOTAL 25  CKMB --  CKMBINDEX --  TROPONINI --   No components found with this basename: POCBNP:3 No results found for this basename: DDIMER:2 in the last 72 hours No results found for  this basename: HGBA1C:2 in the last 72 hours No results found for this basename: CHOL:2,HDL:2,LDLCALC:2,TRIG:2,CHOLHDL:2,LDLDIRECT:2 in the last 72 hours No results found for this basename: TSH,T4TOTAL,FREET3,T3FREE,THYROIDAB in the last 72 hours No results found for this basename: VITAMINB12:2,FOLATE:2,FERRITIN:2,TIBC:2,IRON:2,RETICCTPCT:2 in the last 72 hours Coags: No results found for this basename: PT:2,INR:2 in the last 72 hours Microbiology: Recent Results (from the past 240 hour(s))  MRSA PCR SCREENING     Status: Abnormal   Collection Time   01/02/12  3:39 PM      Component Value Range Status Comment   MRSA by PCR POSITIVE (*) NEGATIVE Final      BRIEF HOSPITAL COURSE:   Active Problems:  Altered mental state -Altered mental state  -suspected encephalopathy from Polypharmacy-this resolved fairly quickly and has come back to baseline.CT Scan of the head without contrast on admission was negative for acute abnormalities. Since his altered mental status improved so quickly no further work was persued.  Severe Arthralgias/Myalgias -this was mostly involving the left ankle area, and b/l shoulder areas. There some aches and pains in the elbows and small hand joints as well. Apparently he had seen Dr Kellie Simmering (Rheum) in the past. On admission he was given a trial of steroids with dramatic improvement. He was then continued on steroids during his hospital course, ESR was 33 and CRP was 4.1. Uric acide was elevated at 10.7. He will be prescribed a tapering dose of prednisone on discharge, will also start on Allopurinol, and will defer further work up to be done in the outpatient setting.A follow up appointment with Dr Harland Dingwall has been made-at the patient's request.  H/o prostate cancer:  -outpatient  follow up with Urology   H/o DVT  -coumadin stopped by PCP 2 weeks ago, he was felt to not need this anymore  -on prophylactic therapy during inpatient stay  DM  -c/w Glipizide -this was  stable  HTN  -c/w amlodipine and Metoprolol   Dyslipidemia  -c/w Statins  TODAY-DAY OF DISCHARGE:  Subjective:   Preet Perrier today has no headache,no chest abdominal pain,no new weakness tingling or numbness, feels much better wants to go home today. He has significantly less pain in the left ankle area. His shoulder and elbow pain have completely resolved.  Objective:   Blood pressure 115/61, pulse 63, temperature 97.5 F (36.4 C), temperature source Oral, resp. rate 18, height 5\' 9"  (1.753 m), weight 89.5 kg (197 lb 5 oz), SpO2 95.00%.  Intake/Output Summary (Last 24 hours) at 01/04/12 1000 Last data filed at 01/04/12 0800  Gross per 24 hour  Intake    120 ml  Output    825 ml  Net   -705 ml    Exam Awake Alert, Oriented *3, No new F.N deficits, Normal affect Adams.AT,PERRAL Supple Neck,No JVD, No cervical lymphadenopathy appriciated.  Symmetrical Chest wall movement, Good air movement bilaterally, CTAB RRR,No Gallops,Rubs or new Murmurs, No Parasternal Heave +ve B.Sounds, Abd Soft, Non tender, No organomegaly appriciated, No rebound -guarding or rigidity. No Cyanosis, Clubbing or edema, No new Rash or bruise -Left Ankle-no swelling or erythema present. Not tender to palpation  DISCHARGE CONDITION: Stable  DISPOSITION: HOME  DISCHARGE INSTRUCTIONS:    Activity:  As tolerated   Diet recommendation: Diabetic Diet Heart Healthy diet      Follow-up Information    Follow up with [facility] [name]. On 01/07/2012. (aapt at 10:45 am)    Contact information:   [address] [place] [address] [phone]         Total Time spent on discharge equals 45 minutes.  SignedJeoffrey Massed 01/04/2012 10:00 AM

## 2012-01-04 NOTE — Evaluation (Signed)
Physical Therapy Evaluation Patient Details Name: PRAKASH KIMBERLING MRN: [id number] DOB: 1931-03-31 Today's Date: 01/04/2012 Time: 4098-1191 PT Time Calculation (min): 12 min  PT Assessment / Plan / Recommendation Clinical Impression  Pt adm with multiple painful joints.  Pt now with improved pain and back to or close to baseline.    PT Assessment  Patent does not need any further PT services    Follow Up Recommendations       Does the patient have the potential to tolerate intense rehabilitation      Barriers to Discharge        Equipment Recommendations  None recommended by PT    Recommendations for Other Services     Frequency      Precautions / Restrictions Precautions Precautions: Fall Restrictions Weight Bearing Restrictions: No   Pertinent Vitals/Pain Lt hip pain. Nurse and MD aware.      Mobility  Bed Mobility Bed Mobility: Supine to Sit;Sitting - Scoot to Edge of Bed;Sit to Supine Supine to Sit: 6: Modified independent (Device/Increase time) Sitting - Scoot to Edge of Bed: 6: Modified independent (Device/Increase time) Sit to Supine: 6: Modified independent (Device/Increase time) Details for Bed Mobility Assistance: Incr time Transfers Transfers: Sit to Stand;Stand to Sit Sit to Stand: 6: Modified independent (Device/Increase time);With upper extremity assist;From bed Stand to Sit: 6: Modified independent (Device/Increase time);With upper extremity assist;To bed Ambulation/Gait Ambulation/Gait Assistance: 5: Supervision Ambulation Distance (Feet): 150 Feet Assistive device: Rolling walker Ambulation/Gait Assistance Details: slight limp on lt. Gait Pattern: Antalgic    Shoulder Instructions     Exercises     PT Diagnosis:    PT Problem List:   PT Treatment Interventions:     PT Goals    Visit Information  Last PT Received On: 01/04/12 Assistance Needed: +1    Subjective Data  Subjective: Pt states his left hip hurts after sitting on low  commode. Patient Stated Goal: Go home.   Prior Functioning  Home Living Lives With: Spouse Available Help at Discharge: Family;Personal care attendant;Available 24 hours/day Type of Home: House Home Access: Stairs to enter [employer] of Steps: 3 Entrance Stairs-Rails: None Home Layout: One level Home Adaptive Equipment: Bedside commode/3-in-1;Walker - rolling;Tub transfer bench Prior Function Level of Independence: Independent with assistive device(s) (for gait and transfers) Communication Communication: HOH    Cognition  Overall Cognitive Status: Appears within functional limits for tasks assessed/performed Arousal/Alertness: Awake/alert Orientation Level: Appears intact for tasks assessed Behavior During Session: [facility] for tasks performed    Extremity/Trunk Assessment Right Lower Extremity Assessment RLE ROM/Strength/Tone: [facility] for tasks assessed Left Lower Extremity Assessment LLE ROM/Strength/Tone: Deficits;Due to pain LLE ROM/Strength/Tone Deficits: grossly 4/5   Balance Static Standing Balance Static Standing - Balance Support: Bilateral upper extremity supported (on walker) Static Standing - Level of Assistance: 6: Modified independent (Device/Increase time)  End of Session PT - End of Session Equipment Utilized During Treatment: Gait belt Activity Tolerance: Patient tolerated treatment well Patient left: with call bell/phone within reach;with bed alarm set Nurse Communication: Mobility status;Patient requests pain meds  GP     Macee Venables 01/04/2012, 11:27 AM  [facility] PT [phone]

## 2012-01-04 NOTE — Progress Notes (Signed)
Spoke with patient, he states he lives with spouse and has 24 hr care between 3 aides, he has a rolling walker.  Per Physical therapy patient has no pt needs.  Patient is for discharge today, he states he is in the doughnut hole,  The three scripts given to him from MD are allopurinol which he has at home, prednisone which he can get from [facility] for $4 and Protonix.  Patient uses [facility] , I called them to verify patient is in doughnut hole , they state yes he is and the protonix will be $23 for 30 pills for him.  Patient states he can afford this.  Patient will get the protonix from [facility] pharmacy and get the prednisone from [facility].  Patient has transportation at discharge.

## 2012-01-04 NOTE — Care Management Note (Signed)
    Page 1 of 1   01/04/2012     3:26:17 PM   CARE MANAGEMENT NOTE 01/04/2012  Patient:  KALYB, PEMBLE   Account Number:  [id number]  Date Initiated:  01/04/2012  Documentation initiated by:  Letha Cape  Subjective/Objective Assessment:   dx ams  admit- lives with spouse, patient has 24 hr care between the three aides he has.     Action/Plan:   Anticipated DC Date:  01/04/2012   Anticipated DC Plan:  HOME W HOME HEALTH SERVICES      DC Planning Services  CM consult      [facility] Choice  HOME HEALTH   Choice offered to / List presented to:             Status of service:  Completed, signed off Medicare Important Message given?   (If response is "NO", the following Medicare IM given date fields will be blank) Date Medicare IM given:   Date Additional Medicare IM given:    Discharge Disposition:  HOME W HOME HEALTH SERVICES  Per UR Regulation:  Reviewed for med. necessity/level of care/duration of stay  If discussed at Long Length of Stay Meetings, dates discussed:    Comments:  01/04/12 15:22 Letha Cape RN, BSN [phone] patient lives with spouse, and has 24 hr care at home with 3 aides.  The aides will remain to take care of patient at discharge.  Patient has 3 scripts, allopurinol which he has at home, prednisone which he can get from [facility] for $4 and protonix which will be $23 from [facility] which is the pharmacy patient uses.  Patient states he can afford the $23 , I faxed script to [facility] pharmacy. Patient for dc today.

## 2012-01-05 NOTE — ED Provider Notes (Signed)
Medical screening examination/treatment/procedure(s) were conducted as a shared visit with non-physician practitioner(s) and myself.  I personally evaluated the patient during the encounter  Pt is afebrile, elderly, on multiple medications including narcotic analgesics.  Pt with gradual alteration to mental status.  No stiff neck, rash, fever.  Pt has had neg work up in the ED, but is clearly changed from baseline.  Will admit for altered mentation.    Gavin Pound. Oletta Lamas, MD 01/05/12 1308

## 2012-01-27 DIAGNOSIS — I4891 Unspecified atrial fibrillation: Secondary | ICD-10-CM | POA: Insufficient documentation

## 2012-02-09 DIAGNOSIS — I499 Cardiac arrhythmia, unspecified: Secondary | ICD-10-CM

## 2012-02-09 DIAGNOSIS — R7989 Other specified abnormal findings of blood chemistry: Secondary | ICD-10-CM

## 2012-02-09 DIAGNOSIS — R55 Syncope and collapse: Secondary | ICD-10-CM

## 2012-02-11 ENCOUNTER — Other Ambulatory Visit: Payer: Self-pay | Admitting: *Deleted

## 2012-02-11 DIAGNOSIS — R55 Syncope and collapse: Secondary | ICD-10-CM

## 2012-02-11 DIAGNOSIS — I214 Non-ST elevation (NSTEMI) myocardial infarction: Secondary | ICD-10-CM

## 2012-02-17 ENCOUNTER — Encounter (HOSPITAL_COMMUNITY): Payer: Medicare Other

## 2012-02-17 ENCOUNTER — Inpatient Hospital Stay (HOSPITAL_COMMUNITY): Admission: RE | Admit: 2012-02-17 | Payer: Medicare Other | Source: Ambulatory Visit

## 2012-02-17 ENCOUNTER — Encounter: Payer: Self-pay | Admitting: *Deleted

## 2012-02-24 ENCOUNTER — Encounter (HOSPITAL_COMMUNITY)
Admission: RE | Admit: 2012-02-24 | Discharge: 2012-02-24 | Disposition: A | Discharge status: Home / Self Care | Payer: Medicare Other | Source: Ambulatory Visit | Attending: Physician Assistant | Admitting: Physician Assistant

## 2012-02-24 ENCOUNTER — Ambulatory Visit (HOSPITAL_COMMUNITY)
Admission: RE | Admit: 2012-02-24 | Discharge: 2012-02-24 | Disposition: A | Discharge status: Home / Self Care | Payer: Medicare Other | Source: Ambulatory Visit | Attending: Cardiology | Admitting: Cardiology

## 2012-02-24 ENCOUNTER — Encounter (HOSPITAL_COMMUNITY): Payer: Self-pay | Admitting: Cardiology

## 2012-02-24 ENCOUNTER — Encounter (HOSPITAL_COMMUNITY): Payer: Self-pay

## 2012-02-24 DIAGNOSIS — R55 Syncope and collapse: Secondary | ICD-10-CM

## 2012-02-24 DIAGNOSIS — I214 Non-ST elevation (NSTEMI) myocardial infarction: Secondary | ICD-10-CM | POA: Insufficient documentation

## 2012-02-24 MED ORDER — TECHNETIUM TC 99M SESTAMIBI - CARDIOLITE
30.0000 | Freq: Once | INTRAVENOUS | Status: AC | PRN
  Administered 2012-02-24: 11:00:00 30 via INTRAVENOUS

## 2012-02-24 MED ORDER — SODIUM CHLORIDE 0.9 % IJ SOLN
INTRAMUSCULAR | Status: AC
  Administered 2012-02-24: 10 mL via INTRAVENOUS
  Filled 2012-02-24: qty 10

## 2012-02-24 MED ORDER — REGADENOSON 0.4 MG/5ML IV SOLN
INTRAVENOUS | Status: AC
  Administered 2012-02-24: 0.4 mg via INTRAVENOUS
  Filled 2012-02-24: qty 5

## 2012-02-24 MED ORDER — TECHNETIUM TC 99M SESTAMIBI - CARDIOLITE
10.0000 | Freq: Once | INTRAVENOUS | Status: AC | PRN
  Administered 2012-02-24: 10 via INTRAVENOUS

## 2012-02-24 NOTE — Progress Notes (Signed)
Stress Lab Nurses Notes - [name] 02/24/2012 Reason for doing test: Syncope Type of test: [name] Nurse performing test: Parke Poisson, RN Nuclear Medicine Tech: Lou Cal Echo Tech: Not Applicable MD performing test: R. Dietrich Pates Family MD: Dr.  Regino Schultze Test explained and consent signed: yes IV started: 22g jelco, Saline lock flushed, No redness or edema and Saline lock started in radiology Symptoms: None Treatment/Intervention: None Reason test stopped: protocol completed After recovery IV was: Discontinued via X-ray tech and No redness or edema Patient to return to Nuc. Med at : 11:45 Patient discharged: Home [name] Condition upon discharge was: stable Comments: During test BP 141/101 & HR 114.  Recovery BP 136/79 & HR 98.  Symptoms resolved in recovery. Erskine Speed T

## 2012-02-26 ENCOUNTER — Telehealth: Payer: Self-pay | Admitting: Cardiology

## 2012-02-26 NOTE — Telephone Encounter (Signed)
PT CALLING FOR STRESS TEST RESULTS

## 2012-02-26 NOTE — Telephone Encounter (Signed)
Negative stress results called to patient

## 2012-03-15 ENCOUNTER — Encounter: Payer: Medicare Other | Admitting: Cardiology

## 2012-03-15 ENCOUNTER — Encounter: Payer: Self-pay | Admitting: Cardiology

## 2012-03-15 ENCOUNTER — Ambulatory Visit (INDEPENDENT_AMBULATORY_CARE_PROVIDER_SITE_OTHER): Payer: Medicare Other | Admitting: Cardiology

## 2012-03-15 VITALS — BP 108/72 | HR 97 | Ht 68.5 in | Wt 216.0 lb

## 2012-03-15 DIAGNOSIS — IMO0002 Reserved for concepts with insufficient information to code with codable children: Secondary | ICD-10-CM | POA: Insufficient documentation

## 2012-03-15 DIAGNOSIS — I10 Essential (primary) hypertension: Secondary | ICD-10-CM

## 2012-03-15 DIAGNOSIS — C61 Malignant neoplasm of prostate: Secondary | ICD-10-CM | POA: Insufficient documentation

## 2012-03-15 DIAGNOSIS — R55 Syncope and collapse: Secondary | ICD-10-CM | POA: Insufficient documentation

## 2012-03-15 DIAGNOSIS — M109 Gout, unspecified: Secondary | ICD-10-CM | POA: Insufficient documentation

## 2012-03-15 DIAGNOSIS — R7301 Impaired fasting glucose: Secondary | ICD-10-CM | POA: Insufficient documentation

## 2012-03-15 DIAGNOSIS — I80299 Phlebitis and thrombophlebitis of other deep vessels of unspecified lower extremity: Secondary | ICD-10-CM

## 2012-03-15 DIAGNOSIS — E785 Hyperlipidemia, unspecified: Secondary | ICD-10-CM | POA: Insufficient documentation

## 2012-03-15 DIAGNOSIS — I80202 Phlebitis and thrombophlebitis of unspecified deep vessels of left lower extremity: Secondary | ICD-10-CM

## 2012-03-15 DIAGNOSIS — I4891 Unspecified atrial fibrillation: Secondary | ICD-10-CM

## 2012-03-15 NOTE — Patient Instructions (Addendum)
Your physician recommends that you schedule a follow-up appointment in: 3 MONTHS  CALL OFFICE FOR RECURRENT SYNCOPE OR DIZZINESS   MD RR NOTED YOUR HEART WAS NOT BEHAVING CORRECTLY WHILE IN THE HOSPITAL DUE TO YOUR OTHER PROBLEMS, CURRENTLY NO NEED FOR FURTHER TESTING ON YOUR HEART IS NEEDED AT THIS TIME, MD RR WILL CONTINUE TO MONITOR

## 2012-03-15 NOTE — Progress Notes (Signed)
Patient ID: [name], male   DOB: 1931/02/02, 79 y.o.   MRN: [id number]  HPI: Scheduled return visit for this nice gentleman who I have not seen for many years and who has no known cardiovascular disease other than well-controlled hypertension. He was recently admitted to [facility] for treatment of gout and suffered a syncopal spell.Those records were obtained and reviewed.  Principal diagnosis was influenza with dehydration and acute renal insufficiency on a background of COPD.  Paroxysmal atrial fibrillation was noted on monitoring and was apparently asymptomatic.  A single episode of syncope occurred in hospital without injury; rhythm during that episode is unclear. Patient reported orthostatic symptoms, but orthostatic hypotension was not documented.  Troponins were slightly elevated with values of 0.68, 0.88 and 0.73. Creatinine was elevated to 1.39 on admission, subsequently decreasing to 0.7 following hydration.  Current Outpatient Prescriptions  Medication Sig Dispense Refill  . alendronate (FOSAMAX) 70 MG tablet Take 70 mg by mouth every 7 (seven) days.       Marland Kitchen allopurinol (ZYLOPRIM) 100 MG tablet Take 1 tablet (100 mg total) by mouth daily.  30 tablet  0  . amLODipine (NORVASC) 5 MG tablet Take 5 mg by mouth daily.       Marland Kitchen ascorbic acid (VITAMIN C) 500 MG tablet Take 500 mg by mouth every morning.       Marland Kitchen aspirin EC 81 MG tablet Take 81 mg by mouth daily.      . calcium-vitamin D (OSCAL WITH D) 500-200 MG-UNIT per tablet Take 1 tablet by mouth every morning.       . cholecalciferol (VITAMIN D) 1000 UNITS tablet Take 1,000 Units by mouth daily.      . COD LIVER OIL PO Take by mouth daily.      . enalapril (VASOTEC) 5 MG tablet Take 2.5 mg by mouth every morning.       . fish oil-omega-3 fatty acids 1000 MG capsule Take 1 g by mouth daily.      Marland Kitchen glipiZIDE (GLUCOTROL XL) 2.5 MG 24 hr tablet Take 2.5 mg by mouth daily.      . metoprolol (LOPRESSOR) 50 MG tablet Take 50 mg by mouth  2 (two) times daily.        . Multiple Vitamin (MULITIVITAMIN WITH MINERALS) TABS Take 1 tablet by mouth every morning.       . predniSONE (DELTASONE) 10 MG tablet Take 4 tablets orally daily for 2 days, then, Take 3 tablets orally daily for 2 days, then, Take 2 tablets orally daily for 2 days, then,  Take 1 tablet orally daily for 1 day and then stop  19 tablet  0  . simvastatin (ZOCOR) 20 MG tablet Take 20 mg by mouth at bedtime.       . vitamin E 400 UNIT capsule Take 400 Units by mouth every morning.        No current facility-administered medications for this visit.    Allergies  Allergen Reactions  . Tetanus Toxoids Swelling    Also allergic to "high powered pain medications."     Past medical history, social history, and family history reviewed and updated.  ROS: denies chest discomfort, dyspnea, orthopnea or PND. He does note intermittent dizziness, perhaps related to standing.  All other systems reviewed and are negative.  PHYSICAL EXAM: BP 112/73  Pulse 91  Ht 5' 8.5" (1.74 m)  Wt 97.977 kg (216 lb)  BMI 32.36 kg/m2  SpO2 96%;  Body mass index  is 32.36 kg/(m^2). General-Well developed; no acute distress Body habitus-moderately overweight Neck-No JVD; no carotid bruits Lungs-clear lung fields; resonant to percussion; decreased breath sounds at the bases; mild kyphosis Cardiovascular-normal PMI; normal S1 and S2; moderately frequent prematures Abdomen-normal bowel sounds; soft and non-tender without masses or organomegaly Musculoskeletal-No deformities, no cyanosis or clubbing Neurologic-Normal cranial nerves; symmetric strength and tone Skin-Warm, no significant lesions Extremities-distal pulses intact; 1+ ankle and pretibial edema  [place] Bing, MD 03/15/2012  12:19 PM  ASSESSMENT AND PLAN

## 2012-03-15 NOTE — Progress Notes (Deleted)
Name: [name]    DOB: 1932-01-25  Age: 79 y.o.  MR#: [id number]       PCP:  Kirk Ruths, MD      Insurance: Payor: MEDICARE  Plan: MEDICARE PART A AND B  Product Type: *No Product type*    CC:   LIST NEGATIVE STRESS TEST 02-24-12 LABS WITH HOSPITAL ENCOUNTER 01-05-12 CARDIONET 02-11-12  VS Filed Vitals:   03/15/12 1121  BP: 120/64  Pulse: 96  Height: 5' 8.5" (1.74 m)  Weight: 216 lb (97.977 kg)  SpO2: 96%    Weights Current Weight  03/15/12 216 lb (97.977 kg)  01/02/12 197 lb 5 oz (89.5 kg)  11/30/11 211 lb 12.8 oz (96.072 kg)    Blood Pressure  BP Readings from Last 3 Encounters:  03/15/12 120/64  01/04/12 120/66  12/12/11 151/82     Admit date:  (Not on file) Last encounter with RMR:  02/26/2012   Allergy Tetanus toxoids  Current Outpatient Prescriptions  Medication Sig Dispense Refill  . alendronate (FOSAMAX) 70 MG tablet Take 70 mg by mouth every 7 (seven) days.       Marland Kitchen allopurinol (ZYLOPRIM) 100 MG tablet Take 1 tablet (100 mg total) by mouth daily.  30 tablet  0  . amLODipine (NORVASC) 5 MG tablet Take 5 mg by mouth daily.       Marland Kitchen ascorbic acid (VITAMIN C) 500 MG tablet Take 500 mg by mouth every morning.       Marland Kitchen aspirin EC 81 MG tablet Take 81 mg by mouth daily.      . calcium-vitamin D (OSCAL WITH D) 500-200 MG-UNIT per tablet Take 1 tablet by mouth every morning.       . cholecalciferol (VITAMIN D) 1000 UNITS tablet Take 1,000 Units by mouth daily.      . COD LIVER OIL PO Take by mouth daily.      . enalapril (VASOTEC) 5 MG tablet Take 2.5 mg by mouth every morning.       . fish oil-omega-3 fatty acids 1000 MG capsule Take 1 g by mouth daily.      Marland Kitchen glipiZIDE (GLUCOTROL XL) 2.5 MG 24 hr tablet Take 2.5 mg by mouth daily.      . metoprolol (LOPRESSOR) 50 MG tablet Take 50 mg by mouth 2 (two) times daily.        . Multiple Vitamin (MULITIVITAMIN WITH MINERALS) TABS Take 1 tablet by mouth every morning.       . predniSONE (DELTASONE) 10 MG tablet Take  4 tablets orally daily for 2 days, then, Take 3 tablets orally daily for 2 days, then, Take 2 tablets orally daily for 2 days, then,  Take 1 tablet orally daily for 1 day and then stop  19 tablet  0  . simvastatin (ZOCOR) 20 MG tablet Take 20 mg by mouth at bedtime.       . vitamin E 400 UNIT capsule Take 400 Units by mouth every morning.        No current facility-administered medications for this visit.    Discontinued Meds:    Medications Discontinued During This Encounter  Medication Reason  . pantoprazole (PROTONIX) 40 MG tablet Error    Patient Active Problem List  Diagnosis  . Hypertension  . Gout  . Hyperlipidemia  . Deep vein thrombophlebitis of left leg  . Fasting hyperglycemia  . Stroke  . Syncope  . Prostate cancer    LABS @BMET3 @  @CMPRESULT3 @ @CBC3 @  Lipid  Panel     Component Value Date/Time   CHOL 175 08/12/2010 0544   TRIG 161* 08/12/2010 0544   HDL 32* 08/12/2010 0544   CHOLHDL 5.5 08/12/2010 0544   VLDL 32 08/12/2010 0544   LDLCALC 111* 08/12/2010 0544    ABG    Component Value Date/Time   TCO2 23 11/09/2011 1433     BNP (last 3 results) No results found for this basename: PROBNP,  in the last 8760 hours Cardiac Panel (last 3 results) No results found for this basename: CKTOTAL, CKMB, TROPONINI, RELINDX,  in the last 72 hours  Iron/TIBC/Ferritin No results found for this basename: iron, tibc, ferritin     EKG Orders placed in visit on 02/11/12  . CARDIAC EVENT MONITOR     Prior Assessment and Plan Problem List as of 03/15/2012     ICD-9-CM   Hypertension   Gout   Hyperlipidemia   Deep vein thrombophlebitis of left leg   Fasting hyperglycemia   Stroke   Syncope   Prostate cancer       Imaging: Nm Myocar Single W/spect W/wall Motion And Ef  02/25/2012  nm myoview pharmacologic stress  Ordering Physician: Prescott Parma  Reading Physician: [place] Bing  Clinical Data: 79 year old gentleman with a recent hospital admission for  syncope.  NUCLEAR MEDICINE ADENOSINE STRESS MYOVIEW STUDY WITH SPECT AND LEFT VENTRIUCLAR EJECTION FRACTION  Radionuclide Data: One-day rest/stress protocol performed with 10/30 mCi of Tc-65m Myoview.  Stress Data: Regadenoson infusion resulted in no significant symptoms.  There was a modest increase in heart rate from a minimally tachycardic baseline level of 101 bpm and an slight increase in blood pressure.  No arrhythmias noted.  EKG: Normal sinus rhythm with frequent PVCs; right bundle branch block; delayed R-wave progression; possible prior lateral myocardial infarction; left anterior fascicular block.  No significant change with pharmacologic stress.  Scintigraphic Data: Imaging was performed with the patient's left arm at his side.  Left ventricular size was normal.  On tomographic images reconstructed in standard planes, there was thinning at the base of the inferior wall that was of borderline numeric significance and for which no reversibility was apparent.  The gated reconstruction demonstrated normal regional and global left ventricular systolic function with an estimated ejection fraction of 56%.  There was dyssynchronous wall motion at the base of the inferior myocardium.  Normal systolic accentuation of activity was noted throughout.  IMPRESSION: Negative pharmacologic stress nuclear myocardial study.   Original Report Authenticated By: [place] Bing

## 2012-03-16 ENCOUNTER — Encounter: Payer: Self-pay | Admitting: Cardiology

## 2012-03-16 NOTE — Assessment & Plan Note (Signed)
Currently receiving course of radiation therapy

## 2012-03-16 NOTE — Assessment & Plan Note (Signed)
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 Adequate course of anticoagulation has been completed. No further treatment required for this problem.

## 2012-03-16 NOTE — Assessment & Plan Note (Signed)
Patient has significant risk factors for thromboembolism, but in the absence of documented recurrent arrhythmia, I would continue to refrain from full anticoagulation.

## 2012-03-16 NOTE — Assessment & Plan Note (Signed)
Patient attributes loss of consciousness in hospital to pain related to gout. That process is now under relatively good control.

## 2012-03-16 NOTE — Assessment & Plan Note (Addendum)
FBGs obtained on an outpatient basis over the past few months range from 97-118. While measurement of a hemoglobin A1c level would be reassuring, it does not appear that pharmacologic therapy is warranted at present.

## 2012-03-16 NOTE — Assessment & Plan Note (Signed)
Etiology of syncope uncertain; no further testing warranted at present.

## 2012-03-16 NOTE — Assessment & Plan Note (Signed)
No recent assessment of lipids. Values in 2012 were somewhat suboptimal, but adequate in the absence of known coronary disease.  A repeat assessment would be useful and will be obtained.

## 2012-03-16 NOTE — Assessment & Plan Note (Signed)
Blood pressure control has been very good when assessed over the past 3 months. Current therapy will be continued.

## 2012-03-26 HISTORY — PX: CARDIAC CATHETERIZATION: SHX172

## 2012-04-07 ENCOUNTER — Encounter (HOSPITAL_COMMUNITY): Payer: Self-pay | Admitting: *Deleted

## 2012-04-07 ENCOUNTER — Emergency Department (HOSPITAL_COMMUNITY): Payer: Medicare Other

## 2012-04-07 ENCOUNTER — Other Ambulatory Visit: Payer: Self-pay

## 2012-04-07 ENCOUNTER — Inpatient Hospital Stay (HOSPITAL_COMMUNITY)
Admission: EM | Admit: 2012-04-07 | Discharge: 2012-04-10 | DRG: 282 | Disposition: A | Discharge status: Home / Self Care | Payer: Medicare Other | Attending: Cardiology | Admitting: Cardiology

## 2012-04-07 DIAGNOSIS — I4891 Unspecified atrial fibrillation: Secondary | ICD-10-CM | POA: Diagnosis present

## 2012-04-07 DIAGNOSIS — E785 Hyperlipidemia, unspecified: Secondary | ICD-10-CM | POA: Diagnosis present

## 2012-04-07 DIAGNOSIS — Z87891 Personal history of nicotine dependence: Secondary | ICD-10-CM

## 2012-04-07 DIAGNOSIS — Z8673 Personal history of transient ischemic attack (TIA), and cerebral infarction without residual deficits: Secondary | ICD-10-CM

## 2012-04-07 DIAGNOSIS — Z947 Corneal transplant status: Secondary | ICD-10-CM

## 2012-04-07 DIAGNOSIS — I10 Essential (primary) hypertension: Secondary | ICD-10-CM | POA: Diagnosis present

## 2012-04-07 DIAGNOSIS — Z79899 Other long term (current) drug therapy: Secondary | ICD-10-CM

## 2012-04-07 DIAGNOSIS — I2510 Atherosclerotic heart disease of native coronary artery without angina pectoris: Secondary | ICD-10-CM | POA: Diagnosis present

## 2012-04-07 DIAGNOSIS — C61 Malignant neoplasm of prostate: Secondary | ICD-10-CM | POA: Diagnosis present

## 2012-04-07 DIAGNOSIS — Z86718 Personal history of other venous thrombosis and embolism: Secondary | ICD-10-CM

## 2012-04-07 DIAGNOSIS — R079 Chest pain, unspecified: Secondary | ICD-10-CM

## 2012-04-07 DIAGNOSIS — Z96649 Presence of unspecified artificial hip joint: Secondary | ICD-10-CM

## 2012-04-07 DIAGNOSIS — M109 Gout, unspecified: Secondary | ICD-10-CM

## 2012-04-07 DIAGNOSIS — I214 Non-ST elevation (NSTEMI) myocardial infarction: Principal | ICD-10-CM | POA: Diagnosis present

## 2012-04-07 DIAGNOSIS — Z7982 Long term (current) use of aspirin: Secondary | ICD-10-CM

## 2012-04-07 DIAGNOSIS — Z923 Personal history of irradiation: Secondary | ICD-10-CM

## 2012-04-07 DIAGNOSIS — IMO0002 Reserved for concepts with insufficient information to code with codable children: Secondary | ICD-10-CM

## 2012-04-07 HISTORY — DX: Paroxysmal atrial fibrillation: I48.0

## 2012-04-07 HISTORY — DX: Atherosclerotic heart disease of native coronary artery without angina pectoris: I25.10

## 2012-04-07 LAB — CBC WITH DIFFERENTIAL/PLATELET
Eosinophils Absolute: 0 10*3/uL (ref 0.0–0.7)
Hemoglobin: 12.8 g/dL — ABNORMAL LOW (ref 13.0–17.0)
Lymphocytes Relative: 5 % — ABNORMAL LOW (ref 12–46)
Lymphs Abs: 0.6 10*3/uL — ABNORMAL LOW (ref 0.7–4.0)
MCH: 34.5 pg — ABNORMAL HIGH (ref 26.0–34.0)
MCV: 103 fL — ABNORMAL HIGH (ref 78.0–100.0)
Monocytes Relative: 6 % (ref 3–12)
Neutrophils Relative %: 89 % — ABNORMAL HIGH (ref 43–77)
RBC: 3.71 MIL/uL — ABNORMAL LOW (ref 4.22–5.81)

## 2012-04-07 LAB — COMPREHENSIVE METABOLIC PANEL
Alkaline Phosphatase: 135 U/L — ABNORMAL HIGH (ref 39–117)
BUN: 24 mg/dL — ABNORMAL HIGH (ref 6–23)
CO2: 22 mEq/L (ref 19–32)
GFR calc Af Amer: >90 mL/min (ref >90)
GFR calc non Af Amer: 78 mL/min — ABNORMAL LOW (ref >90)
Glucose, Bld: 278 mg/dL — ABNORMAL HIGH (ref 70–99)
Potassium: 3.6 mEq/L (ref 3.5–5.1)
Total Bilirubin: 0.3 mg/dL (ref 0.3–1.2)
Total Protein: 6.5 g/dL (ref 6.0–8.3)

## 2012-04-07 LAB — URINALYSIS, ROUTINE W REFLEX MICROSCOPIC
Bilirubin Urine: NEGATIVE
Ketones, ur: NEGATIVE mg/dL
Leukocytes, UA: NEGATIVE
Nitrite: POSITIVE — AB
Protein, ur: NEGATIVE mg/dL
Urobilinogen, UA: 1 mg/dL (ref 0.0–1.0)

## 2012-04-07 LAB — TROPONIN I
Troponin I: 2.47 ng/mL (ref <0.30)
Troponin I: 3.81 ng/mL (ref <0.30)

## 2012-04-07 MED ORDER — ONDANSETRON HCL 4 MG/2ML IJ SOLN
4.0000 mg | Freq: Once | INTRAMUSCULAR | Status: AC
  Administered 2012-04-07: 4 mg via INTRAVENOUS
  Filled 2012-04-07: qty 2

## 2012-04-07 MED ORDER — SODIUM CHLORIDE 0.9 % IV SOLN
1000.0000 mL | Freq: Once | INTRAVENOUS | Status: AC
  Administered 2012-04-07: 1000 mL via INTRAVENOUS

## 2012-04-07 MED ORDER — MORPHINE SULFATE 4 MG/ML IJ SOLN
4.0000 mg | Freq: Once | INTRAMUSCULAR | Status: AC
  Administered 2012-04-07: 4 mg via INTRAVENOUS
  Filled 2012-04-07: qty 1

## 2012-04-07 MED ORDER — SODIUM CHLORIDE 0.9 % IV SOLN
1000.0000 mL | INTRAVENOUS | Status: DC
  Administered 2012-04-07: 1000 mL via INTRAVENOUS

## 2012-04-07 NOTE — ED Provider Notes (Signed)
History  This chart was scribed for Ward Givens, MD, by Candelaria Stagers, ED Scribe. This patient was seen in room APA09/APA09 and the patient's care was started at 5:34 PM   CSN: [id number]  Arrival date & time 04/07/12  1723   None     Chief Complaint  Patient presents with  . Chest Pain  . Nausea     The history is provided by the patient. No language interpreter was used.   [name] is a 79 y.o. male who presents to the Emergency Department complaining of mild chest pain that started earlier today after he returned home from the cancer center in [place].  Pt reports he goes to the cancer center everyday for radiation treatment of prostate cancer.  He has been receiving treatment for  35 of 40 days.  Pt did receive his treatment today.  He denies SOB or diaphoresis.  Pt is also experiencing abdominal discomfort, nausea, and vomiting.  Pt has no h/o heart problems, except for a fib and has had a stress test performed by Dr. Dietrich Pates which was normal.  He denies similar chest pain in the past.  Pt lives with his wife.  Pt uses a walker at baseline.  Pt takes 81 mg aspirin daily.  Pt also reports that yesterday he was experiencing trouble urinating and penile pain and was given medication  (?Lasix) and urinated all night.  He states that the difficulty urinating and penile pain have now subsided.   He states his upper abdomen feel bloated. He feels weak and dizzy when he stands. He rates his pain as "mild". He is unable to characterize the pain except as "pain".    PCP Dr. Regino Schultze.   Cardiology Dr Dietrich Pates    Past Medical History  Diagnosis Date  . Hypertension   . Gout     Acute episode involving ankle in 12/2011  . Hyperlipidemia   . Deep vein thrombophlebitis of left leg 11/2010    LLE; S/P THA  . Fasting hyperglycemia   . Stroke 08/2009; 07/2010    TIA in 07/2010  . H/O heat stroke 08/2009  . Prostate cancer 2013    adenocarcinoma; Gleason grade 8; PSA 9.45; EBRT 2013-14  .  Renal cyst   . Syncope     01/2012: Negative pharmacologic stress nuclear  . Atrial fibrillation 01/2012    Asymptomatic; identified on telemetry during admission for a URI    Past Surgical History  Procedure Laterality Date  . Hip arthroplasty  12/08/2010    ARTHROPLASTY BIPOLAR HIP;  Surgeon-Olin; Left  . 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 I&d extremity  05/28/2011    Procedure: IRRIGATION AND DEBRIDEMENT EXTREMITY;  Surgeon: Sharma Covert, MD;  Location: [facility] OR;  Service: Orthopedics;  Laterality: Right;  I & D Right hand/wrist  . Colonoscopy   07/18/2003    RMR: Rectum internal hemorrhoids.  Otherwise, normal rectum/ Left-sided diverticula.  Remainder of colonic mucosa-nl    Family History  Problem Relation Age of Onset  . Prostate cancer Brother   . Colon cancer Sister 65  . Diabetes Mellitus II Father   . Stroke Father     History  Substance Use Topics  . Smoking status: Former Smoker    Types: Cigarettes, Cigars    Quit date: 01/27/1948  . Smokeless tobacco: Current User    Types: Chew     Comment: "quit  smoking cigarettes age 66 or 42"  . Alcohol Use: 0.5 oz/week    1 drink(s) per week   Lives at home Lives with spouse   Review of Systems  Constitutional: Negative for diaphoresis.  Respiratory: Negative for shortness of breath.   Cardiovascular: Positive for chest pain.  Gastrointestinal: Positive for nausea, vomiting and abdominal pain. Negative for blood in stool.    Allergies  Tetanus toxoids  Home Medications   Current Outpatient Rx  Name  Route  Sig  Dispense  Refill  . alendronate (FOSAMAX) 70 MG tablet   Oral   Take 70 mg by mouth every 7 (seven) days.          Marland Kitchen allopurinol (ZYLOPRIM) 100 MG tablet   Oral   Take 1 tablet (100 mg total) by mouth daily.   30 tablet   0   . amLODipine (NORVASC) 5 MG tablet   Oral   Take 5 mg by mouth daily.          Marland Kitchen ascorbic acid (VITAMIN C) 500 MG tablet    Oral   Take 500 mg by mouth every morning.          Marland Kitchen aspirin EC 81 MG tablet   Oral   Take 81 mg by mouth daily.         . calcium-vitamin D (OSCAL WITH D) 500-200 MG-UNIT per tablet   Oral   Take 1 tablet by mouth every morning.          . cholecalciferol (VITAMIN D) 1000 UNITS tablet   Oral   Take 1,000 Units by mouth daily.         . COD LIVER OIL PO   Oral   Take by mouth daily.         . enalapril (VASOTEC) 5 MG tablet   Oral   Take 2.5 mg by mouth every morning.          . fish oil-omega-3 fatty acids 1000 MG capsule   Oral   Take 1 g by mouth daily.         Marland Kitchen glipiZIDE (GLUCOTROL XL) 2.5 MG 24 hr tablet   Oral   Take 2.5 mg by mouth daily.         . metoprolol (LOPRESSOR) 50 MG tablet   Oral   Take 50 mg by mouth 2 (two) times daily.           . Multiple Vitamin (MULITIVITAMIN WITH MINERALS) TABS   Oral   Take 1 tablet by mouth every morning.          . predniSONE (DELTASONE) 10 MG tablet      Take 4 tablets orally daily for 2 days, then, Take 3 tablets orally daily for 2 days, then, Take 2 tablets orally daily for 2 days, then,  Take 1 tablet orally daily for 1 day and then stop   19 tablet   0   . simvastatin (ZOCOR) 20 MG tablet   Oral   Take 20 mg by mouth at bedtime.          . vitamin E 400 UNIT capsule   Oral   Take 400 Units by mouth every morning.            BP 124/67  Pulse 116  Temp(Src) 97.9 F (36.6 C) (Oral)  Resp 20  SpO2 100%  Vital signs normal except tachycardia   Physical Exam  Nursing note and vitals reviewed. Constitutional: He  is oriented to person, place, and time. He appears well-developed and well-nourished. No distress.  Appears uncomfortable.  Keeps eyes closed.  Color pale.   HENT:  Head: Normocephalic and atraumatic.  Right Ear: External ear normal.  Left Ear: External ear normal.  Nose: Nose normal.  Mouth/Throat: Oropharynx is clear and moist.  Eyes: Conjunctivae and EOM are  normal. Pupils are equal, round, and reactive to light.  Neck: Normal range of motion. Neck supple.  Cardiovascular: An irregular rhythm present. Tachycardia present.   Atrial fib   Pulmonary/Chest: Effort normal and breath sounds normal. No respiratory distress. He has no wheezes. He has no rales.  Abdominal: He exhibits distension.  Musculoskeletal: He exhibits edema.  2+ pitting edema of right lower leg which appears larger than left leg.  Left leg with 1+ edema.     Neurological: He is alert and oriented to person, place, and time.  Skin: Skin is warm and dry. He is not diaphoretic.  Psychiatric: He has a normal mood and affect.    ED Course  Procedures   Medications  0.9 %  sodium chloride infusion (0 mLs Intravenous Stopped 04/07/12 2032)    Followed by  0.9 %  sodium chloride infusion (1,000 mLs Intravenous New Bag/Given 04/07/12 1759)  ondansetron (ZOFRAN) injection 4 mg (4 mg Intravenous Given 04/07/12 1801)  morphine 4 MG/ML injection 4 mg (4 mg Intravenous Given 04/07/12 1801)  heparin ordered.    DIAGNOSTIC STUDIES: Oxygen Saturation is 100% on room air, normal by my interpretation.    COORDINATION OF CARE:  5:47 PM Discussed course of care with pt which includes pain medication and chest xray.  Pt understands and agrees.   6:46 PM Recheck: His relative states pt called him around 4:00PM stating he was experiencing chest pain. Pt hallucinating after Morphine, reaching into the air for water.   8:34 PM Relative states he believes pt's stomach appears to be swelling and noticed it starting today. Pt and relatives updated about second round of blood work that showed pt had a heart attack earlier today during the chest pain episode.  Pt will be admitted to [facility] tonight.   Pt had myoview stress test done on Feb 11, 2012 that was normal. He had a CT AP in Oct and a MR of Abdomen in Nov that did not reveal a AAA.  20:44 Dr Shirlee Latch, [facility], wants to repeat the  troponin since his pain is gone, if it isn't getting higher can stay here and see Dr Dietrich Pates tomorrow.  22:28 discuss with Dr. Shirlee Latch again. Patient remains pain-free. He feels patient can stay here and be evaluated by Dr. Dietrich Pates in the morning. He recommends starting on a heparin drip. Patient and his brothers informed.  23:41 Dr Karilyn Cota, wants patient to be admitted to cardiology at [facility].   23:47 Dr Shirlee Latch feels nothing is going to be done tonight if pt transferred to [facility], wants Dr Karilyn Cota to call him.   00:23 Dr Feliz Beam advised Dr Shirlee Latch wants to talk to him  00:40 Dr Shirlee Latch is going to admit to [facility] telemetery   Results for orders placed during the hospital encounter of 04/07/12  CBC WITH DIFFERENTIAL      Result Value Range   WBC 11.6 (*) 4.0 - 10.5 K/uL   RBC 3.71 (*) 4.22 - 5.81 MIL/uL   Hemoglobin 12.8 (*) 13.0 - 17.0 g/dL   HCT 09.8 (*) 11.9 - 14.7 %   MCV 103.0 (*) 78.0 - 100.0  fL   MCH 34.5 (*) 26.0 - 34.0 pg   MCHC 33.5  30.0 - 36.0 g/dL   RDW 21.3 (*) 08.6 - 57.8 %   Platelets 161  150 - 400 K/uL   Neutrophils Relative 89 (*) 43 - 77 %   Neutro Abs 10.3 (*) 1.7 - 7.7 K/uL   Lymphocytes Relative 5 (*) 12 - 46 %   Lymphs Abs 0.6 (*) 0.7 - 4.0 K/uL   Monocytes Relative 6  3 - 12 %   Monocytes Absolute 0.7  0.1 - 1.0 K/uL   Eosinophils Relative 0  0 - 5 %   Eosinophils Absolute 0.0  0.0 - 0.7 K/uL   Basophils Relative 0  0 - 1 %   Basophils Absolute 0.0  0.0 - 0.1 K/uL  COMPREHENSIVE METABOLIC PANEL      Result Value Range   Sodium 135  135 - 145 mEq/L   Potassium 3.6  3.5 - 5.1 mEq/L   Chloride 97  96 - 112 mEq/L   CO2 22  19 - 32 mEq/L   Glucose, Bld 278 (*) 70 - 99 mg/dL   BUN 24 (*) 6 - 23 mg/dL   Creatinine, Ser 4.69  0.50 - 1.35 mg/dL   Calcium 9.5  8.4 - 62.9 mg/dL   Total Protein 6.5  6.0 - 8.3 g/dL   Albumin 3.3 (*) 3.5 - 5.2 g/dL   AST 24  0 - 37 U/L   ALT 31  0 - 53 U/L   Alkaline Phosphatase 135 (*) 39 - 117 U/L   Total Bilirubin 0.3  0.3 - 1.2  mg/dL   GFR calc non Af Amer 78 (*) >90 mL/min   GFR calc Af Amer >90  >90 mL/min  URINALYSIS, ROUTINE W REFLEX MICROSCOPIC      Result Value Range   Color, Urine ORANGE (*) YELLOW   APPearance CLEAR  CLEAR   Specific Gravity, Urine <1.005 (*) 1.005 - 1.030   pH 5.0  5.0 - 8.0   Glucose, UA 500 (*) NEGATIVE mg/dL   Hgb urine dipstick NEGATIVE  NEGATIVE   Bilirubin Urine NEGATIVE  NEGATIVE   Ketones, ur NEGATIVE  NEGATIVE mg/dL   Protein, ur NEGATIVE  NEGATIVE mg/dL   Urobilinogen, UA 1.0  0.0 - 1.0 mg/dL   Nitrite POSITIVE (*) NEGATIVE   Leukocytes, UA NEGATIVE  NEGATIVE  TROPONIN I      Result Value Range   Troponin I <0.30  <0.30 ng/mL  LIPASE, BLOOD      Result Value Range   Lipase 28  11 - 59 U/L  URINE MICROSCOPIC-ADD ON      Result Value Range   Squamous Epithelial / LPF RARE  RARE   WBC, UA 0-2  <3 WBC/hpf   Bacteria, UA FEW (*) RARE  TROPONIN I      Result Value Range   Troponin I 2.47 (*) <0.30 ng/mL  TROPONIN I      Result Value Range   Troponin I 3.81 (*) <0.30 ng/mL   Dg Chest Portable 1 View  04/07/2012  *RADIOLOGY REPORT*  Clinical Data: Chest pain, nausea  CHEST - 1 VIEW  Comparison:  02/07/2012  Findings: The heart size and mediastinal contours are within normal limits.  Both lungs are clear.  IMPRESSION: No active disease.   Original Report Authenticated By: Judie Petit. Miles Costain, M.D.       Date: 04/07/2012  Rate: 129  Rhythm: atrial fibrillation  QRS Axis: normal  Intervals: normal  ST/T Wave abnormalities: normal  Conduction Disutrbances:right bundle branch block and left anterior fascicular block  Narrative Interpretation:   Old EKG Reviewed: none available  #2  Date: 04/07/2012  Rate: 97  Rhythm: atrial fibrillation  QRS Axis: normal  Intervals: normal  ST/T Wave abnormalities: normal  Conduction Disutrbances:right bundle branch block, LAFB  Narrative Interpretation:   Old EKG Reviewed: unchanged from earlier tonight    1. NSTEMI (non-ST  elevated myocardial infarction)   2. Atrial fibrillation, controlled     Plan transfer to [facility] for admission  Devoria Albe, MD, FACEP  CRITICAL CARE Performed by: Devoria Albe L   Total critical care time: 42 min   Critical care time was exclusive of separately billable procedures and treating other patients.  Critical care was necessary to treat or prevent imminent or life-threatening deterioration.  Critical care was time spent personally by me on the following activities: development of treatment plan with patient and/or surrogate as well as nursing, discussions with consultants, evaluation of patient's response to treatment, examination of patient, obtaining history from patient or surrogate, ordering and performing treatments and interventions, ordering and review of laboratory studies, ordering and review of radiographic studies, pulse oximetry and re-evaluation of patient's condition.  MDM   I personally performed the services described in this documentation, which was scribed in my presence. The recorded information has been reviewed and considered.  Devoria Albe, MD, FACEP         Ward Givens, MD 04/08/12 [phone]

## 2012-04-07 NOTE — ED Notes (Signed)
CRITICAL VALUE ALERT  Critical value received:  Troponin 2.47  Date of notification:  04/07/2012  Time of notification:  2020  Critical value read back:yes  Nurse who received alert:  Wilkie Aye belton,rn  MD notified (1st page):  Dr Lynelle Doctor  Time of first page:  2020  MD notified (2nd page):  Time of second page:  Responding MD:  Dr Lynelle Doctor  Time MD responded:  2020

## 2012-04-07 NOTE — ED Notes (Signed)
Bladder scan results - 

## 2012-04-07 NOTE — ED Notes (Signed)
AC retrieving bladder scanner

## 2012-04-07 NOTE — ED Notes (Signed)
At cancer treatment began having chest pain.  Has not been feeling well since 1400.  Generalized chest pain and abdominal pain.  Has taken medication for indigestion which he said helped

## 2012-04-08 ENCOUNTER — Encounter (HOSPITAL_COMMUNITY)
Admission: EM | Disposition: A | Discharge status: Home / Self Care | Payer: Self-pay | Source: Home / Self Care | Attending: Cardiology

## 2012-04-08 DIAGNOSIS — I2510 Atherosclerotic heart disease of native coronary artery without angina pectoris: Secondary | ICD-10-CM

## 2012-04-08 DIAGNOSIS — M109 Gout, unspecified: Secondary | ICD-10-CM

## 2012-04-08 DIAGNOSIS — I517 Cardiomegaly: Secondary | ICD-10-CM

## 2012-04-08 DIAGNOSIS — I10 Essential (primary) hypertension: Secondary | ICD-10-CM

## 2012-04-08 DIAGNOSIS — I4891 Unspecified atrial fibrillation: Secondary | ICD-10-CM

## 2012-04-08 DIAGNOSIS — I214 Non-ST elevation (NSTEMI) myocardial infarction: Secondary | ICD-10-CM

## 2012-04-08 DIAGNOSIS — R079 Chest pain, unspecified: Secondary | ICD-10-CM

## 2012-04-08 HISTORY — PX: LEFT HEART CATHETERIZATION WITH CORONARY ANGIOGRAM: SHX5451

## 2012-04-08 LAB — CBC
HCT: 33.5 % — ABNORMAL LOW (ref 39.0–52.0)
MCHC: 34.3 g/dL (ref 30.0–36.0)
MCV: 100.3 fL — ABNORMAL HIGH (ref 78.0–100.0)
RDW: 15.9 % — ABNORMAL HIGH (ref 11.5–15.5)

## 2012-04-08 LAB — GLUCOSE, CAPILLARY: Glucose-Capillary: 99 mg/dL (ref 70–99)

## 2012-04-08 LAB — HEPARIN LEVEL (UNFRACTIONATED): Heparin Unfractionated: 0.46 IU/mL (ref 0.30–0.70)

## 2012-04-08 LAB — BASIC METABOLIC PANEL
CO2: 26 mEq/L (ref 19–32)
Calcium: 8.9 mg/dL (ref 8.4–10.5)
Creatinine, Ser: 0.93 mg/dL (ref 0.50–1.35)
Glucose, Bld: 76 mg/dL (ref 70–99)

## 2012-04-08 LAB — TROPONIN I: Troponin I: 4.79 ng/mL (ref <0.30)

## 2012-04-08 SURGERY — LEFT HEART CATHETERIZATION WITH CORONARY ANGIOGRAM
Anesthesia: LOCAL

## 2012-04-08 MED ORDER — ASPIRIN 81 MG PO CHEW
81.0000 mg | CHEWABLE_TABLET | Freq: Every day | ORAL | Status: DC
  Administered 2012-04-09 – 2012-04-10 (×2): 81 mg via ORAL
  Filled 2012-04-08 (×2): qty 1

## 2012-04-08 MED ORDER — INSULIN ASPART 100 UNIT/ML ~~LOC~~ SOLN: 0.0000 [IU] | Freq: Three times a day (TID) | SUBCUTANEOUS | Status: DC

## 2012-04-08 MED ORDER — ACETAMINOPHEN 325 MG PO TABS: 650.0000 mg | ORAL_TABLET | ORAL | Status: DC | PRN

## 2012-04-08 MED ORDER — SIMVASTATIN 20 MG PO TABS
20.0000 mg | ORAL_TABLET | Freq: Every day | ORAL | Status: DC
  Filled 2012-04-08 (×2): qty 1

## 2012-04-08 MED ORDER — HEPARIN BOLUS VIA INFUSION
4000.0000 [IU] | Freq: Once | INTRAVENOUS | Status: AC
  Administered 2012-04-08: 4000 [IU] via INTRAVENOUS

## 2012-04-08 MED ORDER — ATORVASTATIN CALCIUM 80 MG PO TABS
80.0000 mg | ORAL_TABLET | Freq: Every day | ORAL | Status: DC
  Administered 2012-04-08: 80 mg via ORAL
  Filled 2012-04-08 (×2): qty 1

## 2012-04-08 MED ORDER — SODIUM CHLORIDE 0.9 % IV SOLN: 250.0000 mL | INTRAVENOUS | Status: DC | PRN

## 2012-04-08 MED ORDER — ASPIRIN EC 81 MG PO TBEC
81.0000 mg | DELAYED_RELEASE_TABLET | Freq: Every day | ORAL | Status: DC
  Filled 2012-04-08 (×2): qty 1

## 2012-04-08 MED ORDER — SODIUM CHLORIDE 0.9 % IJ SOLN: 3.0000 mL | Freq: Two times a day (BID) | INTRAMUSCULAR | Status: DC

## 2012-04-08 MED ORDER — GLIPIZIDE ER 2.5 MG PO TB24
2.5000 mg | ORAL_TABLET | Freq: Every day | ORAL | Status: DC
  Administered 2012-04-09 – 2012-04-10 (×2): 2.5 mg via ORAL
  Filled 2012-04-08 (×4): qty 1

## 2012-04-08 MED ORDER — VITAMIN D3 25 MCG (1000 UNIT) PO TABS
1000.0000 [IU] | ORAL_TABLET | Freq: Every day | ORAL | Status: DC
  Filled 2012-04-08 (×2): qty 1

## 2012-04-08 MED ORDER — HEPARIN (PORCINE) IN NACL 2-0.9 UNIT/ML-% IJ SOLN
INTRAMUSCULAR | Status: AC
  Filled 2012-04-08: qty 1500

## 2012-04-08 MED ORDER — ONDANSETRON HCL 4 MG/2ML IJ SOLN: 4.0000 mg | Freq: Four times a day (QID) | INTRAMUSCULAR | Status: DC | PRN

## 2012-04-08 MED ORDER — HEPARIN (PORCINE) IN NACL 100-0.45 UNIT/ML-% IJ SOLN
1350.0000 [IU]/h | INTRAMUSCULAR | Status: DC
  Administered 2012-04-08: 1350 [IU]/h via INTRAVENOUS
  Filled 2012-04-08 (×2): qty 250

## 2012-04-08 MED ORDER — SODIUM CHLORIDE 0.9 % IJ SOLN: 3.0000 mL | INTRAMUSCULAR | Status: DC | PRN

## 2012-04-08 MED ORDER — INSULIN ASPART 100 UNIT/ML ~~LOC~~ SOLN: 0.0000 [IU] | Freq: Every day | SUBCUTANEOUS | Status: DC

## 2012-04-08 MED ORDER — ENALAPRIL MALEATE 2.5 MG PO TABS
2.5000 mg | ORAL_TABLET | Freq: Every morning | ORAL | Status: DC
  Administered 2012-04-08 – 2012-04-10 (×3): 2.5 mg via ORAL
  Filled 2012-04-08 (×5): qty 1

## 2012-04-08 MED ORDER — ALLOPURINOL 100 MG PO TABS
100.0000 mg | ORAL_TABLET | Freq: Every day | ORAL | Status: DC
  Administered 2012-04-08 – 2012-04-10 (×3): 100 mg via ORAL
  Filled 2012-04-08 (×5): qty 1

## 2012-04-08 MED ORDER — SODIUM CHLORIDE 0.9 % IV SOLN: INTRAVENOUS | Status: AC

## 2012-04-08 MED ORDER — INDOMETHACIN 25 MG PO CAPS: 25.0000 mg | ORAL_CAPSULE | Freq: Three times a day (TID) | ORAL | Status: DC

## 2012-04-08 MED ORDER — TAMSULOSIN HCL 0.4 MG PO CAPS
0.4000 mg | ORAL_CAPSULE | Freq: Every day | ORAL | Status: DC
  Administered 2012-04-08 – 2012-04-10 (×3): 0.4 mg via ORAL
  Filled 2012-04-08 (×5): qty 1

## 2012-04-08 MED ORDER — ASPIRIN 81 MG PO CHEW
324.0000 mg | CHEWABLE_TABLET | ORAL | Status: AC
  Administered 2012-04-08: 324 mg via ORAL
  Filled 2012-04-08: qty 4

## 2012-04-08 MED ORDER — LIDOCAINE HCL (PF) 1 % IJ SOLN
INTRAMUSCULAR | Status: AC
  Filled 2012-04-08: qty 30

## 2012-04-08 MED ORDER — OMEGA-3 FATTY ACIDS 1000 MG PO CAPS: 1.0000 g | ORAL_CAPSULE | Freq: Every day | ORAL | Status: DC

## 2012-04-08 MED ORDER — MORPHINE SULFATE 2 MG/ML IJ SOLN: 2.0000 mg | INTRAMUSCULAR | Status: DC | PRN

## 2012-04-08 MED ORDER — SODIUM CHLORIDE 0.9 % IV SOLN
1.0000 mL/kg/h | INTRAVENOUS | Status: DC
  Administered 2012-04-08: 1 mL/kg/h via INTRAVENOUS

## 2012-04-08 MED ORDER — ALLOPURINOL 300 MG PO TABS
300.0000 mg | ORAL_TABLET | Freq: Every day | ORAL | Status: DC
  Filled 2012-04-08 (×2): qty 1

## 2012-04-08 MED ORDER — AMLODIPINE BESYLATE 5 MG PO TABS
5.0000 mg | ORAL_TABLET | Freq: Every day | ORAL | Status: DC
  Administered 2012-04-08 – 2012-04-09 (×2): 5 mg via ORAL
  Filled 2012-04-08 (×2): qty 1

## 2012-04-08 MED ORDER — CALCIUM CARBONATE-VITAMIN D 500-200 MG-UNIT PO TABS
1.0000 | ORAL_TABLET | Freq: Every morning | ORAL | Status: DC
  Filled 2012-04-08 (×2): qty 1

## 2012-04-08 MED ORDER — METOPROLOL TARTRATE 50 MG PO TABS
50.0000 mg | ORAL_TABLET | Freq: Two times a day (BID) | ORAL | Status: DC
  Administered 2012-04-08 – 2012-04-10 (×5): 50 mg via ORAL
  Filled 2012-04-08 (×6): qty 1

## 2012-04-08 MED ORDER — ALENDRONATE SODIUM 70 MG PO TABS: 70.0000 mg | ORAL_TABLET | ORAL | Status: DC

## 2012-04-08 NOTE — Progress Notes (Signed)
Inpatient Diabetes Program Recommendations  AACE/ADA: New Consensus Statement on Inpatient Glycemic Control (2013)  Target Ranges:  Prepandial:   less than 140 mg/dL      Peak postprandial:   less than 180 mg/dL (1-2 hours)      Critically ill patients:  140 - 180 mg/dL   Reason for Assessmemt: Elevated lab glucose yesterday of 278 mg/dl  Inpatient Diabetes Program Recommendations Correction (SSI): Currently on resistant correction scale tid and HS correction Oral Agents: Currently on home dose of Glucotrol 2.5 mg daily. HgbA1C: Last known Hgb A1C was in 2012.  Request MD order for Hgb A1C.  Note:  Results for [name], [name] (MRN [id number]) as of 04/08/2012 11:10  Ref. Range 04/08/2012 05:27  Glucose-Capillary Latest Range: 70-99 mg/dL 83   Results for [name], [name] (MRN [id number]) as of 04/08/2012 11:10  Ref. Range 04/07/2012 17:30 04/08/2012 07:10  Glucose Latest Range: 70-99 mg/dL 782 (H) 76   Lab glucose much improved today.  Note that patient was on Prednisone prior to admission-- was this for recent gout flare?  No Prednisone ordered now.    [name] home Glucotrol is ordered plus resistant correction scale tid with HS scale tid.  Request MD consider the following: Stop Glucotrol while in the hospital due to risk of hypoglycemia in the hospital setting  Change correction to sensitive or moderate tid with meals and continue HS correction-- unless patient will be receiving steroids  Order a Hgb A1C  Add a diagnosis of diabetes to the Hospital Problem List.  Thank you.  Patti S. Elsie Lincoln, RN, CNS, CDE Inpatient Diabetes Program, team pager [phone]

## 2012-04-08 NOTE — ED Notes (Signed)
Patient states he does not want a cardiac cath done.  Patient has agreed to receiving Heparin and has given consent for transfer to [facility].

## 2012-04-08 NOTE — Progress Notes (Signed)
ANTICOAGULATION CONSULT NOTE - Initial Consult  Pharmacy Consult for Heparin Indication: chest pain/ACS  Allergies  Allergen Reactions  . Tetanus Toxoids Swelling    Also allergic to "high powered pain medications."    Patient Measurements: Height: 5\' 9"  (175.3 cm) Weight: 240 lb (108.863 kg) IBW/kg (Calculated) : 70.7 Heparin Dosing Weight: 97 kg  Vital Signs: Temp: 97.9 F (36.6 C) (03/13 1735) Temp src: Oral (03/13 1735) BP: 124/80 mmHg (03/13 2239) Pulse Rate: 94 (03/13 2239)  Labs:  Recent Labs  04/07/12 1730 04/07/12 1930 04/07/12 2116  HGB 12.8*  --   --   HCT 38.2*  --   --   PLT 161  --   --   CREATININE 0.95  --   --   TROPONINI <0.30 2.47* 3.81*    Estimated Creatinine Clearance: 78 ml/min (by C-G formula based on Cr of 0.95).   Medical History: Past Medical History  Diagnosis Date  . Hypertension   . Gout     Acute episode involving ankle in 12/2011  . Hyperlipidemia   . Deep vein thrombophlebitis of left leg 11/2010    LLE; S/P THA  . Fasting hyperglycemia   . Stroke 08/2009; 07/2010    TIA in 07/2010  . H/O heat stroke 08/2009  . Prostate cancer 2013    adenocarcinoma; Gleason grade 8; PSA 9.45; EBRT 2013-14  . Renal cyst   . Syncope     01/2012: Negative pharmacologic stress nuclear  . Atrial fibrillation 01/2012    Asymptomatic; identified on telemetry during admission for a URI    Medications:  Scheduled:  . alendronate  70 mg Oral Q7 days  . allopurinol  100 mg Oral Daily  . allopurinol  300 mg Oral Daily  . amLODipine  5 mg Oral Daily  . aspirin EC  81 mg Oral Daily  . calcium-vitamin D  1 tablet Oral q morning - 10a  . cholecalciferol  1,000 Units Oral Daily  . enalapril  2.5 mg Oral q morning - 10a  . fish oil-omega-3 fatty acids  1 g Oral Daily  . glipiZIDE  2.5 mg Oral Daily  . indomethacin  25 mg Oral TID  . insulin aspart  0-20 Units Subcutaneous TID WC  . insulin aspart  0-5 Units Subcutaneous QHS  . metoprolol  50 mg  Oral BID  . [COMPLETED] morphine  4 mg Intravenous Once  . [COMPLETED] ondansetron  4 mg Intravenous Once  . simvastatin  20 mg Oral QHS  . sodium chloride  3 mL Intravenous Q12H  . tamsulosin  0.4 mg Oral Daily    Assessment: Okay for Protocol Hx VTE but currently off Warfarin. PT/INR pending.  Goal of Therapy:  Heparin level 0.3-0.7 units/ml Monitor platelets by anticoagulation protocol: Yes   Plan:  Give 4000 units bolus x 1 Start heparin infusion at 1350 units/hr Check anti-Xa level in 6-8 hours and daily while on heparin Continue to monitor H&H and platelets  Mady Gemma 04/08/2012,1:09 AM

## 2012-04-08 NOTE — ED Notes (Signed)
Report given to [facility] with [facility].

## 2012-04-08 NOTE — Progress Notes (Signed)
ANTICOAGULATION CONSULT NOTE - Follow Up Consult  Pharmacy Consult for Heparin Indication: chest pain/ACS  Allergies  Allergen Reactions  . Tetanus Toxoids Swelling    Also allergic to "high powered pain medications."    Patient Measurements: Height: 5\' 9"  (175.3 cm) Weight: 224 lb 10.4 oz (101.9 kg) IBW/kg (Calculated) : 70.7 Heparin Dosing Weight: 92kg  Vital Signs: Temp: 97.9 F (36.6 C) (03/14 0336) Temp src: Oral (03/14 0336) BP: 122/65 mmHg (03/14 0336) Pulse Rate: 92 (03/14 0336)  Labs:  Recent Labs  04/07/12 1730 04/07/12 1930 04/07/12 2116 04/08/12 0059 04/08/12 0710  HGB 12.8*  --   --   --  11.5*  HCT 38.2*  --   --   --  33.5*  PLT 161  --   --   --  127*  LABPROT  --   --   --  11.7  --   INR  --   --   --  0.86  --   HEPARINUNFRC  --   --   --   --  0.46  CREATININE 0.95  --   --   --   --   TROPONINI <0.30 2.47* 3.81*  --   --     Estimated Creatinine Clearance: 75.4 ml/min (by C-G formula based on Cr of 0.95).   Medications:  Heparin @ 1350 units/hr  Assessment: 80yom continues on heparin for CP with positive enzymes. Initial heparin level is therapeutic at 0.46. Hgb/Hct/Plts trending down but stable. No bleeding noted. For cath today.  Goal of Therapy:  Heparin level 0.3-0.7 units/ml Monitor platelets by anticoagulation protocol: Yes   Plan:  1) Continue heparin at 1350 units/hr 2) Follow up after cath  Fredrik Rigger 04/08/2012,8:12 AM

## 2012-04-08 NOTE — ED Notes (Signed)
[facility] here to transport to [facility].

## 2012-04-08 NOTE — Progress Notes (Signed)
Critical troponin called from main lab Trop 4.79 Result called to [facility], no new orders received, pt resting and chest pain free, VSS, on cath lab schedule for 1030 today. Will continue to monitor closely. Ave Filter

## 2012-04-08 NOTE — CV Procedure (Addendum)
  Cardiac Catheterization Procedure Note  Name: BENIAH MAGNAN MRN: [id number] DOB: 13-Dec-1931  Procedure: Left Heart Cath, Selective Coronary Angiography, LV angiography  Indication:  [id number]  Procedural details: The right groin was prepped, draped, and anesthetized with 1% lidocaine. Using modified Seldinger technique, a 5 Lingle sheath was introduced into the right femoral artery. Standard Judkins catheters were used for coronary angiography and left ventriculography. Catheter exchanges were performed over a guidewire. There were no immediate procedural complications. The patient was transferred to the post catheterization recovery area for further monitoring.  Procedural Findings:  Hemodynamics:     AO 124/72    LV 125/8   Coronary angiography:   Coronary dominance: Right  Left mainstem:   Long 50% stenosis with heavy calcification.    Left anterior descending (LAD):   Proximal LAD with 50% stenosis.  Proximal to mid long 50% stenosis. Mid to distal long 30% stenosis.  Mid diagonal moderate sized and normal.   Left circumflex (LCx):  AV groove normal.  Large branching RI. Mild luminal irregularities.  Superior branch with ostial 60% stenosis.  Small PLs x 2 normal.  Right coronary artery (RCA):  Small.  Diffuse mild nonobstructive plaque  Left ventriculography: Left ventricular systolic function is normal, LVEF is estimated at 55%, there is no significant mitral regurgitation   Final Conclusions:  Non obstructive CAD.  Preserved EF.    Recommendations:   Medical management.   I did discuss the case with Dr. Shirlee Latch.  We need to rule out PE.  I would order a VQ scan once bedrest is up.   [name] 04/08/2012, 2:01 PM

## 2012-04-08 NOTE — H&P (Signed)
Physician History and Physical    [name] MRN: [id number] DOB/AGE: 1931/05/14 79 y.o. Admit date: 04/07/2012  Primary Care Physician: McGough Primary Cardiologist: Dietrich Pates  HPI:  79 yo with history of PAF and prostate cancer presented to [facility] ER tonight with chest pain.  He was recently hospitalized (1/14) at [facility], initially for a gout flare, but he was also found to have influenza and to be dehydrated.  He actually had a syncope episode in the hospital.  Troponin was mildly elevated to 0.88.  He was noted to be in atrial fibrillation briefly at [facility].  He then had a Lexiscan myoview in 1/14 that showed no definite ischemia or infarction.  Yesterday, he went for XRT at [facility] for prostate cancer.  He always feels "wiped out" after radiation treatment.  However, yesterday after radiation he also developed severe substernal chest pain.  He called his brother when it did not subside.  He says he has not had chest pain before.  He is a poor historian so I am not sure how long it lasted.  His brother brought him to the ER.   In the ER, initial troponin was negative but followup troponin levels were 2.47 and 3.81.  ECG showed LAFB with RBBB.  He had no further chest pain in the ER.  He was transferred to [facility] for further evaluation.  He is currently CP-free.  He lives with his wife in [place].  He walks with a walker apparently due to arthritis pains (blames gout).    Review of systems complete and found to be negative unless listed above   Current Facility-Administered Medications  Medication Dose Route Frequency Provider Last Rate Last Dose  . 0.9 %  sodium chloride infusion  250 mL Intravenous PRN Laurey Morale, MD      . Melene Muller ON 04/09/2012] 0.9 %  sodium chloride infusion  1 mL/kg/hr Intravenous Continuous Laurey Morale, MD 108.9 mL/hr at 04/08/12 0641 1 mL/kg/hr at 04/08/12 0641  . allopurinol (ZYLOPRIM) tablet 100 mg  100 mg Oral Daily Nimish C Gosrani, MD       . amLODipine (NORVASC) tablet 5 mg  5 mg Oral Daily Wilson Singer, MD      . Melene Muller ON 04/09/2012] aspirin chewable tablet 324 mg  324 mg Oral Pre-Cath Laurey Morale, MD      . aspirin chewable tablet 81 mg  81 mg Oral Daily Laurey Morale, MD      . atorvastatin (LIPITOR) tablet 80 mg  80 mg Oral q1800 Laurey Morale, MD      . enalapril (VASOTEC) tablet 2.5 mg  2.5 mg Oral q morning - 10a Nimish C Gosrani, MD      . glipiZIDE (GLUCOTROL XL) 24 hr tablet 2.5 mg  2.5 mg Oral Daily Nimish C Gosrani, MD      . heparin ADULT infusion 100 units/mL (25000 units/250 mL)  1,350 Units/hr Intravenous Continuous Wilson Singer, MD 13.5 mL/hr at 04/08/12 0135 1,350 Units/hr at 04/08/12 0135  . insulin aspart (novoLOG) injection 0-20 Units  0-20 Units Subcutaneous TID WC Nimish C Gosrani, MD      . insulin aspart (novoLOG) injection 0-5 Units  0-5 Units Subcutaneous QHS Nimish C Gosrani, MD      . metoprolol (LOPRESSOR) tablet 50 mg  50 mg Oral BID Nimish C Gosrani, MD      . sodium chloride 0.9 % injection 3 mL  3 mL Intravenous Q12H  Laurey Morale, MD      . sodium chloride 0.9 % injection 3 mL  3 mL Intravenous PRN Laurey Morale, MD      . tamsulosin (FLOMAX) capsule 0.4 mg  0.4 mg Oral Daily Nimish Normajean Glasgow, MD         PMH: 1. Paroxysmal atrial fibrillation: Noted in 1/14.  2. Prostate CA: Ongoing XRT. 3. HTN 4. Gout 5. Hyperlipidemia 6. TIA in 7/12 7. Left THR  8. DVT post-THR 9. CAD: Elevated troponin during hospitalization in 1/14 at [facility] with dehydration, influenza.  Lexiscan Myoview in 1/14 showed EF 56%, no definite ischemia or infarction.   Family History  Problem Relation Age of Onset  . Prostate cancer Brother   . Colon cancer Sister 47  . Diabetes Mellitus II Father   . Stroke Father     History   Social History  . Marital Status: Married    Spouse Name: N/A    Number of Children: 0  . Years of Education: N/A   Occupational History  . Banking      Retired  . Farming         "   Social History Main Topics  . Smoking status: Former Smoker    Types: Cigarettes, Cigars    Quit date: 01/27/1948  . Smokeless tobacco: Current User    Types: Chew     Comment: "quit smoking cigarettes age 50 or 38"  . Alcohol Use: 0.5 oz/week    1 drink(s) per week  . Drug Use: No  . Sexually Active: No   Other Topics Concern  . Not on file   Social History Narrative   Lives w/ wife      (Not in a hospital admission)  Physical Exam: Blood pressure 129/76, pulse 79, temperature 97.9 F (36.6 C), temperature source Oral, resp. rate 17, height 5\' 9"  (1.753 m), weight 240 lb (108.863 kg), SpO2 95.00%.  General: NAD, obese  Neck: No JVD, no thyromegaly or thyroid nodule.  Lungs: Clear to auscultation bilaterally with normal respiratory effort. CV: Nondisplaced PMI.  Heart regular S1/S2, no S3/S4, no murmur.  No peripheral edema.  No carotid bruit.  Normal pedal pulses.  Abdomen: Soft, nontender, no hepatosplenomegaly, no distention.  Skin: Intact without lesions or rashes.  Neurologic: Sleepy but oriented.  Psych: Normal affect. Extremities: No clubbing or cyanosis.  HEENT: Normal.   Labs:   Lab Results  Component Value Date   WBC 11.6* 04/07/2012   HGB 12.8* 04/07/2012   HCT 38.2* 04/07/2012   MCV 103.0* 04/07/2012   PLT 161 04/07/2012    Recent Labs Lab 04/07/12 1730  NA 135  K 3.6  CL 97  CO2 22  BUN 24*  CREATININE 0.95  CALCIUM 9.5  PROT 6.5  BILITOT 0.3  ALKPHOS 135*  ALT 31  AST 24  GLUCOSE 278*   Lab Results  Component Value Date   CKTOTAL 25 01/02/2012   CKMB 2.9 05/30/2011   TROPONINI 3.81* 04/07/2012     Radiology: - CXR: No acute findings  EKG: NSR, PACs, LAFB, RBBB  ASSESSMENT AND PLAN:  79 yo with history of PAF and prostate cancer presented to [facility] ER tonight with chest pain/NSTEMI. 1. CAD: NSTEMI.  Chest pain with significantly elevated troponin.  Now resolved.  He actually had a normal stress test  in 1/14.  I think that he needs evaluation with left heart cath.  - LHC today - He will be treated  with ASA, heparin gtt, atorvastatin 80.  - Continue home metoprolol.  2. Atrial fibrillation: Paroxysmal.  He appears to be in NSR with PACs currently.  Will repeat ECG.  He has not been on anticoagulation.  I am not sure he would be a good candidate for anticoagulation.  Continue metoprolol.  3. Prostate cancer: Undergoing XRT.   Signed: Marca Ancona 04/08/2012, 1:43 AM

## 2012-04-08 NOTE — Progress Notes (Signed)
  Echocardiogram 2D Echocardiogram has been performed.  [name] 04/08/2012, 10:14 AM

## 2012-04-08 NOTE — Progress Notes (Signed)
Utilization Review Completed.Dowell, Deborah T3/14/2014  

## 2012-04-08 NOTE — H&P (Signed)
[facility] History and Physical  [name] ZOX:[id number] DOB: Sep 13, 1931 DOA: 04/07/2012  Referring physician: Dr. Lynelle Doctor, ER physician. PCP: Aniceto Boss., MD    Chief Complaint: Chest pain.  HPI: [name] is a 79 y.o. male he describes chest pain, severe, associated with nausea lasting 30-45 minutes until he came to the emergency room this evening. The patient has been watched in the emergency room and serial troponin levels showed that the levels are rising. He currently appears to be chest pain-free. He had a nuclear medicine stress test in January 2014 which apparently was negative. He has a history of atrial fibrillation and prostate cancer, for which he has been receiving radiotherapy. Whilst in the emergency room, he received intravenous morphine and became somewhat hallucinating visually. He appears to have improved now.   Review of Systems:   Apart from history of present illness, other systems negative.  Past Medical History  Diagnosis Date  . Hypertension   . Gout     Acute episode involving ankle in 12/2011  . Hyperlipidemia   . Deep vein thrombophlebitis of left leg 11/2010    LLE; S/P THA  . Fasting hyperglycemia   . Stroke 08/2009; 07/2010    TIA in 07/2010  . H/O heat stroke 08/2009  . Prostate cancer 2013    adenocarcinoma; Gleason grade 8; PSA 9.45; EBRT 2013-14  . Renal cyst   . Syncope     01/2012: Negative pharmacologic stress nuclear  . Atrial fibrillation 01/2012    Asymptomatic; identified on telemetry during admission for a URI   Past Surgical History  Procedure Laterality Date  . Hip arthroplasty  12/08/2010    ARTHROPLASTY BIPOLAR HIP;  Surgeon-Olin; Left  . 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 I&d extremity  05/28/2011    Procedure: IRRIGATION AND DEBRIDEMENT EXTREMITY;  Surgeon: Sharma Covert, MD;  Location: [facility] OR;  Service: Orthopedics;  Laterality: Right;  I & D Right hand/wrist  . Colonoscopy    07/18/2003    RMR: Rectum internal hemorrhoids.  Otherwise, normal rectum/ Left-sided diverticula.  Remainder of colonic mucosa-nl   Social History:  reports that he quit smoking about 64 years ago. His smoking use included Cigarettes and Cigars. He smoked 0.00 packs per day. His smokeless tobacco use includes Chew. He reports that he drinks about 0.5 ounces of alcohol per week. He reports that he does not use illicit drugs.   Allergies  Allergen Reactions  . Tetanus Toxoids Swelling    Also allergic to "high powered pain medications."    Family History  Problem Relation Age of Onset  . Prostate cancer Brother   . Colon cancer Sister 12  . Diabetes Mellitus II Father   . Stroke Father       Prior to Admission medications   Medication Sig Start Date End Date Taking? Authorizing Provider  allopurinol (ZYLOPRIM) 100 MG tablet Take 300 mg by mouth daily. 01/04/12  Yes Shanker Levora Dredge, MD  amLODipine (NORVASC) 5 MG tablet Take 5 mg by mouth daily.    Yes Historical Provider, MD  enalapril (VASOTEC) 5 MG tablet Take 2.5 mg by mouth every morning.    Yes Historical Provider, MD  glipiZIDE (GLUCOTROL XL) 2.5 MG 24 hr tablet Take 2.5 mg by mouth daily.   Yes Historical Provider, MD  indomethacin (INDOCIN) 25 MG capsule Take 25 mg by mouth 3 (three) times daily.  Yes Historical Provider, MD  metoprolol (LOPRESSOR) 50 MG tablet Take 50 mg by mouth 2 (two) times daily.     Yes Historical Provider, MD  phenazopyridine (PYRIDIUM) 200 MG tablet Take 200 mg by mouth 3 (three) times daily.   Yes Historical Provider, MD  predniSONE (DELTASONE) 10 MG tablet Take 10 mg by mouth as directed.  01/04/12  Yes Shanker Levora Dredge, MD  simvastatin (ZOCOR) 20 MG tablet Take 20 mg by mouth at bedtime.    Yes Historical Provider, MD  tamsulosin (FLOMAX) 0.4 MG CAPS Take 0.4 mg by mouth daily.   Yes Historical Provider, MD  alendronate (FOSAMAX) 70 MG tablet Take 70 mg by mouth every 7 (seven) days.  11/02/11    Historical Provider, MD  ascorbic acid (VITAMIN C) 500 MG tablet Take 500 mg by mouth every morning.     Historical Provider, MD  aspirin EC 81 MG tablet Take 81 mg by mouth daily.    Historical Provider, MD  calcium-vitamin D (OSCAL WITH D) 500-200 MG-UNIT per tablet Take 1 tablet by mouth every morning.     Historical Provider, MD  cholecalciferol (VITAMIN D) 1000 UNITS tablet Take 1,000 Units by mouth daily.    Historical Provider, MD  COD LIVER OIL PO Take by mouth daily.    Historical Provider, MD  fish oil-omega-3 fatty acids 1000 MG capsule Take 1 g by mouth daily.    Historical Provider, MD  Multiple Vitamin (MULITIVITAMIN WITH MINERALS) TABS Take 1 tablet by mouth every morning.     Historical Provider, MD  vitamin E 400 UNIT capsule Take 400 Units by mouth every morning.     Historical Provider, MD   Physical Exam: Filed Vitals:   04/07/12 2100 04/07/12 2200 04/07/12 2239 04/08/12 0102  BP: 119/67 124/80 124/80   Pulse: 79 81 94   Temp:      TempSrc:      Resp: 17 12 21    Height:    5\' 9"  (1.753 m)  Weight:    108.863 kg (240 lb)  SpO2: 97% 96% 97%      General:  He looks systemically well. Does not appear to be in pain at the present time.  Eyes: No pallor. No jaundice.  ENT: No major abnormalities.   Neck: No lymphadenopathy.  Cardiovascular: Heart sounds are in atrial fibrillation. He does not appear to be in heart failure.  Respiratory: Lung fields are clear.  Abdomen: Soft, nontender.  Skin: No rash.  Musculoskeletal: No acute abnormalities.  Psychiatric: Not examined.  Neurologic: No focal neurological signs. He is slightly drowsy but responds to my questions.  Labs on Admission:  Basic Metabolic Panel:  Recent Labs Lab 04/07/12 1730  NA 135  K 3.6  CL 97  CO2 22  GLUCOSE 278*  BUN 24*  CREATININE 0.95  CALCIUM 9.5   Liver Function Tests:  Recent Labs Lab 04/07/12 1730  AST 24  ALT 31  ALKPHOS 135*  BILITOT 0.3  PROT 6.5  ALBUMIN  3.3*    Recent Labs Lab 04/07/12 1730  LIPASE 28    CBC:  Recent Labs Lab 04/07/12 1730  WBC 11.6*  NEUTROABS 10.3*  HGB 12.8*  HCT 38.2*  MCV 103.0*  PLT 161   Cardiac Enzymes:  Recent Labs Lab 04/07/12 1730 04/07/12 1930 04/07/12 2116  TROPONINI <0.30 2.47* 3.81*      Radiological Exams on Admission: Dg Chest Portable 1 View  04/07/2012  *RADIOLOGY REPORT*  Clinical Data: Chest  pain, nausea  CHEST - 1 VIEW  Comparison:  02/07/2012  Findings: The heart size and mediastinal contours are within normal limits.  Both lungs are clear.  IMPRESSION: No active disease.   Original Report Authenticated By: Judie Petit. Miles Costain, M.D.     EKG: Independently reviewed. Atrial fibrillation,  no acute ST-T wave changes.  Assessment/Plan   1. Non-ST elevation MI. 2. Chronic atrial fibrillation. 3. Hypertension. 4. Gout. 5. Prostate cancer being treated with radiotherapy.  Plan: 1. Needs to be admitted to step down unit. 2. Intravenous heparin. 3. Cardiology consultation.  Code Status: Full code.   Family Communication: Discussed plan with patient and patient's brother at the bedside.   Disposition Plan: Depending on progress.   Time spent: 45 minutes  Wilson Singer [facility] Pager [phone]  If 7PM-7AM, please contact night-coverage [email or web address] Password [email or web address] 04/08/2012, 1:05 AM

## 2012-04-08 NOTE — CV Procedure (Deleted)
   Cardiac Catheterization Procedure Note  Name: [name] MRN: [id number] DOB: 1931/06/10  Procedure: Left Heart Cath, Selective Coronary Angiography, LV angiography  Indication:   Chest pain consistent with [place]  Procedural details: The right radial was prepped, draped, and anesthetized with 1% lidocaine. Using modified Seldinger technique, a 5 Vanduzer sheath was introduced into the right radial artery. Standard Judkins catheters were used for coronary angiography and left ventriculography. Catheter exchanges were performed over a guidewire. There were no immediate procedural complications. The patient was transferred to the post catheterization recovery area for further monitoring.  Procedural Findings:   Hemodynamics:     AO 107/60    LV 109/4   Coronary angiography:   Coronary dominance: Right  Left mainstem:   Ostial 25% plaque. Otherwise normal.   Left anterior descending (LAD):   Large and wrapping the apex.  Mild mid luminal irregularities. D1 small and normal.  D2 moderate and normal.    Left circumflex (LCx):  AV groove mid luminal irregularities.  OM1 small and normal.  OM2 large and branching and normal.  PL normal.  Right coronary artery (RCA):  Large with proximal 25% stenosis.  Mild mid diffuse luminal irregularities.  PDA moderate sized and normal.  PL small and normal.    Left ventriculography: Left ventricular systolic function is normal, LVEF is estimated at 65%, there is no significant mitral regurgitation   Final Conclusions:  Mild nonobstructive plaque with preserved EF  Recommendations:   No further cardiac work up.  OK to discharge.   [name] 04/08/2012, 3:31 PM

## 2012-04-08 NOTE — Interval H&P Note (Signed)
History and Physical Interval Note:  04/08/2012 1:54 PM  [name] [place]  has presented today for surgery, with the diagnosis of NSTEMI  The various methods of treatment have been discussed with the patient and family. After consideration of risks, benefits and other options for treatment, the patient has consented to  Procedure(s): LEFT HEART CATHETERIZATION WITH CORONARY ANGIOGRAM (N/A) as a surgical intervention .  The patient's history has been reviewed, patient examined, no change in status, stable for surgery.  I have reviewed the patient's chart and labs.  Questions were answered to the patient's satisfaction.     [name]

## 2012-04-08 NOTE — ED Notes (Signed)
Patient resting with eyes closed. Respirations even and unlabored.

## 2012-04-09 ENCOUNTER — Inpatient Hospital Stay (HOSPITAL_COMMUNITY): Payer: Medicare Other

## 2012-04-09 ENCOUNTER — Encounter (HOSPITAL_COMMUNITY): Payer: Self-pay | Admitting: Nurse Practitioner

## 2012-04-09 DIAGNOSIS — I214 Non-ST elevation (NSTEMI) myocardial infarction: Secondary | ICD-10-CM | POA: Diagnosis present

## 2012-04-09 LAB — CBC
MCHC: 33.1 g/dL (ref 30.0–36.0)
MCV: 103.9 fL — ABNORMAL HIGH (ref 78.0–100.0)
Platelets: 125 10*3/uL — ABNORMAL LOW (ref 150–400)
RDW: 15.7 % — ABNORMAL HIGH (ref 11.5–15.5)
WBC: 6.6 10*3/uL (ref 4.0–10.5)

## 2012-04-09 LAB — GLUCOSE, CAPILLARY
Glucose-Capillary: 86 mg/dL (ref 70–99)
Glucose-Capillary: 143 mg/dL — ABNORMAL HIGH (ref 70–99)
Glucose-Capillary: 110 mg/dL — ABNORMAL HIGH (ref 70–99)

## 2012-04-09 LAB — HEPARIN LEVEL (UNFRACTIONATED): Heparin Unfractionated: 0.47 IU/mL (ref 0.30–0.70)

## 2012-04-09 MED ORDER — WARFARIN - PHARMACIST DOSING INPATIENT: Freq: Every day | Status: DC

## 2012-04-09 MED ORDER — ROSUVASTATIN CALCIUM 40 MG PO TABS
40.0000 mg | ORAL_TABLET | Freq: Every day | ORAL | Status: DC
  Administered 2012-04-09: 40 mg via ORAL
  Filled 2012-04-09 (×2): qty 1

## 2012-04-09 MED ORDER — DILTIAZEM HCL 30 MG PO TABS
30.0000 mg | ORAL_TABLET | Freq: Four times a day (QID) | ORAL | Status: DC
  Administered 2012-04-09 – 2012-04-10 (×4): 30 mg via ORAL
  Filled 2012-04-09 (×8): qty 1

## 2012-04-09 MED ORDER — TECHNETIUM TO 99M ALBUMIN AGGREGATED
6.0000 | Freq: Once | INTRAVENOUS | Status: AC | PRN
  Administered 2012-04-09: 6 via INTRAVENOUS

## 2012-04-09 MED ORDER — WARFARIN VIDEO: Freq: Once | Status: DC

## 2012-04-09 MED ORDER — XENON XE 133 GAS
10.0000 | GAS_FOR_INHALATION | Freq: Once | RESPIRATORY_TRACT | Status: AC | PRN
  Administered 2012-04-09: 10 via RESPIRATORY_TRACT

## 2012-04-09 MED ORDER — HEPARIN (PORCINE) IN NACL 100-0.45 UNIT/ML-% IJ SOLN
1350.0000 [IU]/h | INTRAMUSCULAR | Status: DC
  Administered 2012-04-09: 1350 [IU]/h via INTRAVENOUS
  Filled 2012-04-09 (×2): qty 250

## 2012-04-09 MED ORDER — COUMADIN BOOK
Freq: Once | Status: AC
  Administered 2012-04-09: 12:00:00
  Filled 2012-04-09: qty 1

## 2012-04-09 MED ORDER — WARFARIN SODIUM 7.5 MG PO TABS
7.5000 mg | ORAL_TABLET | Freq: Once | ORAL | Status: AC
  Administered 2012-04-09: 7.5 mg via ORAL
  Filled 2012-04-09: qty 1

## 2012-04-09 MED ORDER — HEPARIN BOLUS VIA INFUSION
2000.0000 [IU] | Freq: Once | INTRAVENOUS | Status: AC
  Administered 2012-04-09: 2000 [IU] via INTRAVENOUS
  Filled 2012-04-09: qty 2000

## 2012-04-09 NOTE — Progress Notes (Signed)
ANTICOAGULATION CONSULT NOTE - Follow Up Consult  Pharmacy Consult for Heparin/Coumadin Indication: afib  Allergies  Allergen Reactions  . Tetanus Toxoids Swelling    Also allergic to "high powered pain medications."    Patient Measurements: Height: 5\' 9"  (175.3 cm) Weight: 224 lb 10.4 oz (101.9 kg) IBW/kg (Calculated) : 70.7 Heparin Dosing Weight: 92kg  Vital Signs: Temp: 99.1 F (37.3 C) (03/15 0341) Temp src: Oral (03/15 0341) BP: 139/52 mmHg (03/15 0341) Pulse Rate: 88 (03/15 0341)  Labs:  Recent Labs  04/07/12 1730 04/07/12 1930 04/07/12 2116 04/08/12 0059 04/08/12 0710 04/09/12 0438  HGB 12.8*  --   --   --  11.5* 11.4*  HCT 38.2*  --   --   --  33.5* 34.4*  PLT 161  --   --   --  127* 125*  LABPROT  --   --   --  11.7  --   --   INR  --   --   --  0.86  --   --   HEPARINUNFRC  --   --   --   --  0.46  --   CREATININE 0.95  --   --   --  0.93  --   TROPONINI <0.30 2.47* 3.81*  --  4.79*  --     Estimated Creatinine Clearance: 77 ml/min (by C-G formula based on Cr of 0.93).   Medications:  Was previously on heparin at 1350 units/hr  Assessment: 80yom previuosly on heparin for CP.  Heparin was resumed after cath to r/o PE.  VQ was negative so heparin dced.  Heparin to resume today along with coumadin for afib and questionable coronary embolus.  Goal of Therapy:  Heparin level 0.3-0.7 units/ml Monitor platelets by anticoagulation protocol: Yes INR 2-3   Plan:  1) Resume heparin at 1350 units/hr with 2000 unit bolus 2) Coumadin 7.5 mg today 3)  Check HL 8 hours after bolus. 4)  Daily HL, CBC and PT/INR. 5)  Monitor for s&s bleeding. 6)  F/u coumadin education (he has been on previously)  Delcia Spitzley, [employer] 04/09/2012,11:18 AM

## 2012-04-09 NOTE — Progress Notes (Signed)
ANTICOAGULATION CONSULT NOTE - Follow Up Consult  Pharmacy Consult for Heparin/Coumadin Indication: afib  Allergies  Allergen Reactions  . Tetanus Toxoids Swelling    Also allergic to "high powered pain medications."    Patient Measurements: Height: 5\' 9"  (175.3 cm) Weight: 224 lb 10.4 oz (101.9 kg) IBW/kg (Calculated) : 70.7 Heparin Dosing Weight: 92kg  Vital Signs: Temp: 98.9 F (37.2 C) (03/15 2051) Temp src: Axillary (03/15 2051) BP: 109/54 mmHg (03/15 2051) Pulse Rate: 138 (03/15 2051)  Labs:  Recent Labs  04/07/12 1730 04/07/12 1930 04/07/12 2116 04/08/12 0059 04/08/12 0710 04/09/12 0438 04/09/12 2029  HGB 12.8*  --   --   --  11.5* 11.4*  --   HCT 38.2*  --   --   --  33.5* 34.4*  --   PLT 161  --   --   --  127* 125*  --   LABPROT  --   --   --  11.7  --   --   --   INR  --   --   --  0.86  --   --   --   HEPARINUNFRC  --   --   --   --  0.46  --  0.47  CREATININE 0.95  --   --   --  0.93  --   --   TROPONINI <0.30 2.47* 3.81*  --  4.79*  --   --     Estimated Creatinine Clearance: 77 ml/min (by C-G formula based on Cr of 0.93).   Medications:  heparin at 1350 units/hr  Assessment: 80yom previously on heparin for CP.  Heparin was resumed after cath to r/o PE.  VQ was negative so heparin dced.  Heparin to resume today along with coumadin for afib and questionable coronary embolus.  Heparin level therapeutic at 0.47 units/ml  Goal of Therapy:  Heparin level 0.3-0.7 units/ml Monitor platelets by anticoagulation protocol: Yes INR 2-3   Plan:  1) Cont heparin at 1350 units/hr 2)  F/u am labs   Talbert Cage Poteet 04/09/2012,9:33 PM

## 2012-04-09 NOTE — Progress Notes (Addendum)
Patient ID: AVELARDO REESMAN, male   DOB: 04-07-1931, 79 y.o.   MRN: [id number]   Patient Name: [name] Date of Encounter: 04/09/2012    SUBJECTIVE  No further chest discomfort but clearly had an episode of unstable angina on admission. Significant troponin increase. Was on amlodipine 5 mg a day at home. Question of coronary spasm. VQ scan is normal today. Wants to go home.  CURRENT MEDS . allopurinol  100 mg Oral Daily  . amLODipine  5 mg Oral Daily  . aspirin  81 mg Oral Daily  . atorvastatin  80 mg Oral q1800  . enalapril  2.5 mg Oral q morning - 10a  . glipiZIDE  2.5 mg Oral Daily  . insulin aspart  0-20 Units Subcutaneous TID WC  . insulin aspart  0-5 Units Subcutaneous QHS  . metoprolol  50 mg Oral BID  . sodium chloride  3 mL Intravenous Q12H  . tamsulosin  0.4 mg Oral Daily    OBJECTIVE  Filed Vitals:   04/08/12 1615 04/08/12 1645 04/08/12 2132 04/09/12 0341  BP: 130/71 122/72 133/47 139/52  Pulse: 91 87 110 88  Temp:   98.9 F (37.2 C) 99.1 F (37.3 C)  TempSrc:   Oral Oral  Resp:   20 20  Height:      Weight:      SpO2:   94% 96%    Intake/Output Summary (Last 24 hours) at 04/09/12 1030 Last data filed at 04/09/12 0500  Gross per 24 hour  Intake      0 ml  Output   1050 ml  Net  -1050 ml   Filed Weights   04/08/12 0102 04/08/12 0317  Weight: 240 lb (108.863 kg) 224 lb 10.4 oz (101.9 kg)    PHYSICAL EXAM  General: Pleasant, NAD.obese, elderly Neuro: Alert and oriented X 3. Moves all extremities spontaneously. Psych: Normal affect. HEENT:  Normal  Neck: Supple without bruits or JVD. Lungs:  Resp regular and unlabored, CTA. Heart: RRR no s3, s4, or murmurs. Abdomen: Soft, non-tender, non-distended, BS + x 4.  Extremities: No clubbing, cyanosis or edema. DP/PT/Radials 2+ and equal bilaterally.  Accessory Clinical Findings  CBC  Recent Labs  04/07/12 1730 04/08/12 0710 04/09/12 0438  WBC 11.6* 7.0 6.6  NEUTROABS 10.3*  --   --   HGB  12.8* 11.5* 11.4*  HCT 38.2* 33.5* 34.4*  MCV 103.0* 100.3* 103.9*  PLT 161 127* 125*   Basic Metabolic Panel  Recent Labs  04/07/12 1730 04/08/12 0710  NA 135 146*  K 3.6 4.1  CL 97 112  CO2 22 26  GLUCOSE 278* 76  BUN 24* 19  CREATININE 0.95 0.93  CALCIUM 9.5 8.9   Liver Function Tests  Recent Labs  04/07/12 1730  AST 24  ALT 31  ALKPHOS 135*  BILITOT 0.3  PROT 6.5  ALBUMIN 3.3*    Recent Labs  04/07/12 1730  LIPASE 28   Cardiac Enzymes  Recent Labs  04/07/12 1930 04/07/12 2116 04/08/12 0710  TROPONINI 2.47* 3.81* 4.79*   BNP No components found with this basename: POCBNP,  D-Dimer No results found for this basename: DDIMER,  in the last 72 hours Hemoglobin A1C No results found for this basename: HGBA1C,  in the last 72 hours Fasting Lipid Panel No results found for this basename: CHOL, HDL, LDLCALC, TRIG, CHOLHDL, LDLDIRECT,  in the last 72 hours Thyroid Function Tests No results found for this basename: TSH, T4TOTAL, FREET3, T3FREE, THYROIDAB,  in the last 72 hours  TELE  Atrial fibrillation, rate 90-110.  ECG  EKGs have been stable since admission.  Radiology/Studies  Nm Pulmonary Perf And Vent  04/09/2012  *RADIOLOGY REPORT*  Clinical Data:  80 year old male with chest pain.  NUCLEAR MEDICINE VENTILATION - PERFUSION LUNG SCAN  Technique:  Wash-in, equilibrium, and wash-out phase ventilation images were obtained using Xe-133 gas.  Perfusion images were obtained in multiple projections after intravenous injection of Tc- 39m MAA.  Radiopharmaceuticals:  10.0 mCi Xe-133 gas and 6.0 mCi Tc-83m MAA.  Comparison:  04/08/2011 chest radiograph.  Findings: No perfusion abnormalities are identified.  There are no segmental perfusion defects identified. Ventilation is normal. No ventilation defects are present.  IMPRESSION: Normal pulmonary perfusion and ventilation.   Original Report Authenticated By: Harmon Pier, M.D.    Dg Chest Portable 1  View  04/07/2012  *RADIOLOGY REPORT*  Clinical Data: Chest pain, nausea  CHEST - 1 VIEW  Comparison:  02/07/2012  Findings: The heart size and mediastinal contours are within normal limits.  Both lungs are clear.  IMPRESSION: No active disease.   Original Report Authenticated By: Judie Petit. Shick, M.D.     ASSESSMENT AND PLAN  Principal Problem:   Chest pain at rest Active Problems:   Hypertension   Gout   Prostate cancer   Atrial fibrillation   He is in A. Fib this morning. He is taking Coumadin in the past with a DVT. He is asymptomatic. We'll change his amlodipine, which he has received today, to diltiazem 30 mg 4 times a day. This will be for rate control and possible coronary spasm. He may have had possible coronary embolus as well. Would not be a discharge today. Discussed with sister and patient. They understand.I have added non-STEMI to his problem list.   Signed, Valera Castle MD

## 2012-04-10 ENCOUNTER — Encounter (HOSPITAL_COMMUNITY): Payer: Self-pay | Admitting: Nurse Practitioner

## 2012-04-10 DIAGNOSIS — I2510 Atherosclerotic heart disease of native coronary artery without angina pectoris: Secondary | ICD-10-CM

## 2012-04-10 LAB — CBC
Platelets: 112 10*3/uL — ABNORMAL LOW (ref 150–400)
RBC: 3.21 MIL/uL — ABNORMAL LOW (ref 4.22–5.81)
WBC: 5.9 10*3/uL (ref 4.0–10.5)

## 2012-04-10 LAB — PROTIME-INR: Prothrombin Time: 13.7 seconds (ref 11.6–15.2)

## 2012-04-10 LAB — HEPARIN LEVEL (UNFRACTIONATED): Heparin Unfractionated: 0.34 IU/mL (ref 0.30–0.70)

## 2012-04-10 MED ORDER — DILTIAZEM HCL ER COATED BEADS 180 MG PO CP24
180.0000 mg | ORAL_CAPSULE | Freq: Every day | ORAL | Status: DC
  Administered 2012-04-10: 180 mg via ORAL
  Filled 2012-04-10: qty 1

## 2012-04-10 MED ORDER — DILTIAZEM HCL ER COATED BEADS 180 MG PO CP24: 180.0000 mg | ORAL_CAPSULE | Freq: Every day | ORAL | Status: DC

## 2012-04-10 MED ORDER — DABIGATRAN ETEXILATE MESYLATE 150 MG PO CAPS: 150.0000 mg | ORAL_CAPSULE | Freq: Two times a day (BID) | ORAL | Status: DC

## 2012-04-10 NOTE — Progress Notes (Signed)
Patient ID: [name], male   DOB: [birth year], 79 y.o.   MRN: [id number]   Patient Name: [name] Date of Encounter: 04/10/2012    SUBJECTIVE  No chest pain or shortness of breath. Heparin started yesterday. He has had problems with Coumadin in the past because of his severe gout which requires him to take indomethacin and prednisone. He was on allopurinol at the time of admission.  CURRENT MEDS . allopurinol  100 mg Oral Daily  . aspirin  81 mg Oral Daily  . diltiazem  30 mg Oral Q6H  . enalapril  2.5 mg Oral q morning - 10a  . glipiZIDE  2.5 mg Oral Daily  . insulin aspart  0-20 Units Subcutaneous TID WC  . insulin aspart  0-5 Units Subcutaneous QHS  . metoprolol  50 mg Oral BID  . rosuvastatin  40 mg Oral q1800  . sodium chloride  3 mL Intravenous Q12H  . tamsulosin  0.4 mg Oral Daily  . warfarin   Does not apply Once  . Warfarin - Pharmacist Dosing Inpatient   Does not apply q1800    OBJECTIVE  Filed Vitals:   04/09/12 1408 04/09/12 2051 04/09/12 2239 04/10/12 0453  BP: 99/51 109/54 120/59 114/61  Pulse: 86 138 92 81  Temp: 98.5 F (36.9 C) 98.9 F (37.2 C)  98.9 F (37.2 C)  TempSrc: Axillary Axillary  Oral  Resp: 19 18  18   Height:      Weight:      SpO2: 96% 94%  93%    Intake/Output Summary (Last 24 hours) at 04/10/12 0814 Last data filed at 04/10/12 0500  Gross per 24 hour  Intake    240 ml  Output   1400 ml  Net  -1160 ml   Filed Weights   04/08/12 0102 04/08/12 0317  Weight: 240 lb (108.863 kg) 224 lb 10.4 oz (101.9 kg)    PHYSICAL EXAM  General: Pleasant, NAD.obese Neuro: Alert and oriented X 3. Moves all extremities spontaneously. Psych: Normal affect. HEENT:  Normal  Neck: Supple without bruits or JVD. Lungs:  Resp regular and unlabored, CTA. Heart: RRR no s3, s4, or murmurs. Abdomen: Soft, non-tender, non-distended, BS + x 4.  Extremities: No clubbing, cyanosis or edema. DP/PT/Radials 2+ and equal bilaterally.  Accessory  Clinical Findings  CBC  Recent Labs  04/07/12 1730  04/09/12 0438 04/10/12 0610  WBC 11.6*  < > 6.6 5.9  NEUTROABS 10.3*  --   --   --   HGB 12.8*  < > 11.4* 11.2*  HCT 38.2*  < > 34.4* 32.3*  MCV 103.0*  < > 103.9* 100.6*  PLT 161  < > 125* 112*  < > = values in this interval not displayed. Basic Metabolic Panel  Recent Labs  04/07/12 1730 04/08/12 0710  NA 135 146*  K 3.6 4.1  CL 97 112  CO2 22 26  GLUCOSE 278* 76  BUN 24* 19  CREATININE 0.95 0.93  CALCIUM 9.5 8.9   Liver Function Tests  Recent Labs  04/07/12 1730  AST 24  ALT 31  ALKPHOS 135*  BILITOT 0.3  PROT 6.5  ALBUMIN 3.3*    Recent Labs  04/07/12 1730  LIPASE 28   Cardiac Enzymes  Recent Labs  04/07/12 1930 04/07/12 2116 04/08/12 0710  TROPONINI 2.47* 3.81* 4.79*   BNP No components found with this basename: POCBNP,  D-Dimer No results found for this basename: DDIMER,  in the last 72  hours Hemoglobin A1C No results found for this basename: HGBA1C,  in the last 72 hours Fasting Lipid Panel No results found for this basename: CHOL, HDL, LDLCALC, TRIG, CHOLHDL, LDLDIRECT,  in the last 72 hours Thyroid Function Tests No results found for this basename: TSH, T4TOTAL, FREET3, T3FREE, THYROIDAB,  in the last 72 hours  TELE  Normal sinus rhythm ECG   Radiology/Studies  Nm Pulmonary Perf And Vent  04/09/2012  *RADIOLOGY REPORT*  Clinical Data:  79 year old male with chest pain.  NUCLEAR MEDICINE VENTILATION - PERFUSION LUNG SCAN  Technique:  Wash-in, equilibrium, and wash-out phase ventilation images were obtained using Xe-133 gas.  Perfusion images were obtained in multiple projections after intravenous injection of Tc- 8m MAA.  Radiopharmaceuticals:  10.0 mCi Xe-133 gas and 6.0 mCi Tc-65m MAA.  Comparison:  04/08/2011 chest radiograph.  Findings: No perfusion abnormalities are identified.  There are no segmental perfusion defects identified. Ventilation is normal. No ventilation  defects are present.  IMPRESSION: Normal pulmonary perfusion and ventilation.   Original Report Authenticated By: Harmon Pier, M.D.    Dg Chest Portable 1 View  04/07/2012  *RADIOLOGY REPORT*  Clinical Data: Chest pain, nausea  CHEST - 1 VIEW  Comparison:  02/07/2012  Findings: The heart size and mediastinal contours are within normal limits.  Both lungs are clear.  IMPRESSION: No active disease.   Original Report Authenticated By: Judie Petit. Shick, M.D.     ASSESSMENT AND PLAN  Principal Problem:   Chest pain at rest Active Problems:   Hypertension   Gout   Prostate cancer   Atrial fibrillation   Non-STEMI (non-ST elevated myocardial infarction)    He is back in normal sinus rhythm. We'll change diltiazem to extended release 180 mg every morning. His brother and he had multiple questions about taking Coumadin. We do not know for sure he had coronary embolus is just a educated guess. I have no problem with him taking Pradaxa that would be less than problem with other drugs. He will need careful monitoring of his pro time regardless. Followup in the office within 10 days.  Signed, Valera Castle MD

## 2012-04-10 NOTE — Discharge Summary (Signed)
Patient ID: [name],  MRN: [id number], DOB/AGE: 04-14-1931 79 y.o.  Admit date: 04/07/2012 Discharge date: 04/10/2012  Primary Care Provider: SETTLE,PAUL C. Primary Cardiologist: R. Rothbart, MD  Discharge Diagnoses Principal Problem:   Non-STEMI (non-ST elevated myocardial infarction)  **Status-post cath this admission revealing non-obstructive CAD.  **Normal V:Q scan this admission. Active Problems:   Chest pain at rest   Gout   Paroxysmal Atrial fibrillation  **Pradaxa initiated this admission (pt reported prior intolerance to coumadin)   Hypertension   Hyperlipidemia   Non-obstructive CAD (coronary artery disease)   Prostate cancer  Allergies Allergies  Allergen Reactions  . Tetanus Toxoids Swelling    Also allergic to "high powered pain medications."   Procedures  Cardiac Catheterization 04/08/2012  Procedural Findings:             Hemodynamics:                                      AO 124/72                                     LV 125/8              Coronary angiography:    Coronary dominance: Right  Left mainstem:   Long 50% stenosis with heavy calcification.    Left anterior descending (LAD):   Proximal LAD with 50% stenosis.  Proximal to mid long 50% stenosis. Mid to distal long 30% stenosis.  Mid diagonal moderate sized and normal.   Left circumflex (LCx):  AV groove normal.  Large branching RI. Mild luminal irregularities.  Superior branch with ostial 60% stenosis.  Small PLs x 2 normal. Right coronary artery (RCA):  Small.  Diffuse mild nonobstructive plaque Left ventriculography: Left ventricular systolic function is normal, LVEF is estimated at 55%, there is no significant mitral regurgitation   Final Conclusions:  Non obstructive CAD.  Preserved EF.    _____________  2D Echocardiogram 04/08/2012  Study Conclusions  Left ventricle: The cavity size was normal. Wall thickness was increased in a pattern of moderate LVH. The estimated ejection  fraction was 60%. Wall motion was normal; there were no regional wall motion abnormalities. _____________  V:Q Scan 04/09/2012  IMPRESSION: Normal pulmonary perfusion and ventilation. _____________  History of Present Illness  79 year old male with the above problem list. He was admitted to [facility] in January of this year with flulike symptoms and gout flare and was subsequently noted to have elevation of troponin to 0.88. He was also found to have a brief episode of paroxysmal atrial fibrillation. He underwent stress testing while hospitalized and this showed no definite ischemia. Following discharge, he continued with radiation therapy for prostate cancer. This generally causes him to experience fatigue and malaise. On the day prior to admission, following radiation therapy, he developed severe substernal chest pain and subsequently presented to the [facility] emergency department where his initial troponin was normal however subsequent troponins rose to 3.81. Decision was made to transfer him to [facility] for further evaluation and management of non-ST segment elevation myocardial infarction.  Hospital Course  [name] troponin he eventually peaked at 4.79 and decision was made to pursue diagnostic catheterization. This took place on March 14 revealing nonobstructive coronary artery disease with normal LV function as outlined above. 2-D echocardiogram confirmed normal  LV function without regional wall motion abnormalities. Patient had no further chest discomfort. On March 15, he underwent ventilation: perfusion scanning, which was normal. Patient was also noted to have atrial fibrillation on March 15, and calcium channel blocker therapy was switched from amlodipine to diltiazem. Patient subsequently converted back to sinus rhythm and diltiazem therapy has been consolidated. Given risk factors with a CHADS2 score of 2 along with unclear reasons for non-ST segment elevation  myocardial infarction (? Coronary vasospasm versus coronary artery embolus), long-term anticoagulation has been initiated. Patient reported prior history of intolerance to Coumadin however is willing to initiate Pradaxa therapy and thus this is been prescribed at discharge. Patient is being discharged home today in good condition and we will arrange for followup in our [place] office within 7 days.  Discharge Vitals Blood pressure 114/61, pulse 81, temperature 98.9 F (37.2 C), temperature source Oral, resp. rate 18, height 5\' 9"  (1.753 m), weight 224 lb 10.4 oz (101.9 kg), SpO2 93.00%.  Filed Weights   04/08/12 0102 04/08/12 0317  Weight: 240 lb (108.863 kg) 224 lb 10.4 oz (101.9 kg)   Labs  CBC  Recent Labs  04/07/12 1730  04/09/12 0438 04/10/12 0610  WBC 11.6*  < > 6.6 5.9  NEUTROABS 10.3*  --   --   --   HGB 12.8*  < > 11.4* 11.2*  HCT 38.2*  < > 34.4* 32.3*  MCV 103.0*  < > 103.9* 100.6*  PLT 161  < > 125* 112*  < > = values in this interval not displayed. Basic Metabolic Panel  Recent Labs  04/07/12 1730 04/08/12 0710  NA 135 146*  K 3.6 4.1  CL 97 112  CO2 22 26  GLUCOSE 278* 76  BUN 24* 19  CREATININE 0.95 0.93  CALCIUM 9.5 8.9   Liver Function Tests  Recent Labs  04/07/12 1730  AST 24  ALT 31  ALKPHOS 135*  BILITOT 0.3  PROT 6.5  ALBUMIN 3.3*    Recent Labs  04/07/12 1730  LIPASE 28   Cardiac Enzymes  Recent Labs  04/07/12 1930 04/07/12 2116 04/08/12 0710  TROPONINI 2.47* 3.81* 4.79*   Disposition  Pt is being discharged home today in good condition.  Follow-up Plans & Appointments  Follow-up Information   Follow up with [place] Bing, MD In 1 week. (We will arrange for f/u with either Dr. Dietrich Pates or his Nurse Practitioner.)    Contact information:   [address] [place] [address] [phone]      Discharge Medications    Medication List    STOP taking these medications       amLODipine 5 MG tablet    Commonly known as:  NORVASC      TAKE these medications       alendronate 70 MG tablet  Commonly known as:  FOSAMAX  Take 70 mg by mouth every 7 (seven) days.     allopurinol 100 MG tablet  Commonly known as:  ZYLOPRIM  Take 300 mg by mouth daily.     ascorbic acid 500 MG tablet  Commonly known as:  VITAMIN C  Take 500 mg by mouth every morning.     aspirin EC 81 MG tablet  Take 81 mg by mouth daily.     calcium-vitamin D 500-200 MG-UNIT per tablet  Commonly known as:  OSCAL WITH D  Take 1 tablet by mouth every morning.     cholecalciferol 1000 UNITS tablet  Commonly known as:  VITAMIN D  Take 1,000 Units by mouth daily.     COD LIVER OIL PO  Take by mouth daily.     dabigatran 150 MG Caps  Commonly known as:  PRADAXA  Take 1 capsule (150 mg total) by mouth every 12 (twelve) hours.     diltiazem 180 MG 24 hr capsule  Commonly known as:  CARDIZEM CD  Take 1 capsule (180 mg total) by mouth daily.     enalapril 5 MG tablet  Commonly known as:  VASOTEC  Take 2.5 mg by mouth every morning.     fish oil-omega-3 fatty acids 1000 MG capsule  Take 1 g by mouth daily.     glipiZIDE 2.5 MG 24 hr tablet  Commonly known as:  GLUCOTROL XL  Take 2.5 mg by mouth daily.     indomethacin 25 MG capsule  Commonly known as:  INDOCIN  Take 25 mg by mouth 3 (three) times daily.     metoprolol 50 MG tablet  Commonly known as:  LOPRESSOR  Take 50 mg by mouth 2 (two) times daily.     multivitamin with minerals Tabs  Take 1 tablet by mouth every morning.     phenazopyridine 200 MG tablet  Commonly known as:  PYRIDIUM  Take 200 mg by mouth 3 (three) times daily.     predniSONE 10 MG tablet  Commonly known as:  DELTASONE  Take 10 mg by mouth as directed.     simvastatin 20 MG tablet  Commonly known as:  ZOCOR  Take 20 mg by mouth at bedtime.     tamsulosin 0.4 MG Caps  Commonly known as:  FLOMAX  Take 0.4 mg by mouth daily.     vitamin E 400 UNIT capsule  Take 400  Units by mouth every morning.       Outstanding Labs/Studies  He should have f/u CBC @ office visit.  Duration of Discharge Encounter   Greater than 30 minutes including physician time.  Signed, Nicolasa Ducking NP 04/10/2012, 9:14 AM

## 2012-04-11 ENCOUNTER — Telehealth: Payer: Self-pay | Admitting: Cardiology

## 2012-04-11 NOTE — Telephone Encounter (Signed)
Spoke with patient, himself, who states that he is not having any new problems and is not in need of anything but a refill on his nitro, which he is not even on at this time.  He has a post hospital appointment scheduled, therefore will address all medication questions.  Advised him to contact us if he had any concerns.

## 2012-04-11 NOTE — Telephone Encounter (Signed)
PT BROTHER JERRY STOPPED BY VERY CONCERNED ABOUT PATIENT CONDITION. STATES THAT HE CAN NOT STAND UP FEEL NUMB IN THE LEGS, STILL HAVING BURNING PAIN BUT THIS HAS BEEN EVER SINCE HE STARTED RADIATION TREATMENTS.  PT NEEDS NITRO PILLS CALLED IN HE DOES NOT HAVE ANY.  I ASKED THE BROTHER IF I COULD GET APPT THIS WEEK IF HE WOULD BE ABLE TO COME, THE ANSWER WAS HECK NO. HE CANT EVEN GET PATIENT TO STAND UP.

## 2012-04-11 NOTE — Telephone Encounter (Signed)
TCM - d/c'd on 04/10/12 / tgs

## 2012-04-12 ENCOUNTER — Inpatient Hospital Stay (HOSPITAL_COMMUNITY)
Admission: EM | Admit: 2012-04-12 | Discharge: 2012-04-13 | DRG: 554 | Disposition: A | Discharge status: Home / Self Care | Payer: Medicare Other | Attending: Internal Medicine | Admitting: Internal Medicine

## 2012-04-12 ENCOUNTER — Ambulatory Visit: Payer: Medicare Other | Admitting: Urology

## 2012-04-12 ENCOUNTER — Emergency Department (HOSPITAL_COMMUNITY): Payer: Medicare Other

## 2012-04-12 ENCOUNTER — Encounter (HOSPITAL_COMMUNITY): Payer: Self-pay

## 2012-04-12 DIAGNOSIS — I4891 Unspecified atrial fibrillation: Secondary | ICD-10-CM | POA: Diagnosis present

## 2012-04-12 DIAGNOSIS — C61 Malignant neoplasm of prostate: Secondary | ICD-10-CM | POA: Diagnosis present

## 2012-04-12 DIAGNOSIS — I214 Non-ST elevation (NSTEMI) myocardial infarction: Secondary | ICD-10-CM | POA: Diagnosis present

## 2012-04-12 DIAGNOSIS — E2749 Other adrenocortical insufficiency: Secondary | ICD-10-CM

## 2012-04-12 DIAGNOSIS — E2740 Unspecified adrenocortical insufficiency: Secondary | ICD-10-CM

## 2012-04-12 DIAGNOSIS — Z8546 Personal history of malignant neoplasm of prostate: Secondary | ICD-10-CM

## 2012-04-12 DIAGNOSIS — R509 Fever, unspecified: Secondary | ICD-10-CM

## 2012-04-12 DIAGNOSIS — IMO0002 Reserved for concepts with insufficient information to code with codable children: Secondary | ICD-10-CM

## 2012-04-12 DIAGNOSIS — E785 Hyperlipidemia, unspecified: Secondary | ICD-10-CM | POA: Diagnosis present

## 2012-04-12 DIAGNOSIS — I10 Essential (primary) hypertension: Secondary | ICD-10-CM

## 2012-04-12 DIAGNOSIS — Z8673 Personal history of transient ischemic attack (TIA), and cerebral infarction without residual deficits: Secondary | ICD-10-CM

## 2012-04-12 DIAGNOSIS — M109 Gout, unspecified: Principal | ICD-10-CM | POA: Diagnosis present

## 2012-04-12 DIAGNOSIS — I80202 Phlebitis and thrombophlebitis of unspecified deep vessels of left lower extremity: Secondary | ICD-10-CM

## 2012-04-12 DIAGNOSIS — Z79899 Other long term (current) drug therapy: Secondary | ICD-10-CM

## 2012-04-12 DIAGNOSIS — Z87891 Personal history of nicotine dependence: Secondary | ICD-10-CM

## 2012-04-12 DIAGNOSIS — I2510 Atherosclerotic heart disease of native coronary artery without angina pectoris: Secondary | ICD-10-CM

## 2012-04-12 DIAGNOSIS — R55 Syncope and collapse: Secondary | ICD-10-CM

## 2012-04-12 DIAGNOSIS — R7301 Impaired fasting glucose: Secondary | ICD-10-CM

## 2012-04-12 DIAGNOSIS — Z7982 Long term (current) use of aspirin: Secondary | ICD-10-CM

## 2012-04-12 DIAGNOSIS — I252 Old myocardial infarction: Secondary | ICD-10-CM

## 2012-04-12 DIAGNOSIS — R079 Chest pain, unspecified: Secondary | ICD-10-CM

## 2012-04-12 LAB — CBC WITH DIFFERENTIAL/PLATELET
Lymphocytes Relative: 14 % (ref 12–46)
Lymphs Abs: 0.8 10*3/uL (ref 0.7–4.0)
MCV: 102.4 fL — ABNORMAL HIGH (ref 78.0–100.0)
Neutro Abs: 4.1 10*3/uL (ref 1.7–7.7)
Neutrophils Relative %: 73 % (ref 43–77)
Platelets: 141 10*3/uL — ABNORMAL LOW (ref 150–400)
RBC: 3.35 MIL/uL — ABNORMAL LOW (ref 4.22–5.81)
WBC: 5.6 10*3/uL (ref 4.0–10.5)

## 2012-04-12 LAB — BASIC METABOLIC PANEL
CO2: 25 mEq/L (ref 19–32)
Glucose, Bld: 105 mg/dL — ABNORMAL HIGH (ref 70–99)
Potassium: 4 mEq/L (ref 3.5–5.1)
Sodium: 138 mEq/L (ref 135–145)

## 2012-04-12 LAB — URINE MICROSCOPIC-ADD ON

## 2012-04-12 LAB — URINALYSIS, ROUTINE W REFLEX MICROSCOPIC
Bilirubin Urine: NEGATIVE
Ketones, ur: NEGATIVE mg/dL
Nitrite: POSITIVE — AB
Protein, ur: 30 mg/dL — AB
Urobilinogen, UA: 2 mg/dL — ABNORMAL HIGH (ref 0.0–1.0)

## 2012-04-12 LAB — LACTIC ACID, PLASMA: Lactic Acid, Venous: 1.7 mmol/L (ref 0.5–2.2)

## 2012-04-12 LAB — TROPONIN I: Troponin I: <0.3 ng/mL (ref <0.30)

## 2012-04-12 MED ORDER — SODIUM CHLORIDE 0.9 % IV SOLN
INTRAVENOUS | Status: DC
  Administered 2012-04-13: 04:00:00 via INTRAVENOUS

## 2012-04-12 MED ORDER — HYDROCORTISONE SOD SUCCINATE 100 MG IJ SOLR
50.0000 mg | Freq: Once | INTRAMUSCULAR | Status: AC
  Administered 2012-04-12: 50 mg via INTRAVENOUS
  Filled 2012-04-12: qty 2

## 2012-04-12 MED ORDER — ACETAMINOPHEN 500 MG PO TABS
1000.0000 mg | ORAL_TABLET | Freq: Once | ORAL | Status: AC
  Administered 2012-04-12: 1000 mg via ORAL
  Filled 2012-04-12: qty 2

## 2012-04-12 MED ORDER — SODIUM CHLORIDE 0.9 % IV BOLUS (SEPSIS)
250.0000 mL | Freq: Once | INTRAVENOUS | Status: AC
  Administered 2012-04-12: 250 mL via INTRAVENOUS

## 2012-04-12 NOTE — H&P (Signed)
[facility] History and Physical  TYREES CHOPIN WGN:[id number] DOB: 05-27-1931    PCP:   Aniceto Boss., MD   Chief Complaint: fever and weakness.  HPI: [name] is an 79 y.o. male with hx of nonobtructive CAD, recent cath, hx of gout on prednisone for over 6 months, afib, HTN, saw his cardiologist Dr Gloris Manchester, and was told to stop his prednisone and Indocin, and start on Pradaxa and Cardiozem 180mg / day.  He subsequently felt weak and orthostatic symptomology, along with feeling low grade fever and increase pain and redness over his left foot at the MTP join.  He wasn't able to walk, so he came to the ER. Evaluation in the ER showed hypotension with SBP of 90,  No leukocytosis, and fever of 101.8.  His ua showed no evidence of infection, but orange in color likely because of Pyridium.  His CXR was negative and his renal fx tests were normal.  He was given 1 L of IVF and hospitalist was asked to admit him for fever.  Rewiew of Systems:  Constitutional: Negative for  weight loss or weight gain Eyes: Negative for eye pain, redness and discharge, diplopia, visual changes, or flashes of light. ENMT: Negative for ear pain, hoarseness, nasal congestion, sinus pressure and sore throat. No headaches; tinnitus, drooling, or problem swallowing. Cardiovascular: Negative for chest pain, palpitations, diaphoresis, dyspnea and peripheral edema. ; No orthopnea, PND Respiratory: Negative for cough, hemoptysis, wheezing and stridor. No pleuritic chestpain. Gastrointestinal: Negative for nausea, vomiting, diarrhea, constipation, abdominal pain, melena, blood in stool, hematemesis, jaundice and rectal bleeding.    Genitourinary: Negative for frequency, dysuria, incontinence,flank pain and hematuria; Musculoskeletal: Negative for back pain and neck pain. Negative for swelling and trauma.;  Skin: . Negative for pruritus, rash, abrasions, bruising and skin lesion.; ulcerations Neuro: Negative for  headache, and neck stiffness. Negative for  altered level of consciousness , altered mental status, extremity weakness, burning feet, involuntary movement, seizure and syncope.  Psych: negative for anxiety, depression, insomnia, tearfulness, panic attacks, hallucinations, paranoia, suicidal or homicidal ideation    Past Medical History  Diagnosis Date  . Hypertension   . Gout     Acute episode involving ankle in 12/2011  . Hyperlipidemia   . Deep vein thrombophlebitis of left leg 11/2010    LLE; S/P THA  . Fasting hyperglycemia   . Stroke 08/2009; 07/2010    TIA in 07/2010  . H/O heat stroke 08/2009  . Prostate cancer 2013    adenocarcinoma; Gleason grade 8; PSA 9.45; EBRT 2013-14  . Renal cyst   . Syncope     01/2012: Negative pharmacologic stress nuclear  . PAF (paroxysmal atrial fibrillation)     a. first noted 01/2012 during admission for URI;  b. seen again 03/2012 after nstemi of unknown origin - pradaxa initiated.  Marland Kitchen CAD (coronary artery disease)     a. 01/2012 Elev Ti-->nonischemic MV;  b. 03/2012 NSTEMI/Cath: LM 50, LAD 50p/m, 92m/d, LCX nl, RI 60ost sup branch, RCA diff nonobs, EF 55%    Past Surgical History  Procedure Laterality Date  . Hip arthroplasty  12/08/2010    ARTHROPLASTY BIPOLAR HIP;  Surgeon-Olin; Left  . 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 I&d extremity  05/28/2011    Procedure: IRRIGATION AND DEBRIDEMENT EXTREMITY;  Surgeon: Sharma Covert, MD;  Location: [facility] OR;  Service: Orthopedics;  Laterality: Right;  I & D Right hand/wrist  .  Colonoscopy   07/18/2003    RMR: Rectum internal hemorrhoids.  Otherwise, normal rectum/ Left-sided diverticula.  Remainder of colonic mucosa-nl    Medications:  HOME MEDS: Prior to Admission medications   Medication Sig Start Date End Date Taking? Authorizing Provider  alendronate (FOSAMAX) 70 MG tablet Take 70 mg by mouth every 7 (seven) days.  11/02/11  Yes Historical Provider, MD  allopurinol  (ZYLOPRIM) 100 MG tablet Take 300 mg by mouth daily. 01/04/12  Yes Shanker Levora Dredge, MD  ascorbic acid (VITAMIN C) 500 MG tablet Take 500 mg by mouth every morning.    Yes Historical Provider, MD  aspirin EC 81 MG tablet Take 81 mg by mouth daily.   Yes Historical Provider, MD  calcium-vitamin D (OSCAL WITH D) 500-200 MG-UNIT per tablet Take 1 tablet by mouth every morning.    Yes Historical Provider, MD  cholecalciferol (VITAMIN D) 1000 UNITS tablet Take 1,000 Units by mouth daily.   Yes Historical Provider, MD  COD LIVER OIL PO Take by mouth daily.   Yes Historical Provider, MD  dabigatran (PRADAXA) 150 MG CAPS Take 1 capsule (150 mg total) by mouth every 12 (twelve) hours. 04/10/12  Yes Ok Anis, NP  diltiazem (CARDIZEM CD) 180 MG 24 hr capsule Take 1 capsule (180 mg total) by mouth daily. 04/10/12  Yes Ok Anis, NP  enalapril (VASOTEC) 5 MG tablet Take 2.5 mg by mouth every morning.    Yes Historical Provider, MD  fish oil-omega-3 fatty acids 1000 MG capsule Take 1 g by mouth daily.   Yes Historical Provider, MD  glipiZIDE (GLUCOTROL XL) 2.5 MG 24 hr tablet Take 2.5 mg by mouth daily.   Yes Historical Provider, MD  indomethacin (INDOCIN) 25 MG capsule Take 25 mg by mouth 3 (three) times daily.   Yes Historical Provider, MD  metoprolol (LOPRESSOR) 50 MG tablet Take 50 mg by mouth 2 (two) times daily.     Yes Historical Provider, MD  Multiple Vitamin (MULITIVITAMIN WITH MINERALS) TABS Take 1 tablet by mouth every morning.    Yes Historical Provider, MD  phenazopyridine (PYRIDIUM) 200 MG tablet Take 200 mg by mouth 3 (three) times daily.   Yes Historical Provider, MD  predniSONE (DELTASONE) 10 MG tablet Take 10 mg by mouth as directed.  01/04/12  Yes Shanker Levora Dredge, MD  simvastatin (ZOCOR) 20 MG tablet Take 20 mg by mouth at bedtime.    Yes Historical Provider, MD  tamsulosin (FLOMAX) 0.4 MG CAPS Take 0.4 mg by mouth daily.   Yes Historical Provider, MD  vitamin E 400 UNIT  capsule Take 400 Units by mouth every morning.    Yes Historical Provider, MD     Allergies:  Allergies  Allergen Reactions  . Tetanus Toxoids Swelling    Also allergic to "high powered pain medications."    Social History:   reports that he quit smoking about 64 years ago. His smoking use included Cigarettes and Cigars. He smoked 0.00 packs per day. His smokeless tobacco use includes Chew. He reports that he drinks about 0.5 ounces of alcohol per week. He reports that he does not use illicit drugs.  Family History: Family History  Problem Relation Age of Onset  . Prostate cancer Brother   . Colon cancer Sister 32  . Diabetes Mellitus II Father   . Stroke Father      Physical Exam: Filed Vitals:   04/12/12 2116 04/12/12 2211 04/12/12 2312  BP: 93/52  99/56  Pulse:  115  91  Temp: 100.1 F (37.8 C) 101.8 F (38.8 C) 100.9 F (38.3 C)  TempSrc: Oral Rectal Oral  Resp: 22  20  Height: 5\' 9"  (1.753 m)    Weight: 97.977 kg (216 lb)    SpO2: 94%  94%   Blood pressure 99/56, pulse 91, temperature 100.9 F (38.3 C), temperature source Oral, resp. rate 20, height 5\' 9"  (1.753 m), weight 97.977 kg (216 lb), SpO2 94.00%.  GEN:  Pleasant  patient lying in the stretcher in no acute distress; cooperative with exam. PSYCH:  alert and oriented x4; does not appear anxious or depressed; affect is appropriate. HEENT: Mucous membranes pink and anicteric; PERRLA; EOM intact; no cervical lymphadenopathy nor thyromegaly or carotid bruit; no JVD; There were no stridor. Neck is very supple. Breasts:: Not examined CHEST WALL: No tenderness CHEST: Normal respiration, clear to auscultation bilaterally.  HEART: Regular rate and rhythm.  There are no murmur, rub, or gallops.   BACK: No kyphosis or scoliosis; no CVA tenderness ABDOMEN: soft and non-tender; no masses, no organomegaly, normal abdominal bowel sounds; no pannus; no intertriginous candida. There is no rebound and no distention. Rectal  Exam: Not done EXTREMITIES: No bone or joint deformity; age-appropriate arthropathy of the hands and knees; no edema; no ulcerations.  There is no calf tenderness. He has tenderness over the MTP joint of the left foot. Genitalia: not examined PULSES: 2+ and symmetric SKIN: Normal hydration no rash or ulceration CNS: Cranial nerves 2-12 grossly intact no focal lateralizing neurologic deficit.  Speech is fluent; uvula elevated with phonation, facial symmetry and tongue midline. DTR are normal bilaterally, cerebella exam is intact, barbinski is negative and strengths are equaled bilaterally.  No sensory loss.   Labs on Admission:  Basic Metabolic Panel:  Recent Labs Lab 04/07/12 1730 04/08/12 0710 04/12/12 2157  NA 135 146* 138  K 3.6 4.1 4.0  CL 97 112 102  CO2 22 26 25   GLUCOSE 278* 76 105*  BUN 24* 19 15  CREATININE 0.95 0.93 1.14  CALCIUM 9.5 8.9 9.4   Liver Function Tests:  Recent Labs Lab 04/07/12 1730  AST 24  ALT 31  ALKPHOS 135*  BILITOT 0.3  PROT 6.5  ALBUMIN 3.3*    Recent Labs Lab 04/07/12 1730  LIPASE 28   No results found for this basename: AMMONIA,  in the last 168 hours CBC:  Recent Labs Lab 04/07/12 1730 04/08/12 0710 04/09/12 0438 04/10/12 0610 04/12/12 2157  WBC 11.6* 7.0 6.6 5.9 5.6  NEUTROABS 10.3*  --   --   --  4.1  HGB 12.8* 11.5* 11.4* 11.2* 11.6*  HCT 38.2* 33.5* 34.4* 32.3* 34.3*  MCV 103.0* 100.3* 103.9* 100.6* 102.4*  PLT 161 127* 125* 112* 141*   Cardiac Enzymes:  Recent Labs Lab 04/07/12 1730 04/07/12 1930 04/07/12 2116 04/08/12 0710 04/12/12 2157  TROPONINI <0.30 2.47* 3.81* 4.79* <0.30    CBG:  Recent Labs Lab 04/08/12 2130 04/09/12 0627 04/09/12 1113 04/09/12 1618 04/09/12 2211  GLUCAP 131* 86 94 143* 110*     Radiological Exams on Admission: Dg Chest 1 View  04/12/2012  *RADIOLOGY REPORT*  Clinical Data: Cough.  Fever.  Generalized weakness.  Current history of prostate cancer, atrial fibrillation,  coronary artery disease.  CHEST - 1 VIEW  Comparison: One-view chest x-ray 04/07/2012 and portable chest x- ray 01/02/2012 [facility].  Portable chest x-ray 02/07/2012, 02/04/2012 [facility].  Findings: Cardiac silhouette normal in size, unchanged.  Thoracic  aorta mildly tortuous, unchanged.  Hilar and mediastinal contours otherwise unremarkable.  Lungs clear.  Bronchovascular markings normal.  Pulmonary vascularity normal.  No pneumothorax.  No pleural effusions.  IMPRESSION: No acute cardiopulmonary disease.   Original Report Authenticated By: Hulan Saas, M.D.     Assessment/Plan Present on Admission:  . Acute gouty arthritis . Hyperlipidemia . Non-STEMI (non-ST elevated myocardial infarction) . Prostate cancer . Atrial fibrillation  PLAN:  I suspect he was given Diltiazem for his afib and HTN, along with stopping his prednisone abruptly causing him to have hypotension, and adrenal insufficiency.   His fever has no source except for the gout, so I think the low grade fever is indeed from the gout.  I will give him no antibiotics, but he had pan-cultures.  The pradaxa for his afib will be continued.  I will admit him for adrenal insufficiency and acute gout attack.  Will give stress dose steroids, gentle IVF, and resume his prednisone.  Because of his borderline low BP, I will hold his cardiazem and vasotec, but will continue his beta-blocker.  His BP will be monitored with SSI and he will be placed on carb modified diet.  He is stable, full code, and will be admitted to [facility] service.  I will give him narcotic and PRN toradol for his gout.  Thank you for allowing me to partake in the care of your nice patient.  I will put him on PPI prophylaxis as well.  Other plans as per orders.  Code Status:FULL CODE.   Houston Siren, MD. [facility] Pager [phone] 7pm to 7am.  04/12/2012, 11:59 PM

## 2012-04-12 NOTE — ED Provider Notes (Signed)
History     CSN: [id number]  Arrival date & time 04/12/12  2112   First MD Initiated Contact with Patient 04/12/12 2156      Chief Complaint  Patient presents with  . Fever     HPI Pt was seen at 2210.   Per pt and his family, c/o gradual onset and persistence of constant generalized weakness/fatigue that began today.  Pt's family states pt "was complaining he was cold" so they took his temp which was "100.8."  Pt denies recent chemotherapy for prostate CA; LD XRT approx 1 week ago.  Pt states he was recently evaluated by his PMD, and "was started on some new meds for my gout" in his ankles/feet.  Denies CP/palpitations, no SOB/cough, no abd pain, no N/V/D, no back pain, no rash.      Past Medical History  Diagnosis Date  . Hypertension   . Gout     Acute episode involving ankle in 12/2011  . Hyperlipidemia   . Deep vein thrombophlebitis of left leg 11/2010    LLE; S/P THA  . Fasting hyperglycemia   . Stroke 08/2009; 07/2010    TIA in 07/2010  . H/O heat stroke 08/2009  . Prostate cancer 2013    adenocarcinoma; Gleason grade 8; PSA 9.45; EBRT 2013-14  . Renal cyst   . Syncope     01/2012: Negative pharmacologic stress nuclear  . PAF (paroxysmal atrial fibrillation)     a. first noted 01/2012 during admission for URI;  b. seen again 03/2012 after nstemi of unknown origin - pradaxa initiated.  Marland Kitchen CAD (coronary artery disease)     a. 01/2012 Elev Ti-->nonischemic MV;  b. 03/2012 NSTEMI/Cath: LM 50, LAD 50p/m, 89m/d, LCX nl, RI 60ost sup branch, RCA diff nonobs, EF 55%    Past Surgical History  Procedure Laterality Date  . Hip arthroplasty  12/08/2010    ARTHROPLASTY BIPOLAR HIP;  Surgeon-Olin; Left  . 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 I&d extremity  05/28/2011    Procedure: IRRIGATION AND DEBRIDEMENT EXTREMITY;  Surgeon: Sharma Covert, MD;  Location: [facility] OR;  Service: Orthopedics;  Laterality: Right;  I & D Right hand/wrist  . Colonoscopy    07/18/2003    RMR: Rectum internal hemorrhoids.  Otherwise, normal rectum/ Left-sided diverticula.  Remainder of colonic mucosa-nl    Family History  Problem Relation Age of Onset  . Prostate cancer Brother   . Colon cancer Sister 75  . Diabetes Mellitus II Father   . Stroke Father     History  Substance Use Topics  . Smoking status: Former Smoker    Types: Cigarettes, Cigars    Quit date: 01/27/1948  . Smokeless tobacco: Current User    Types: Chew     Comment: "quit smoking cigarettes age 21 or 24"  . Alcohol Use: 0.5 oz/week    1 drink(s) per week     Review of Systems ROS: Statement: All systems negative except as marked or noted in the HPI; Constitutional: +fever and chills, generalized fatigue/weakness. ; ; Eyes: Negative for eye pain, redness and discharge. ; ; ENMT: Negative for ear pain, hoarseness, nasal congestion, sinus pressure and sore throat. ; ; Cardiovascular: Negative for chest pain, palpitations, diaphoresis, dyspnea and peripheral edema.; ; Respiratory: Negative for cough, wheezing and stridor. ; ; Gastrointestinal: Negative for nausea, vomiting, diarrhea, abdominal pain, blood in stool, hematemesis, jaundice and rectal bleeding. . ; ;  Genitourinary: Negative for dysuria, flank pain and hematuria. ; ; Musculoskeletal: Negative for back pain and neck pain. Negative for swelling and trauma.; ; Skin: Negative for pruritus, rash, abrasions, blisters, bruising and skin lesion.; ; Neuro: Negative for headache, lightheadedness and neck stiffness. Negative for altered level of consciousness , altered mental status, extremity weakness, paresthesias, involuntary movement, seizure and syncope.      Allergies  Tetanus toxoids  Home Medications   Current Outpatient Rx  Name  Route  Sig  Dispense  Refill  . alendronate (FOSAMAX) 70 MG tablet   Oral   Take 70 mg by mouth every 7 (seven) days.          Marland Kitchen allopurinol (ZYLOPRIM) 100 MG tablet   Oral   Take 300 mg by  mouth daily.         Marland Kitchen ascorbic acid (VITAMIN C) 500 MG tablet   Oral   Take 500 mg by mouth every morning.          Marland Kitchen aspirin EC 81 MG tablet   Oral   Take 81 mg by mouth daily.         . calcium-vitamin D (OSCAL WITH D) 500-200 MG-UNIT per tablet   Oral   Take 1 tablet by mouth every morning.          . cholecalciferol (VITAMIN D) 1000 UNITS tablet   Oral   Take 1,000 Units by mouth daily.         . COD LIVER OIL PO   Oral   Take by mouth daily.         . dabigatran (PRADAXA) 150 MG CAPS   Oral   Take 1 capsule (150 mg total) by mouth every 12 (twelve) hours.   60 capsule   6   . diltiazem (CARDIZEM CD) 180 MG 24 hr capsule   Oral   Take 1 capsule (180 mg total) by mouth daily.   30 capsule   6   . enalapril (VASOTEC) 5 MG tablet   Oral   Take 2.5 mg by mouth every morning.          . fish oil-omega-3 fatty acids 1000 MG capsule   Oral   Take 1 g by mouth daily.         Marland Kitchen glipiZIDE (GLUCOTROL XL) 2.5 MG 24 hr tablet   Oral   Take 2.5 mg by mouth daily.         . indomethacin (INDOCIN) 25 MG capsule   Oral   Take 25 mg by mouth 3 (three) times daily.         . metoprolol (LOPRESSOR) 50 MG tablet   Oral   Take 50 mg by mouth 2 (two) times daily.           . Multiple Vitamin (MULITIVITAMIN WITH MINERALS) TABS   Oral   Take 1 tablet by mouth every morning.          . phenazopyridine (PYRIDIUM) 200 MG tablet   Oral   Take 200 mg by mouth 3 (three) times daily.         . predniSONE (DELTASONE) 10 MG tablet   Oral   Take 10 mg by mouth as directed.          . simvastatin (ZOCOR) 20 MG tablet   Oral   Take 20 mg by mouth at bedtime.          . tamsulosin (FLOMAX) 0.4 MG CAPS  Oral   Take 0.4 mg by mouth daily.         . vitamin E 400 UNIT capsule   Oral   Take 400 Units by mouth every morning.            BP 93/52  Pulse 115  Temp(Src) 101.8 F (38.8 C) (Rectal)  Resp 22  Ht 5\' 9"  (1.753 m)  Wt 216 lb  (97.977 kg)  BMI 31.88 kg/m2  SpO2 94%  Physical Exam 2215: Physical examination:  Nursing notes reviewed; Vital signs and O2 SAT reviewed;  Constitutional: Well developed, Well nourished, In no acute distress; Head:  Normocephalic, atraumatic; Eyes: EOMI, PERRL, No scleral icterus; ENMT: Mouth and pharynx normal, Mucous membranes dry; Neck: Supple, Full range of motion, No lymphadenopathy; Cardiovascular: Tachycardic rate and rhythm, No gallop; Respiratory: Breath sounds clear & equal bilaterally, No rales, rhonchi, wheezes.  Speaking full sentences with ease, Normal respiratory effort/excursion; Chest: Nontender, Movement normal; Abdomen: Soft, Nontender, Nondistended, Normal bowel sounds;; Extremities: Pulses normal, No rash, +1 pedal edema bilat without calf asymmetry. No deformity.; Neuro: AA&Ox3, Major CN grossly intact.  No facial droop. Speech clear. No gross focal motor or sensory deficits in extremities.; Skin: Color pale, Warm, Dry.   ED Course  Procedures     MDM  MDM Reviewed: previous chart, nursing note and vitals Reviewed previous: ECG and labs Interpretation: ECG, labs and x-ray    Date: 04/12/2012  Rate: 93  Rhythm: normal sinus rhythm  QRS Axis: left  Intervals: PR prolonged  ST/T Wave abnormalities: normal  Conduction Disutrbances:right bundle branch block and left anterior fascicular block  Narrative Interpretation:   Old EKG Reviewed: unchanged; no significant changes from previous EKG dated 04/07/2012.   Results for orders placed during the hospital encounter of 04/12/12  URINALYSIS, ROUTINE W REFLEX MICROSCOPIC      Result Value Range   Color, Urine ORANGE (*) YELLOW   APPearance CLEAR  CLEAR   Specific Gravity, Urine 1.015  1.005 - 1.030   pH 6.0  5.0 - 8.0   Glucose, UA 100 (*) NEGATIVE mg/dL   Hgb urine dipstick NEGATIVE  NEGATIVE   Bilirubin Urine NEGATIVE  NEGATIVE   Ketones, ur NEGATIVE  NEGATIVE mg/dL   Protein, ur 30 (*) NEGATIVE mg/dL    Urobilinogen, UA 2.0 (*) 0.0 - 1.0 mg/dL   Nitrite POSITIVE (*) NEGATIVE   Leukocytes, UA NEGATIVE  NEGATIVE  CBC WITH DIFFERENTIAL      Result Value Range   WBC 5.6  4.0 - 10.5 K/uL   RBC 3.35 (*) 4.22 - 5.81 MIL/uL   Hemoglobin 11.6 (*) 13.0 - 17.0 g/dL   HCT 84.1 (*) 32.4 - 40.1 %   MCV 102.4 (*) 78.0 - 100.0 fL   MCH 34.6 (*) 26.0 - 34.0 pg   MCHC 33.8  30.0 - 36.0 g/dL   RDW 02.7  25.3 - 66.4 %   Platelets 141 (*) 150 - 400 K/uL   Neutrophils Relative 73  43 - 77 %   Neutro Abs 4.1  1.7 - 7.7 K/uL   Lymphocytes Relative 14  12 - 46 %   Lymphs Abs 0.8  0.7 - 4.0 K/uL   Monocytes Relative 11  3 - 12 %   Monocytes Absolute 0.6  0.1 - 1.0 K/uL   Eosinophils Relative 2  0 - 5 %   Eosinophils Absolute 0.1  0.0 - 0.7 K/uL   Basophils Relative 0  0 - 1 %  Basophils Absolute 0.0  0.0 - 0.1 K/uL  BASIC METABOLIC PANEL      Result Value Range   Sodium 138  135 - 145 mEq/L   Potassium 4.0  3.5 - 5.1 mEq/L   Chloride 102  96 - 112 mEq/L   CO2 25  19 - 32 mEq/L   Glucose, Bld 105 (*) 70 - 99 mg/dL   BUN 15  6 - 23 mg/dL   Creatinine, Ser 4.09  0.50 - 1.35 mg/dL   Calcium 9.4  8.4 - 81.1 mg/dL   GFR calc non Af Amer 60 (*) >90 mL/min   GFR calc Af Amer 69 (*) >90 mL/min  LACTIC ACID, PLASMA      Result Value Range   Lactic Acid, Venous 1.7  0.5 - 2.2 mmol/L  TROPONIN I      Result Value Range   Troponin I <0.30  <0.30 ng/mL  URINE MICROSCOPIC-ADD ON      Result Value Range   Squamous Epithelial / LPF RARE  RARE   WBC, UA 0-2  <3 WBC/hpf   RBC / HPF 3-6  <3 RBC/hpf   Dg Chest 1 View 04/12/2012  *RADIOLOGY REPORT*  Clinical Data: Cough.  Fever.  Generalized weakness.  Current history of prostate cancer, atrial fibrillation, coronary artery disease.  CHEST - 1 VIEW  Comparison: One-view chest x-ray 04/07/2012 and portable chest x- ray 01/02/2012 [facility].  Portable chest x-ray 02/07/2012, 02/04/2012 [facility].  Findings: Cardiac silhouette normal in  size, unchanged.  Thoracic aorta mildly tortuous, unchanged.  Hilar and mediastinal contours otherwise unremarkable.  Lungs clear.  Bronchovascular markings normal.  Pulmonary vascularity normal.  No pneumothorax.  No pleural effusions.  IMPRESSION: No acute cardiopulmonary disease.   Original Report Authenticated By: Hulan Saas, M.D.    Results for KERRI, ASCHE (MRN [id number]) as of 04/13/2012 00:01  Ref. Range 04/07/2012 17:30 04/08/2012 07:10 04/09/2012 04:38 04/10/2012 06:10 04/12/2012 21:57  Hemoglobin Latest Range: 13.0-17.0 g/dL 21.3 (L) 08.6 (L) 57.8 (L) 11.2 (L) 11.6 (L)  HCT Latest Range: 39.0-52.0 % 38.2 (L) 33.5 (L) 34.4 (L) 32.3 (L) 34.3 (L)   Results for LUKA, STOHR (MRN [id number]) as of 04/13/2012 00:04  Ref. Range 04/07/2012 17:30 04/08/2012 07:10 04/09/2012 04:38 04/10/2012 06:10 04/12/2012 21:57  Platelets Latest Range: 150-400 K/uL 161 127 (L) 125 (L) 112 (L) 141 (L)     2315:  APAP given for fever.  Pt on pyridium, likely cause for +nitrites in Udip; UC is pending.  Judicious IVF bolus given with improvement in SBP from 90 to 100. Pt unable to stand for orthostatic VS or CXR 2 view due to generalized weakness. No clear source for fever at this time. H/H and plts per baseline. Dx and testing d/w pt and family.  Questions answered.  Verb understanding, agreeable to admit.  T/C to [facility]. Conley Rolls, case discussed, including:  HPI, pertinent PM/SHx, VS/PE, dx testing, ED course and treatment:  Agreeable to observation admit, requests he will come to ED for eval.        Laray Anger, DO 04/14/12 1312

## 2012-04-12 NOTE — ED Notes (Signed)
Fever that started this evening that was 99.0 then went up to 100.8 per brother. He was just here last week and was [facility], did not have any blockages and did not get any stents per brother.

## 2012-04-12 NOTE — ED Notes (Signed)
Pt c/o fever that began today. Pt denies any pain or discomfort, NVD, weakness or any other symptoms. Pt has prostate cancer and had been receiving chemo tx up until this past Thursday when he was admitted to [facility] for cardiac reasons.

## 2012-04-13 ENCOUNTER — Encounter (HOSPITAL_COMMUNITY): Payer: Self-pay

## 2012-04-13 LAB — GLUCOSE, CAPILLARY: Glucose-Capillary: 149 mg/dL — ABNORMAL HIGH (ref 70–99)

## 2012-04-13 LAB — URINE CULTURE

## 2012-04-13 LAB — TSH: TSH: 1.666 u[IU]/mL (ref 0.350–4.500)

## 2012-04-13 MED ORDER — HYDROMORPHONE HCL PF 1 MG/ML IJ SOLN
1.0000 mg | INTRAMUSCULAR | Status: DC | PRN
  Administered 2012-04-13: 1 mg via INTRAVENOUS
  Filled 2012-04-13: qty 1

## 2012-04-13 MED ORDER — INDOMETHACIN 25 MG PO CAPS
25.0000 mg | ORAL_CAPSULE | Freq: Three times a day (TID) | ORAL | Status: DC
Start: 2012-04-13 — End: 2012-04-13
  Administered 2012-04-13: 25 mg via ORAL
  Filled 2012-04-13: qty 1

## 2012-04-13 MED ORDER — VITAMIN D 1000 UNITS PO TABS
1000.0000 [IU] | ORAL_TABLET | Freq: Every day | ORAL | Status: DC
  Administered 2012-04-13: 1000 [IU] via ORAL
  Filled 2012-04-13: qty 1

## 2012-04-13 MED ORDER — VITAMIN C 500 MG PO TABS
500.0000 mg | ORAL_TABLET | Freq: Every morning | ORAL | Status: DC
  Administered 2012-04-13: 500 mg via ORAL
  Filled 2012-04-13: qty 1

## 2012-04-13 MED ORDER — PREDNISONE 20 MG PO TABS
40.0000 mg | ORAL_TABLET | Freq: Every day | ORAL | Status: DC
  Administered 2012-04-13: 40 mg via ORAL
  Filled 2012-04-13: qty 2

## 2012-04-13 MED ORDER — DABIGATRAN ETEXILATE MESYLATE 150 MG PO CAPS
150.0000 mg | ORAL_CAPSULE | Freq: Two times a day (BID) | ORAL | Status: DC
  Administered 2012-04-13: 150 mg via ORAL
  Filled 2012-04-13 (×5): qty 1

## 2012-04-13 MED ORDER — SIMVASTATIN 20 MG PO TABS: 20.0000 mg | ORAL_TABLET | Freq: Every day | ORAL | Status: DC

## 2012-04-13 MED ORDER — METOPROLOL TARTRATE 50 MG PO TABS
50.0000 mg | ORAL_TABLET | Freq: Two times a day (BID) | ORAL | Status: DC
  Administered 2012-04-13: 50 mg via ORAL
  Filled 2012-04-13: qty 1

## 2012-04-13 MED ORDER — OMEGA-3-ACID ETHYL ESTERS 1 G PO CAPS
1.0000 g | ORAL_CAPSULE | Freq: Every day | ORAL | Status: DC
Start: 2012-04-13 — End: 2012-04-13

## 2012-04-13 MED ORDER — SODIUM CHLORIDE 0.9 % IJ SOLN: 3.0000 mL | Freq: Two times a day (BID) | INTRAMUSCULAR | Status: DC

## 2012-04-13 MED ORDER — OXYCODONE HCL 5 MG PO TABS: 5.0000 mg | ORAL_TABLET | ORAL | Status: DC | PRN

## 2012-04-13 MED ORDER — DOCUSATE SODIUM 100 MG PO CAPS
100.0000 mg | ORAL_CAPSULE | Freq: Two times a day (BID) | ORAL | Status: DC
  Administered 2012-04-13: 100 mg via ORAL
  Filled 2012-04-13: qty 1

## 2012-04-13 MED ORDER — TAMSULOSIN HCL 0.4 MG PO CAPS
0.4000 mg | ORAL_CAPSULE | Freq: Every day | ORAL | Status: DC
  Administered 2012-04-13: 0.4 mg via ORAL
  Filled 2012-04-13: qty 1

## 2012-04-13 MED ORDER — ONDANSETRON HCL 4 MG PO TABS: 4.0000 mg | ORAL_TABLET | Freq: Four times a day (QID) | ORAL | Status: DC | PRN

## 2012-04-13 MED ORDER — ALLOPURINOL 300 MG PO TABS
300.0000 mg | ORAL_TABLET | Freq: Every day | ORAL | Status: DC
  Administered 2012-04-13: 300 mg via ORAL
  Filled 2012-04-13: qty 1

## 2012-04-13 MED ORDER — PANTOPRAZOLE SODIUM 40 MG PO TBEC
40.0000 mg | DELAYED_RELEASE_TABLET | Freq: Every day | ORAL | Status: DC
  Administered 2012-04-13: 40 mg via ORAL
  Filled 2012-04-13 (×2): qty 1

## 2012-04-13 MED ORDER — INSULIN ASPART 100 UNIT/ML ~~LOC~~ SOLN: 0.0000 [IU] | Freq: Every day | SUBCUTANEOUS | Status: DC

## 2012-04-13 MED ORDER — INSULIN ASPART 100 UNIT/ML ~~LOC~~ SOLN
0.0000 [IU] | Freq: Three times a day (TID) | SUBCUTANEOUS | Status: DC
  Administered 2012-04-13: 2 [IU] via SUBCUTANEOUS
  Administered 2012-04-13: 3 [IU] via SUBCUTANEOUS

## 2012-04-13 MED ORDER — ASPIRIN EC 81 MG PO TBEC
81.0000 mg | DELAYED_RELEASE_TABLET | Freq: Every day | ORAL | Status: DC
  Administered 2012-04-13: 81 mg via ORAL
  Filled 2012-04-13: qty 1

## 2012-04-13 MED ORDER — PREDNISONE 10 MG PO TABS: 10.0000 mg | ORAL_TABLET | ORAL | Status: DC

## 2012-04-13 MED ORDER — GLIPIZIDE ER 2.5 MG PO TB24
2.5000 mg | ORAL_TABLET | Freq: Every day | ORAL | Status: DC
  Administered 2012-04-13: 2.5 mg via ORAL
  Filled 2012-04-13 (×3): qty 1

## 2012-04-13 MED ORDER — HYDROCORTISONE SOD SUCCINATE 100 MG IJ SOLR
50.0000 mg | Freq: Three times a day (TID) | INTRAMUSCULAR | Status: DC
  Administered 2012-04-13: 50 mg via INTRAVENOUS
  Filled 2012-04-13: qty 2

## 2012-04-13 MED ORDER — ONDANSETRON HCL 4 MG/2ML IJ SOLN: 4.0000 mg | Freq: Four times a day (QID) | INTRAMUSCULAR | Status: DC | PRN

## 2012-04-13 MED ORDER — KETOROLAC TROMETHAMINE 30 MG/ML IJ SOLN: 15.0000 mg | Freq: Four times a day (QID) | INTRAMUSCULAR | Status: DC | PRN

## 2012-04-13 MED ORDER — PANTOPRAZOLE SODIUM 40 MG PO TBEC: 40.0000 mg | DELAYED_RELEASE_TABLET | Freq: Every day | ORAL | Status: DC

## 2012-04-13 MED ORDER — ALENDRONATE SODIUM 70 MG PO TABS: 70.0000 mg | ORAL_TABLET | ORAL | Status: DC

## 2012-04-13 MED ORDER — CALCIUM CARBONATE-VITAMIN D 500-200 MG-UNIT PO TABS
1.0000 | ORAL_TABLET | Freq: Every morning | ORAL | Status: DC
Start: 2012-04-13 — End: 2012-04-13
  Administered 2012-04-13: 1 via ORAL
  Filled 2012-04-13: qty 1

## 2012-04-13 MED ORDER — HYDROCORTISONE SOD SUCCINATE 100 MG IJ SOLR: 50.0000 mg | Freq: Three times a day (TID) | INTRAMUSCULAR | Status: DC

## 2012-04-13 NOTE — Progress Notes (Signed)
UR Chart Review Completed  

## 2012-04-13 NOTE — Discharge Summary (Signed)
[name] C. Nefertiti Mohamad, MD, FACC [facility] Pager:  [phone]  

## 2012-04-13 NOTE — Progress Notes (Signed)
04/13/12 1307 Patient left floor in stable condition via w/c accompanied by nurse tech for discharge home. Earnstine Regal, RN

## 2012-04-13 NOTE — Progress Notes (Signed)
PHARMACIST - PHYSICIAN COMMUNICATION  CONCERNING: P&T Medication Policy Regarding Oral Bisphosphonates  RECOMMENDATION: Your order for alendronate (Fosamax), ibandronate (Boniva), or risedronate (Actonel) has been discontinued at this time.  If the patient's post-hospital medical condition warrants safe use of this class of drugs, please resume the pre-hospital regimen upon discharge.  DESCRIPTION:  Alendronate (Fosamax), ibandronate (Boniva), and risedronate (Actonel) can cause severe esophageal erosions in patients who are unable to remain upright at least 30 minutes after taking this medication.   Since brief interruptions in therapy are thought to have minimal impact on bone mineral density, the Pharmacy & Therapeutics Committee has established that bisphosphonate orders should be routinely discontinued during hospitalization.   To override this safety policy and permit administration of Boniva, Fosamax, or Actonel in the hospital, prescribers must write "DO NOT HOLD" in the comments section when placing the order for this class of medications.  Junita Push, PharmD, BCPS 04/13/2012@7 :41 AM

## 2012-04-13 NOTE — Discharge Summary (Signed)
Physician Discharge Summary  [name] Wingert ZOX:[id number] DOB: 1931-08-08 DOA: 04/12/2012  PCP: Aniceto Boss., MD  Admit date: 04/12/2012 Discharge date: 04/13/2012  Time spent: Greater than 30 minutes  Recommendations for Outpatient Follow-up:  1. Followup with primary care physician in the next week or so.  Discharge Diagnoses:  1. Acute gouty arthritis affecting right foot. Improved. 2. Low-grade fever, likely secondary to #1. 3. Hypotension likely secondary to adrenal insufficiency from discontinuation of steroids. Improved. 4. Coronary artery disease with recent non-ST elevation MI. Stable.   Discharge Condition: Stable and improved.  Diet recommendation: Heart healthy.  Filed Weights   04/12/12 2116 04/13/12 0118  Weight: 97.977 kg (216 lb) 99.4 kg (219 lb 2.2 oz)    History of present illness:  This 79 year old man was admitted yesterday with symptoms of fever and weakness. Please see initial history as outlined below: [name] is an 79 y.o. male with hx of nonobtructive CAD, recent cath, hx of gout on prednisone for over 6 months, afib, HTN, saw his cardiologist Dr Gloris Manchester, and was told to stop his prednisone and Indocin, and start on Pradaxa and Cardiozem 180mg / day. He subsequently felt weak and orthostatic symptomology, along with feeling low grade fever and increase pain and redness over his left foot at the MTP join. He wasn't able to walk, so he came to the ER. Evaluation in the ER showed hypotension with SBP of 90, No leukocytosis, and fever of 101.8. His ua showed no evidence of infection, but orange in color likely because of Pyridium. His CXR was negative and his renal fx tests were normal. He was given 1 L of IVF and hospitalist was asked to admit him for fever.  Hospital Course:  The patient was admitted yesterday and today feels much improved, 100%. He says he feels as well as he is felt for a long time. His fever is gone, there was no infectious source for it  and it was felt that this was likely due to his acute gout, his right foot also has improved dramatically in terms of pain. He is very keen to go home. He is stable.  Procedures:  None.   Consultations:  None.  Discharge Exam: Filed Vitals:   04/12/12 2211 04/12/12 2312 04/13/12 0118 04/13/12 0438  BP:  99/56 105/62 118/71  Pulse:  91 86 82  Temp: 101.8 F (38.8 C) 100.9 F (38.3 C) 98.8 F (37.1 C) 98.4 F (36.9 C)  TempSrc: Rectal Oral Oral Axillary  Resp:  20  20  Height:   5\' 9"  (1.753 m)   Weight:   99.4 kg (219 lb 2.2 oz)   SpO2:  94% 94% 92%    General: He looks systemically well. Is not toxic or septic. Cardiovascular: Heart sounds are present and normal without tachycardia, murmurs or gallop rhythm. Respiratory: Lung fields are clear. He is alert and oriented. Examination of both feet as not showing any evidence of gout.  Discharge Instructions  Discharge Orders   Future Appointments Provider Department Dept Phone   04/18/2012 2:40 PM Jodelle Gross, NP [facility] [phone]   Future Orders Complete By Expires     Diet - low sodium heart healthy  As directed     Increase activity slowly  As directed         Medication List    TAKE these medications       alendronate 70 MG tablet  Commonly known as:  FOSAMAX  Take 70  mg by mouth every 7 (seven) days.     allopurinol 100 MG tablet  Commonly known as:  ZYLOPRIM  Take 300 mg by mouth daily.     ascorbic acid 500 MG tablet  Commonly known as:  VITAMIN C  Take 500 mg by mouth every morning.     aspirin EC 81 MG tablet  Take 81 mg by mouth daily.     calcium-vitamin D 500-200 MG-UNIT per tablet  Commonly known as:  OSCAL WITH D  Take 1 tablet by mouth every morning.     cholecalciferol 1000 UNITS tablet  Commonly known as:  VITAMIN D  Take 1,000 Units by mouth daily.     COD LIVER OIL PO  Take by mouth daily.     dabigatran 150 MG Caps  Commonly known as:  PRADAXA   Take 1 capsule (150 mg total) by mouth every 12 (twelve) hours.     diltiazem 180 MG 24 hr capsule  Commonly known as:  CARDIZEM CD  Take 1 capsule (180 mg total) by mouth daily.     enalapril 5 MG tablet  Commonly known as:  VASOTEC  Take 2.5 mg by mouth every morning.     fish oil-omega-3 fatty acids 1000 MG capsule  Take 1 g by mouth daily.     glipiZIDE 2.5 MG 24 hr tablet  Commonly known as:  GLUCOTROL XL  Take 2.5 mg by mouth daily.     indomethacin 25 MG capsule  Commonly known as:  INDOCIN  Take 25 mg by mouth 3 (three) times daily.     metoprolol 50 MG tablet  Commonly known as:  LOPRESSOR  Take 50 mg by mouth 2 (two) times daily.     multivitamin with minerals Tabs  Take 1 tablet by mouth every morning.     oxyCODONE 5 MG immediate release tablet  Commonly known as:  Oxy IR/ROXICODONE  Take 1 tablet (5 mg total) by mouth every 4 (four) hours as needed.     pantoprazole 40 MG tablet  Commonly known as:  PROTONIX  Take 1 tablet (40 mg total) by mouth daily.     phenazopyridine 200 MG tablet  Commonly known as:  PYRIDIUM  Take 200 mg by mouth 3 (three) times daily.     predniSONE 10 MG tablet  Commonly known as:  DELTASONE  Take 10 mg by mouth as directed.     simvastatin 20 MG tablet  Commonly known as:  ZOCOR  Take 20 mg by mouth at bedtime.     tamsulosin 0.4 MG Caps  Commonly known as:  FLOMAX  Take 0.4 mg by mouth daily.     vitamin E 400 UNIT capsule  Take 400 Units by mouth every morning.          The results of significant diagnostics from this hospitalization (including imaging, microbiology, ancillary and laboratory) are listed below for reference.    Significant Diagnostic Studies: Dg Chest 1 View  04/12/2012  *RADIOLOGY REPORT*  Clinical Data: Cough.  Fever.  Generalized weakness.  Current history of prostate cancer, atrial fibrillation, coronary artery disease.  CHEST - 1 VIEW  Comparison: One-view chest x-ray 04/07/2012 and  portable chest x- ray 01/02/2012 [facility].  Portable chest x-ray 02/07/2012, 02/04/2012 [facility].  Findings: Cardiac silhouette normal in size, unchanged.  Thoracic aorta mildly tortuous, unchanged.  Hilar and mediastinal contours otherwise unremarkable.  Lungs clear.  Bronchovascular markings normal.  Pulmonary vascularity normal.  No pneumothorax.  No pleural effusions.  IMPRESSION: No acute cardiopulmonary disease.   Original Report Authenticated By: Hulan Saas, M.D.    Nm Pulmonary Perf And Vent  04/09/2012  *RADIOLOGY REPORT*  Clinical Data:  80 year old male with chest pain.  NUCLEAR MEDICINE VENTILATION - PERFUSION LUNG SCAN  Technique:  Wash-in, equilibrium, and wash-out phase ventilation images were obtained using Xe-133 gas.  Perfusion images were obtained in multiple projections after intravenous injection of Tc- 64m MAA.  Radiopharmaceuticals:  10.0 mCi Xe-133 gas and 6.0 mCi Tc-33m MAA.  Comparison:  04/08/2011 chest radiograph.  Findings: No perfusion abnormalities are identified.  There are no segmental perfusion defects identified. Ventilation is normal. No ventilation defects are present.  IMPRESSION: Normal pulmonary perfusion and ventilation.   Original Report Authenticated By: Harmon Pier, M.D.    Dg Chest Portable 1 View  04/07/2012  *RADIOLOGY REPORT*  Clinical Data: Chest pain, nausea  CHEST - 1 VIEW  Comparison:  02/07/2012  Findings: The heart size and mediastinal contours are within normal limits.  Both lungs are clear.  IMPRESSION: No active disease.   Original Report Authenticated By: Judie Petit. Miles Costain, M.D.     Microbiology: Recent Results (from the past 240 hour(s))  MRSA PCR SCREENING     Status: Abnormal   Collection Time    04/08/12  3:19 AM      Result Value Range Status   MRSA by PCR POSITIVE (*) NEGATIVE Final   Comment:            The GeneXpert MRSA Assay (FDA     approved for NASAL specimens     only), is one component of a      comprehensive MRSA colonization     surveillance program. It is not     intended to diagnose MRSA     infection nor to guide or     monitor treatment for     MRSA infections.     RESULT CALLED TO, READ BACK BY AND VERIFIED WITH:     F IDOWU,RN [id number] [facility]     CORRECT DATE [id number]     Labs: Basic Metabolic Panel:  Recent Labs Lab 04/07/12 1730 04/08/12 0710 04/12/12 2157  NA 135 146* 138  K 3.6 4.1 4.0  CL 97 112 102  CO2 22 26 25   GLUCOSE 278* 76 105*  BUN 24* 19 15  CREATININE 0.95 0.93 1.14  CALCIUM 9.5 8.9 9.4   Liver Function Tests:  Recent Labs Lab 04/07/12 1730  AST 24  ALT 31  ALKPHOS 135*  BILITOT 0.3  PROT 6.5  ALBUMIN 3.3*    Recent Labs Lab 04/07/12 1730  LIPASE 28    CBC:  Recent Labs Lab 04/07/12 1730 04/08/12 0710 04/09/12 0438 04/10/12 0610 04/12/12 2157  WBC 11.6* 7.0 6.6 5.9 5.6  NEUTROABS 10.3*  --   --   --  4.1  HGB 12.8* 11.5* 11.4* 11.2* 11.6*  HCT 38.2* 33.5* 34.4* 32.3* 34.3*  MCV 103.0* 100.3* 103.9* 100.6* 102.4*  PLT 161 127* 125* 112* 141*   Cardiac Enzymes:  Recent Labs Lab 04/07/12 1730 04/07/12 1930 04/07/12 2116 04/08/12 0710 04/12/12 2157  TROPONINI <0.30 2.47* 3.81* 4.79* <0.30     CBG:  Recent Labs Lab 04/09/12 0627 04/09/12 1113 04/09/12 1618 04/09/12 2211 04/13/12 0152  GLUCAP 86 94 143* 110* 110*       Signed:  Regine Christian C  [facility] 04/13/2012, 9:47 AM

## 2012-04-13 NOTE — Progress Notes (Signed)
04/13/12 1138 Reviewed discharge instructions with patient, given copy of instructions, medication list, prescriptions, f/u appointments as scheduled. Noted which medications already received this morning on list, pt verbalized understanding. Reviewed gout education sheet, when to call MD, verbalized understanding. IV site d/c'd and within normal limits. Pt states may need to reschedule f/u appointments due to radiation treatment schedule, preferred to call once home rather than nursing staff rescheduling. Pt in stable condition awaiting brother arrival for discharge home. Earnstine Regal, RN

## 2012-04-18 ENCOUNTER — Ambulatory Visit (INDEPENDENT_AMBULATORY_CARE_PROVIDER_SITE_OTHER): Payer: Medicare Other | Admitting: Adult Health

## 2012-04-18 ENCOUNTER — Encounter: Payer: Self-pay | Admitting: Adult Health

## 2012-04-18 VITALS — BP 118/60 | HR 82 | Ht 69.0 in | Wt 222.0 lb

## 2012-04-18 DIAGNOSIS — I4891 Unspecified atrial fibrillation: Secondary | ICD-10-CM

## 2012-04-18 DIAGNOSIS — I214 Non-ST elevation (NSTEMI) myocardial infarction: Secondary | ICD-10-CM

## 2012-04-18 DIAGNOSIS — M109 Gout, unspecified: Secondary | ICD-10-CM

## 2012-04-18 DIAGNOSIS — I10 Essential (primary) hypertension: Secondary | ICD-10-CM

## 2012-04-18 NOTE — Assessment & Plan Note (Signed)
Blood pressure is well-controlled, at 118/60. He remains on Cardizem, and enlapril and metoprolol. Denies any dizziness, lightheadedness, or presyncope. I noticing some lower extremity edema probably related to the Cardizem as he does keep his legs in a dependent position. He is also on Fosamax and steroids. Weight is essentially the same as on discharge. Will not make any changes at this time and continue to monitor him closely. We will see him again in one month.

## 2012-04-18 NOTE — Patient Instructions (Addendum)
Your physician recommends that you schedule a follow-up appointment in: 1 month  

## 2012-04-18 NOTE — Assessment & Plan Note (Signed)
He is being followed by primary care and as having an appointment tomorrow concerning his underlying gout symptoms.

## 2012-04-18 NOTE — Assessment & Plan Note (Signed)
Remains in normal sinus rhythm with arrival of a branch block. Rate is well-controlled. No changes in his medications at this time. He will continue on Cardizem. I believe some of his lower extremity edema may be related to use of this calcium channel blocker. Reluctant to place him on any diuretics at this time.

## 2012-04-18 NOTE — Assessment & Plan Note (Signed)
Cardiac catheterization completed during recent hospitalization on 04/12/2012 demonstrated nonobstructive CAD. Continue on ACE inhibitor, beta blocker, and aspirin. No further cardiac testing will be completed at this time. His ejection fraction was found to be normal at 60% per echocardiogram.

## 2012-04-18 NOTE — Progress Notes (Deleted)
Name: [name]    DOB: 09-Nov-1931  Age: 79 y.o.  MR#: [id number]       PCP:  Aniceto Boss., MD      Insurance: Payor: MEDICARE  Plan: MEDICARE PART A AND B  Product Type: *No Product type*    CC:    Chief Complaint  Patient presents with  . Atrial Fibrillation    PAF  . Coronary Artery Disease    Nonobstructive    VS Filed Vitals:   04/18/12 1506  BP: 118/60  Pulse: 82  Height: 5\' 9"  (1.753 m)  Weight: 222 lb (100.699 kg)  SpO2: 95%    Weights Current Weight  04/18/12 222 lb (100.699 kg)  04/13/12 219 lb 2.2 oz (99.4 kg)  04/08/12 224 lb 10.4 oz (101.9 kg)    Blood Pressure  BP Readings from Last 3 Encounters:  04/18/12 118/60  04/13/12 118/71  04/10/12 114/61     Admit date:  (Not on file) Last encounter with RMR:  Visit date not found   Allergy Tetanus toxoids  Current Outpatient Prescriptions  Medication Sig Dispense Refill  . allopurinol (ZYLOPRIM) 100 MG tablet Take 300 mg by mouth daily.      Marland Kitchen ascorbic acid (VITAMIN C) 500 MG tablet Take 500 mg by mouth every morning.       Marland Kitchen aspirin EC 81 MG tablet Take 81 mg by mouth daily.      . calcium-vitamin D (OSCAL WITH D) 500-200 MG-UNIT per tablet Take 1 tablet by mouth every morning.       . cholecalciferol (VITAMIN D) 1000 UNITS tablet Take 1,000 Units by mouth daily.      . COD LIVER OIL PO Take by mouth daily.      . dabigatran (PRADAXA) 150 MG CAPS Take 1 capsule (150 mg total) by mouth every 12 (twelve) hours.  60 capsule  6  . diltiazem (CARDIZEM CD) 180 MG 24 hr capsule Take 1 capsule (180 mg total) by mouth daily.  30 capsule  6  . enalapril (VASOTEC) 5 MG tablet Take 2.5 mg by mouth every morning.       . fish oil-omega-3 fatty acids 1000 MG capsule Take 1 g by mouth daily.      Marland Kitchen glipiZIDE (GLUCOTROL XL) 2.5 MG 24 hr tablet Take 2.5 mg by mouth daily.      . metoprolol (LOPRESSOR) 50 MG tablet Take 50 mg by mouth 2 (two) times daily.        . Multiple Vitamin (MULITIVITAMIN WITH MINERALS) TABS  Take 1 tablet by mouth every morning.       Marland Kitchen oxyCODONE (OXY IR/ROXICODONE) 5 MG immediate release tablet Take 1 tablet (5 mg total) by mouth every 4 (four) hours as needed.  30 tablet  0  . pantoprazole (PROTONIX) 40 MG tablet Take 1 tablet (40 mg total) by mouth daily.  30 tablet  0  . phenazopyridine (PYRIDIUM) 200 MG tablet Take 200 mg by mouth 3 (three) times daily.      . predniSONE (DELTASONE) 10 MG tablet Take 10 mg by mouth as directed.       . simvastatin (ZOCOR) 20 MG tablet Take 20 mg by mouth at bedtime.       . tamsulosin (FLOMAX) 0.4 MG CAPS Take 0.4 mg by mouth daily.      . vitamin E 400 UNIT capsule Take 400 Units by mouth every morning.       . indomethacin (INDOCIN) 25 MG  capsule Take 25 mg by mouth 3 (three) times daily.       No current facility-administered medications for this visit.    Discontinued Meds:    Medications Discontinued During This Encounter  Medication Reason  . alendronate (FOSAMAX) 70 MG tablet Error    Patient Active Problem List  Diagnosis  . Hypertension  . Gout  . Hyperlipidemia  . Deep vein thrombophlebitis of left leg  . Fasting hyperglycemia  . Cerebrovascular event  . Syncope  . Prostate cancer  . Atrial fibrillation  . Chest pain at rest  . Non-STEMI (non-ST elevated myocardial infarction)  . CAD (coronary artery disease)  . Acute gouty arthritis  . Adrenal insufficiency    LABS    Component Value Date/Time   NA 138 04/12/2012 2157   NA 146* 04/08/2012 0710   NA 135 04/07/2012 1730   NA 147 11/20/2011 1036   K 4.0 04/12/2012 2157   K 4.1 04/08/2012 0710   K 3.6 04/07/2012 1730   K 4.5 11/20/2011 1036   CL 102 04/12/2012 2157   CL 112 04/08/2012 0710   CL 97 04/07/2012 1730   CL 109 11/20/2011 1036   CO2 25 04/12/2012 2157   CO2 26 04/08/2012 0710   CO2 22 04/07/2012 1730   CO2 25 11/20/2011 1036   GLUCOSE 105* 04/12/2012 2157   GLUCOSE 76 04/08/2012 0710   GLUCOSE 278* 04/07/2012 1730   BUN 15 04/12/2012 2157   BUN 19  04/08/2012 0710   BUN 24* 04/07/2012 1730   BUN 11 11/20/2011 1036   CREATININE 1.14 04/12/2012 2157   CREATININE 0.93 04/08/2012 0710   CREATININE 0.95 04/07/2012 1730   CREATININE 0.98 12/01/2011 1552   CREATININE 1.10 11/20/2011 1036   CALCIUM 9.4 04/12/2012 2157   CALCIUM 8.9 04/08/2012 0710   CALCIUM 9.5 04/07/2012 1730   CALCIUM 10.4 11/20/2011 1036   GFRNONAA 60* 04/12/2012 2157   GFRNONAA 78* 04/08/2012 0710   GFRNONAA 78* 04/07/2012 1730   GFRAA 69* 04/12/2012 2157   GFRAA >90 04/08/2012 0710   GFRAA >90 04/07/2012 1730   CMP     Component Value Date/Time   NA 138 04/12/2012 2157   NA 147 11/20/2011 1036   K 4.0 04/12/2012 2157   K 4.5 11/20/2011 1036   CL 102 04/12/2012 2157   CL 109 11/20/2011 1036   CO2 25 04/12/2012 2157   CO2 25 11/20/2011 1036   GLUCOSE 105* 04/12/2012 2157   BUN 15 04/12/2012 2157   BUN 11 11/20/2011 1036   CREATININE 1.14 04/12/2012 2157   CREATININE 0.98 12/01/2011 1552   CALCIUM 9.4 04/12/2012 2157   CALCIUM 10.4 11/20/2011 1036   PROT 6.5 04/07/2012 1730   PROT 6.2 11/20/2011 1036   ALBUMIN 3.3* 04/07/2012 1730   AST 24 04/07/2012 1730   AST 25 11/20/2011 1036   ALT 31 04/07/2012 1730   ALKPHOS 135* 04/07/2012 1730   ALKPHOS 67 11/20/2011 1036   BILITOT 0.3 04/07/2012 1730   BILITOT 0.6 11/20/2011 1036   GFRNONAA 60* 04/12/2012 2157   GFRAA 69* 04/12/2012 2157       Component Value Date/Time   WBC 5.6 04/12/2012 2157   WBC 5.9 04/10/2012 0610   WBC 6.6 04/09/2012 0438   HGB 11.6* 04/12/2012 2157   HGB 11.2* 04/10/2012 0610   HGB 11.4* 04/09/2012 0438   HCT 34.3* 04/12/2012 2157   HCT 32.3* 04/10/2012 0610   HCT 34.4* 04/09/2012 0438   HCT 43 11/20/2011 1039  MCV 102.4* 04/12/2012 2157   MCV 100.6* 04/10/2012 0610   MCV 103.9* 04/09/2012 0438    Lipid Panel     Component Value Date/Time   CHOL 175 08/12/2010 0544   TRIG 161* 08/12/2010 0544   HDL 32* 08/12/2010 0544   CHOLHDL 5.5 08/12/2010 0544   VLDL 32 08/12/2010 0544   LDLCALC 111* 08/12/2010 0544     ABG    Component Value Date/Time   TCO2 23 11/09/2011 1433     Lab Results  Component Value Date   TSH 1.666 04/13/2012   BNP (last 3 results) No results found for this basename: PROBNP,  in the last 8760 hours Cardiac Panel (last 3 results) No results found for this basename: CKTOTAL, CKMB, TROPONINI, RELINDX,  in the last 72 hours  Iron/TIBC/Ferritin No results found for this basename: iron, tibc, ferritin     EKG Orders placed in visit on 04/18/12  . EKG 12-LEAD     Prior Assessment and Plan Problem List as of 04/18/2012     ICD-9-CM     Cardiology Problems   Hypertension   Last Assessment & Plan   03/15/2012 Office Visit Written 03/16/2012 12:23 PM by Kathlen Brunswick, MD     Blood pressure control has been very good when assessed over the past 3 months. Current therapy will be continued.    Hyperlipidemia   Last Assessment & Plan   03/15/2012 Office Visit Written 03/16/2012 12:23 PM by Kathlen Brunswick, MD     No recent assessment of lipids. Values in 2012 were somewhat suboptimal, but adequate in the absence of known coronary disease.  A repeat assessment would be useful and will be obtained.    Deep vein thrombophlebitis of left leg   Last Assessment & Plan   03/15/2012 Office Visit Written 03/16/2012 12:17 PM by Kathlen Brunswick, MD     DVT occurred postoperatively following total hip arthroplasty. Adequate course of anticoagulation has been completed. No further treatment required for this problem.    Cerebrovascular event   Syncope   Last Assessment & Plan   03/15/2012 Office Visit Written 03/16/2012 12:25 PM by Kathlen Brunswick, MD     Etiology of syncope uncertain; no further testing warranted at present.    Atrial fibrillation   Last Assessment & Plan   03/15/2012 Office Visit Written 03/16/2012 12:26 PM by Kathlen Brunswick, MD     Patient has significant risk factors for thromboembolism, but in the absence of documented recurrent arrhythmia, I would  continue to refrain from full anticoagulation.    Non-STEMI (non-ST elevated myocardial infarction)   CAD (coronary artery disease)     Other   Gout   Last Assessment & Plan   03/15/2012 Office Visit Written 03/16/2012 12:20 PM by Kathlen Brunswick, MD     Patient attributes loss of consciousness in hospital to pain related to gout. That process is now under relatively good control.    Fasting hyperglycemia   Last Assessment & Plan   03/15/2012 Office Visit Edited 03/17/2012  8:46 PM by Kathlen Brunswick, MD     FBGs obtained on an outpatient basis over the past few months range from 97-118. While measurement of a hemoglobin A1c level would be reassuring, it does not appear that pharmacologic therapy is warranted at present.    Prostate cancer   Last Assessment & Plan   03/15/2012 Office Visit Written 03/16/2012 12:24 PM by Kathlen Brunswick, MD  Currently receiving course of radiation therapy    Chest pain at rest   Acute gouty arthritis   Adrenal insufficiency       Imaging: Dg Chest 1 View  04/12/2012  *RADIOLOGY REPORT*  Clinical Data: Cough.  Fever.  Generalized weakness.  Current history of prostate cancer, atrial fibrillation, coronary artery disease.  CHEST - 1 VIEW  Comparison: One-view chest x-ray 04/07/2012 and portable chest x- ray 01/02/2012 [facility].  Portable chest x-ray 02/07/2012, 02/04/2012 [facility].  Findings: Cardiac silhouette normal in size, unchanged.  Thoracic aorta mildly tortuous, unchanged.  Hilar and mediastinal contours otherwise unremarkable.  Lungs clear.  Bronchovascular markings normal.  Pulmonary vascularity normal.  No pneumothorax.  No pleural effusions.  IMPRESSION: No acute cardiopulmonary disease.   Original Report Authenticated By: Hulan Saas, M.D.    Nm Pulmonary Perf And Vent  04/09/2012  *RADIOLOGY REPORT*  Clinical Data:  79 year old male with chest pain.  NUCLEAR MEDICINE VENTILATION - PERFUSION LUNG SCAN   Technique:  Wash-in, equilibrium, and wash-out phase ventilation images were obtained using Xe-133 gas.  Perfusion images were obtained in multiple projections after intravenous injection of Tc- 29m MAA.  Radiopharmaceuticals:  10.0 mCi Xe-133 gas and 6.0 mCi Tc-51m MAA.  Comparison:  04/08/2011 chest radiograph.  Findings: No perfusion abnormalities are identified.  There are no segmental perfusion defects identified. Ventilation is normal. No ventilation defects are present.  IMPRESSION: Normal pulmonary perfusion and ventilation.   Original Report Authenticated By: Harmon Pier, M.D.    Dg Chest Portable 1 View  04/07/2012  *RADIOLOGY REPORT*  Clinical Data: Chest pain, nausea  CHEST - 1 VIEW  Comparison:  02/07/2012  Findings: The heart size and mediastinal contours are within normal limits.  Both lungs are clear.  IMPRESSION: No active disease.   Original Report Authenticated By: Judie Petit. Miles Costain, M.D.

## 2012-04-18 NOTE — Progress Notes (Signed)
HPI: [name] is a 79 year old male patient of Dr. Thornton Bing we are following for ongoing assessment and management, status is hospitalization in the setting of non-ST elevation MI. The patient had cardiac catheterization revealing nonobstructive CAD, he also had evidence of atrial fibrillation post procedure and was restarted on Cardizem. The patient was receiving in the emergency room 2 days after discharge on 04/10/2012 for complaints of gouty arthritis and hypotension after being taken off of prednisone therapy with history of adrenal insufficiency.        Since release from the hospital the patient has continued to have gout symptoms. He is having some lower 70 edema associated with medications. He denies any chest pain dyspnea on exertion or weight gain. He is unable to wear his shoes due to the swelling in his feet secondary to gout. He is medically compliant with his prednisone and antihypertensive medications         Allergies  Allergen Reactions  . Tetanus Toxoids Swelling    Also allergic to "high powered pain medications."    Current Outpatient Prescriptions  Medication Sig Dispense Refill  . allopurinol (ZYLOPRIM) 100 MG tablet Take 300 mg by mouth daily.      Marland Kitchen ascorbic acid (VITAMIN C) 500 MG tablet Take 500 mg by mouth every morning.       Marland Kitchen aspirin EC 81 MG tablet Take 81 mg by mouth daily.      . calcium-vitamin D (OSCAL WITH D) 500-200 MG-UNIT per tablet Take 1 tablet by mouth every morning.       . cholecalciferol (VITAMIN D) 1000 UNITS tablet Take 1,000 Units by mouth daily.      . COD LIVER OIL PO Take by mouth daily.      . dabigatran (PRADAXA) 150 MG CAPS Take 1 capsule (150 mg total) by mouth every 12 (twelve) hours.  60 capsule  6  . diltiazem (CARDIZEM CD) 180 MG 24 hr capsule Take 1 capsule (180 mg total) by mouth daily.  30 capsule  6  . enalapril (VASOTEC) 5 MG tablet Take 2.5 mg by mouth every morning.       . fish oil-omega-3 fatty acids 1000 MG  capsule Take 1 g by mouth daily.      Marland Kitchen glipiZIDE (GLUCOTROL XL) 2.5 MG 24 hr tablet Take 2.5 mg by mouth daily.      . metoprolol (LOPRESSOR) 50 MG tablet Take 50 mg by mouth 2 (two) times daily.        . Multiple Vitamin (MULITIVITAMIN WITH MINERALS) TABS Take 1 tablet by mouth every morning.       Marland Kitchen oxyCODONE (OXY IR/ROXICODONE) 5 MG immediate release tablet Take 1 tablet (5 mg total) by mouth every 4 (four) hours as needed.  30 tablet  0  . pantoprazole (PROTONIX) 40 MG tablet Take 1 tablet (40 mg total) by mouth daily.  30 tablet  0  . phenazopyridine (PYRIDIUM) 200 MG tablet Take 200 mg by mouth 3 (three) times daily.      . predniSONE (DELTASONE) 10 MG tablet Take 10 mg by mouth as directed.       . simvastatin (ZOCOR) 20 MG tablet Take 20 mg by mouth at bedtime.       . tamsulosin (FLOMAX) 0.4 MG CAPS Take 0.4 mg by mouth daily.      . vitamin E 400 UNIT capsule Take 400 Units by mouth every morning.       . indomethacin (INDOCIN) 25  MG capsule Take 25 mg by mouth 3 (three) times daily.       No current facility-administered medications for this visit.    Past Medical History  Diagnosis Date  . Hypertension   . Gout     Acute episode involving ankle in 12/2011  . Hyperlipidemia   . Deep vein thrombophlebitis of left leg 11/2010    LLE; S/P THA  . Fasting hyperglycemia   . Stroke 08/2009; 07/2010    TIA in 07/2010  . H/O heat stroke 08/2009  . Prostate cancer 2013    adenocarcinoma; Gleason grade 8; PSA 9.45; EBRT 2013-14  . Renal cyst   . Syncope     01/2012: Negative pharmacologic stress nuclear  . PAF (paroxysmal atrial fibrillation)     a. first noted 01/2012 during admission for URI;  b. seen again 03/2012 after nstemi of unknown origin - pradaxa initiated.  Marland Kitchen CAD (coronary artery disease)     a. 01/2012 Elev Ti-->nonischemic MV;  b. 03/2012 NSTEMI/Cath: LM 50, LAD 50p/m, 85m/d, LCX nl, RI 60ost sup branch, RCA diff nonobs, EF 55%    Past Surgical History  Procedure  Laterality Date  . Hip arthroplasty  12/08/2010    ARTHROPLASTY BIPOLAR HIP;  Surgeon-Olin; Left  . 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 I&d extremity  05/28/2011    Procedure: IRRIGATION AND DEBRIDEMENT EXTREMITY;  Surgeon: Sharma Covert, MD;  Location: [facility] OR;  Service: Orthopedics;  Laterality: Right;  I & D Right hand/wrist  . Colonoscopy   07/18/2003    RMR: Rectum internal hemorrhoids.  Otherwise, normal rectum/ Left-sided diverticula.  Remainder of colonic mucosa-nl    GMW:NUUVOZ of systems complete and found to be negative unless listed above   PHYSICAL EXAM BP 118/60  Pulse 82  Ht 5\' 9"  (1.753 m)  Wt 222 lb (100.699 kg)  BMI 32.77 kg/m2  SpO2 95% General: Well developed, well nourished, in no acute distress Head: Eyes PERRLA, No xanthomas.   Normal cephalic and atramatic  Lungs: Clear bilaterally to auscultation and percussion. Heart: HRRR S1 S2, without MRG.  Pulses are 2+ & equal.            No carotid bruit. No JVD.  No abdominal bruits. No femoral bruits. Abdomen: Bowel sounds are positive, abdomen soft and non-tender without masses or                  Hernia's noted. Msk:  Back normal, normal gait. Normal strength and tone for age. Extremities: No clubbing, cyanosis or 2+ pitting edema bilateral ankles and right foot. Toe nails are yellow.  DP +1 Neuro: Alert and oriented X 3. Psych:  Good affect, responds appropriately  EKG: Normal sinus rhythm with right bundle branch block, rate of 83 beats per minute.  ASSESSMENT AND PLAN

## 2012-05-04 ENCOUNTER — Observation Stay (HOSPITAL_COMMUNITY)
Admission: EM | Admit: 2012-05-04 | Discharge: 2012-05-05 | Disposition: A | Discharge status: Home / Self Care | Payer: Medicare Other | Attending: Internal Medicine | Admitting: Internal Medicine

## 2012-05-04 ENCOUNTER — Encounter (HOSPITAL_COMMUNITY): Payer: Self-pay | Admitting: *Deleted

## 2012-05-04 ENCOUNTER — Emergency Department (HOSPITAL_COMMUNITY): Payer: Medicare Other

## 2012-05-04 DIAGNOSIS — R079 Chest pain, unspecified: Principal | ICD-10-CM | POA: Diagnosis present

## 2012-05-04 DIAGNOSIS — I10 Essential (primary) hypertension: Secondary | ICD-10-CM | POA: Diagnosis present

## 2012-05-04 DIAGNOSIS — R7301 Impaired fasting glucose: Secondary | ICD-10-CM

## 2012-05-04 DIAGNOSIS — I2510 Atherosclerotic heart disease of native coronary artery without angina pectoris: Secondary | ICD-10-CM | POA: Diagnosis present

## 2012-05-04 DIAGNOSIS — E2740 Unspecified adrenocortical insufficiency: Secondary | ICD-10-CM

## 2012-05-04 DIAGNOSIS — IMO0002 Reserved for concepts with insufficient information to code with codable children: Secondary | ICD-10-CM

## 2012-05-04 DIAGNOSIS — I214 Non-ST elevation (NSTEMI) myocardial infarction: Secondary | ICD-10-CM

## 2012-05-04 DIAGNOSIS — C61 Malignant neoplasm of prostate: Secondary | ICD-10-CM

## 2012-05-04 DIAGNOSIS — I80202 Phlebitis and thrombophlebitis of unspecified deep vessels of left lower extremity: Secondary | ICD-10-CM

## 2012-05-04 DIAGNOSIS — E785 Hyperlipidemia, unspecified: Secondary | ICD-10-CM | POA: Diagnosis present

## 2012-05-04 DIAGNOSIS — R55 Syncope and collapse: Secondary | ICD-10-CM

## 2012-05-04 DIAGNOSIS — M109 Gout, unspecified: Secondary | ICD-10-CM | POA: Diagnosis present

## 2012-05-04 DIAGNOSIS — I4891 Unspecified atrial fibrillation: Secondary | ICD-10-CM | POA: Diagnosis present

## 2012-05-04 LAB — COMPREHENSIVE METABOLIC PANEL
ALT: 47 U/L (ref 0–53)
AST: 27 U/L (ref 0–37)
CO2: 23 mEq/L (ref 19–32)
Calcium: 10.1 mg/dL (ref 8.4–10.5)
Potassium: 3.7 mEq/L (ref 3.5–5.1)
Sodium: 140 mEq/L (ref 135–145)
Total Protein: 6.6 g/dL (ref 6.0–8.3)

## 2012-05-04 LAB — CBC WITH DIFFERENTIAL/PLATELET
Basophils Absolute: 0.1 10*3/uL (ref 0.0–0.1)
Eosinophils Relative: 2 % (ref 0–5)
Lymphocytes Relative: 12 % (ref 12–46)
MCV: 104 fL — ABNORMAL HIGH (ref 78.0–100.0)
Neutrophils Relative %: 77 % (ref 43–77)
Platelets: 177 10*3/uL (ref 150–400)
RDW: 15.2 % (ref 11.5–15.5)
WBC: 11.2 10*3/uL — ABNORMAL HIGH (ref 4.0–10.5)

## 2012-05-04 MED ORDER — NITROGLYCERIN 2 % TD OINT
0.5000 [in_us] | TOPICAL_OINTMENT | Freq: Once | TRANSDERMAL | Status: AC
  Administered 2012-05-04: 0.5 [in_us] via TOPICAL
  Filled 2012-05-04: qty 1

## 2012-05-04 MED ORDER — SODIUM CHLORIDE 0.9 % IV SOLN
Freq: Once | INTRAVENOUS | Status: AC
  Administered 2012-05-04: 20 mL/h via INTRAVENOUS

## 2012-05-04 NOTE — ED Provider Notes (Signed)
History    This chart was scribed for Scott Lennert, MD by Marlyne Beards, ED Scribe. The patient was seen in room APA10/APA10. [name] care was started at 7:56 PM.    CSN: [id number]  Arrival date & time 05/04/12  1948   First MD Initiated Contact with Patient 05/04/12 1956      Chief Complaint  Patient presents with  . Chest Pain    (Consider location/radiation/quality/duration/timing/severity/associated sxs/prior treatment) Patient is a 79 y.o. male presenting with chest pain. The history is provided by the patient and a relative. No language interpreter was used.  Chest Pain Pain location:  L chest Pain severity:  Moderate  [name] is a 79 y.o. male with h/o HTN, Gout, DVT, PAF, and CAD who presents to the Emergency Department complaining of moderate constant chest pain onset today this afternoon. Pt states he vomited once today and pain location seems to focused on the left side of his chest. Pt took 3 nitro glycerin and anti acids with no immediate relief. He currently is in no distress in the ED and pain is resolved. Pt lives with his wife (she is on dialysis 3 days a week). He uses a walker when ambulatory. Pt had an MI in March and was at [facility] for 3 days. Pt denies fever, chills, cough, nausea, vomiting, diarrhea, SOB, weakness, and any other associated symptoms. Pt currently is undergoing radiation treatment for prostate cancer. Pt is currently taking 10mg  of Prednisone. Pt's PCP is Dr. Harland Dingwall.   Past Medical History  Diagnosis Date  . Hypertension   . Gout     Acute episode involving ankle in 12/2011  . Hyperlipidemia   . Deep vein thrombophlebitis of left leg 11/2010    LLE; S/P THA  . Fasting hyperglycemia   . Stroke 08/2009; 07/2010    TIA in 07/2010  . H/O heat stroke 08/2009  . Prostate cancer 2013    adenocarcinoma; Gleason grade 8; PSA 9.45; EBRT 2013-14  . Renal cyst   . Syncope     01/2012: Negative pharmacologic stress nuclear  . PAF (paroxysmal  atrial fibrillation)     a. first noted 01/2012 during admission for URI;  b. seen again 03/2012 after nstemi of unknown origin - pradaxa initiated.  Marland Kitchen CAD (coronary artery disease)     a. 01/2012 Elev Ti-->nonischemic MV;  b. 03/2012 NSTEMI/Cath: LM 50, LAD 50p/m, 29m/d, LCX nl, RI 60ost sup branch, RCA diff nonobs, EF 55%    Past Surgical History  Procedure Laterality Date  . Hip arthroplasty  12/08/2010    ARTHROPLASTY BIPOLAR HIP;  Surgeon-Olin; Left  . 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 I&d extremity  05/28/2011    Procedure: IRRIGATION AND DEBRIDEMENT EXTREMITY;  Surgeon: Sharma Covert, MD;  Location: [facility] OR;  Service: Orthopedics;  Laterality: Right;  I & D Right hand/wrist  . Colonoscopy   07/18/2003    RMR: Rectum internal hemorrhoids.  Otherwise, normal rectum/ Left-sided diverticula.  Remainder of colonic mucosa-nl    Family History  Problem Relation Age of Onset  . Prostate cancer Brother   . Colon cancer Sister 63  . Diabetes Mellitus II Father   . Stroke Father     History  Substance Use Topics  . Smoking status: Former Smoker    Types: Cigarettes, Cigars    Quit date: 01/27/1948  . Smokeless tobacco: Current User  Types: Chew     Comment: "quit smoking cigarettes age 60 or 56"  . Alcohol Use: No      Review of Systems  Cardiovascular: Positive for chest pain.  All other systems reviewed and are negative.    Allergies  Tetanus toxoids  Home Medications   Current Outpatient Rx  Name  Route  Sig  Dispense  Refill  . allopurinol (ZYLOPRIM) 100 MG tablet   Oral   Take 300 mg by mouth daily.         Marland Kitchen ascorbic acid (VITAMIN C) 500 MG tablet   Oral   Take 500 mg by mouth every morning.          Marland Kitchen aspirin EC 81 MG tablet   Oral   Take 81 mg by mouth daily.         . calcium-vitamin D (OSCAL WITH D) 500-200 MG-UNIT per tablet   Oral   Take 1 tablet by mouth every morning.          . cholecalciferol  (VITAMIN D) 1000 UNITS tablet   Oral   Take 1,000 Units by mouth daily.         . COD LIVER OIL PO   Oral   Take by mouth daily.         . dabigatran (PRADAXA) 150 MG CAPS   Oral   Take 1 capsule (150 mg total) by mouth every 12 (twelve) hours.   60 capsule   6   . diltiazem (CARDIZEM CD) 180 MG 24 hr capsule   Oral   Take 1 capsule (180 mg total) by mouth daily.   30 capsule   6   . enalapril (VASOTEC) 5 MG tablet   Oral   Take 2.5 mg by mouth every morning.          . fish oil-omega-3 fatty acids 1000 MG capsule   Oral   Take 1 g by mouth daily.         Marland Kitchen glipiZIDE (GLUCOTROL XL) 2.5 MG 24 hr tablet   Oral   Take 2.5 mg by mouth daily.         . indomethacin (INDOCIN) 25 MG capsule   Oral   Take 25 mg by mouth 3 (three) times daily.         . metoprolol (LOPRESSOR) 50 MG tablet   Oral   Take 50 mg by mouth 2 (two) times daily.           . Multiple Vitamin (MULITIVITAMIN WITH MINERALS) TABS   Oral   Take 1 tablet by mouth every morning.          Marland Kitchen oxyCODONE (OXY IR/ROXICODONE) 5 MG immediate release tablet   Oral   Take 1 tablet (5 mg total) by mouth every 4 (four) hours as needed.   30 tablet   0   . pantoprazole (PROTONIX) 40 MG tablet   Oral   Take 1 tablet (40 mg total) by mouth daily.   30 tablet   0   . phenazopyridine (PYRIDIUM) 200 MG tablet   Oral   Take 200 mg by mouth 3 (three) times daily.         . predniSONE (DELTASONE) 10 MG tablet   Oral   Take 10 mg by mouth as directed.          . simvastatin (ZOCOR) 20 MG tablet   Oral   Take 20 mg by mouth at bedtime.          Marland Kitchen  tamsulosin (FLOMAX) 0.4 MG CAPS   Oral   Take 0.4 mg by mouth daily.         . vitamin E 400 UNIT capsule   Oral   Take 400 Units by mouth every morning.            BP 137/87  Pulse 82  Temp(Src) 97.9 F (36.6 C)  Resp 24  Wt 219 lb (99.338 kg)  BMI 32.33 kg/m2  SpO2 96%  Physical Exam  Nursing note and vitals  reviewed. Constitutional: He is oriented to person, place, and time. He appears well-developed.  General weakness   HENT:  Head: Normocephalic.  Eyes: Conjunctivae and EOM are normal. No scleral icterus.  Neck: Neck supple. No thyromegaly present.  Cardiovascular: Normal rate and regular rhythm.  Exam reveals no gallop and no friction rub.   No murmur heard. Pulmonary/Chest: No stridor. He has no wheezes. He has no rales. He exhibits no tenderness.  Abdominal: He exhibits no distension. There is no tenderness. There is no rebound.  Musculoskeletal: Normal range of motion. He exhibits no edema.  Lymphadenopathy:    He has no cervical adenopathy.  Neurological: He is oriented to person, place, and time. Coordination normal.  Skin: No rash noted. No erythema.  Psychiatric: He has a normal mood and affect. His behavior is normal.    ED Course  Procedures (including critical care time) DIAGNOSTIC STUDIES: Oxygen Saturation is 96% on room air, adequate by my interpretation.    COORDINATION OF CARE: 8:05 PM Discussed ED treatment with pt and pt agrees.  10:05 PM Pt states pain moved location in his upper abdomen area. Pt states he has passed some gas. Discussed lab and xray results with pt and pt agrees.   Labs Reviewed  CBC WITH DIFFERENTIAL - Abnormal; Notable for the following:    WBC 11.2 (*)    RBC 3.47 (*)    Hemoglobin 11.8 (*)    HCT 36.1 (*)    MCV 104.0 (*)    Neutro Abs 8.7 (*)    All other components within normal limits  COMPREHENSIVE METABOLIC PANEL - Abnormal; Notable for the following:    Glucose, Bld 109 (*)    BUN 31 (*)    Alkaline Phosphatase 134 (*)    GFR calc non Af Amer 58 (*)    GFR calc Af Amer 68 (*)    All other components within normal limits  TROPONIN I  PRO B NATRIURETIC PEPTIDE   Dg Chest Port 1 View  05/04/2012  *RADIOLOGY REPORT*  Clinical Data: Shortness of breath.  PORTABLE CHEST - 1 VIEW  Comparison: One-view chest 04/12/2012  Findings:  The heart size is normal.  The lung volumes are low.  The no focal airspace disease is evident.  Mild pulmonary vascular congestion is evident.  IMPRESSION:  1.  Mild pulmonary vascular congestion. 2.  No focal airspace disease.   Original Report Authenticated By: Marin Roberts, M.D.      No diagnosis found.   Date: 05/04/2012  Rate80  Rhythm: normal sinus rhythm  QRS Axis: left  Intervals: normal  ST/T Wave abnormalities: nonspecific ST changes  Conduction Disutrbances:right bundle branch block  Narrative Interpretation:   Old EKG Reviewed: unchanged     MDM   The chart was scribed for me under my direct supervision.  I personally performed the history, physical, and medical decision making and all procedures in the evaluation of this patient.Marland Kitchen  Scott Lennert, MD 05/04/12 2227

## 2012-05-04 NOTE — ED Notes (Addendum)
Patient passing gas and pain is decreasing.  Patient questioned about last BM - states "took care of that yesterday"  Good results

## 2012-05-04 NOTE — ED Notes (Signed)
CP began this afternoon.  Pt took 3 SL nitro with no relief.  Reports vomiting once.  Pain on left side of chest.  No SOB.

## 2012-05-05 ENCOUNTER — Encounter (HOSPITAL_COMMUNITY): Payer: Self-pay | Admitting: Internal Medicine

## 2012-05-05 DIAGNOSIS — R079 Chest pain, unspecified: Secondary | ICD-10-CM

## 2012-05-05 DIAGNOSIS — I2510 Atherosclerotic heart disease of native coronary artery without angina pectoris: Secondary | ICD-10-CM

## 2012-05-05 LAB — CBC
MCH: 34.7 pg — ABNORMAL HIGH (ref 26.0–34.0)
Platelets: 144 10*3/uL — ABNORMAL LOW (ref 150–400)
RBC: 3 MIL/uL — ABNORMAL LOW (ref 4.22–5.81)
WBC: 8 10*3/uL (ref 4.0–10.5)

## 2012-05-05 LAB — TSH: TSH: 1.507 u[IU]/mL (ref 0.350–4.500)

## 2012-05-05 LAB — COMPREHENSIVE METABOLIC PANEL
Albumin: 2.9 g/dL — ABNORMAL LOW (ref 3.5–5.2)
Alkaline Phosphatase: 104 U/L (ref 39–117)
BUN: 28 mg/dL — ABNORMAL HIGH (ref 6–23)
Potassium: 3.4 mEq/L — ABNORMAL LOW (ref 3.5–5.1)
Sodium: 140 mEq/L (ref 135–145)
Total Protein: 5.2 g/dL — ABNORMAL LOW (ref 6.0–8.3)

## 2012-05-05 LAB — HEMOGLOBIN A1C: Hgb A1c MFr Bld: 5.6 % (ref <5.7)

## 2012-05-05 LAB — TROPONIN I: Troponin I: <0.3 ng/mL (ref <0.30)

## 2012-05-05 LAB — MAGNESIUM: Magnesium: 2 mg/dL (ref 1.5–2.5)

## 2012-05-05 LAB — MRSA PCR SCREENING: MRSA by PCR: POSITIVE — AB

## 2012-05-05 MED ORDER — SODIUM CHLORIDE 0.9 % IV SOLN: 250.0000 mL | INTRAVENOUS | Status: DC | PRN

## 2012-05-05 MED ORDER — ENALAPRIL MALEATE 5 MG PO TABS: 2.5000 mg | ORAL_TABLET | Freq: Every morning | ORAL | Status: DC

## 2012-05-05 MED ORDER — ALLOPURINOL 300 MG PO TABS: 300.0000 mg | ORAL_TABLET | Freq: Every day | ORAL | Status: DC

## 2012-05-05 MED ORDER — GLIPIZIDE ER 2.5 MG PO TB24
2.5000 mg | ORAL_TABLET | Freq: Every day | ORAL | Status: DC
  Administered 2012-05-05: 2.5 mg via ORAL
  Filled 2012-05-05 (×2): qty 1

## 2012-05-05 MED ORDER — CHLORHEXIDINE GLUCONATE CLOTH 2 % EX PADS
6.0000 | MEDICATED_PAD | Freq: Every day | CUTANEOUS | Status: DC
  Administered 2012-05-05: 6 via TOPICAL

## 2012-05-05 MED ORDER — SODIUM CHLORIDE 0.9 % IJ SOLN: 3.0000 mL | Freq: Two times a day (BID) | INTRAMUSCULAR | Status: DC

## 2012-05-05 MED ORDER — BISACODYL 10 MG RE SUPP: 10.0000 mg | Freq: Every day | RECTAL | Status: DC | PRN

## 2012-05-05 MED ORDER — SIMVASTATIN 20 MG PO TABS: 20.0000 mg | ORAL_TABLET | Freq: Every day | ORAL | Status: DC

## 2012-05-05 MED ORDER — SODIUM CHLORIDE 0.9 % IJ SOLN
3.0000 mL | Freq: Two times a day (BID) | INTRAMUSCULAR | Status: DC
  Administered 2012-05-05: 3 mL via INTRAVENOUS

## 2012-05-05 MED ORDER — DILTIAZEM HCL ER COATED BEADS 180 MG PO CP24: 180.0000 mg | ORAL_CAPSULE | Freq: Every day | ORAL | Status: DC

## 2012-05-05 MED ORDER — ONDANSETRON HCL 4 MG/2ML IJ SOLN: 4.0000 mg | Freq: Four times a day (QID) | INTRAMUSCULAR | Status: DC | PRN

## 2012-05-05 MED ORDER — DABIGATRAN ETEXILATE MESYLATE 150 MG PO CAPS
150.0000 mg | ORAL_CAPSULE | Freq: Two times a day (BID) | ORAL | Status: DC
  Filled 2012-05-05 (×4): qty 1

## 2012-05-05 MED ORDER — SODIUM CHLORIDE 0.9 % IJ SOLN: 3.0000 mL | INTRAMUSCULAR | Status: DC | PRN

## 2012-05-05 MED ORDER — ONDANSETRON HCL 4 MG PO TABS: 4.0000 mg | ORAL_TABLET | Freq: Four times a day (QID) | ORAL | Status: DC | PRN

## 2012-05-05 MED ORDER — FUROSEMIDE 20 MG PO TABS: 20.0000 mg | ORAL_TABLET | Freq: Every day | ORAL | Status: DC

## 2012-05-05 MED ORDER — POTASSIUM CHLORIDE CRYS ER 20 MEQ PO TBCR
40.0000 meq | EXTENDED_RELEASE_TABLET | Freq: Once | ORAL | Status: AC
  Administered 2012-05-05: 40 meq via ORAL
  Filled 2012-05-05: qty 2

## 2012-05-05 MED ORDER — NITROGLYCERIN 0.4 MG SL SUBL: 0.4000 mg | SUBLINGUAL_TABLET | SUBLINGUAL | Status: DC | PRN

## 2012-05-05 MED ORDER — METOPROLOL TARTRATE 50 MG PO TABS
50.0000 mg | ORAL_TABLET | Freq: Two times a day (BID) | ORAL | Status: DC
  Filled 2012-05-05: qty 1

## 2012-05-05 MED ORDER — MUPIROCIN 2 % EX OINT: 1.0000 "application " | TOPICAL_OINTMENT | Freq: Two times a day (BID) | CUTANEOUS | Status: DC

## 2012-05-05 MED ORDER — FLEET ENEMA 7-19 GM/118ML RE ENEM: 1.0000 | ENEMA | Freq: Once | RECTAL | Status: DC | PRN

## 2012-05-05 MED ORDER — ASPIRIN EC 81 MG PO TBEC: 81.0000 mg | DELAYED_RELEASE_TABLET | Freq: Every day | ORAL | Status: DC

## 2012-05-05 NOTE — Discharge Summary (Signed)
Physician Discharge Summary  Chrsitopher Wik [name] NWG:[id number] DOB: [birth year] DOA: 05/04/2012  PCP: Aniceto Boss., MD  Admit date: 05/04/2012 Discharge date: 05/05/2012  Time spent: Less than 30 minutes  Recommendations for Outpatient Follow-up:  1. Follow with primary care physician.  Discharge Diagnoses:  1. Chest pain, resolved, serial cardiac enzymes negative. 2. History of coronary artery disease, recent cardiac catheterization with decision for no further intervention at this point. 3. Gout, active, due to see primary care physician for this.   Discharge Condition: Stable and improved.  Diet recommendation: Heart healthy diet.  Filed Weights   05/04/12 1954 05/04/12 2340 05/05/12 0700  Weight: 99.338 kg (219 lb) 97.297 kg (214 lb 8 oz) 97.9 kg (215 lb 13.3 oz)    History of present illness:  This 79 year old man presents to the hospital yesterday with symptoms of sudden onset of chest pain. Please see initial history as outlined below : [name] is an 79 y.o. male. Elderly obese Caucasian gentleman with a history of coronary artery disease, atrial fibrillation, reports sudden onset of left precordial chest pain right after eating supper at around 4pm. The pain tended to move towards his abdomen but not all the way down, was not associated dizziness diaphoresis palpitation nausea or vomiting; he felt was gas but it was not relieved by antacid; it was not relieved by nitroglycerin at home.  Eventually came to the emergency room at about 7:45 PM, received a nitroglycerin patch; became very fluctuant in the emergency room began passing large amounts of gas below, and he said this gave complete relief of the pain.  Prior to this the hospitalist service was contacted to bring the patient in on observation for cardiac chest pain  Hospital Course:  The patient's chest pain has completely resolved after passing gas. Serial cardiac enzymes are negative. He was recently admitted for  similar symptoms and underwent cardiac catheterization which showed nonobstructive coronary artery disease. Ejection fraction was 60%. No further cardiac testing was going to be contemplated at that time. He is now stable for discharge.  Procedures:  None.  Consultations:  None.  Discharge Exam: Filed Vitals:   05/04/12 2246 05/04/12 2340 05/05/12 0500 05/05/12 0700  BP: 114/81 142/64 117/69   Pulse: 76 67 76   Temp:  97.9 F (36.6 C) 98 F (36.7 C)   TempSrc:  Oral Oral   Resp: 24 18 15    Height:  5\' 9"  (1.753 m)    Weight:  97.297 kg (214 lb 8 oz)  97.9 kg (215 lb 13.3 oz)  SpO2: 95% 96% 94%     General: He looks systemically well. He does not appear to be in pain. Cardiovascular: Heart sounds are present and normal without murmurs. Respiratory: Lung fields are clear. He is alert and oriented  Discharge Instructions  Discharge Orders   Future Appointments Provider Department Dept Phone   05/19/2012 1:00 PM Jodelle Gross, NP [facility] [place] [phone]   Future Orders Complete By Expires     Diet - low sodium heart healthy  As directed     Increase activity slowly  As directed         Medication List    TAKE these medications       allopurinol 300 MG tablet  Commonly known as:  ZYLOPRIM  Take 300 mg by mouth daily.     ascorbic acid 500 MG tablet  Commonly known as:  VITAMIN C  Take 500 mg by mouth every morning.  aspirin EC 81 MG tablet  Take 81 mg by mouth daily.     calcium-vitamin D 500-200 MG-UNIT per tablet  Commonly known as:  OSCAL WITH D  Take 1 tablet by mouth every morning.     cholecalciferol 1000 UNITS tablet  Commonly known as:  VITAMIN D  Take 1,000 Units by mouth daily.     COD LIVER OIL PO  Take by mouth daily.     dabigatran 150 MG Caps  Commonly known as:  PRADAXA  Take 1 capsule (150 mg total) by mouth every 12 (twelve) hours.     diltiazem 180 MG 24 hr capsule  Commonly known as:  CARDIZEM CD  Take  1 capsule (180 mg total) by mouth daily.     enalapril 5 MG tablet  Commonly known as:  VASOTEC  Take 2.5 mg by mouth every morning.     furosemide 20 MG tablet  Commonly known as:  LASIX  Take 20 mg by mouth daily.     glipiZIDE 2.5 MG 24 hr tablet  Commonly known as:  GLUCOTROL XL  Take 2.5 mg by mouth daily.     metoprolol 50 MG tablet  Commonly known as:  LOPRESSOR  Take 50 mg by mouth 2 (two) times daily.     multivitamin with minerals Tabs  Take 1 tablet by mouth every morning.     simvastatin 20 MG tablet  Commonly known as:  ZOCOR  Take 20 mg by mouth at bedtime.     vitamin E 400 UNIT capsule  Take 400 Units by mouth every morning.          The results of significant diagnostics from this hospitalization (including imaging, microbiology, ancillary and laboratory) are listed below for reference.    Significant Diagnostic Studies: Dg Chest 1 View  04/12/2012  *RADIOLOGY REPORT*  Clinical Data: Cough.  Fever.  Generalized weakness.  Current history of prostate cancer, atrial fibrillation, coronary artery disease.  CHEST - 1 VIEW  Comparison: One-view chest x-ray 04/07/2012 and portable chest x- ray 01/02/2012 [facility].  Portable chest x-ray 02/07/2012, 02/04/2012 [facility].  Findings: Cardiac silhouette normal in size, unchanged.  Thoracic aorta mildly tortuous, unchanged.  Hilar and mediastinal contours otherwise unremarkable.  Lungs clear.  Bronchovascular markings normal.  Pulmonary vascularity normal.  No pneumothorax.  No pleural effusions.  IMPRESSION: No acute cardiopulmonary disease.   Original Report Authenticated By: Hulan Saas, M.D.    Nm Pulmonary Perf And Vent  04/09/2012  *RADIOLOGY REPORT*  Clinical Data:  79 year old male with chest pain.  NUCLEAR MEDICINE VENTILATION - PERFUSION LUNG SCAN  Technique:  Wash-in, equilibrium, and wash-out phase ventilation images were obtained using Xe-133 gas.  Perfusion images were  obtained in multiple projections after intravenous injection of Tc- 38m MAA.  Radiopharmaceuticals:  10.0 mCi Xe-133 gas and 6.0 mCi Tc-79m MAA.  Comparison:  04/08/2011 chest radiograph.  Findings: No perfusion abnormalities are identified.  There are no segmental perfusion defects identified. Ventilation is normal. No ventilation defects are present.  IMPRESSION: Normal pulmonary perfusion and ventilation.   Original Report Authenticated By: Harmon Pier, M.D.    Dg Chest Port 1 View  05/04/2012  *RADIOLOGY REPORT*  Clinical Data: Shortness of breath.  PORTABLE CHEST - 1 VIEW  Comparison: One-view chest 04/12/2012  Findings: The heart size is normal.  The lung volumes are low.  The no focal airspace disease is evident.  Mild pulmonary vascular congestion is evident.  IMPRESSION:  1.  Mild pulmonary vascular congestion. 2.  No focal airspace disease.   Original Report Authenticated By: Marin Roberts, M.D.    Dg Chest Portable 1 View  04/07/2012  *RADIOLOGY REPORT*  Clinical Data: Chest pain, nausea  CHEST - 1 VIEW  Comparison:  02/07/2012  Findings: The heart size and mediastinal contours are within normal limits.  Both lungs are clear.  IMPRESSION: No active disease.   Original Report Authenticated By: Judie Petit. Miles Costain, M.D.     Microbiology: Recent Results (from the past 240 hour(s))  MRSA PCR SCREENING     Status: Abnormal   Collection Time    05/04/12 11:48 PM      Result Value Range Status   MRSA by PCR POSITIVE (*) NEGATIVE Final   Comment:            The GeneXpert MRSA Assay (FDA     approved for NASAL specimens     only), is one component of a     comprehensive MRSA colonization     surveillance program. It is not     intended to diagnose MRSA     infection nor to guide or     monitor treatment for     MRSA infections.     RESULT CALLED TO, READ BACK BY AND VERIFIED WITH:     BULLOCK T AT 0142 ON [id number] BY FORSYTH K     Labs: Basic Metabolic Panel:  Recent Labs Lab  05/04/12 2006 05/05/12 0504  NA 140 140  K 3.7 3.4*  CL 104 108  CO2 23 23  GLUCOSE 109* 73  BUN 31* 28*  CREATININE 1.16 1.01  CALCIUM 10.1 9.6  MG  --  2.0   Liver Function Tests:  Recent Labs Lab 05/04/12 2006 05/05/12 0504  AST 27 17  ALT 47 33  ALKPHOS 134* 104  BILITOT 0.3 0.4  PROT 6.6 5.2*  ALBUMIN 3.7 2.9*     CBC:  Recent Labs Lab 05/04/12 2006 05/05/12 0504  WBC 11.2* 8.0  NEUTROABS 8.7*  --   HGB 11.8* 10.4*  HCT 36.1* 31.6*  MCV 104.0* 105.3*  PLT 177 144*   Cardiac Enzymes:  Recent Labs Lab 05/04/12 2006 05/05/12 0047 05/05/12 0504  TROPONINI <0.30 <0.30 <0.30   BNP: BNP (last 3 results)  Recent Labs  05/04/12 2006  PROBNP 353.7         Signed:  Wilson Singer  [facility] 05/05/2012, 8:31 AM

## 2012-05-05 NOTE — H&P (Signed)
[facility] History and Physical  [name]  ZOX:[id number]  DOB: [birth year]   DOA: 05/04/2012   PCP:   Aniceto Boss., MD  Cardiologist: Gadsden Bing M.D.  Chief Complaint:  Sudden onset of chest pain 4 PM today   HPI: [name] is an 79 y.o. male.   Elderly obese Caucasian gentleman with a history of coronary artery disease, atrial fibrillation, reports sudden onset of left precordial chest pain right after eating supper at around 4pm. The pain tended to move towards his abdomen but not all the way down, was not associated dizziness diaphoresis palpitation nausea or vomiting; he felt was gas but it was not relieved by antacid; it was not relieved by nitroglycerin at home.  Eventually came to the emergency room at about 7:45 PM, received a nitroglycerin patch; became very fluctuant in the emergency room began passing large amounts of gas below, and he said this gave complete relief of the pain. Prior to this the hospitalist service was contacted to bring the patient in on observation for cardiac chest pain  Rewiew of Systems:   All systems negative except as marked bold or noted in the HPI;  Constitutional:    malaise, fever and chills. ;  Eyes:   eye pain, redness and discharge. ;  ENMT:   ear pain, hoarseness, nasal congestion, sinus pressure and sore throat. ; Hard of hearing Cardiovascular:    chest pain, palpitations, diaphoresis, dyspnea and peripheral edema.  Respiratory:   cough, hemoptysis, wheezing and stridor. ;  Gastrointestinal:  nausea, vomiting, diarrhea, constipation, abdominal pain, melena, blood in stool, hematemesis, jaundice and rectal bleeding. unusual weight loss..   Genitourinary:    frequency, dysuria, incontinence,flank pain and hematuria; Musculoskeletal:   back pain and neck pain.  swelling and trauma.; gout in his feet Skin: .  pruritus, rash, abrasions, bruising and skin lesion.; ulcerations Neuro:    headache, lightheadedness and neck  stiffness.  weakness, altered level of consciousness, altered mental status, extremity weakness, burning feet, involuntary movement, seizure and syncope.  Psych:    anxiety, depression, insomnia, tearfulness, panic attacks, hallucinations, paranoia, suicidal or homicidal ideation    Past Medical History  Diagnosis Date  . Hypertension   . Gout     Acute episode involving ankle in 12/2011  . Hyperlipidemia   . Deep vein thrombophlebitis of left leg 11/2010    LLE; S/P THA  . Fasting hyperglycemia   . Stroke 08/2009; 07/2010    TIA in 07/2010  . H/O heat stroke 08/2009  . Prostate cancer 2013    adenocarcinoma; Gleason grade 8; PSA 9.45; EBRT 2013-14  . Renal cyst   . Syncope     01/2012: Negative pharmacologic stress nuclear  . PAF (paroxysmal atrial fibrillation)     a. first noted 01/2012 during admission for URI;  b. seen again 03/2012 after nstemi of unknown origin - pradaxa initiated.  Marland Kitchen CAD (coronary artery disease)     a. 01/2012 Elev Ti-->nonischemic MV;  b. 03/2012 NSTEMI/Cath: LM 50, LAD 50p/m, 64m/d, LCX nl, RI 60ost sup branch, RCA diff nonobs, EF 55%    Past Surgical History  Procedure Laterality Date  . Hip arthroplasty  12/08/2010    ARTHROPLASTY BIPOLAR HIP;  Surgeon-Olin; Left  . 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 I&d extremity  05/28/2011    Procedure: IRRIGATION AND DEBRIDEMENT EXTREMITY;  Surgeon: Sharma Covert, MD;  Location: [facility]  OR;  Service: Orthopedics;  Laterality: Right;  I & D Right hand/wrist  . Colonoscopy   07/18/2003    RMR: Rectum internal hemorrhoids.  Otherwise, normal rectum/ Left-sided diverticula.  Remainder of colonic mucosa-nl    Medications:  HOME MEDS: Prior to Admission medications   Medication Sig Start Date End Date Taking? Authorizing Provider  allopurinol (ZYLOPRIM) 300 MG tablet Take 300 mg by mouth daily.   Yes Historical Provider, MD  ascorbic acid (VITAMIN C) 500 MG tablet Take 500 mg by mouth  every morning.    Yes Historical Provider, MD  aspirin EC 81 MG tablet Take 81 mg by mouth daily.   Yes Historical Provider, MD  calcium-vitamin D (OSCAL WITH D) 500-200 MG-UNIT per tablet Take 1 tablet by mouth every morning.    Yes Historical Provider, MD  cholecalciferol (VITAMIN D) 1000 UNITS tablet Take 1,000 Units by mouth daily.   Yes Historical Provider, MD  COD LIVER OIL PO Take by mouth daily.   Yes Historical Provider, MD  dabigatran (PRADAXA) 150 MG CAPS Take 1 capsule (150 mg total) by mouth every 12 (twelve) hours. 04/10/12  Yes Ok Anis, NP  diltiazem (CARDIZEM CD) 180 MG 24 hr capsule Take 1 capsule (180 mg total) by mouth daily. 04/10/12  Yes Ok Anis, NP  enalapril (VASOTEC) 5 MG tablet Take 2.5 mg by mouth every morning.    Yes Historical Provider, MD  furosemide (LASIX) 20 MG tablet Take 20 mg by mouth daily.   Yes Historical Provider, MD  glipiZIDE (GLUCOTROL XL) 2.5 MG 24 hr tablet Take 2.5 mg by mouth daily.   Yes Historical Provider, MD  metoprolol (LOPRESSOR) 50 MG tablet Take 50 mg by mouth 2 (two) times daily.     Yes Historical Provider, MD  Multiple Vitamin (MULITIVITAMIN WITH MINERALS) TABS Take 1 tablet by mouth every morning.    Yes Historical Provider, MD  simvastatin (ZOCOR) 20 MG tablet Take 20 mg by mouth at bedtime.    Yes Historical Provider, MD  vitamin E 400 UNIT capsule Take 400 Units by mouth every morning.    Yes Historical Provider, MD     Allergies:  Allergies  Allergen Reactions  . Tetanus Toxoids Swelling    Also allergic to "high powered pain medications."    Social History:   reports that he quit smoking about 64 years ago. His smoking use included Cigarettes and Cigars. He smoked 0.00 packs per day. His smokeless tobacco use includes Chew. He reports that he does not drink alcohol or use illicit drugs.  Family History: Family History  Problem Relation Age of Onset  . Prostate cancer Brother   . Colon cancer Sister 50   . Diabetes Mellitus II Father   . Stroke Father      Physical Exam: Filed Vitals:   05/04/12 2030 05/04/12 2100 05/04/12 2130 05/04/12 2246  BP: 110/55 110/72 90/47 114/81  Pulse: 68 69 68 76  Temp:      Resp: 19 24 21 24   Weight:      SpO2: 93% 93% 91% 95%   Blood pressure 114/81, pulse 76, temperature 97.9 F (36.6 C), resp. rate 24, weight 99.338 kg (219 lb), SpO2 95.00%.  GEN:  Pleasant obese elderly Caucasian gentleman lying bed in no acute distress; cooperative with exam PSYCH:  alert and oriented x4; neither anxious or depressed; affect is appropriate. HEENT: Mucous membranes pink and anicteric; PERRLA; EOM intact; no cervical lymphadenopathy nor thyromegaly or carotid bruit;  no JVD; thick neck Breasts:: Not examined CHEST WALL: No tenderness CHEST: Normal respiration, clear to auscultation bilaterally HEART: Regular rate and rhythm; no murmurs rubs or gallops BACK: No kyphosis no scoliosis; no CVA tenderness ABDOMEN: Obese, soft non-tender; no masses, no organomegaly, normal abdominal bowel sounds; no pannus; no intertriginous candida. Rectal Exam: Not done EXTREMITIES: ; age-appropriate arthropathy of the hands and knees; no edema; no ulcerations. Genitalia: not examined PULSES: 2+ and symmetric SKIN: Normal hydration; occasional ecchymotic rash of the limbs associated with chronic anticoagulant CNS: Cranial nerves 2-12 grossly intact no focal lateralizing neurologic deficit   Labs on Admission:  Basic Metabolic Panel:  Recent Labs Lab 05/04/12 2006  NA 140  K 3.7  CL 104  CO2 23  GLUCOSE 109*  BUN 31*  CREATININE 1.16  CALCIUM 10.1   Liver Function Tests:  Recent Labs Lab 05/04/12 2006  AST 27  ALT 47  ALKPHOS 134*  BILITOT 0.3  PROT 6.6  ALBUMIN 3.7   No results found for this basename: LIPASE, AMYLASE,  in the last 168 hours No results found for this basename: AMMONIA,  in the last 168 hours CBC:  Recent Labs Lab 05/04/12 2006  WBC  11.2*  NEUTROABS 8.7*  HGB 11.8*  HCT 36.1*  MCV 104.0*  PLT 177   Cardiac Enzymes:  Recent Labs Lab 05/04/12 2006  TROPONINI <0.30   BNP: No components found with this basename: POCBNP,  D-dimer: No components found with this basename: D-DIMER,  CBG: No results found for this basename: GLUCAP,  in the last 168 hours  Radiological Exams on Admission: Dg Chest Port 1 View  05/04/2012  *RADIOLOGY REPORT*  Clinical Data: Shortness of breath.  PORTABLE CHEST - 1 VIEW  Comparison: One-view chest 04/12/2012  Findings: The heart size is normal.  The lung volumes are low.  The no focal airspace disease is evident.  Mild pulmonary vascular congestion is evident.  IMPRESSION:  1.  Mild pulmonary vascular congestion. 2.  No focal airspace disease.   Original Report Authenticated By: Marin Roberts, M.D.     EKG: Independently reviewed. Sinus rhythm no acute ST segment changes   Assessment/Plan Present on Admission:  . Chest pain at rest . Gout . Hypertension . Atrial fibrillation . CAD (coronary artery disease) . Hyperlipidemia Nonsmoking tobacco abuse  PLAN: We will continue to cycle this gentleman is cardiac enzymes, to rule out an acute coronary syndrome; consider a cardiology evaluation prior to discharge if enzymes are negative. Alternatively followup cardiologist as outpatient  Continue symptomatic pain medication; he reports some mild gouty flare and is due for a visit to his rheumatologist in [place] at 11 AM this morning  Continue rate controlling medications, continue anti-coagulation,  Counsel against tobacco use, he reports he is cutting down  Continue management of his other chronic medical conditions  Other plans as per orders.  Code Status: FULL CODE  Family Communication: Discussed with patient at bedside Disposition Plan: Likely home in the morning   Kym Scannell Nocturnist [facility] Pager [phone]   05/05/2012, 12:44 AM

## 2012-05-05 NOTE — Progress Notes (Signed)
Pt. D/c instructions provided to family and pt. No change in medications, no new prescriptions, tele removed, IV removed, pt. Escorted to entrance with family. Pt. Stable for d/c at this time, followup with cardiology scheduled.

## 2012-05-11 ENCOUNTER — Encounter (HOSPITAL_COMMUNITY): Payer: Self-pay | Admitting: Emergency Medicine

## 2012-05-19 ENCOUNTER — Encounter: Payer: Self-pay | Admitting: Adult Health

## 2012-05-19 ENCOUNTER — Ambulatory Visit (INDEPENDENT_AMBULATORY_CARE_PROVIDER_SITE_OTHER): Payer: Medicare Other | Admitting: Adult Health

## 2012-05-19 VITALS — BP 119/67 | HR 71 | Ht 69.0 in | Wt 211.0 lb

## 2012-05-19 DIAGNOSIS — I4891 Unspecified atrial fibrillation: Secondary | ICD-10-CM

## 2012-05-19 DIAGNOSIS — I2510 Atherosclerotic heart disease of native coronary artery without angina pectoris: Secondary | ICD-10-CM

## 2012-05-19 DIAGNOSIS — I10 Essential (primary) hypertension: Secondary | ICD-10-CM

## 2012-05-19 NOTE — Assessment & Plan Note (Signed)
Early the patient is in normal sinus rhythm rate of 71 beats per minute with a right bundle-branch block. He remains rate control and could accept. We will not make any changes at this time if he is doing very well from a cardiac standpoint. See him again in 6 months unless becomes symptomatic.

## 2012-05-19 NOTE — Assessment & Plan Note (Signed)
Recent ER visit was negative for cardiac etiology for chest discomfort. The patient states that he had eaten some bad food which causes a great deal of stomach pain and gas. This was relieved with frequent flatus after use of nitroglycerin. He has had no recurrence of the discomfort.

## 2012-05-19 NOTE — Progress Notes (Deleted)
Name: [name]    DOB: 1931/08/11  Age: 79 y.o.  MR#: [id number]       PCP:  Aniceto Boss., MD      Insurance: Payor: MEDICARE  Plan: MEDICARE PART A AND B  Product Type: *No Product type*    CC:    Chief Complaint  Patient presents with  . Atrial Fibrillation    VS Filed Vitals:   05/19/12 1308  BP: 119/67  Pulse: 71  Height: 5\' 9"  (1.753 m)  Weight: 211 lb (95.709 kg)    Weights Current Weight  05/19/12 211 lb (95.709 kg)  05/05/12 215 lb 13.3 oz (97.9 kg)  04/18/12 222 lb (100.699 kg)    Blood Pressure  BP Readings from Last 3 Encounters:  05/19/12 119/67  05/05/12 117/69  04/18/12 118/60     Admit date:  (Not on file) Last encounter with RMR:  04/18/2012   Allergy Tetanus toxoids  Current Outpatient Prescriptions  Medication Sig Dispense Refill  . allopurinol (ZYLOPRIM) 300 MG tablet Take 300 mg by mouth daily.      Marland Kitchen ascorbic acid (VITAMIN C) 500 MG tablet Take 500 mg by mouth every morning.       Marland Kitchen aspirin EC 81 MG tablet Take 81 mg by mouth daily.      . calcium-vitamin D (OSCAL WITH D) 500-200 MG-UNIT per tablet Take 1 tablet by mouth every morning.       . cholecalciferol (VITAMIN D) 1000 UNITS tablet Take 1,000 Units by mouth daily.      . COD LIVER OIL PO Take by mouth daily.      . dabigatran (PRADAXA) 150 MG CAPS Take 1 capsule (150 mg total) by mouth every 12 (twelve) hours.  60 capsule  6  . diltiazem (CARDIZEM CD) 180 MG 24 hr capsule Take 1 capsule (180 mg total) by mouth daily.  30 capsule  6  . enalapril (VASOTEC) 5 MG tablet Take 2.5 mg by mouth every morning.       . furosemide (LASIX) 20 MG tablet Take 20 mg by mouth daily.      Marland Kitchen glipiZIDE (GLUCOTROL XL) 2.5 MG 24 hr tablet Take 2.5 mg by mouth daily.      . metoprolol (LOPRESSOR) 50 MG tablet Take 50 mg by mouth 2 (two) times daily.        . Multiple Vitamin (MULITIVITAMIN WITH MINERALS) TABS Take 1 tablet by mouth every morning.       . simvastatin (ZOCOR) 20 MG tablet Take 20 mg by  mouth at bedtime.       . vitamin E 400 UNIT capsule Take 400 Units by mouth every morning.        No current facility-administered medications for this visit.    Discontinued Meds:   There are no discontinued medications.  Patient Active Problem List  Diagnosis  . Hypertension  . Gout  . Hyperlipidemia  . Deep vein thrombophlebitis of left leg  . Fasting hyperglycemia  . Cerebrovascular event  . Syncope  . Prostate cancer  . Atrial fibrillation  . Chest pain at rest  . Non-STEMI (non-ST elevated myocardial infarction)  . CAD (coronary artery disease)  . Acute gouty arthritis  . Adrenal insufficiency    LABS    Component Value Date/Time   NA 140 05/05/2012 0504   NA 140 05/04/2012 2006   NA 138 04/12/2012 2157   NA 147 11/20/2011 1036   K 3.4* 05/05/2012 0504   K  3.7 05/04/2012 2006   K 4.0 04/12/2012 2157   K 4.5 11/20/2011 1036   CL 108 05/05/2012 0504   CL 104 05/04/2012 2006   CL 102 04/12/2012 2157   CL 109 11/20/2011 1036   CO2 23 05/05/2012 0504   CO2 23 05/04/2012 2006   CO2 25 04/12/2012 2157   CO2 25 11/20/2011 1036   GLUCOSE 73 05/05/2012 0504   GLUCOSE 109* 05/04/2012 2006   GLUCOSE 105* 04/12/2012 2157   BUN 28* 05/05/2012 0504   BUN 31* 05/04/2012 2006   BUN 15 04/12/2012 2157   BUN 11 11/20/2011 1036   CREATININE 1.01 05/05/2012 0504   CREATININE 1.16 05/04/2012 2006   CREATININE 1.14 04/12/2012 2157   CREATININE 0.98 12/01/2011 1552   CREATININE 1.10 11/20/2011 1036   CALCIUM 9.6 05/05/2012 0504   CALCIUM 10.1 05/04/2012 2006   CALCIUM 9.4 04/12/2012 2157   CALCIUM 10.4 11/20/2011 1036   GFRNONAA 69* 05/05/2012 0504   GFRNONAA 58* 05/04/2012 2006   GFRNONAA 60* 04/12/2012 2157   GFRAA 80* 05/05/2012 0504   GFRAA 68* 05/04/2012 2006   GFRAA 69* 04/12/2012 2157   CMP     Component Value Date/Time   NA 140 05/05/2012 0504   NA 147 11/20/2011 1036   K 3.4* 05/05/2012 0504   K 4.5 11/20/2011 1036   CL 108 05/05/2012 0504   CL 109 11/20/2011 1036   CO2 23 05/05/2012 0504    CO2 25 11/20/2011 1036   GLUCOSE 73 05/05/2012 0504   BUN 28* 05/05/2012 0504   BUN 11 11/20/2011 1036   CREATININE 1.01 05/05/2012 0504   CREATININE 0.98 12/01/2011 1552   CALCIUM 9.6 05/05/2012 0504   CALCIUM 10.4 11/20/2011 1036   PROT 5.2* 05/05/2012 0504   PROT 6.2 11/20/2011 1036   ALBUMIN 2.9* 05/05/2012 0504   AST 17 05/05/2012 0504   AST 25 11/20/2011 1036   ALT 33 05/05/2012 0504   ALKPHOS 104 05/05/2012 0504   ALKPHOS 67 11/20/2011 1036   BILITOT 0.4 05/05/2012 0504   BILITOT 0.6 11/20/2011 1036   GFRNONAA 69* 05/05/2012 0504   GFRAA 80* 05/05/2012 0504       Component Value Date/Time   WBC 8.0 05/05/2012 0504   WBC 11.2* 05/04/2012 2006   WBC 5.6 04/12/2012 2157   HGB 10.4* 05/05/2012 0504   HGB 11.8* 05/04/2012 2006   HGB 11.6* 04/12/2012 2157   HCT 31.6* 05/05/2012 0504   HCT 36.1* 05/04/2012 2006   HCT 34.3* 04/12/2012 2157   HCT 43 11/20/2011 1039   MCV 105.3* 05/05/2012 0504   MCV 104.0* 05/04/2012 2006   MCV 102.4* 04/12/2012 2157    Lipid Panel     Component Value Date/Time   CHOL 175 08/12/2010 0544   TRIG 161* 08/12/2010 0544   HDL 32* 08/12/2010 0544   CHOLHDL 5.5 08/12/2010 0544   VLDL 32 08/12/2010 0544   LDLCALC 111* 08/12/2010 0544    ABG    Component Value Date/Time   TCO2 23 11/09/2011 1433     Lab Results  Component Value Date   TSH 1.507 05/05/2012   BNP (last 3 results)  Recent Labs  05/04/12 2006  PROBNP 353.7   Cardiac Panel (last 3 results) No results found for this basename: CKTOTAL, CKMB, TROPONINI, RELINDX,  in the last 72 hours  Iron/TIBC/Ferritin No results found for this basename: iron, tibc, ferritin     EKG Orders placed in visit on 05/19/12  . EKG 12-LEAD  Prior Assessment and Plan Problem List as of 05/19/2012     ICD-9-CM   Hypertension   Last Assessment & Plan   04/18/2012 Office Visit Written 04/18/2012  3:33 PM by Jodelle Gross, NP     Blood pressure is well-controlled, at 118/60. He remains on Cardizem, and enlapril  and metoprolol. Denies any dizziness, lightheadedness, or presyncope. I noticing some lower extremity edema probably related to the Cardizem as he does keep his legs in a dependent position. He is also on Fosamax and steroids. Weight is essentially the same as on discharge. Will not make any changes at this time and continue to monitor him closely. We will see him again in one month.    Gout   Last Assessment & Plan   04/18/2012 Office Visit Written 04/18/2012  3:35 PM by Jodelle Gross, NP     He is being followed by primary care and as having an appointment tomorrow concerning his underlying gout symptoms.    Hyperlipidemia   Last Assessment & Plan   03/15/2012 Office Visit Written 03/16/2012 12:23 PM by Kathlen Brunswick, MD     No recent assessment of lipids. Values in 2012 were somewhat suboptimal, but adequate in the absence of known coronary disease.  A repeat assessment would be useful and will be obtained.    Deep vein thrombophlebitis of left leg   Last Assessment & Plan   03/15/2012 Office Visit Written 03/16/2012 12:17 PM by Kathlen Brunswick, MD     DVT occurred postoperatively following total hip arthroplasty. Adequate course of anticoagulation has been completed. No further treatment required for this problem.    Fasting hyperglycemia   Last Assessment & Plan   03/15/2012 Office Visit Edited 03/17/2012  8:46 PM by Kathlen Brunswick, MD     FBGs obtained on an outpatient basis over the past few months range from 97-118. While measurement of a hemoglobin A1c level would be reassuring, it does not appear that pharmacologic therapy is warranted at present.    Cerebrovascular event   Syncope   Last Assessment & Plan   03/15/2012 Office Visit Written 03/16/2012 12:25 PM by Kathlen Brunswick, MD     Etiology of syncope uncertain; no further testing warranted at present.    Prostate cancer   Last Assessment & Plan   03/15/2012 Office Visit Written 03/16/2012 12:24 PM by Kathlen Brunswick, MD     Currently receiving course of radiation therapy    Atrial fibrillation   Last Assessment & Plan   04/18/2012 Office Visit Written 04/18/2012  3:37 PM by Jodelle Gross, NP     Remains in normal sinus rhythm with arrival of a branch block. Rate is well-controlled. No changes in his medications at this time. He will continue on Cardizem. I believe some of his lower extremity edema may be related to use of this calcium channel blocker. Reluctant to place him on any diuretics at this time.    Chest pain at rest   Non-STEMI (non-ST elevated myocardial infarction)   Last Assessment & Plan   04/18/2012 Office Visit Written 04/18/2012  3:36 PM by Jodelle Gross, NP     Cardiac catheterization completed during recent hospitalization on 04/12/2012 demonstrated nonobstructive CAD. Continue on ACE inhibitor, beta blocker, and aspirin. No further cardiac testing will be completed at this time. His ejection fraction was found to be normal at 60% per echocardiogram.    CAD (coronary artery disease)   Acute gouty  arthritis   Adrenal insufficiency       Imaging: Dg Chest Port 1 View  05/04/2012  *RADIOLOGY REPORT*  Clinical Data: Shortness of breath.  PORTABLE CHEST - 1 VIEW  Comparison: One-view chest 04/12/2012  Findings: The heart size is normal.  The lung volumes are low.  The no focal airspace disease is evident.  Mild pulmonary vascular congestion is evident.  IMPRESSION:  1.  Mild pulmonary vascular congestion. 2.  No focal airspace disease.   Original Report Authenticated By: Marin Roberts, M.D.

## 2012-05-19 NOTE — Patient Instructions (Addendum)
Your physician recommends that you schedule a follow-up appointment in: 6 MONTHS   Your physician recommends that you continue on your current medications as directed. Please refer to the Current Medication list given to you today.  

## 2012-05-19 NOTE — Assessment & Plan Note (Signed)
Blood pressure is well-controlled currently. Will not make any changes in his medication regimen. His weight is down 4 pounds since being seen last. He continues to take his Lasix daily, but has not today do to physician appointments. He does have some mild nonpitting edema in the lower extremities. We will make no changes at this time.

## 2012-05-19 NOTE — Progress Notes (Signed)
HPI [name] is a 79 year old patient of Dr. Dietrich Pates we are following for ongoing assessment and management of atrial fibrillation, with hospitalization in March following a non-ST elevation MI. Patient had nonobstructive CAD per cath. Patient was last seen in the office on 04/18/2012 and had ongoing complaints of gout-like symptoms in the setting of a hypertension medications being treated with steroids. He was recently seen in the emergency room on 05/05/2012 for recurrent chest pain. The patient was given nitroglycerin and had a large amount of flatus, with complete relief of pain. He has eaten some  "sour snap beans".He was discharged after an overnight observation, and continued on current medication regimen.   Allergies  Allergen Reactions  . Tetanus Toxoids Swelling    Also allergic to "high powered pain medications."    Current Outpatient Prescriptions  Medication Sig Dispense Refill  . allopurinol (ZYLOPRIM) 300 MG tablet Take 300 mg by mouth daily.      Marland Kitchen ascorbic acid (VITAMIN C) 500 MG tablet Take 500 mg by mouth every morning.       Marland Kitchen aspirin EC 81 MG tablet Take 81 mg by mouth daily.      . calcium-vitamin D (OSCAL WITH D) 500-200 MG-UNIT per tablet Take 1 tablet by mouth every morning.       . cholecalciferol (VITAMIN D) 1000 UNITS tablet Take 1,000 Units by mouth daily.      . COD LIVER OIL PO Take by mouth daily.      . dabigatran (PRADAXA) 150 MG CAPS Take 1 capsule (150 mg total) by mouth every 12 (twelve) hours.  60 capsule  6  . diltiazem (CARDIZEM CD) 180 MG 24 hr capsule Take 1 capsule (180 mg total) by mouth daily.  30 capsule  6  . enalapril (VASOTEC) 5 MG tablet Take 2.5 mg by mouth every morning.       . furosemide (LASIX) 20 MG tablet Take 20 mg by mouth daily.      Marland Kitchen glipiZIDE (GLUCOTROL XL) 2.5 MG 24 hr tablet Take 2.5 mg by mouth daily.      . metoprolol (LOPRESSOR) 50 MG tablet Take 50 mg by mouth 2 (two) times daily.        . Multiple Vitamin  (MULITIVITAMIN WITH MINERALS) TABS Take 1 tablet by mouth every morning.       . simvastatin (ZOCOR) 20 MG tablet Take 20 mg by mouth at bedtime.       . vitamin E 400 UNIT capsule Take 400 Units by mouth every morning.        No current facility-administered medications for this visit.    Past Medical History  Diagnosis Date  . Hypertension   . Gout     Acute episode involving ankle in 12/2011  . Hyperlipidemia   . Deep vein thrombophlebitis of left leg 11/2010    LLE; S/P THA  . Fasting hyperglycemia   . Stroke 08/2009; 07/2010    TIA in 07/2010  . H/O heat stroke 08/2009  . Prostate cancer 2013    adenocarcinoma; Gleason grade 8; PSA 9.45; EBRT 2013-14  . Renal cyst   . Syncope     01/2012: Negative pharmacologic stress nuclear  . PAF (paroxysmal atrial fibrillation)     a. first noted 01/2012 during admission for URI;  b. seen again 03/2012 after nstemi of unknown origin - pradaxa initiated.  Marland Kitchen CAD (coronary artery disease)     a. 01/2012 Elev Ti-->nonischemic MV;  b. 03/2012  NSTEMI/Cath: LM 50, LAD 50p/m, 68m/d, LCX nl, RI 60ost sup branch, RCA diff nonobs, EF 55%    Past Surgical History  Procedure Laterality Date  . Hip arthroplasty  12/08/2010    ARTHROPLASTY BIPOLAR HIP;  Surgeon-Olin; Left  . 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 I&d extremity  05/28/2011    Procedure: IRRIGATION AND DEBRIDEMENT EXTREMITY;  Surgeon: Sharma Covert, MD;  Location: [facility] OR;  Service: Orthopedics;  Laterality: Right;  I & D Right hand/wrist  . Colonoscopy   07/18/2003    RMR: Rectum internal hemorrhoids.  Otherwise, normal rectum/ Left-sided diverticula.  Remainder of colonic mucosa-nl    NWG:NFAOZH of systems complete and found to be negative unless listed above  PHYSICAL EXAM BP 119/67  Pulse 71  Ht 5\' 9"  (1.753 m)  Wt 211 lb (95.709 kg)  BMI 31.15 kg/m2 General: Well developed, well nourished, in no acute distress Head: Eyes PERRLA, No xanthomas.    Normal cephalic and atramatic  Lungs: Clear bilaterally to auscultation and percussion. Heart: HRRR S1 S2, without MRG.  Pulses are 2+ & equal.            No carotid bruit. No JVD.  No abdominal bruits. No femoral bruits. Abdomen: Bowel sounds are positive, abdomen soft and non-tender without masses or                  Hernia's noted. Msk:  Back normal, normal gait. Normal strength and tone for age. Extremities: No clubbing, cyanosis 1+edema, L>R.  DP +1 Neuro: Alert and oriented X 3. Psych:  Good affect, responds appropriately  EKG:NSR with RBBB  ASSESSMENT AND PLAN

## 2012-06-02 ENCOUNTER — Ambulatory Visit (INDEPENDENT_AMBULATORY_CARE_PROVIDER_SITE_OTHER): Payer: Medicare Other | Admitting: Otolaryngology

## 2012-06-07 ENCOUNTER — Ambulatory Visit (INDEPENDENT_AMBULATORY_CARE_PROVIDER_SITE_OTHER): Payer: Medicare Other | Admitting: Urology

## 2012-06-07 DIAGNOSIS — C61 Malignant neoplasm of prostate: Secondary | ICD-10-CM

## 2012-06-11 ENCOUNTER — Emergency Department (HOSPITAL_COMMUNITY): Payer: Medicare Other

## 2012-06-11 ENCOUNTER — Encounter (HOSPITAL_COMMUNITY): Payer: Self-pay | Admitting: *Deleted

## 2012-06-11 ENCOUNTER — Emergency Department (HOSPITAL_COMMUNITY)
Admission: EM | Admit: 2012-06-11 | Discharge: 2012-06-11 | Disposition: A | Discharge status: Home / Self Care | Payer: Medicare Other | Attending: Emergency Medicine | Admitting: Emergency Medicine

## 2012-06-11 DIAGNOSIS — I4891 Unspecified atrial fibrillation: Secondary | ICD-10-CM | POA: Insufficient documentation

## 2012-06-11 DIAGNOSIS — M25579 Pain in unspecified ankle and joints of unspecified foot: Secondary | ICD-10-CM | POA: Insufficient documentation

## 2012-06-11 DIAGNOSIS — Z89629 Acquired absence of unspecified hip joint: Secondary | ICD-10-CM | POA: Insufficient documentation

## 2012-06-11 DIAGNOSIS — M109 Gout, unspecified: Secondary | ICD-10-CM | POA: Insufficient documentation

## 2012-06-11 DIAGNOSIS — Z87891 Personal history of nicotine dependence: Secondary | ICD-10-CM | POA: Insufficient documentation

## 2012-06-11 DIAGNOSIS — Z86718 Personal history of other venous thrombosis and embolism: Secondary | ICD-10-CM | POA: Insufficient documentation

## 2012-06-11 DIAGNOSIS — M25572 Pain in left ankle and joints of left foot: Secondary | ICD-10-CM

## 2012-06-11 DIAGNOSIS — I10 Essential (primary) hypertension: Secondary | ICD-10-CM | POA: Insufficient documentation

## 2012-06-11 DIAGNOSIS — R4182 Altered mental status, unspecified: Secondary | ICD-10-CM | POA: Insufficient documentation

## 2012-06-11 DIAGNOSIS — Z7982 Long term (current) use of aspirin: Secondary | ICD-10-CM | POA: Insufficient documentation

## 2012-06-11 DIAGNOSIS — I2510 Atherosclerotic heart disease of native coronary artery without angina pectoris: Secondary | ICD-10-CM | POA: Insufficient documentation

## 2012-06-11 DIAGNOSIS — Z79899 Other long term (current) drug therapy: Secondary | ICD-10-CM | POA: Insufficient documentation

## 2012-06-11 DIAGNOSIS — Z87448 Personal history of other diseases of urinary system: Secondary | ICD-10-CM | POA: Insufficient documentation

## 2012-06-11 DIAGNOSIS — E785 Hyperlipidemia, unspecified: Secondary | ICD-10-CM | POA: Insufficient documentation

## 2012-06-11 DIAGNOSIS — R509 Fever, unspecified: Secondary | ICD-10-CM

## 2012-06-11 DIAGNOSIS — Z8673 Personal history of transient ischemic attack (TIA), and cerebral infarction without residual deficits: Secondary | ICD-10-CM | POA: Insufficient documentation

## 2012-06-11 DIAGNOSIS — Z8546 Personal history of malignant neoplasm of prostate: Secondary | ICD-10-CM | POA: Insufficient documentation

## 2012-06-11 LAB — CBC WITH DIFFERENTIAL/PLATELET
Eosinophils Absolute: 0.1 10*3/uL (ref 0.0–0.7)
Eosinophils Relative: 1 % (ref 0–5)
Lymphs Abs: 0.3 10*3/uL — ABNORMAL LOW (ref 0.7–4.0)
MCH: 34.2 pg — ABNORMAL HIGH (ref 26.0–34.0)
MCHC: 33.3 g/dL (ref 30.0–36.0)
MCV: 102.6 fL — ABNORMAL HIGH (ref 78.0–100.0)
Monocytes Relative: 7 % (ref 3–12)
Platelets: 134 10*3/uL — ABNORMAL LOW (ref 150–400)
RBC: 3.83 MIL/uL — ABNORMAL LOW (ref 4.22–5.81)

## 2012-06-11 LAB — URINALYSIS, ROUTINE W REFLEX MICROSCOPIC
Glucose, UA: NEGATIVE mg/dL
Hgb urine dipstick: NEGATIVE
Ketones, ur: NEGATIVE mg/dL
Protein, ur: NEGATIVE mg/dL

## 2012-06-11 LAB — BASIC METABOLIC PANEL
BUN: 19 mg/dL (ref 6–23)
Calcium: 9.3 mg/dL (ref 8.4–10.5)
GFR calc non Af Amer: 76 mL/min — ABNORMAL LOW (ref >90)
Glucose, Bld: 115 mg/dL — ABNORMAL HIGH (ref 70–99)
Sodium: 139 mEq/L (ref 135–145)

## 2012-06-11 MED ORDER — ACETAMINOPHEN 650 MG RE SUPP
650.0000 mg | Freq: Once | RECTAL | Status: AC
  Administered 2012-06-11: 650 mg via RECTAL
  Filled 2012-06-11: qty 1

## 2012-06-11 MED ORDER — PREDNISONE 50 MG PO TABS
60.0000 mg | ORAL_TABLET | Freq: Once | ORAL | Status: AC
  Administered 2012-06-11: 60 mg via ORAL
  Filled 2012-06-11: qty 1

## 2012-06-11 MED ORDER — FENTANYL CITRATE 0.05 MG/ML IJ SOLN
50.0000 ug | Freq: Once | INTRAMUSCULAR | Status: AC
  Administered 2012-06-11: 50 ug via INTRAVENOUS
  Filled 2012-06-11: qty 2

## 2012-06-11 MED ORDER — OXYCODONE-ACETAMINOPHEN 5-325 MG PO TABS: 1.0000 | ORAL_TABLET | ORAL | Status: DC | PRN

## 2012-06-11 MED ORDER — OXYCODONE-ACETAMINOPHEN 5-325 MG PO TABS
1.0000 | ORAL_TABLET | Freq: Once | ORAL | Status: AC
  Administered 2012-06-11: 1 via ORAL
  Filled 2012-06-11: qty 1

## 2012-06-11 NOTE — ED Provider Notes (Signed)
History  This chart was scribed for Joya [name], MD by Shari Heritage, ED Scribe. The patient was seen in room APA05/APA05. [name] care was started at 1044.   CSN: [id number]  Arrival date & time 06/11/12  1033   First MD Initiated Contact with Patient 06/11/12 1041      Chief Complaint  Patient presents with  . Altered Mental Status  . Hip Pain    The history is provided by the patient. No language interpreter was used.    HPI Comments: [name] is a 79 y.o. male with history of HTN, coronary artery disease, left DVT, left hip arthroplasty who presents to the Emergency Department via EMS complaining of severe, constant, left hip, left foot and left ankle pain onset earlier today. Family states that patient has also had increased confusion since 8 pm last night. Patient denies any falls or injuries to lower extremities. Patient denies chest pain, shortness of breath, abdominal pain or back pain. [name] other medical history includes hyperlipidemia, stroke, atrial fibrillation and gout.    Past Medical History  Diagnosis Date  . Hypertension   . Gout     Acute episode involving ankle in 12/2011  . Hyperlipidemia   . Deep vein thrombophlebitis of left leg 11/2010    LLE; S/P THA  . Fasting hyperglycemia   . Stroke 08/2009; 07/2010    TIA in 07/2010  . H/O heat stroke 08/2009  . Prostate cancer 2013    adenocarcinoma; Gleason grade 8; PSA 9.45; EBRT 2013-14  . Renal cyst   . Syncope     01/2012: Negative pharmacologic stress nuclear  . PAF (paroxysmal atrial fibrillation)     a. first noted 01/2012 during admission for URI;  b. seen again 03/2012 after nstemi of unknown origin - pradaxa initiated.  Marland Kitchen CAD (coronary artery disease)     a. 01/2012 Elev Ti-->nonischemic MV;  b. 03/2012 NSTEMI/Cath: LM 50, LAD 50p/m, 62m/d, LCX nl, RI 60ost sup branch, RCA diff nonobs, EF 55%    Past Surgical History  Procedure Laterality Date  . Hip arthroplasty  12/08/2010     ARTHROPLASTY BIPOLAR HIP;  Surgeon-Olin; Left  . 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 I&d extremity  05/28/2011    Procedure: IRRIGATION AND DEBRIDEMENT EXTREMITY;  Surgeon: Sharma Covert, MD;  Location: [facility] OR;  Service: Orthopedics;  Laterality: Right;  I & D Right hand/wrist  . Colonoscopy   07/18/2003    RMR: Rectum internal hemorrhoids.  Otherwise, normal rectum/ Left-sided diverticula.  Remainder of colonic mucosa-nl    Family History  Problem Relation Age of Onset  . Prostate cancer Brother   . Colon cancer Sister 29  . Diabetes Mellitus II Father   . Stroke Father     History  Substance Use Topics  . Smoking status: Former Smoker    Types: Cigarettes, Cigars    Quit date: 01/27/1948  . Smokeless tobacco: Current User    Types: Chew     Comment: "quit smoking cigarettes age 68 or 11"  . Alcohol Use: No      Review of Systems A complete 10 system review of systems was obtained and all systems are negative except as noted in the HPI and PMH.   Allergies  Tetanus toxoids  Home Medications   Current Outpatient Rx  Name  Route  Sig  Dispense  Refill  . allopurinol (ZYLOPRIM) 300 MG  tablet   Oral   Take 300 mg by mouth daily.         Marland Kitchen ascorbic acid (VITAMIN C) 500 MG tablet   Oral   Take 500 mg by mouth every morning.          Marland Kitchen aspirin EC 81 MG tablet   Oral   Take 81 mg by mouth daily.         . calcium-vitamin D (OSCAL WITH D) 500-200 MG-UNIT per tablet   Oral   Take 1 tablet by mouth every morning.          . cholecalciferol (VITAMIN D) 1000 UNITS tablet   Oral   Take 1,000 Units by mouth daily.         . COD LIVER OIL PO   Oral   Take by mouth daily.         . dabigatran (PRADAXA) 150 MG CAPS   Oral   Take 1 capsule (150 mg total) by mouth every 12 (twelve) hours.   60 capsule   6   . diltiazem (CARDIZEM CD) 180 MG 24 hr capsule   Oral   Take 1 capsule (180 mg total) by mouth daily.   30  capsule   6   . enalapril (VASOTEC) 5 MG tablet   Oral   Take 2.5 mg by mouth every morning.          . furosemide (LASIX) 20 MG tablet   Oral   Take 20 mg by mouth daily.         Marland Kitchen glipiZIDE (GLUCOTROL XL) 2.5 MG 24 hr tablet   Oral   Take 2.5 mg by mouth daily.         . metoprolol (LOPRESSOR) 50 MG tablet   Oral   Take 50 mg by mouth 2 (two) times daily.           . Multiple Vitamin (MULITIVITAMIN WITH MINERALS) TABS   Oral   Take 1 tablet by mouth every morning.          . simvastatin (ZOCOR) 20 MG tablet   Oral   Take 20 mg by mouth at bedtime.          . vitamin E 400 UNIT capsule   Oral   Take 400 Units by mouth every morning.            Triage Vitals: BP 105/65  Pulse 84  Temp(Src) 99.4 F (37.4 C) (Oral)  Resp 20  SpO2 94%  Physical Exam CONSTITUTIONAL: Well developed/well nourished, agitated HEAD: Normocephalic/atraumatic EYES: EOMI/PERRL ENMT: Mucous membranes dry NECK: supple no meningeal signs SPINE:entire spine nontender CV: S1/S2 noted, no murmurs/rubs/gallops noted LUNGS: Lungs are clear to auscultation bilaterally, no apparent distress ABDOMEN: soft, nontender, no rebound or guarding GU:no cva tenderness NEURO: Pt is awake/alert, moves all extremitiesx4 EXTREMITIES: pulses normal, full ROM, reported tenderness to left ankle, left foot and left hip but no signs of trauma or erythema, no deformity SKIN: warm, color normal PSYCH:  agitated  ED Course  Procedures  DIAGNOSTIC STUDIES: Oxygen Saturation is 94% on room air, adequate by my interpretation.    COORDINATION OF CARE: 10:44 AM- Patient informed of current plan for treatment and evaluation and agrees with plan at this time.   Pt here with left LE pain.  No signs of trauma and pulses intact.  Family at bedside, however both patient/family are poor historians.  Pt currently stable.  Will follow closely   Pt  observed for several hours in the ED He is at his  baseline His pain is improved He denies cp/sob/abdominal pain.  No focal weakness is noted.  He has no erythema to suggest cellulitis or acute DVT to his lower extremities.  He is ambulating and wants to leave.  He did have fever while in the ED, but no signs of serious bacterial infection/meningitis.  Apparently he had similar presentation and was admitted earlier this year.  They report it is due to gout.  He requested one dose of prednisone as he has not taken his steroids today   MDM  Nursing notes including past medical history and social history reviewed and considered in documentation xrays reviewed and considered Labs/vital reviewed and considered     Date: 06/11/2012  Rate: 81  Rhythm: normal sinus rhythm  QRS Axis: left  Intervals: normal  ST/T Wave abnormalities: nonspecific ST changes  Conduction Disutrbances:right bundle branch block  Narrative Interpretation:   Old EKG Reviewed: unchanged     I personally performed the services described in this documentation, which was scribed in my presence. The recorded information has been reviewed and is accurate.      Joya [name], MD 06/11/12 [phone]

## 2012-06-11 NOTE — ED Notes (Addendum)
Pt brought to er by [facility] with c/o altered mental status, left hip pain. Bilateral feet pain. Family reported to ems that they noticed that pt seemed confused last night around 8pm, thought it was related to pain medication that he had been taking. Pt able to answer questions when asked with correct answers,does ramble in conversation at times. Denies any injury to hip or feet. CBG 129 with EMS

## 2012-06-11 NOTE — ED Notes (Signed)
The patient states that he is having a lot of pain in his left ankle.  The family member states that he "acts funny" every time that the patient is in a lot of pain and his gout is flared up.  The brother states "he just needs a dose of prednisone and we can go home."  The patient mumbles from time to time and is very difficult to obtain a history from.  The family states that the patient has around the clock care at home but does not illicit a history as to why he has home care.  The patient does state that he broke his left hip and had surgery in the past.  According to his past medical history, the patient has a history of Atrial fibrillation and left DVT as well.

## 2012-06-11 NOTE — ED Notes (Signed)
Pt ambulated in hallway with walker, steady gait. Pt states has pain in hip but no like before.  EDP notified of steady gait while ambulated.

## 2012-06-11 NOTE — ED Notes (Signed)
The patient appears to lean to the left when sitting in the bed, is able to follow commands.  The family states that his home care nurse sent him to the ER for evaluation of increased confusion and pain.

## 2012-10-11 ENCOUNTER — Ambulatory Visit (INDEPENDENT_AMBULATORY_CARE_PROVIDER_SITE_OTHER): Payer: Medicare Other | Admitting: Urology

## 2012-10-11 DIAGNOSIS — C61 Malignant neoplasm of prostate: Secondary | ICD-10-CM

## 2012-10-26 ENCOUNTER — Other Ambulatory Visit: Payer: Self-pay | Admitting: *Deleted

## 2012-11-20 ENCOUNTER — Emergency Department (HOSPITAL_COMMUNITY): Payer: Medicare Other

## 2012-11-20 ENCOUNTER — Inpatient Hospital Stay (HOSPITAL_COMMUNITY)
Admission: EM | Admit: 2012-11-20 | Discharge: 2012-11-22 | DRG: 072 | Disposition: A | Discharge status: Home Health | Payer: Medicare Other | Attending: Internal Medicine | Admitting: Internal Medicine

## 2012-11-20 ENCOUNTER — Encounter (HOSPITAL_COMMUNITY): Payer: Self-pay | Admitting: Emergency Medicine

## 2012-11-20 DIAGNOSIS — Z9181 History of falling: Secondary | ICD-10-CM

## 2012-11-20 DIAGNOSIS — Z23 Encounter for immunization: Secondary | ICD-10-CM

## 2012-11-20 DIAGNOSIS — Z947 Corneal transplant status: Secondary | ICD-10-CM

## 2012-11-20 DIAGNOSIS — E785 Hyperlipidemia, unspecified: Secondary | ICD-10-CM | POA: Diagnosis present

## 2012-11-20 DIAGNOSIS — Z8672 Personal history of thrombophlebitis: Secondary | ICD-10-CM

## 2012-11-20 DIAGNOSIS — M109 Gout, unspecified: Secondary | ICD-10-CM

## 2012-11-20 DIAGNOSIS — Z79899 Other long term (current) drug therapy: Secondary | ICD-10-CM

## 2012-11-20 DIAGNOSIS — G9340 Encephalopathy, unspecified: Principal | ICD-10-CM

## 2012-11-20 DIAGNOSIS — Z823 Family history of stroke: Secondary | ICD-10-CM

## 2012-11-20 DIAGNOSIS — E669 Obesity, unspecified: Secondary | ICD-10-CM | POA: Diagnosis present

## 2012-11-20 DIAGNOSIS — T4275XA Adverse effect of unspecified antiepileptic and sedative-hypnotic drugs, initial encounter: Secondary | ICD-10-CM | POA: Diagnosis present

## 2012-11-20 DIAGNOSIS — M810 Age-related osteoporosis without current pathological fracture: Secondary | ICD-10-CM | POA: Diagnosis present

## 2012-11-20 DIAGNOSIS — D696 Thrombocytopenia, unspecified: Secondary | ICD-10-CM | POA: Diagnosis present

## 2012-11-20 DIAGNOSIS — Z8042 Family history of malignant neoplasm of prostate: Secondary | ICD-10-CM

## 2012-11-20 DIAGNOSIS — I4891 Unspecified atrial fibrillation: Secondary | ICD-10-CM

## 2012-11-20 DIAGNOSIS — R29898 Other symptoms and signs involving the musculoskeletal system: Secondary | ICD-10-CM

## 2012-11-20 DIAGNOSIS — I2510 Atherosclerotic heart disease of native coronary artery without angina pectoris: Secondary | ICD-10-CM | POA: Diagnosis present

## 2012-11-20 DIAGNOSIS — Z8546 Personal history of malignant neoplasm of prostate: Secondary | ICD-10-CM

## 2012-11-20 DIAGNOSIS — IMO0002 Reserved for concepts with insufficient information to code with codable children: Secondary | ICD-10-CM

## 2012-11-20 DIAGNOSIS — Z7901 Long term (current) use of anticoagulants: Secondary | ICD-10-CM

## 2012-11-20 DIAGNOSIS — I10 Essential (primary) hypertension: Secondary | ICD-10-CM | POA: Diagnosis present

## 2012-11-20 DIAGNOSIS — Z8673 Personal history of transient ischemic attack (TIA), and cerebral infarction without residual deficits: Secondary | ICD-10-CM

## 2012-11-20 DIAGNOSIS — Z96649 Presence of unspecified artificial hip joint: Secondary | ICD-10-CM

## 2012-11-20 DIAGNOSIS — M1990 Unspecified osteoarthritis, unspecified site: Secondary | ICD-10-CM | POA: Diagnosis present

## 2012-11-20 DIAGNOSIS — R531 Weakness: Secondary | ICD-10-CM | POA: Diagnosis present

## 2012-11-20 DIAGNOSIS — Z87891 Personal history of nicotine dependence: Secondary | ICD-10-CM

## 2012-11-20 DIAGNOSIS — R269 Unspecified abnormalities of gait and mobility: Secondary | ICD-10-CM | POA: Diagnosis present

## 2012-11-20 DIAGNOSIS — Z833 Family history of diabetes mellitus: Secondary | ICD-10-CM

## 2012-11-20 DIAGNOSIS — R2681 Unsteadiness on feet: Secondary | ICD-10-CM | POA: Diagnosis present

## 2012-11-20 DIAGNOSIS — T48205A Adverse effect of unspecified drugs acting on muscles, initial encounter: Secondary | ICD-10-CM | POA: Diagnosis present

## 2012-11-20 DIAGNOSIS — I252 Old myocardial infarction: Secondary | ICD-10-CM

## 2012-11-20 DIAGNOSIS — Z800 Family history of malignant neoplasm of digestive organs: Secondary | ICD-10-CM

## 2012-11-20 LAB — URINALYSIS, ROUTINE W REFLEX MICROSCOPIC
Bilirubin Urine: NEGATIVE
Ketones, ur: NEGATIVE mg/dL
Nitrite: NEGATIVE
Urobilinogen, UA: 0.2 mg/dL (ref 0.0–1.0)
pH: 6.5 (ref 5.0–8.0)

## 2012-11-20 LAB — COMPREHENSIVE METABOLIC PANEL
ALT: 29 U/L (ref 0–53)
AST: 20 U/L (ref 0–37)
Albumin: 2.7 g/dL — ABNORMAL LOW (ref 3.5–5.2)
CO2: 25 mEq/L (ref 19–32)
Calcium: 10 mg/dL (ref 8.4–10.5)
Chloride: 107 mEq/L (ref 96–112)
Creatinine, Ser: 0.78 mg/dL (ref 0.50–1.35)
GFR calc non Af Amer: 84 mL/min — ABNORMAL LOW (ref >90)
Glucose, Bld: 92 mg/dL (ref 70–99)
Sodium: 142 mEq/L (ref 135–145)
Total Bilirubin: 0.4 mg/dL (ref 0.3–1.2)

## 2012-11-20 LAB — CBC
Hemoglobin: 13.9 g/dL (ref 13.0–17.0)
MCH: 34.2 pg — ABNORMAL HIGH (ref 26.0–34.0)
MCV: 102.7 fL — ABNORMAL HIGH (ref 78.0–100.0)
RBC: 4.07 MIL/uL — ABNORMAL LOW (ref 4.22–5.81)
WBC: 6.2 10*3/uL (ref 4.0–10.5)

## 2012-11-20 LAB — BLOOD GAS, ARTERIAL
Acid-Base Excess: 2 mmol/L (ref 0.0–2.0)
TCO2: 22.3 mmol/L (ref 0–100)
pCO2 arterial: 35.9 mmHg (ref 35.0–45.0)
pH, Arterial: 7.465 — ABNORMAL HIGH (ref 7.350–7.450)
pO2, Arterial: 54.9 mmHg — ABNORMAL LOW (ref 80.0–100.0)

## 2012-11-20 LAB — DIFFERENTIAL
Basophils Absolute: 0 10*3/uL (ref 0.0–0.1)
Basophils Relative: 0 % (ref 0–1)
Eosinophils Relative: 2 % (ref 0–5)
Lymphocytes Relative: 10 % — ABNORMAL LOW (ref 12–46)
Monocytes Absolute: 0.5 10*3/uL (ref 0.1–1.0)
Neutrophils Relative %: 81 % — ABNORMAL HIGH (ref 43–77)

## 2012-11-20 LAB — RAPID URINE DRUG SCREEN, HOSP PERFORMED
Amphetamines: NOT DETECTED
Barbiturates: NOT DETECTED
Benzodiazepines: NOT DETECTED
Cocaine: NOT DETECTED
Opiates: NOT DETECTED
Tetrahydrocannabinol: NOT DETECTED

## 2012-11-20 LAB — TROPONIN I: Troponin I: <0.3 ng/mL (ref <0.30)

## 2012-11-20 LAB — GLUCOSE, CAPILLARY
Glucose-Capillary: 82 mg/dL (ref 70–99)
Glucose-Capillary: 88 mg/dL (ref 70–99)

## 2012-11-20 LAB — TSH: TSH: 1.347 u[IU]/mL (ref 0.350–4.500)

## 2012-11-20 LAB — PROTIME-INR: INR: 1.26 (ref 0.00–1.49)

## 2012-11-20 MED ORDER — DILTIAZEM HCL ER COATED BEADS 180 MG PO CP24
180.0000 mg | ORAL_CAPSULE | Freq: Every day | ORAL | Status: DC
  Administered 2012-11-20 – 2012-11-22 (×3): 180 mg via ORAL
  Filled 2012-11-20 (×3): qty 1

## 2012-11-20 MED ORDER — SODIUM CHLORIDE 0.9 % IJ SOLN
3.0000 mL | Freq: Two times a day (BID) | INTRAMUSCULAR | Status: DC
  Administered 2012-11-21: 3 mL via INTRAVENOUS

## 2012-11-20 MED ORDER — INFLUENZA VAC SPLIT QUAD 0.5 ML IM SUSP
0.5000 mL | INTRAMUSCULAR | Status: AC
  Administered 2012-11-21: 0.5 mL via INTRAMUSCULAR
  Filled 2012-11-20: qty 0.5

## 2012-11-20 MED ORDER — SODIUM CHLORIDE 0.9 % IV SOLN: 250.0000 mL | INTRAVENOUS | Status: DC | PRN

## 2012-11-20 MED ORDER — ALLOPURINOL 300 MG PO TABS
300.0000 mg | ORAL_TABLET | Freq: Every day | ORAL | Status: DC
  Administered 2012-11-20 – 2012-11-22 (×3): 300 mg via ORAL
  Filled 2012-11-20 (×3): qty 1

## 2012-11-20 MED ORDER — SODIUM CHLORIDE 0.9 % IJ SOLN
3.0000 mL | Freq: Two times a day (BID) | INTRAMUSCULAR | Status: DC
  Administered 2012-11-20 – 2012-11-21 (×4): 3 mL via INTRAVENOUS

## 2012-11-20 MED ORDER — ASPIRIN EC 81 MG PO TBEC
81.0000 mg | DELAYED_RELEASE_TABLET | Freq: Every day | ORAL | Status: DC
  Administered 2012-11-20 – 2012-11-22 (×3): 81 mg via ORAL
  Filled 2012-11-20 (×3): qty 1

## 2012-11-20 MED ORDER — METOPROLOL TARTRATE 50 MG PO TABS
50.0000 mg | ORAL_TABLET | Freq: Two times a day (BID) | ORAL | Status: DC
  Administered 2012-11-20 – 2012-11-22 (×6): 50 mg via ORAL
  Filled 2012-11-20 (×6): qty 1

## 2012-11-20 MED ORDER — ONDANSETRON HCL 4 MG PO TABS: 4.0000 mg | ORAL_TABLET | Freq: Four times a day (QID) | ORAL | Status: DC | PRN

## 2012-11-20 MED ORDER — INSULIN ASPART 100 UNIT/ML ~~LOC~~ SOLN: 0.0000 [IU] | Freq: Every day | SUBCUTANEOUS | Status: DC

## 2012-11-20 MED ORDER — SODIUM CHLORIDE 0.9 % IJ SOLN: 3.0000 mL | INTRAMUSCULAR | Status: DC | PRN

## 2012-11-20 MED ORDER — FUROSEMIDE 10 MG/ML IJ SOLN
20.0000 mg | Freq: Two times a day (BID) | INTRAMUSCULAR | Status: DC
  Administered 2012-11-20 – 2012-11-22 (×4): 20 mg via INTRAVENOUS
  Filled 2012-11-20 (×5): qty 2

## 2012-11-20 MED ORDER — ACETAMINOPHEN 325 MG PO TABS
650.0000 mg | ORAL_TABLET | Freq: Four times a day (QID) | ORAL | Status: DC | PRN
  Administered 2012-11-20: 650 mg via ORAL
  Filled 2012-11-20: qty 2

## 2012-11-20 MED ORDER — ACETAMINOPHEN 650 MG RE SUPP: 650.0000 mg | Freq: Four times a day (QID) | RECTAL | Status: DC | PRN

## 2012-11-20 MED ORDER — ONDANSETRON HCL 4 MG/2ML IJ SOLN: 4.0000 mg | Freq: Four times a day (QID) | INTRAMUSCULAR | Status: DC | PRN

## 2012-11-20 MED ORDER — INSULIN ASPART 100 UNIT/ML ~~LOC~~ SOLN
0.0000 [IU] | Freq: Three times a day (TID) | SUBCUTANEOUS | Status: DC
  Administered 2012-11-22 (×3): 2 [IU] via SUBCUTANEOUS

## 2012-11-20 MED ORDER — ATORVASTATIN CALCIUM 10 MG PO TABS
10.0000 mg | ORAL_TABLET | Freq: Every day | ORAL | Status: DC
  Administered 2012-11-20 – 2012-11-22 (×3): 10 mg via ORAL
  Filled 2012-11-20 (×3): qty 1

## 2012-11-20 NOTE — ED Notes (Signed)
Report called to [place], RN on [address].

## 2012-11-20 NOTE — ED Notes (Signed)
Family called EMS  Patient not acting right.  Arrived fully dressed with urine saturated depends in place.  Multiple bruises on arm, abrasion of right elbow - family reports a recent fall.

## 2012-11-20 NOTE — ED Provider Notes (Signed)
CSN: [id number]     Arrival date & time 11/20/12  4098 History   This chart was scribed for Scott Horn, MD by Blanchard Kelch, ED Scribe. The patient was seen in room APA18/APA18. [name] care was started at 7:27 AM.      Chief Complaint  Patient presents with  . Weakness    Patient is a 79 y.o. male presenting with weakness. The history is provided by the patient. No language interpreter was used.  Weakness   HPI Comments: [name] is a 79 y.o. male with a history of CAD, a fib, hypertension, hyperlipidemia and diabetes brought in by ambulance, who presents to the Emergency Department complaining of lower extremity weakness. 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 At baseline he walks with a walker. He fell about a week ago and has partial amnesia from the event. He remembers waking up on the bathroom floor. He is weaker than usual since the fall and has a worsened unsteady gait. He states that his legs feel uncoordinated. He also complains of chronic pain in the low back, both hips and both feet. There are no other abnormal focal symptoms. He is more drowsy than usual today but is not confused, he is no headache neck pain chest pain shortness breath abdominal pain vomiting diarrhea or other concerns. There's been no fever. He has no headache or neck pain. There is no treatment prior to arrival.   Past Medical History  Diagnosis Date  . Hypertension   . Gout     Acute episode involving ankle in 12/2011  . Hyperlipidemia   . Deep vein thrombophlebitis of left leg 11/2010    LLE; S/P THA  . Fasting hyperglycemia   . Stroke 08/2009; 07/2010    TIA in 07/2010  . H/O heat stroke 08/2009  . Prostate cancer 2013    adenocarcinoma; Gleason grade 8; PSA 9.45; EBRT 2013-14  . Renal cyst   . Syncope     01/2012: Negative pharmacologic stress nuclear  . PAF (paroxysmal atrial fibrillation)     a. first noted 01/2012 during admission for URI;  b. seen again 03/2012 after nstemi of  unknown origin - pradaxa initiated.  Marland Kitchen CAD (coronary artery disease)     a. 01/2012 Elev Ti-->nonischemic MV;  b. 03/2012 NSTEMI/Cath: LM 50, LAD 50p/m, 24m/d, LCX nl, RI 60ost sup branch, RCA diff nonobs, EF 55%   Past Surgical History  Procedure Laterality Date  . Hip arthroplasty  12/08/2010    ARTHROPLASTY BIPOLAR HIP;  Surgeon-Olin; Left  . 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 I&d extremity  05/28/2011    Procedure: IRRIGATION AND DEBRIDEMENT EXTREMITY;  Surgeon: Sharma Covert, MD;  Location: [facility] OR;  Service: Orthopedics;  Laterality: Right;  I & D Right hand/wrist  . Colonoscopy   07/18/2003    RMR: Rectum internal hemorrhoids.  Otherwise, normal rectum/ Left-sided diverticula.  Remainder of colonic mucosa-nl   Family History  Problem Relation Age of Onset  . Prostate cancer Brother   . Colon cancer Sister 22  . Diabetes Mellitus II Father   . Stroke Father    History  Substance Use Topics  . Smoking status: Former Smoker    Types: Cigarettes, Cigars    Quit date: 01/27/1948  . Smokeless tobacco: Current User    Types: Chew     Comment: "quit smoking cigarettes age 67 or 89"  .  Alcohol Use: No    Review of Systems  Neurological: Positive for weakness.  10 Systems reviewed and are negative for acute change except as noted in the HPI.  Allergies  Tetanus toxoids  Home Medications   Current Outpatient Rx  Name  Route  Sig  Dispense  Refill  . allopurinol (ZYLOPRIM) 300 MG tablet   Oral   Take 300 mg by mouth daily.         Marland Kitchen ascorbic acid (VITAMIN C) 500 MG tablet   Oral   Take 1,000 mg by mouth every morning.          Marland Kitchen aspirin EC 81 MG tablet   Oral   Take 81 mg by mouth daily.         . calcium-vitamin D (OSCAL WITH D) 500-200 MG-UNIT per tablet   Oral   Take 1 tablet by mouth every morning.          . cholecalciferol (VITAMIN D) 1000 UNITS tablet   Oral   Take 1,000 Units by mouth daily.         . COD  LIVER OIL PO   Oral   Take 2 tablets by mouth 2 (two) times daily.          Marland Kitchen diltiazem (CARDIZEM CD) 180 MG 24 hr capsule   Oral   Take 180 mg by mouth daily.         . enalapril (VASOTEC) 5 MG tablet   Oral   Take 2.5 mg by mouth daily.          . metoprolol (LOPRESSOR) 50 MG tablet   Oral   Take 50 mg by mouth 2 (two) times daily.           . Multiple Vitamin (MULITIVITAMIN WITH MINERALS) TABS   Oral   Take 1 tablet by mouth every morning.          . simvastatin (ZOCOR) 20 MG tablet   Oral   Take 20 mg by mouth at bedtime.          . dabigatran (PRADAXA) 150 MG CAPS capsule   Oral   Take 150 mg by mouth daily.         . furosemide (LASIX) 20 MG tablet   Oral   Take 20 mg by mouth daily.         Marland Kitchen glipiZIDE (GLUCOTROL XL) 2.5 MG 24 hr tablet   Oral   Take 2.5 mg by mouth daily.          . nitroGLYCERIN (NITROSTAT) 0.4 MG SL tablet   Sublingual   Place 0.4 mg under the tongue every 5 (five) minutes as needed for chest pain.         . predniSONE (DELTASONE) 10 MG tablet   Oral   Take 10 mg by mouth daily as needed (severe gout).          Triage Vitals: BP 115/62  Temp(Src) 99 F (37.2 C) (Oral)  Resp 30  Ht 5\' 6"  (1.676 m)  Wt 225 lb (102.059 kg)  BMI 36.33 kg/m2  SpO2 97%  Physical Exam  Nursing note and vitals reviewed. Constitutional: He is oriented to person, place, and time.  Awake, alert, nontoxic appearance with baseline speech for patient.  HENT:  Head: Atraumatic.  Mouth/Throat: No oropharyngeal exudate.  Eyes: EOM are normal. Pupils are equal, round, and reactive to light. Right eye exhibits no discharge. Left eye exhibits no discharge.  Neck: Neck  supple.  Cervical spine nontender  Cardiovascular: Normal rate and regular rhythm.   No murmur heard. Pulmonary/Chest: Effort normal and breath sounds normal. No stridor. No respiratory distress. He has no wheezes. He has no rales. He exhibits no tenderness.  Abdominal:  Soft. Bowel sounds are normal. He exhibits no mass. There is no tenderness. There is no rebound.  Musculoskeletal: He exhibits no tenderness.  Baseline ROM, moves extremities with no obvious new focal weakness.Musculoskeletal: Diffuse lumbar tenderness. Diffuse tenderness in both hips, both ankles, both feet. CR<2secs both hands/feet.  Lymphadenopathy:    He has no cervical adenopathy.  Neurological: He is alert and oriented to person, place, and time.  Awake, alert, cooperative and aware of situation; motor strength 5/5 arms 3/5 legs bilaterally; sensation normal to light touch bilaterally; peripheral visual fields full to confrontation; no facial asymmetry; tongue midline; major cranial nerves appear intact; no pronator drift arms but both legs too weak to hold off bed for 5 seconds, normal finger to nose bilaterally, unable to walk in ED due to leg weakness bilat  Skin: No rash noted.  Psychiatric: He has a normal mood and affect.    ED Course  Procedures (including critical care time)  DIAGNOSTIC STUDIES: Oxygen Saturation is 97% on room air, adequate by my interpretation.    COORDINATION OF CARE: 7:39 AM -Will add c-spine, lumbar spine and pelvis x-rays to orders. Patient verbalizes understanding and agrees with treatment plan.  8:14 AM- reviewed EKG. No significant change.  8:23 PM- Rechecked patient. No significant abnormality noted in labs or radiology. Will call hospitalist to discuss treatment plan. Patient verbalizes understanding and agrees with treatment plan.  Labs Review Labs Reviewed  MRSA PCR SCREENING - Abnormal; Notable for the following:    MRSA by PCR INVALID RESULTS, SPECIMEN SENT FOR CULTURE (*)    All other components within normal limits  PROTIME-INR - Abnormal; Notable for the following:    Prothrombin Time 15.5 (*)    All other components within normal limits  CBC - Abnormal; Notable for the following:    RBC 4.07 (*)    MCV 102.7 (*)    MCH 34.2 (*)     Platelets 103 (*)    All other components within normal limits  DIFFERENTIAL - Abnormal; Notable for the following:    Neutrophils Relative % 81 (*)    Lymphocytes Relative 10 (*)    Lymphs Abs 0.6 (*)    All other components within normal limits  COMPREHENSIVE METABOLIC PANEL - Abnormal; Notable for the following:    Total Protein 5.4 (*)    Albumin 2.7 (*)    GFR calc non Af Amer 84 (*)    All other components within normal limits  BLOOD GAS, ARTERIAL - Abnormal; Notable for the following:    pH, Arterial 7.465 (*)    pO2, Arterial 54.9 (*)    Bicarbonate 25.4 (*)    All other components within normal limits  BASIC METABOLIC PANEL - Abnormal; Notable for the following:    GFR calc non Af Amer 76 (*)    GFR calc Af Amer 88 (*)    All other components within normal limits  CBC - Abnormal; Notable for the following:    RBC 3.85 (*)    MCV 102.9 (*)    MCH 34.5 (*)    Platelets 109 (*)    All other components within normal limits  GLUCOSE, CAPILLARY - Abnormal; Notable for the following:    Glucose-Capillary 107 (*)  All other components within normal limits  GLUCOSE, CAPILLARY - Abnormal; Notable for the following:    Glucose-Capillary 116 (*)    All other components within normal limits  GLUCOSE, CAPILLARY - Abnormal; Notable for the following:    Glucose-Capillary 167 (*)    All other components within normal limits  GLUCOSE, CAPILLARY - Abnormal; Notable for the following:    Glucose-Capillary 125 (*)    All other components within normal limits  GLUCOSE, CAPILLARY - Abnormal; Notable for the following:    Glucose-Capillary 123 (*)    All other components within normal limits  GLUCOSE, CAPILLARY - Abnormal; Notable for the following:    Glucose-Capillary 123 (*)    All other components within normal limits  MRSA CULTURE  GLUCOSE, CAPILLARY  ETHANOL  APTT  URINE RAPID DRUG SCREEN (HOSP PERFORMED)  URINALYSIS, ROUTINE W REFLEX MICROSCOPIC  TROPONIN I  TSH   TROPONIN I  TROPONIN I  TROPONIN I  AMMONIA  GLUCOSE, CAPILLARY  GLUCOSE, CAPILLARY  GLUCOSE, CAPILLARY  RPR  HOMOCYSTEINE  VITAMIN B12  CG4 I-STAT (LACTIC ACID)   Imaging Review Ct Head Wo Contrast  11/27/2012   CLINICAL DATA:  Altered mental status.  EXAM: CT HEAD WITHOUT CONTRAST  TECHNIQUE: Contiguous axial images were obtained from the base of the skull through the vertex without intravenous contrast.  COMPARISON:  Head CT 11/20/2012 and 01/02/2012. MRI brain 11/21/2012.  FINDINGS: There is stable atrophy with ventricular dilatation and periventricular low density. No acute intracranial hemorrhage, mass lesion, brain edema or extra-axial fluid collection is demonstrated. There is no evidence of acute infarct. Chronic small vessel ischemic changes are stable.  The visualized paranasal sinuses and middle ears are clear. There is a persistent mastoid effusion on the right, unchanged. The left mastoid air cells are clear. The calvarium is intact.  IMPRESSION: Stable atrophy, ventriculomegaly and right mastoid effusion. No acute intracranial findings.   Electronically Signed   By: Roxy Horseman M.D.   On: 11/27/2012 20:03   Dg Chest Portable 1 View  11/27/2012   CLINICAL DATA:  Hypertension  EXAM: PORTABLE CHEST - 1 VIEW  COMPARISON:  11/20/2012  FINDINGS: Cardiac shadow is stable. The lungs are clear bilaterally. No acute bony abnormality is noted.  IMPRESSION: No active disease.   Electronically Signed   By: Alcide Clever M.D.   On: 11/27/2012 18:45     EKG Interpretation     Ventricular Rate:  83 PR Interval:  176 QRS Duration: 132 QT Interval:  388 QTC Calculation: 455 R Axis:   -57 Text Interpretation:  Sinus rhythm with occasional and consecutive Premature ventricular complexes Right bundle branch block Left anterior fascicular block  Bifascicular block  Abnormal ECG When compared with ECG of 11-Jun-2012 10:59, Premature ventricular complexes are now Present artifact  noted            MDM   1. Weakness of both legs   2. Acute encephalopathy   3. Atrial fibrillation   4. Gout   5. Unsteady gait   6. Generalized weakness   7. Hypertension    The patient appears reasonably stabilized for admission considering the current resources, flow, and capabilities available in the ED at this time, and I doubt any other [facility] requiring further screening and/or treatment in the ED prior to admission.  I personally performed the services described in this documentation, which was scribed in my presence. The recorded information has been reviewed and is accurate.    Scott Horn,  MD 11/27/12 2026

## 2012-11-20 NOTE — H&P (Addendum)
[facility] History and Physical  [name] WUJ:[id number] DOB: 13-Jan-1932 DOA: 11/20/2012  Referring physician: Dr. Wayland Salinas, ED physician PCP: Kirk Ruths, MD  Specialists:   Chief Complaint: lethargic, weakness  HPI: [name] is a 79 y.o. male  Who presents to the emergency room with increasing lethargy weakness. Patient has atrial fibrillation and is on anticoagulation. His brother reports that the patient had a fall approximately one week ago in which she slipped in the bathroom and fell hitting the back of his head. The patient does not have any recollection of these events. Currently, the patient is lethargic and cannot participate in history. His brother reports the patient did have some bleeding from his nose after the fall, but this was self-limited and quickly resolved. After the fall, the patient returned to his usual mental status/stated health. His brother did note that since his fall he intermittently has an unsteady gait. The patient ambulates with the assistance of a walker. He has noticed that over the past week, the patient has required more assistance with walking. He feels that the patient often sometimes will fall over.  The patient has 24 hour nursing care at home. This morning his brother was called and was told that the nurse was having a difficult time waking the patient. The patient's brother came to check on him and noted the patient was lethargic, but was answering questions. The patient was having difficulty walking he was increasingly weak. He was brought to the ER for evaluation where lab work up was essentially unremarkable. CT of the C-spine is also unremarkable. CT of the head indicates enlarged ventricles indicating a possible normal pressure hydrocephalus, but is negative for bleed. The patient has not had any fever, vomiting, diarrhea, cough, shortness of breath, chest pain or any other complaints as of late. He has not been started on any  new medications lately. He has been admitted for further workup  Review of Systems: Pertinent positives as per history of present illness, otherwise negative  Past Medical History  Diagnosis Date  . Hypertension   . Gout     Acute episode involving ankle in 12/2011  . Hyperlipidemia   . Deep vein thrombophlebitis of left leg 11/2010    LLE; S/P THA  . Fasting hyperglycemia   . Stroke 08/2009; 07/2010    TIA in 07/2010  . H/O heat stroke 08/2009  . Prostate cancer 2013    adenocarcinoma; Gleason grade 8; PSA 9.45; EBRT 2013-14  . Renal cyst   . Syncope     01/2012: Negative pharmacologic stress nuclear  . PAF (paroxysmal atrial fibrillation)     a. first noted 01/2012 during admission for URI;  b. seen again 03/2012 after nstemi of unknown origin - pradaxa initiated.  Marland Kitchen CAD (coronary artery disease)     a. 01/2012 Elev Ti-->nonischemic MV;  b. 03/2012 NSTEMI/Cath: LM 50, LAD 50p/m, 73m/d, LCX nl, RI 60ost sup branch, RCA diff nonobs, EF 55%   Past Surgical History  Procedure Laterality Date  . Hip arthroplasty  12/08/2010    ARTHROPLASTY BIPOLAR HIP;  Surgeon-Olin; Left  . 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 I&d extremity  05/28/2011    Procedure: IRRIGATION AND DEBRIDEMENT EXTREMITY;  Surgeon: Sharma Covert, MD;  Location: [facility] OR;  Service: Orthopedics;  Laterality: Right;  I & D Right hand/wrist  . Colonoscopy   07/18/2003    RMR: Rectum  internal hemorrhoids.  Otherwise, normal rectum/ Left-sided diverticula.  Remainder of colonic mucosa-nl   Social History:  reports that he quit smoking about 64 years ago. His smoking use included Cigarettes and Cigars. He smoked 0.00 packs per day. His smokeless tobacco use includes Chew. He reports that he does not drink alcohol or use illicit drugs.   Allergies  Allergen Reactions  . Tetanus Toxoids Swelling    Also allergic to "high powered pain medications."    Family History  Problem Relation Age of  Onset  . Prostate cancer Brother   . Colon cancer Sister 106  . Diabetes Mellitus II Father   . Stroke Father     Prior to Admission medications   Medication Sig Start Date End Date Taking? Authorizing Provider  allopurinol (ZYLOPRIM) 300 MG tablet Take 300 mg by mouth daily.   Yes Historical Provider, MD  ascorbic acid (VITAMIN C) 500 MG tablet Take 500 mg by mouth every morning.    Yes Historical Provider, MD  aspirin EC 81 MG tablet Take 81 mg by mouth daily.   Yes Historical Provider, MD  calcium-vitamin D (OSCAL WITH D) 500-200 MG-UNIT per tablet Take 1 tablet by mouth every morning.    Yes Historical Provider, MD  cholecalciferol (VITAMIN D) 1000 UNITS tablet Take 1,000 Units by mouth daily.   Yes Historical Provider, MD  COD LIVER OIL PO Take 1 tablet by mouth daily.    Yes Historical Provider, MD  dabigatran (PRADAXA) 150 MG CAPS Take 150 mg by mouth every 12 (twelve) hours.    Yes Historical Provider, MD  diltiazem (CARDIZEM CD) 180 MG 24 hr capsule Take 180 mg by mouth daily.   Yes Historical Provider, MD  enalapril (VASOTEC) 5 MG tablet Take 2.5 mg by mouth daily.    Yes Historical Provider, MD  furosemide (LASIX) 20 MG tablet Take 20 mg by mouth daily.   Yes Historical Provider, MD  metoprolol (LOPRESSOR) 50 MG tablet Take 50 mg by mouth 2 (two) times daily.     Yes Historical Provider, MD  Multiple Vitamin (MULITIVITAMIN WITH MINERALS) TABS Take 1 tablet by mouth every morning.    Yes Historical Provider, MD  simvastatin (ZOCOR) 20 MG tablet Take 20 mg by mouth at bedtime.    Yes Historical Provider, MD  glipiZIDE (GLUCOTROL XL) 2.5 MG 24 hr tablet Take 2.5 mg by mouth daily as needed (blood sugar).     Historical Provider, MD  HYDROcodone-acetaminophen (NORCO/VICODIN) 5-325 MG per tablet Take 1 tablet by mouth every 6 (six) hours as needed for pain.    Historical Provider, MD  INDOMETHACIN PO Take 1 tablet by mouth daily as needed (gout).    Historical Provider, MD   oxyCODONE-acetaminophen (PERCOCET/ROXICET) 5-325 MG per tablet Take 1 tablet by mouth every 4 (four) hours as needed for pain. 06/11/12   Joya [name], MD  predniSONE (DELTASONE) 10 MG tablet Take 10 mg by mouth daily as needed (severe gout).    Historical Provider, MD  tiZANidine (ZANAFLEX) 4 MG tablet Take 4 mg by mouth 3 (three) times daily as needed (muscle spasm).    Historical Provider, MD   Physical Exam: Filed Vitals:   11/20/12 0952  BP: 151/76  Pulse: 88  Temp: 98.6 F (37 C)  Resp:      General:  Patient is lethargic, responds to voice but quickly falls back asleep  Eyes: Pupils are equal, round, reactive to light  ENT: Mucous membranes are moist  Neck:  Supple, no meningismus  Cardiovascular: S1, S2, irregular  Respiratory: Clear to auscultation bilaterally  Abdomen: Soft, nontender, positive bowel sounds  Skin: Erythematous/scaly rash noted on patient's left knee and upper thigh  Musculoskeletal: Joints not appear to be inflamed, 1-2+ pitting edema in the lower extremity bilaterally  Psychiatric: Lethargic  Neurologic: No facial asymmetry, patient is lethargic, strength is 5 over 5 in the upper extremities, 2-3/5 in lower extremities bilaterally, reflexes in the lower extremities are intact, plantars are downgoing  Labs on Admission:  Basic Metabolic Panel:  Recent Labs Lab 11/20/12 0720  NA 142  K 3.6  CL 107  CO2 25  GLUCOSE 92  BUN 22  CREATININE 0.78  CALCIUM 10.0   Liver Function Tests:  Recent Labs Lab 11/20/12 0720  AST 20  ALT 29  ALKPHOS 66  BILITOT 0.4  PROT 5.4*  ALBUMIN 2.7*   No results found for this basename: LIPASE, AMYLASE,  in the last 168 hours No results found for this basename: AMMONIA,  in the last 168 hours CBC:  Recent Labs Lab 11/20/12 0720  WBC 6.2  NEUTROABS 5.0  HGB 13.9  HCT 41.8  MCV 102.7*  PLT 103*   Cardiac Enzymes:  Recent Labs Lab 11/20/12 0720  TROPONINI <0.30    BNP (last 3  results)  Recent Labs  05/04/12 2006  PROBNP 353.7   CBG:  Recent Labs Lab 11/20/12 0652  GLUCAP 82    Radiological Exams on Admission: Dg Chest 1 View  11/20/2012   CLINICAL DATA:  Pain post trauma  EXAM: CHEST - 1 VIEW  COMPARISON:  Jun 11, 2012  FINDINGS: Lungs are clear. Heart size and pulmonary vascularity are normal. Mild mediastinal prominence compared to the prior study is probably due to supine as opposed to upright positioning. No adenopathy. No bone lesions. No pneumothorax.  IMPRESSION: No edema or consolidation. No pneumothorax. Relative prominence the mediastinum is probably due to supine positioning coupled with great vessel prominence.   Electronically Signed   By: Bretta Bang M.D.   On: 11/20/2012 08:05   Dg Lumbar Spine Complete  11/20/2012   CLINICAL DATA:  Pain post trauma  EXAM: LUMBAR SPINE - COMPLETE 4+ VIEW  COMPARISON:  CT abdomen and pelvis was sagittal reformats November 09, 2011  FINDINGS: Frontal, lateral, spot lumbosacral lateral, and bilateral oblique views were obtained. There are 5 non-rib-bearing lumbar type vertebral bodies. There is levoscoliosis. There is no fracture or spondylolisthesis. There is marked disc space narrowing at L1-2, L2-3, and L3-4. These are stable findings. There is facet osteoarthritic change to varying degrees at all levels. There is atherosclerotic change in the aorta.  IMPRESSION:  There is multilevel osteoarthritic change. Scoliosis. No acute fracture or spondylolisthesis.   Electronically Signed   By: Bretta Bang M.D.   On: 11/20/2012 08:07   Dg Pelvis 1-2 Views  11/20/2012   CLINICAL DATA:  Pain post trauma  EXAM: PELVIS - 1-2 VIEW  COMPARISON:  Jun 11, 2012  FINDINGS: There is a total hip prosthesis on the left which appears well-seated. There is no demonstrable acute fracture or dislocation. There is moderate narrowing of the right hip joint. There are surgical clips overlying the pubic symphysis. Bones are  osteoporotic. There are vascular calcifications in the pelvis.  IMPRESSION: No acute fracture or dislocation. Total hip prosthesis on the left. Moderate narrowing right hip joint. Bones are osteoporotic.   Electronically Signed   By: Bretta Bang M.D.   On: 11/20/2012  08:04   Ct Cervical Spine Wo Contrast  11/20/2012   CLINICAL DATA:  Pain and altered mental status post trauma  EXAM: CT HEAD WITHOUT CONTRAST  CT CERVICAL SPINE WITHOUT CONTRAST  TECHNIQUE: Multidetector CT imaging of the head and cervical spine was performed following the standard protocol without intravenous contrast. Multiplanar CT image reconstructions of the cervical spine were also generated.  COMPARISON:  Brain CT January 02, 2012  FINDINGS: CT HEAD FINDINGS  There is moderate generalized ventricular enlargement with mild sulcal enlargement. These are stable findings.  There is no mass, hemorrhage, extra-axial fluid collection, or midline shift. There is mild small vessel disease in the centra semiovale bilaterally, stable. There is a small tiny prior lacunar infarct in the superior left cerebellum. There is no new gray-white compartment lesion. No acute infarct.  Bony calvarium appears intact. Mastoid air cells on the left are clear. There is opacification of multiple inferior mastoid air cells on the right, a stable finding.  CT CERVICAL SPINE FINDINGS  There is no fracture. Minimal anterolisthesis of C3 on C4 is felt to be due to spondylosis. There is no other spondylolisthesis. Prevertebral soft tissues and predental space regions are normal. There is marked disc space narrowing at C5-6. There is modest disc space narrowing at C3-4. Other disc spaces appear intact.  There is multilevel facet hypertrophy, most marked at C3-4 on the left, C4-5 bilaterally, C5-6 bilaterally, more on the right than on the left, and C7-T1 on the left. There is exit foraminal narrowing due to bony hypertrophy at C3-4 on the left at C5-6 on the right. The  there is no frank disc extrusion. There is mild stenosis at C5-6 due to bony hypertrophy and diffuse disc bulging.  There is calcification in both carotid arteries. Chronic mastoid disease on the right is noted in the head CT report. Bones do appear somewhat osteoporotic.  IMPRESSION: CT head: Stable atrophy. The ventricles are in proportion larger than sulci. This finding raises concern for a degree of concomitant normal pressure hydrocephalus.  There is no intracranial mass or hemorrhage. There is no acute appearing infarct. There is evidence of a small lacunar infarct in the left cerebellum. There is mild periventricular small vessel disease. There is chronic mastoid disease on the right.  CT cervical spine: Extensive arthropathy. Mild spinal stenosis at C5-6 the, multifactorial. No fracture. Slight anterolisthesis of C3 on C4 is felt to be due to spondylosis. No other spondylolisthesis seen.   Electronically Signed   By: Bretta Bang M.D.   On: 11/20/2012 08:18    EKG: Independently reviewed. No new significant ST-T chagnes  Assessment/Plan Active Problems:   Hypertension   Gout   Atrial fibrillation   Unsteady gait   Acute encephalopathy   Generalized weakness   Chronic anticoagulation   Thrombocytopenia, unspecified   1. Acute encephalopathy. Etiology is not entirely clear at this time. CT of the head is relatively unremarkable for acute findings. Patient will need MRI of the brain for further characterization. We'll request a neurology consultation. We'll also check ABG, TSH, ammonia. Urinalysis and urine drug screen are unremarkable. We will hold any sedative medications. Blood glucose was also checked and found to be normal. 2. Unsteady gait with lower extremity weakness. His reflexes appear to be intact. This could be related to his mental status/generalized deconditioning. But with his history of falls, osteoporotic bones, history of prostate cancer and being on anticoagulation, we  will pursue further imaging with MRI of the T./L. spines.  X-rays of the lumbar spine were unremarkable. We'll do neuro checks every 4 hours to assess for any progression of weakness. 3. Atrial fibrillation. Continue rate control medications, only if the patient is awake enough to take medications by mouth. We'll hold anticoagulation for now. Safety of anticoagulation will be need to be reassessed in this patient who is prone to fall. 4. Hypertension. Stable 5. Gout. Does not appear to be an acute flare. Continue allopurinol. 6.  Pedal edema. Start the patient on IV Lasix.  Code Status: full code Family Communication: discussed with patient's brother who is his POA Disposition Plan: pending hospital course  Time spent:  Mattix Imhof [facility] Pager [phone]  If 7PM-7AM, please contact night-coverage [email or web address] Password [email or web address] 11/20/2012, 1:41 PM

## 2012-11-20 NOTE — ED Notes (Signed)
EKG already done and given to EDP

## 2012-11-20 NOTE — ED Notes (Signed)
Paged dr. Kerry Hough to dr. Fonnie Jarvis

## 2012-11-20 NOTE — ED Provider Notes (Signed)
MSE was initiated and I personally evaluated the patient and placed orders (if any) at  6:52 AM on November 20, 2012.  The patient appears stable so that the remainder of the MSE may be completed by another provider. Per EMS, pt woke up confused with generalized weakness Labs/imaging initiated  Joya [name], MD 11/20/12 [phone]

## 2012-11-21 ENCOUNTER — Inpatient Hospital Stay (HOSPITAL_COMMUNITY): Payer: Medicare Other

## 2012-11-21 ENCOUNTER — Inpatient Hospital Stay (HOSPITAL_COMMUNITY)
Admit: 2012-11-21 | Discharge: 2012-11-21 | Disposition: A | Discharge status: Home / Self Care | Payer: Medicare Other | Attending: Neurology | Admitting: Neurology

## 2012-11-21 DIAGNOSIS — R269 Unspecified abnormalities of gait and mobility: Secondary | ICD-10-CM

## 2012-11-21 LAB — GLUCOSE, CAPILLARY
Glucose-Capillary: 85 mg/dL (ref 70–99)
Glucose-Capillary: 107 mg/dL — ABNORMAL HIGH (ref 70–99)

## 2012-11-21 LAB — BASIC METABOLIC PANEL
BUN: 18 mg/dL (ref 6–23)
CO2: 28 mEq/L (ref 19–32)
Calcium: 9.7 mg/dL (ref 8.4–10.5)
Chloride: 104 mEq/L (ref 96–112)
Creatinine, Ser: 0.98 mg/dL (ref 0.50–1.35)
GFR calc Af Amer: 88 mL/min — ABNORMAL LOW (ref >90)
GFR calc non Af Amer: 76 mL/min — ABNORMAL LOW (ref >90)

## 2012-11-21 LAB — TROPONIN I: Troponin I: <0.3 ng/mL (ref <0.30)

## 2012-11-21 LAB — CBC
HCT: 39.6 % (ref 39.0–52.0)
MCH: 34.5 pg — ABNORMAL HIGH (ref 26.0–34.0)
MCHC: 33.6 g/dL (ref 30.0–36.0)
MCV: 102.9 fL — ABNORMAL HIGH (ref 78.0–100.0)
Platelets: 109 10*3/uL — ABNORMAL LOW (ref 150–400)
RDW: 14.7 % (ref 11.5–15.5)

## 2012-11-21 NOTE — Progress Notes (Signed)
EEG Completed; Results Pending  

## 2012-11-21 NOTE — Progress Notes (Signed)
UR chart review completed.  

## 2012-11-21 NOTE — Consult Note (Signed)
[facility] Danel Requena A. Gerilyn Pilgrim, MD     [email or web address]          [name] is an 79 y.o. male.   ASSESSMENT/PLAN:  1. Altered mental status/confusion of unclear etiology. The differential diagnosis includes medication effect, toxic metabolic processes and acute stroke. In addition to brain MRI and EEG will be obtained. Consider reduction/discontinuation of some of the patient's psychotropic medications including pain medication.   2. Acute gait impairment likely multifactorial but the differential diagnosis is as above including acute stroke, medication effect, osteoarthritis, chronic low back pain, gout and aging. There appears to be no evidence of treatable neurological problems as Parkinson disease or myelopathy. A brain MRI and MRA and MRI of the lumbar spine has been obtained which is appropriate. I do not think that a thoracic spine MRI is needed. Additional blood tests will also be obtained including thyroid function test and vitamin B12.   The patient is a 79 year old white male who has multiple comorbidities. He presents with the marked drowsiness and encephalopathy along with gait instability and ataxia. He fell once on the day of admission and also fell a week before. The patient is drowsy still and cannot provide an adequate history. The history is obtained from the chart and also from talking to the patient's brother. Brother reports that the patient was quite drowsy on the day of presentation. He reports that the patient was quite unsteady and fell once. This resulted the patient come into the emergency room for evaluation. The brother reports that the patient has had significant swelling of the ankles and feet. He does have a history of gout especially involving the left great toe. This has not been a issue recently however. The patient is still drowsy and unable to provide an adequate history as to why he is here. He knows that he is in the hospital however. He  does not report other complaints at this time. There is no chest pain, shortness of breath, headaches, focal numbness or weakness. The review systems is essentially unremarkable.  GENERAL: This is a obese man who appears slightly dysmetric.  HEENT: Neck is supple. Head is normocephalic and atraumatic.  ABDOMEN: soft  EXTREMITIES: No edema. There is marked arthritic changes of the hands and knees and feet bilaterally. Mild soreness to palpation involving the left great toe but no swelling.   BACK: Unremarkable.  SKIN: Normal by inspection.    MENTAL STATUS: He is laying in bed with eyes closed but arousable to verbal commands. He does follow commands well. There is no dysarthria. The patient is oriented to person, place year and month.  CRANIAL NERVES: Pupils are equal, round and reactive to light and accommodation; extra ocular movements are full, there is no significant nystagmus; visual fields are full; upper and lower facial muscles are normal in strength and symmetric, there is no flattening of the nasolabial folds; tongue is midline; uvula is midline; shoulder elevation is normal.  MOTOR: Normal tone, bulk and strength; no pronator drift.  COORDINATION: Left finger to nose is normal, right finger to nose is normal, No rest tremor; no intention tremor; no postural tremor; no bradykinesia.  REFLEXES: Deep tendon reflexes are symmetrical and normal. Plantar responses are flexor bilaterally.   SENSATION: Normal to light touch.   The patient had CT scan is reviewed in person does show evidence of ventriculomegaly but I do not believe that this is out of proportion to the associated degree of atrophy. No acute changes.  Past Medical History  Diagnosis Date  . Hypertension   . Gout     Acute episode involving ankle in 12/2011  . Hyperlipidemia   . Deep vein thrombophlebitis of left leg 11/2010    LLE; S/P THA  . Fasting hyperglycemia   . Stroke 08/2009; 07/2010    TIA in 07/2010    . H/O heat stroke 08/2009  . Prostate cancer 2013    adenocarcinoma; Gleason grade 8; PSA 9.45; EBRT 2013-14  . Renal cyst   . Syncope     01/2012: Negative pharmacologic stress nuclear  . PAF (paroxysmal atrial fibrillation)     a. first noted 01/2012 during admission for URI;  b. seen again 03/2012 after nstemi of unknown origin - pradaxa initiated.  Marland Kitchen CAD (coronary artery disease)     a. 01/2012 Elev Ti-->nonischemic MV;  b. 03/2012 NSTEMI/Cath: LM 50, LAD 50p/m, 10m/d, LCX nl, RI 60ost sup branch, RCA diff nonobs, EF 55%    Past Surgical History  Procedure Laterality Date  . Hip arthroplasty  12/08/2010    ARTHROPLASTY BIPOLAR HIP;  Surgeon-Olin; Left  . 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 I&d extremity  05/28/2011    Procedure: IRRIGATION AND DEBRIDEMENT EXTREMITY;  Surgeon: Sharma Covert, MD;  Location: [facility] OR;  Service: Orthopedics;  Laterality: Right;  I & D Right hand/wrist  . Colonoscopy   07/18/2003    RMR: Rectum internal hemorrhoids.  Otherwise, normal rectum/ Left-sided diverticula.  Remainder of colonic mucosa-nl    Family History  Problem Relation Age of Onset  . Prostate cancer Brother   . Colon cancer Sister 10  . Diabetes Mellitus II Father   . Stroke Father     Social History:  reports that he quit smoking about 64 years ago. His smoking use included Cigarettes and Cigars. He smoked 0.00 packs per day. His smokeless tobacco use includes Chew. He reports that he does not drink alcohol or use illicit drugs.  Allergies:  Allergies  Allergen Reactions  . Tetanus Toxoids Swelling    Also allergic to "high powered pain medications."    Medications: Prior to Admission medications   Medication Sig Start Date End Date Taking? Authorizing Provider  allopurinol (ZYLOPRIM) 300 MG tablet Take 300 mg by mouth daily.   Yes Historical Provider, MD  ascorbic acid (VITAMIN C) 500 MG tablet Take 500 mg by mouth every morning.    Yes  Historical Provider, MD  aspirin EC 81 MG tablet Take 81 mg by mouth daily.   Yes Historical Provider, MD  calcium-vitamin D (OSCAL WITH D) 500-200 MG-UNIT per tablet Take 1 tablet by mouth every morning.    Yes Historical Provider, MD  cholecalciferol (VITAMIN D) 1000 UNITS tablet Take 1,000 Units by mouth daily.   Yes Historical Provider, MD  COD LIVER OIL PO Take 1 tablet by mouth daily.    Yes Historical Provider, MD  dabigatran (PRADAXA) 150 MG CAPS Take 150 mg by mouth every 12 (twelve) hours.    Yes Historical Provider, MD  diltiazem (CARDIZEM CD) 180 MG 24 hr capsule Take 180 mg by mouth daily.   Yes Historical Provider, MD  enalapril (VASOTEC) 5 MG tablet Take 2.5 mg by mouth daily.    Yes Historical Provider, MD  furosemide (LASIX) 20 MG tablet Take 20 mg by mouth daily.   Yes Historical Provider, MD  metoprolol (LOPRESSOR) 50 MG tablet Take 50 mg  by mouth 2 (two) times daily.     Yes Historical Provider, MD  Multiple Vitamin (MULITIVITAMIN WITH MINERALS) TABS Take 1 tablet by mouth every morning.    Yes Historical Provider, MD  simvastatin (ZOCOR) 20 MG tablet Take 20 mg by mouth at bedtime.    Yes Historical Provider, MD  glipiZIDE (GLUCOTROL XL) 2.5 MG 24 hr tablet Take 2.5 mg by mouth daily as needed (blood sugar).     Historical Provider, MD  HYDROcodone-acetaminophen (NORCO/VICODIN) 5-325 MG per tablet Take 1 tablet by mouth every 6 (six) hours as needed for pain.    Historical Provider, MD  INDOMETHACIN PO Take 1 tablet by mouth daily as needed (gout).    Historical Provider, MD  oxyCODONE-acetaminophen (PERCOCET/ROXICET) 5-325 MG per tablet Take 1 tablet by mouth every 4 (four) hours as needed for pain. 06/11/12   Joya [name], MD  predniSONE (DELTASONE) 10 MG tablet Take 10 mg by mouth daily as needed (severe gout).    Historical Provider, MD  tiZANidine (ZANAFLEX) 4 MG tablet Take 4 mg by mouth 3 (three) times daily as needed (muscle spasm).    Historical Provider, MD     Scheduled Meds: . allopurinol  300 mg Oral Daily  . aspirin EC  81 mg Oral Daily  . atorvastatin  10 mg Oral QHS  . diltiazem  180 mg Oral Daily  . furosemide  20 mg Intravenous BID  . influenza vac split quadrivalent PF  0.5 mL Intramuscular Tomorrow-1000  . insulin aspart  0-15 Units Subcutaneous TID WC  . insulin aspart  0-5 Units Subcutaneous QHS  . metoprolol  50 mg Oral BID  . sodium chloride  3 mL Intravenous Q12H  . sodium chloride  3 mL Intravenous Q12H   Continuous Infusions:  PRN Meds:.sodium chloride, acetaminophen, acetaminophen, ondansetron (ZOFRAN) IV, ondansetron, sodium chloride   Blood pressure 91/51, pulse 104, temperature 98.4 F (36.9 C), temperature source Axillary, resp. rate 18, height 5\' 6"  (1.676 m), weight 102.059 kg (225 lb), SpO2 97.00%.   Results for orders placed during the hospital encounter of 11/20/12 (from the past 48 hour(s))  GLUCOSE, CAPILLARY     Status: None   Collection Time    11/20/12  6:52 AM      Result Value Range   Glucose-Capillary 82  70 - 99 mg/dL  URINE RAPID DRUG SCREEN (HOSP PERFORMED)     Status: None   Collection Time    11/20/12  7:15 AM      Result Value Range   Opiates NONE DETECTED  NONE DETECTED   Cocaine NONE DETECTED  NONE DETECTED   Benzodiazepines NONE DETECTED  NONE DETECTED   Amphetamines NONE DETECTED  NONE DETECTED   Tetrahydrocannabinol NONE DETECTED  NONE DETECTED   Barbiturates NONE DETECTED  NONE DETECTED   Comment:            DRUG SCREEN FOR MEDICAL PURPOSES     ONLY.  IF CONFIRMATION IS NEEDED     FOR ANY PURPOSE, NOTIFY LAB     WITHIN 5 DAYS.                LOWEST DETECTABLE LIMITS     FOR URINE DRUG SCREEN     Drug Class       Cutoff (ng/mL)     Amphetamine      1000     Barbiturate      200     Benzodiazepine   200  Tricyclics       300     Opiates          300     Cocaine          300     THC              50  URINALYSIS, ROUTINE W REFLEX MICROSCOPIC     Status: None    Collection Time    11/20/12  7:15 AM      Result Value Range   Color, Urine YELLOW  YELLOW   APPearance CLEAR  CLEAR   Specific Gravity, Urine 1.015  1.005 - 1.030   pH 6.5  5.0 - 8.0   Glucose, UA NEGATIVE  NEGATIVE mg/dL   Hgb urine dipstick NEGATIVE  NEGATIVE   Bilirubin Urine NEGATIVE  NEGATIVE   Ketones, ur NEGATIVE  NEGATIVE mg/dL   Protein, ur NEGATIVE  NEGATIVE mg/dL   Urobilinogen, UA 0.2  0.0 - 1.0 mg/dL   Nitrite NEGATIVE  NEGATIVE   Leukocytes, UA NEGATIVE  NEGATIVE   Comment: MICROSCOPIC NOT DONE ON URINES WITH NEGATIVE PROTEIN, BLOOD, LEUKOCYTES, NITRITE, OR GLUCOSE <1000 mg/dL.  ETHANOL     Status: None   Collection Time    11/20/12  7:20 AM      Result Value Range   Alcohol, Ethyl (B) <11  0 - 11 mg/dL   Comment:            LOWEST DETECTABLE LIMIT FOR     SERUM ALCOHOL IS 11 mg/dL     FOR MEDICAL PURPOSES ONLY  PROTIME-INR     Status: Abnormal   Collection Time    11/20/12  7:20 AM      Result Value Range   Prothrombin Time 15.5 (*) 11.6 - 15.2 seconds   INR 1.26  0.00 - 1.49  APTT     Status: None   Collection Time    11/20/12  7:20 AM      Result Value Range   aPTT 36  24 - 37 seconds  CBC     Status: Abnormal   Collection Time    11/20/12  7:20 AM      Result Value Range   WBC 6.2  4.0 - 10.5 K/uL   RBC 4.07 (*) 4.22 - 5.81 MIL/uL   Hemoglobin 13.9  13.0 - 17.0 g/dL   HCT 40.9  81.1 - 91.4 %   MCV 102.7 (*) 78.0 - 100.0 fL   MCH 34.2 (*) 26.0 - 34.0 pg   MCHC 33.3  30.0 - 36.0 g/dL   RDW 78.2  95.6 - 21.3 %   Platelets 103 (*) 150 - 400 K/uL   Comment: SPECIMEN CHECKED FOR CLOTS     PLATELET COUNT CONFIRMED BY SMEAR  DIFFERENTIAL     Status: Abnormal   Collection Time    11/20/12  7:20 AM      Result Value Range   Neutrophils Relative % 81 (*) 43 - 77 %   Neutro Abs 5.0  1.7 - 7.7 K/uL   Lymphocytes Relative 10 (*) 12 - 46 %   Lymphs Abs 0.6 (*) 0.7 - 4.0 K/uL   Monocytes Relative 8  3 - 12 %   Monocytes Absolute 0.5  0.1 - 1.0 K/uL    Eosinophils Relative 2  0 - 5 %   Eosinophils Absolute 0.1  0.0 - 0.7 K/uL   Basophils Relative 0  0 - 1 %   Basophils  Absolute 0.0  0.0 - 0.1 K/uL  COMPREHENSIVE METABOLIC PANEL     Status: Abnormal   Collection Time    11/20/12  7:20 AM      Result Value Range   Sodium 142  135 - 145 mEq/L   Potassium 3.6  3.5 - 5.1 mEq/L   Chloride 107  96 - 112 mEq/L   CO2 25  19 - 32 mEq/L   Glucose, Bld 92  70 - 99 mg/dL   BUN 22  6 - 23 mg/dL   Creatinine, Ser 1.61  0.50 - 1.35 mg/dL   Calcium 09.6  8.4 - 04.5 mg/dL   Total Protein 5.4 (*) 6.0 - 8.3 g/dL   Albumin 2.7 (*) 3.5 - 5.2 g/dL   AST 20  0 - 37 U/L   ALT 29  0 - 53 U/L   Alkaline Phosphatase 66  39 - 117 U/L   Total Bilirubin 0.4  0.3 - 1.2 mg/dL   GFR calc non Af Amer 84 (*) >90 mL/min   GFR calc Af Amer >90  >90 mL/min   Comment: (NOTE)     The eGFR has been calculated using the CKD EPI equation.     This calculation has not been validated in all clinical situations.     eGFR's persistently <90 mL/min signify possible Chronic Kidney     Disease.  TROPONIN I     Status: None   Collection Time    11/20/12  7:20 AM      Result Value Range   Troponin I <0.30  <0.30 ng/mL   Comment:            Due to the release kinetics of cTnI,     a negative result within the first hours     of the onset of symptoms does not rule out     myocardial infarction with certainty.     If myocardial infarction is still suspected,     repeat the test at appropriate intervals.  CG4 I-STAT (LACTIC ACID)     Status: None   Collection Time    11/20/12  7:40 AM      Result Value Range   Lactic Acid, Venous 1.67  0.5 - 2.2 mmol/L  MRSA PCR SCREENING     Status: Abnormal   Collection Time    11/20/12 10:45 AM      Result Value Range   MRSA by PCR INVALID RESULTS, SPECIMEN SENT FOR CULTURE (*) NEGATIVE   Comment: RESULT CALLED TO, READ BACK BY AND VERIFIED WITH:     ABBIE,RN AT 1151 ON 11/20/12 BY MOSLEY,J                The GeneXpert MRSA Assay  (FDA     approved for NASAL specimens     only), is one component of a     comprehensive MRSA colonization     surveillance program. It is not     intended to diagnose MRSA     infection nor to guide or     monitor treatment for     MRSA infections.  TSH     Status: None   Collection Time    11/20/12  1:54 PM      Result Value Range   TSH 1.347  0.350 - 4.500 uIU/mL   Comment: Performed at [facility]  TROPONIN I     Status: None   Collection Time    11/20/12  1:54 PM  Result Value Range   Troponin I <0.30  <0.30 ng/mL   Comment:            Due to the release kinetics of cTnI,     a negative result within the first hours     of the onset of symptoms does not rule out     myocardial infarction with certainty.     If myocardial infarction is still suspected,     repeat the test at appropriate intervals.  AMMONIA     Status: None   Collection Time    11/20/12  1:54 PM      Result Value Range   Ammonia 19  11 - 60 umol/L  BLOOD GAS, ARTERIAL     Status: Abnormal   Collection Time    11/20/12  3:15 PM      Result Value Range   FIO2 21.00     pH, Arterial 7.465 (*) 7.350 - 7.450   pCO2 arterial 35.9  35.0 - 45.0 mmHg   pO2, Arterial 54.9 (*) 80.0 - 100.0 mmHg   Bicarbonate 25.4 (*) 20.0 - 24.0 mEq/L   TCO2 22.3  0 - 100 mmol/L   Acid-Base Excess 2.0  0.0 - 2.0 mmol/L   O2 Saturation 89.5     Patient temperature 37.0     Collection site RIGHT RADIAL     Drawn by COLLECTED BY RT     Sample type ARTERIAL     Allens test (pass/fail) PASS  PASS  GLUCOSE, CAPILLARY     Status: None   Collection Time    11/20/12  4:41 PM      Result Value Range   Glucose-Capillary 88  70 - 99 mg/dL  TROPONIN I     Status: None   Collection Time    11/20/12  7:26 PM      Result Value Range   Troponin I <0.30  <0.30 ng/mL   Comment:            Due to the release kinetics of cTnI,     a negative result within the first hours     of the onset of symptoms does not rule out      myocardial infarction with certainty.     If myocardial infarction is still suspected,     repeat the test at appropriate intervals.  GLUCOSE, CAPILLARY     Status: None   Collection Time    11/20/12  9:26 PM      Result Value Range   Glucose-Capillary 92  70 - 99 mg/dL   Comment 1 Notify RN    TROPONIN I     Status: None   Collection Time    11/21/12  2:11 AM      Result Value Range   Troponin I <0.30  <0.30 ng/mL   Comment:            Due to the release kinetics of cTnI,     a negative result within the first hours     of the onset of symptoms does not rule out     myocardial infarction with certainty.     If myocardial infarction is still suspected,     repeat the test at appropriate intervals.  BASIC METABOLIC PANEL     Status: Abnormal   Collection Time    11/21/12  2:11 AM      Result Value Range   Sodium 141  135 - 145 mEq/L   Potassium  3.5  3.5 - 5.1 mEq/L   Chloride 104  96 - 112 mEq/L   CO2 28  19 - 32 mEq/L   Glucose, Bld 84  70 - 99 mg/dL   BUN 18  6 - 23 mg/dL   Creatinine, Ser 4.78  0.50 - 1.35 mg/dL   Calcium 9.7  8.4 - 29.5 mg/dL   GFR calc non Af Amer 76 (*) >90 mL/min   GFR calc Af Amer 88 (*) >90 mL/min   Comment: (NOTE)     The eGFR has been calculated using the CKD EPI equation.     This calculation has not been validated in all clinical situations.     eGFR's persistently <90 mL/min signify possible Chronic Kidney     Disease.  CBC     Status: Abnormal   Collection Time    11/21/12  2:11 AM      Result Value Range   WBC 6.6  4.0 - 10.5 K/uL   RBC 3.85 (*) 4.22 - 5.81 MIL/uL   Hemoglobin 13.3  13.0 - 17.0 g/dL   HCT 62.1  30.8 - 65.7 %   MCV 102.9 (*) 78.0 - 100.0 fL   MCH 34.5 (*) 26.0 - 34.0 pg   MCHC 33.6  30.0 - 36.0 g/dL   RDW 84.6  96.2 - 95.2 %   Platelets 109 (*) 150 - 400 K/uL   Comment: SPECIMEN CHECKED FOR CLOTS     PLATELET COUNT CONFIRMED BY SMEAR  GLUCOSE, CAPILLARY     Status: None   Collection Time    11/21/12  7:26 AM       Result Value Range   Glucose-Capillary 85  70 - 99 mg/dL   Comment 1 Documented in Chart     Comment 2 Notify RN      Dg Chest 1 View  11/20/2012   CLINICAL DATA:  Pain post trauma  EXAM: CHEST - 1 VIEW  COMPARISON:  Jun 11, 2012  FINDINGS: Lungs are clear. Heart size and pulmonary vascularity are normal. Mild mediastinal prominence compared to the prior study is probably due to supine as opposed to upright positioning. No adenopathy. No bone lesions. No pneumothorax.  IMPRESSION: No edema or consolidation. No pneumothorax. Relative prominence the mediastinum is probably due to supine positioning coupled with great vessel prominence.   Electronically Signed   By: Bretta Bang M.D.   On: 11/20/2012 08:05   Dg Lumbar Spine Complete  11/20/2012   CLINICAL DATA:  Pain post trauma  EXAM: LUMBAR SPINE - COMPLETE 4+ VIEW  COMPARISON:  CT abdomen and pelvis was sagittal reformats November 09, 2011  FINDINGS: Frontal, lateral, spot lumbosacral lateral, and bilateral oblique views were obtained. There are 5 non-rib-bearing lumbar type vertebral bodies. There is levoscoliosis. There is no fracture or spondylolisthesis. There is marked disc space narrowing at L1-2, L2-3, and L3-4. These are stable findings. There is facet osteoarthritic change to varying degrees at all levels. There is atherosclerotic change in the aorta.  IMPRESSION:  There is multilevel osteoarthritic change. Scoliosis. No acute fracture or spondylolisthesis.   Electronically Signed   By: Bretta Bang M.D.   On: 11/20/2012 08:07   Dg Pelvis 1-2 Views  11/20/2012   CLINICAL DATA:  Pain post trauma  EXAM: PELVIS - 1-2 VIEW  COMPARISON:  Jun 11, 2012  FINDINGS: There is a total hip prosthesis on the left which appears well-seated. There is no demonstrable acute fracture or dislocation. There is moderate narrowing  of the right hip joint. There are surgical clips overlying the pubic symphysis. Bones are osteoporotic. There are vascular  calcifications in the pelvis.  IMPRESSION: No acute fracture or dislocation. Total hip prosthesis on the left. Moderate narrowing right hip joint. Bones are osteoporotic.   Electronically Signed   By: Bretta Bang M.D.   On: 11/20/2012 08:04   Ct Cervical Spine Wo Contrast  11/20/2012   CLINICAL DATA:  Pain and altered mental status post trauma  EXAM: CT HEAD WITHOUT CONTRAST  CT CERVICAL SPINE WITHOUT CONTRAST  TECHNIQUE: Multidetector CT imaging of the head and cervical spine was performed following the standard protocol without intravenous contrast. Multiplanar CT image reconstructions of the cervical spine were also generated.  COMPARISON:  Brain CT January 02, 2012  FINDINGS: CT HEAD FINDINGS  There is moderate generalized ventricular enlargement with mild sulcal enlargement. These are stable findings.  There is no mass, hemorrhage, extra-axial fluid collection, or midline shift. There is mild small vessel disease in the centra semiovale bilaterally, stable. There is a small tiny prior lacunar infarct in the superior left cerebellum. There is no new gray-white compartment lesion. No acute infarct.  Bony calvarium appears intact. Mastoid air cells on the left are clear. There is opacification of multiple inferior mastoid air cells on the right, a stable finding.  CT CERVICAL SPINE FINDINGS  There is no fracture. Minimal anterolisthesis of C3 on C4 is felt to be due to spondylosis. There is no other spondylolisthesis. Prevertebral soft tissues and predental space regions are normal. There is marked disc space narrowing at C5-6. There is modest disc space narrowing at C3-4. Other disc spaces appear intact.  There is multilevel facet hypertrophy, most marked at C3-4 on the left, C4-5 bilaterally, C5-6 bilaterally, more on the right than on the left, and C7-T1 on the left. There is exit foraminal narrowing due to bony hypertrophy at C3-4 on the left at C5-6 on the right. The there is no frank disc  extrusion. There is mild stenosis at C5-6 due to bony hypertrophy and diffuse disc bulging.  There is calcification in both carotid arteries. Chronic mastoid disease on the right is noted in the head CT report. Bones do appear somewhat osteoporotic.  IMPRESSION: CT head: Stable atrophy. The ventricles are in proportion larger than sulci. This finding raises concern for a degree of concomitant normal pressure hydrocephalus.  There is no intracranial mass or hemorrhage. There is no acute appearing infarct. There is evidence of a small lacunar infarct in the left cerebellum. There is mild periventricular small vessel disease. There is chronic mastoid disease on the right.  CT cervical spine: Extensive arthropathy. Mild spinal stenosis at C5-6 the, multifactorial. No fracture. Slight anterolisthesis of C3 on C4 is felt to be due to spondylosis. No other spondylolisthesis seen.   Electronically Signed   By: Bretta Bang M.D.   On: 11/20/2012 08:18        Frady Taddeo A. Gerilyn Pilgrim, M.D.  Diplomate, Biomedical engineer of Psychiatry and Neurology ( Neurology). 11/21/2012, 8:41 AM

## 2012-11-21 NOTE — Progress Notes (Signed)
PT Cancellation Note  Patient Details Name: [name] MRN: [id number] DOB: August 12, 1931   Cancelled Treatment:    Reason Eval/Treat Not Completed: Fatigue/lethargy limiting ability to participate;[name] level of consciousness I was unable to do an evaluation..the patient is extremely drowsy and difficult to awaken.  Will try again tomorrow.  Myrlene Broker L 11/21/2012, 2:52 PM

## 2012-11-21 NOTE — Progress Notes (Signed)
[facility] PROGRESS NOTE  [name] JYN:[id number] DOB: [birth year] DOA: 11/20/2012 PCP: Kirk Ruths, MD  Assessment/Plan: Acute encephalopathy. Etiology is not entirely clear at this time. CT of the head is relatively unremarkable for acute findings. Mental status appears to be waxing and waning. MRI of the brain was negative for acute infarct. Neurology has seen the patient with full consult pending. Metabolic workup has been unremarkable. Urinalysis and urine drug screen are unremarkable. We will hold any sedative medications. Blood glucose was also checked and found to be normal. Await further recommendations from neurology.  Unsteady gait with lower extremity weakness. Likely related to encephalopathy and generalized weakness. MRI of the L-spine did not show any acute findings. He does have pain in his back and left leg which appear to be chronic.  Atrial fibrillation. Continue rate control medications. Anticoagulation has been held and will need to be readdressed in the outpatient setting  Hypertension. Stable   Gout. Does not appear to be an acute flare. Continue allopurinol.   Pedal edema. Start the patient on IV Lasix. Urine output has been good. Continue current treatment.   Code Status: full code Family Communication: discussed with patient's brother Disposition Plan: pending hospital course   Consultants:   neurology   Procedures:  none  Antibiotics:  none  HPI/Subjective: More awake today, able to participate in history, but feels weak.  Objective: Filed Vitals:   11/21/12 1430  BP: 103/63  Pulse: 80  Temp: 99.9 F (37.7 C)  Resp: 20    Intake/Output Summary (Last 24 hours) at 11/21/12 1737 Last data filed at 11/21/12 1100  Gross per 24 hour  Intake    240 ml  Output   2000 ml  Net  -1760 ml   Filed Weights   11/20/12 0704 11/20/12 0952  Weight: 102.059 kg (225 lb) 102.059 kg (225 lb)    Exam:   General:  NAD, more awake  today, able to participate more in history.  Cardiovascular: S1, S2, regular rate and rhythm  Respiratory: Clear to auscultation bilaterally  Abdomen:  Soft, nontender, bs+  Musculoskeletal:  No pedal edema bilaterally  Neurologic: Patient is more awake and alert, has occasional episodes of lethargy, strength in upper extremities is 5 out of 5, 4-5 strength in the lower extremities bilaterally, improved from yesterday   Data Reviewed: Basic Metabolic Panel:  Recent Labs Lab 11/20/12 0720 11/21/12 0211  NA 142 141  K 3.6 3.5  CL 107 104  CO2 25 28  GLUCOSE 92 84  BUN 22 18  CREATININE 0.78 0.98  CALCIUM 10.0 9.7   Liver Function Tests:  Recent Labs Lab 11/20/12 0720  AST 20  ALT 29  ALKPHOS 66  BILITOT 0.4  PROT 5.4*  ALBUMIN 2.7*   No results found for this basename: LIPASE, AMYLASE,  in the last 168 hours  Recent Labs Lab 11/20/12 1354  AMMONIA 19   CBC:  Recent Labs Lab 11/20/12 0720 11/21/12 0211  WBC 6.2 6.6  NEUTROABS 5.0  --   HGB 13.9 13.3  HCT 41.8 39.6  MCV 102.7* 102.9*  PLT 103* 109*   Cardiac Enzymes:  Recent Labs Lab 11/20/12 0720 11/20/12 1354 11/20/12 1926 11/21/12 0211  TROPONINI <0.30 <0.30 <0.30 <0.30   BNP (last 3 results)  Recent Labs  05/04/12 2006  PROBNP 353.7   CBG:  Recent Labs Lab 11/20/12 1641 11/20/12 2126 11/21/12 0726 11/21/12 1106 11/21/12 1635  GLUCAP 88 92 85 107* 116*  Recent Results (from the past 240 hour(s))  MRSA PCR SCREENING     Status: Abnormal   Collection Time    11/20/12 10:45 AM      Result Value Range Status   MRSA by PCR INVALID RESULTS, SPECIMEN SENT FOR CULTURE (*) NEGATIVE Final   Comment: RESULT CALLED TO, READ BACK BY AND VERIFIED WITH:     ABBIE,RN AT 1151 ON 11/20/12 BY MOSLEY,J                The GeneXpert MRSA Assay (FDA     approved for NASAL specimens     only), is one component of a     comprehensive MRSA colonization     surveillance program. It is not      intended to diagnose MRSA     infection nor to guide or     monitor treatment for     MRSA infections.  MRSA CULTURE     Status: None   Collection Time    11/20/12 10:45 AM      Result Value Range Status   Specimen Description NOSE   Final   Special Requests NONE   Final   Culture     Final   Value: Culture reincubated for better growth     Performed at [facility]   Report Status PENDING   Incomplete     Studies: Dg Chest 1 View  11/20/2012   CLINICAL DATA:  Pain post trauma  EXAM: CHEST - 1 VIEW  COMPARISON:  Jun 11, 2012  FINDINGS: Lungs are clear. Heart size and pulmonary vascularity are normal. Mild mediastinal prominence compared to the prior study is probably due to supine as opposed to upright positioning. No adenopathy. No bone lesions. No pneumothorax.  IMPRESSION: No edema or consolidation. No pneumothorax. Relative prominence the mediastinum is probably due to supine positioning coupled with great vessel prominence.   Electronically Signed   By: Bretta Bang M.D.   On: 11/20/2012 08:05   Dg Lumbar Spine Complete  11/20/2012   CLINICAL DATA:  Pain post trauma  EXAM: LUMBAR SPINE - COMPLETE 4+ VIEW  COMPARISON:  CT abdomen and pelvis was sagittal reformats November 09, 2011  FINDINGS: Frontal, lateral, spot lumbosacral lateral, and bilateral oblique views were obtained. There are 5 non-rib-bearing lumbar type vertebral bodies. There is levoscoliosis. There is no fracture or spondylolisthesis. There is marked disc space narrowing at L1-2, L2-3, and L3-4. These are stable findings. There is facet osteoarthritic change to varying degrees at all levels. There is atherosclerotic change in the aorta.  IMPRESSION:  There is multilevel osteoarthritic change. Scoliosis. No acute fracture or spondylolisthesis.   Electronically Signed   By: Bretta Bang M.D.   On: 11/20/2012 08:07   Dg Pelvis 1-2 Views  11/20/2012   CLINICAL DATA:  Pain post trauma  EXAM: PELVIS - 1-2  VIEW  COMPARISON:  Jun 11, 2012  FINDINGS: There is a total hip prosthesis on the left which appears well-seated. There is no demonstrable acute fracture or dislocation. There is moderate narrowing of the right hip joint. There are surgical clips overlying the pubic symphysis. Bones are osteoporotic. There are vascular calcifications in the pelvis.  IMPRESSION: No acute fracture or dislocation. Total hip prosthesis on the left. Moderate narrowing right hip joint. Bones are osteoporotic.   Electronically Signed   By: Bretta Bang M.D.   On: 11/20/2012 08:04   Ct Cervical Spine Wo Contrast  11/20/2012   CLINICAL DATA:  Pain and altered mental status post trauma  EXAM: CT HEAD WITHOUT CONTRAST  CT CERVICAL SPINE WITHOUT CONTRAST  TECHNIQUE: Multidetector CT imaging of the head and cervical spine was performed following the standard protocol without intravenous contrast. Multiplanar CT image reconstructions of the cervical spine were also generated.  COMPARISON:  Brain CT January 02, 2012  FINDINGS: CT HEAD FINDINGS  There is moderate generalized ventricular enlargement with mild sulcal enlargement. These are stable findings.  There is no mass, hemorrhage, extra-axial fluid collection, or midline shift. There is mild small vessel disease in the centra semiovale bilaterally, stable. There is a small tiny prior lacunar infarct in the superior left cerebellum. There is no new gray-white compartment lesion. No acute infarct.  Bony calvarium appears intact. Mastoid air cells on the left are clear. There is opacification of multiple inferior mastoid air cells on the right, a stable finding.  CT CERVICAL SPINE FINDINGS  There is no fracture. Minimal anterolisthesis of C3 on C4 is felt to be due to spondylosis. There is no other spondylolisthesis. Prevertebral soft tissues and predental space regions are normal. There is marked disc space narrowing at C5-6. There is modest disc space narrowing at C3-4. Other disc  spaces appear intact.  There is multilevel facet hypertrophy, most marked at C3-4 on the left, C4-5 bilaterally, C5-6 bilaterally, more on the right than on the left, and C7-T1 on the left. There is exit foraminal narrowing due to bony hypertrophy at C3-4 on the left at C5-6 on the right. The there is no frank disc extrusion. There is mild stenosis at C5-6 due to bony hypertrophy and diffuse disc bulging.  There is calcification in both carotid arteries. Chronic mastoid disease on the right is noted in the head CT report. Bones do appear somewhat osteoporotic.  IMPRESSION: CT head: Stable atrophy. The ventricles are in proportion larger than sulci. This finding raises concern for a degree of concomitant normal pressure hydrocephalus.  There is no intracranial mass or hemorrhage. There is no acute appearing infarct. There is evidence of a small lacunar infarct in the left cerebellum. There is mild periventricular small vessel disease. There is chronic mastoid disease on the right.  CT cervical spine: Extensive arthropathy. Mild spinal stenosis at C5-6 the, multifactorial. No fracture. Slight anterolisthesis of C3 on C4 is felt to be due to spondylosis. No other spondylolisthesis seen.   Electronically Signed   By: Bretta Bang M.D.   On: 11/20/2012 08:18   Mr Brain Wo Contrast  11/21/2012   CLINICAL DATA:  Recent falls. Weakness.  EXAM: MRI HEAD WITHOUT CONTRAST  TECHNIQUE: Multiplanar, multisequence MR imaging was performed. No intravenous contrast was administered.  COMPARISON:  11/20/2012 and 01/02/2012 CT. 08/03/2011 MR.  FINDINGS: Exam is motion degraded.  No acute infarct.  No obvious intracranial hemorrhage.  Atrophy with superimposed hydrocephalus suspected. The periventricular white matter type changes may be related to small vessel disease and/or subependymal reabsorption of fluid.  No intracranial mass lesion noted on this unenhanced exam.  Major intracranial vascular structures are patent with  small right vertebral artery.  Opacification inferior right mastoid air cells. This is been noted on prior exams. No obstructing lesion posterior superior nasopharynx.  IMPRESSION: Exam is motion degraded.  No acute infarct.  No obvious intracranial hemorrhage.  Atrophy with superimposed hydrocephalus suspected. The periventricular white matter type changes may be related to small vessel disease and/or subependymal reabsorption of fluid.  Major intracranial vascular structures are patent with small right vertebral artery.  Opacification inferior right mastoid air cells. This is been noted on prior exams.   Electronically Signed   By: Bridgett Larsson M.D.   On: 11/21/2012 12:29   Mr Lumbar Spine Wo Contrast  11/21/2012   CLINICAL DATA:  Leg weakness with unsteady gait following recent fall. History of prostate cancer and anti coagulation therapy.  EXAM: MRI LUMBAR SPINE WITHOUT CONTRAST  TECHNIQUE: Multiplanar, multisequence MR imaging was performed. No intravenous contrast was administered.  COMPARISON:  Radiographs 11/20/2012. Abdominal pelvic CT 11/09/2011.  FINDINGS: Examination is moderately motion degraded. CT demonstrate 5 lumbar-type vertebral bodies. The alignment is stable with a convex left scoliosis. There is no evidence of fracture or pars defect.  The conus medullaris extends to the L2 level and appears normal. No paraspinal abnormalities are identified.  L1-2: There is a central disc extrusion with caudal migration of a disc fragment behind the L2 vertebral body, best seen on the sagittal images. No mass effect on the conus medullaris or significant foraminal compromise results.  L2-3: There is chronic disc degeneration with annular bulging and paraspinal osteophytes, asymmetric to the right. Hyperintensity within the disc space and is likely degenerative. There is no suspicious endplate abnormality. There is mild facet and ligamentous hypertrophy. There is moderate right and mild left foraminal  stenosis.  L3-4: There is chronic disc degeneration with annular bulging and paraspinal osteophytes. There is facet and ligamentous hypertrophy. These factors contribute to fairly symmetric chronic foraminal and lateral recess stenosis bilaterally.  L4-5: Disc height is relatively maintained. There is mild facet and ligamentous hypertrophy. No significant spinal stenosis or nerve root encroachment.  L5-S1: There is a degenerative anterolisthesis with annular disc bulging and moderate bilateral facet hypertrophy. There is a possible extraforaminal disc extrusion on the left, not optimally evaluated due to motion on the axial images. Asymmetric facet hypertrophy is present on the right. There is moderate to severe foraminal narrowing bilaterally, worse on the left. There is probable bilateral L5 nerve root encroachment.  IMPRESSION: 1. Significant left foraminal stenosis at L5-S1 with suspicion of an extraforaminal disc extrusion on the left. There is possible encroachment on either L5 nerve. 2. Central disc extrusion at L1-2 with caudal migration of disc material, but no resulting mass effect on the conus medullaris or the exiting nerve roots. 3. Chronic degenerative disc disease at L2-3 and L3-4 with resulting chronic mild central, lateral recess and foraminal stenosis. 4. No acute findings demonstrated. Please note the study is moderately motion degraded.   Electronically Signed   By: Roxy Horseman M.D.   On: 11/21/2012 12:33    Scheduled Meds: . allopurinol  300 mg Oral Daily  . aspirin EC  81 mg Oral Daily  . atorvastatin  10 mg Oral QHS  . diltiazem  180 mg Oral Daily  . furosemide  20 mg Intravenous BID  . insulin aspart  0-15 Units Subcutaneous TID WC  . insulin aspart  0-5 Units Subcutaneous QHS  . metoprolol  50 mg Oral BID  . sodium chloride  3 mL Intravenous Q12H  . sodium chloride  3 mL Intravenous Q12H   Continuous Infusions:   Active Problems:   Hypertension   Gout   Atrial  fibrillation   Unsteady gait   Acute encephalopathy   Generalized weakness   Chronic anticoagulation   Thrombocytopenia, unspecified    Time spent: 25 mins    MEMON,JEHANZEB  [facility] Pager [phone]. If 7PM-7AM, please contact night-coverage at [email or web address], password [facility] 11/21/2012, 5:37 PM  LOS: 1 day

## 2012-11-22 DIAGNOSIS — I10 Essential (primary) hypertension: Secondary | ICD-10-CM

## 2012-11-22 DIAGNOSIS — R5381 Other malaise: Secondary | ICD-10-CM

## 2012-11-22 LAB — MRSA CULTURE

## 2012-11-22 LAB — GLUCOSE, CAPILLARY: Glucose-Capillary: 123 mg/dL — ABNORMAL HIGH (ref 70–99)

## 2012-11-22 MED ORDER — FUROSEMIDE 20 MG PO TABS: 40.0000 mg | ORAL_TABLET | Freq: Every day | ORAL | Status: DC

## 2012-11-22 MED ORDER — CYANOCOBALAMIN 1000 MCG/ML IJ SOLN
1000.0000 ug | Freq: Once | INTRAMUSCULAR | Status: AC
  Administered 2012-11-22: 1000 ug via INTRAMUSCULAR
  Filled 2012-11-22: qty 1

## 2012-11-22 MED ORDER — FUROSEMIDE 20 MG PO TABS
20.0000 mg | ORAL_TABLET | Freq: Once | ORAL | Status: AC
  Administered 2012-11-22: 20 mg via ORAL
  Filled 2012-11-22: qty 1

## 2012-11-22 NOTE — Evaluation (Signed)
Physical Therapy Evaluation Patient Details Name: [name] MRN: [id number] DOB: 12/03/1931 Today's Date: 11/22/2012 Time: 8756-4332 PT Time Calculation (min): 27 min  PT Assessment / Plan / Recommendation History of Present Illness   Pt is admitted for treatment of progressive lethargy with gait ataxia and falls at home.  He lives with his wife and they have full time CG for both of them.  Pt normally ambulates with a walker and recently has needed SBA with gait.  He has been sleeping almost constantly and when he is awake, wants to go back to sleep.  Clinical Impression   Pt was seen for evaluation.  He was very sleepy but was arousable enough to be able to work with me.  He presents with generalized weakness and gait instability, primarily due to quadriceps weakness.  He was oriented and able to follow all directions, but was focused on wanting to get back to sleep.  Because of gait instability, he would be appropriate for SNF, but he and family would like for him to return home at d/c.  I cautioned pt and his brother that pt should have someone with him any time he is up walking.    PT Assessment  Patient needs continued PT services    Follow Up Recommendations  Home health PT    Does the patient have the potential to tolerate intense rehabilitation      Barriers to Discharge        Equipment Recommendations  None recommended by PT    Recommendations for Other Services     Frequency Min 3X/week    Precautions / Restrictions Precautions Precautions: Fall Restrictions Weight Bearing Restrictions: No   Pertinent Vitals/Pain       Mobility  Bed Mobility Bed Mobility: Supine to Sit Supine to Sit: 5: Supervision;HOB flat;With rails Details for Bed Mobility Assistance: transfer is extremely labored Transfers Transfers: Sit to Stand;Stand to Sit Sit to Stand: 4: Min guard;From bed Stand to Sit: 4: Min guard;To chair/3-in-1;To bed Ambulation/Gait Ambulation/Gait  Assistance: 4: Min guard Ambulation Distance (Feet): 80 Feet Assistive device: Rolling walker Gait Pattern: Decreased step length - right;Right flexed knee in stance;Left flexed knee in stance;Trunk flexed Gait velocity: slow and labored General Gait Details: both knees buckle at times Stairs: No Wheelchair Mobility Wheelchair Mobility: No    Exercises     PT Diagnosis: Difficulty walking;Generalized weakness  PT Problem List: Decreased strength;Decreased activity tolerance;Decreased mobility;Obesity PT Treatment Interventions: Gait training;Functional mobility training;Therapeutic exercise     PT Goals(Current goals can be found in the care plan section) Acute Rehab PT Goals Patient Stated Goal: wants to go home PT Goal Formulation: With patient/family Time For Goal Achievement: 12/06/12 Potential to Achieve Goals: Good  Visit Information  Last PT Received On: 11/22/12       Prior Functioning       Cognition  Cognition Arousal/Alertness: Lethargic Behavior During Therapy: Flat affect Overall Cognitive Status: Within Functional Limits for tasks assessed    Extremity/Trunk Assessment Lower Extremity Assessment Lower Extremity Assessment: Generalized weakness (weakness in quads in more pronounced (3-/5))   Balance Balance Balance Assessed: No  End of Session PT - End of Session Equipment Utilized During Treatment: Gait belt Activity Tolerance: Patient limited by fatigue Patient left: in chair;with family/visitor present (family plans to be with him at all times)  GP     Myrlene Broker L 11/22/2012, 10:05 AM

## 2012-11-22 NOTE — Progress Notes (Signed)
[facility] PROGRESS NOTE  [name] ZOX:[id number] DOB: 05-Jun-1931 DOA: 11/20/2012 PCP: Kirk Ruths, MD  Assessment/Plan: Acute encephalopathy. Etiology is not entirely clear at this time. CT of the head is relatively unremarkable for acute findings. Mental status appears to be waxing and waning. MRI of the brain was negative for acute infarct. Neurology has seen the patient with full consult pending. Metabolic workup has been unremarkable. Urinalysis and urine drug screen are unremarkable. We will hold any sedative medications. Blood glucose was also checked and found to be normal. EEG was done with report pending. Await further recommendations from neurology. Family reports that he is still intermittently somnolent throughout the day. He may benefit from an overnight sleep study as an outpatient  Unsteady gait with lower extremity weakness. Likely related to encephalopathy and generalized weakness. MRI of the L-spine did not show any acute findings. He does have pain in his back and left leg which appear to be chronic.  Atrial fibrillation. Continue rate control medications. Anticoagulation has been held and will need to be readdressed in the outpatient setting.  Would be reluctant to resume anticoagulation in this patient with poor balance who is prone to falls.  Hypertension. Stable   Gout. Does not appear to have an acute flare. Continue allopurinol.   Pedal edema. Patient is on IV Lasix. Urine output has been good. Continue current treatment.   Code Status: full code Family Communication: discussed with patient's brother Disposition Plan: Home with physical therapy   Consultants:   neurology   Procedures:  none  Antibiotics:  none  HPI/Subjective: Mental status appears to be improving. Patient appears to be less lethargic. No new complaints.  Objective: Filed Vitals:   11/22/12 1512  BP: 122/44  Pulse: 89  Temp: 98.6 F (37 C)  Resp: 20     Intake/Output Summary (Last 24 hours) at 11/22/12 1948 Last data filed at 11/22/12 1700  Gross per 24 hour  Intake    480 ml  Output   2250 ml  Net  -1770 ml   Filed Weights   11/20/12 0704 11/20/12 0952  Weight: 102.059 kg (225 lb) 102.059 kg (225 lb)    Exam:   General:  NAD, more awake today, able to participate more in history.  Cardiovascular: S1, S2, regular rate and rhythm  Respiratory: Clear to auscultation bilaterally  Abdomen:  Soft, nontender, bs+  Musculoskeletal:  No pedal edema bilaterally  Neurologic: Patient is more awake and alert, has occasional episodes of lethargy, strength in upper extremities is 5 out of 5, 4-5 strength in the lower extremities bilaterally,   Data Reviewed: Basic Metabolic Panel:  Recent Labs Lab 11/20/12 0720 11/21/12 0211  NA 142 141  K 3.6 3.5  CL 107 104  CO2 25 28  GLUCOSE 92 84  BUN 22 18  CREATININE 0.78 0.98  CALCIUM 10.0 9.7   Liver Function Tests:  Recent Labs Lab 11/20/12 0720  AST 20  ALT 29  ALKPHOS 66  BILITOT 0.4  PROT 5.4*  ALBUMIN 2.7*   No results found for this basename: LIPASE, AMYLASE,  in the last 168 hours  Recent Labs Lab 11/20/12 1354  AMMONIA 19   CBC:  Recent Labs Lab 11/20/12 0720 11/21/12 0211  WBC 6.2 6.6  NEUTROABS 5.0  --   HGB 13.9 13.3  HCT 41.8 39.6  MCV 102.7* 102.9*  PLT 103* 109*   Cardiac Enzymes:  Recent Labs Lab 11/20/12 0720 11/20/12 1354 11/20/12 1926 11/21/12 0211  TROPONINI <0.30 <0.30 <0.30 <0.30   BNP (last 3 results)  Recent Labs  05/04/12 2006  PROBNP 353.7   CBG:  Recent Labs Lab 11/21/12 1635 11/21/12 2155 11/22/12 0814 11/22/12 1203 11/22/12 1646  GLUCAP 116* 167* 125* 123* 123*    Recent Results (from the past 240 hour(s))  MRSA PCR SCREENING     Status: Abnormal   Collection Time    11/20/12 10:45 AM      Result Value Range Status   MRSA by PCR INVALID RESULTS, SPECIMEN SENT FOR CULTURE (*) NEGATIVE Final    Comment: RESULT CALLED TO, READ BACK BY AND VERIFIED WITH:     ABBIE,RN AT 1151 ON 11/20/12 BY MOSLEY,J                The GeneXpert MRSA Assay (FDA     approved for NASAL specimens     only), is one component of a     comprehensive MRSA colonization     surveillance program. It is not     intended to diagnose MRSA     infection nor to guide or     monitor treatment for     MRSA infections.  MRSA CULTURE     Status: None   Collection Time    11/20/12 10:45 AM      Result Value Range Status   Specimen Description NOSE   Final   Special Requests NONE   Final   Culture     Final   Value: NO STAPHYLOCOCCUS AUREUS ISOLATED     Note: NOMRSA     Performed at [facility]   Report Status 11/22/2012 FINAL   Final     Studies: Mr Brain Wo Contrast  11/21/2012   CLINICAL DATA:  Recent falls. Weakness.  EXAM: MRI HEAD WITHOUT CONTRAST  TECHNIQUE: Multiplanar, multisequence MR imaging was performed. No intravenous contrast was administered.  COMPARISON:  11/20/2012 and 01/02/2012 CT. 08/03/2011 MR.  FINDINGS: Exam is motion degraded.  No acute infarct.  No obvious intracranial hemorrhage.  Atrophy with superimposed hydrocephalus suspected. The periventricular white matter type changes may be related to small vessel disease and/or subependymal reabsorption of fluid.  No intracranial mass lesion noted on this unenhanced exam.  Major intracranial vascular structures are patent with small right vertebral artery.  Opacification inferior right mastoid air cells. This is been noted on prior exams. No obstructing lesion posterior superior nasopharynx.  IMPRESSION: Exam is motion degraded.  No acute infarct.  No obvious intracranial hemorrhage.  Atrophy with superimposed hydrocephalus suspected. The periventricular white matter type changes may be related to small vessel disease and/or subependymal reabsorption of fluid.  Major intracranial vascular structures are patent with small right vertebral  artery.  Opacification inferior right mastoid air cells. This is been noted on prior exams.   Electronically Signed   By: Bridgett Larsson M.D.   On: 11/21/2012 12:29   Mr Lumbar Spine Wo Contrast  11/21/2012   CLINICAL DATA:  Leg weakness with unsteady gait following recent fall. History of prostate cancer and anti coagulation therapy.  EXAM: MRI LUMBAR SPINE WITHOUT CONTRAST  TECHNIQUE: Multiplanar, multisequence MR imaging was performed. No intravenous contrast was administered.  COMPARISON:  Radiographs 11/20/2012. Abdominal pelvic CT 11/09/2011.  FINDINGS: Examination is moderately motion degraded. CT demonstrate 5 lumbar-type vertebral bodies. The alignment is stable with a convex left scoliosis. There is no evidence of fracture or pars defect.  The conus medullaris extends to the L2 level and appears normal.  No paraspinal abnormalities are identified.  L1-2: There is a central disc extrusion with caudal migration of a disc fragment behind the L2 vertebral body, best seen on the sagittal images. No mass effect on the conus medullaris or significant foraminal compromise results.  L2-3: There is chronic disc degeneration with annular bulging and paraspinal osteophytes, asymmetric to the right. Hyperintensity within the disc space and is likely degenerative. There is no suspicious endplate abnormality. There is mild facet and ligamentous hypertrophy. There is moderate right and mild left foraminal stenosis.  L3-4: There is chronic disc degeneration with annular bulging and paraspinal osteophytes. There is facet and ligamentous hypertrophy. These factors contribute to fairly symmetric chronic foraminal and lateral recess stenosis bilaterally.  L4-5: Disc height is relatively maintained. There is mild facet and ligamentous hypertrophy. No significant spinal stenosis or nerve root encroachment.  L5-S1: There is a degenerative anterolisthesis with annular disc bulging and moderate bilateral facet hypertrophy. There  is a possible extraforaminal disc extrusion on the left, not optimally evaluated due to motion on the axial images. Asymmetric facet hypertrophy is present on the right. There is moderate to severe foraminal narrowing bilaterally, worse on the left. There is probable bilateral L5 nerve root encroachment.  IMPRESSION: 1. Significant left foraminal stenosis at L5-S1 with suspicion of an extraforaminal disc extrusion on the left. There is possible encroachment on either L5 nerve. 2. Central disc extrusion at L1-2 with caudal migration of disc material, but no resulting mass effect on the conus medullaris or the exiting nerve roots. 3. Chronic degenerative disc disease at L2-3 and L3-4 with resulting chronic mild central, lateral recess and foraminal stenosis. 4. No acute findings demonstrated. Please note the study is moderately motion degraded.   Electronically Signed   By: Roxy Horseman M.D.   On: 11/21/2012 12:33    Scheduled Meds: . allopurinol  300 mg Oral Daily  . aspirin EC  81 mg Oral Daily  . atorvastatin  10 mg Oral QHS  . diltiazem  180 mg Oral Daily  . furosemide  20 mg Intravenous BID  . insulin aspart  0-15 Units Subcutaneous TID WC  . insulin aspart  0-5 Units Subcutaneous QHS  . metoprolol  50 mg Oral BID  . sodium chloride  3 mL Intravenous Q12H  . sodium chloride  3 mL Intravenous Q12H   Continuous Infusions:   Active Problems:   Hypertension   Gout   Atrial fibrillation   Unsteady gait   Acute encephalopathy   Generalized weakness   Chronic anticoagulation   Thrombocytopenia, unspecified    Time spent: 25 mins    Leah Thornberry  [facility] Pager [phone]. If 7PM-7AM, please contact night-coverage at [email or web address], password [facility] 11/22/2012, 7:48 PM  LOS: 2 days

## 2012-11-22 NOTE — Discharge Summary (Signed)
Physician Discharge Summary  [name] ZOX:[id number] DOB: 1931/07/09 DOA: 11/20/2012  PCP: Kirk Ruths, MD  Admit date: 11/20/2012 Discharge date: 11/22/2012  Time spent: 40 minutes  Recommendations for Outpatient Follow-up:  1. Follow up with primary care physician in 1-2 weeks 2. Consider outpatient sleep study  Discharge Diagnoses:  Active Problems:   Hypertension   Gout   Atrial fibrillation   Unsteady gait   Acute encephalopathy   Generalized weakness   Chronic anticoagulation   Thrombocytopenia, unspecified   Discharge Condition: improving  Diet recommendation: low salt, low carb  Filed Weights   11/20/12 0704 11/20/12 0952  Weight: 102.059 kg (225 lb) 102.059 kg (225 lb)    History of present illness:  [name] is a 79 y.o. male Who presents to the emergency room with increasing lethargy weakness. Patient has atrial fibrillation and is on anticoagulation. His brother reports that the patient had a fall approximately one week ago in which she slipped in the bathroom and fell hitting the back of his head. The patient does not have any recollection of these events. Currently, the patient is lethargic and cannot participate in history. His brother reports the patient did have some bleeding from his nose after the fall, but this was self-limited and quickly resolved. After the fall, the patient returned to his usual mental status/stated health. His brother did note that since his fall he intermittently has an unsteady gait. The patient ambulates with the assistance of a walker. He has noticed that over the past week, the patient has required more assistance with walking. He feels that the patient often sometimes will fall over. The patient has 24 hour nursing care at home. This morning his brother was called and was told that the nurse was having a difficult time waking the patient. The patient's brother came to check on him and noted the patient was lethargic,  but was answering questions. The patient was having difficulty walking he was increasingly weak. He was brought to the ER for evaluation where lab work up was essentially unremarkable. CT of the C-spine is also unremarkable. CT of the head indicates enlarged ventricles indicating a possible normal pressure hydrocephalus, but is negative for bleed. The patient has not had any fever, vomiting, diarrhea, cough, shortness of breath, chest pain or any other complaints as of late. He has not been started on any new medications lately. He has been admitted for further workup   Hospital Course:  This patient was admitted to the hospital for an acute encephalopathy.  He was monitored in the hospital and all sedative medications were held.  Work up was unremarkable for any infectious process.  Metabolic work up was also unremarkable. Imaging including MRI of the brain did not reveal any acute infarct.  Patient was seen by neurology and underwent EEG with reports currently pending. It is felt that his AMS is likely due to medication effect since he has been prescribed multiple narcotics as well as muscle relaxants. Through his hospitalization, his mental status has improved and he is now awake and able to converse.  He does often fall back asleep during the day.  Consider outpatient sleep study.  The patient does have atrial fibrillation and is taking pradaxa for anticoagulation. Since he is a high risk for falls and has has numerous falls in the last few weeks, I feel stopping anticoagulation would be the safest option at this time until he is re evaluated by his cardiologist.  Patient did have  significant LE edema.  His lasix was changed to IV and he had good diuresis.  He will be started back on his po doing of lasix on discharge.  Family is very adamant about taking patient home tonight.  I have offered to discharge the patient in the morning, but they wish to take him home tonight.  I have discussed the case  with Dr. Gerilyn Pilgrim who feels that his symptoms were likely related to meds.  His narcotics and muscle relaxants have been discontinued and can be re addressed by his primary care doctor. EEG results can be followed up as an outpatient. Patient will be discharged home today.  Home health services have been set up.  Procedures:  EEG done with report pending  Consultations:  Neurology  Discharge Exam: Filed Vitals:   11/22/12 1512  BP: 122/44  Pulse: 89  Temp: 98.6 F (37 C)  Resp: 20    General: NAD Cardiovascular: s1, s2, rrr Respiratory: cta b  Discharge Instructions  Discharge Orders   Future Orders Complete By Expires   Call MD for:  difficulty breathing, headache or visual disturbances  As directed    Call MD for:  temperature >100.4  As directed    Diet - low sodium heart healthy  As directed    Diet Carb Modified  As directed    Face-to-face encounter (required for Medicare/Medicaid patients)  As directed    Comments:     I Amori Colomb certify that this patient is under my care and that I, or a nurse practitioner or physician's assistant working with me, had a face-to-face encounter that meets the physician face-to-face encounter requirements with this patient on 11/22/2012. The encounter with the patient was in whole, or in part for the following medical condition(s) which is the primary reason for home health care (List medical condition): patient has unsteady gait and would benefit from home health PT, also may not be administering medications correctly so needs home health RN   Questions:     The encounter with the patient was in whole, or in part, for the following medical condition, which is the primary reason for home health care:  encephalopathy due to medications   I certify that, based on my findings, the following services are medically necessary home health services:  Nursing   Physical therapy   My clinical findings support the need for the above services:   Unsafe ambulation due to balance issues   Further, I certify that my clinical findings support that this patient is homebound due to:  Unsafe ambulation due to balance issues   Reason for Medically Necessary Home Health Services:  Therapy- Investment banker, operational, Patent examiner   Home Health  As directed    Questions:     To provide the following care/treatments:  PT   RN   Increase activity slowly  As directed        Medication List    STOP taking these medications       dabigatran 150 MG Caps capsule  Commonly known as:  PRADAXA     HYDROcodone-acetaminophen 5-325 MG per tablet  Commonly known as:  NORCO/VICODIN     oxyCODONE-acetaminophen 5-325 MG per tablet  Commonly known as:  PERCOCET/ROXICET     tiZANidine 4 MG tablet  Commonly known as:  ZANAFLEX      TAKE these medications       allopurinol 300 MG tablet  Commonly known as:  ZYLOPRIM  Take 300 mg  by mouth daily.     ascorbic acid 500 MG tablet  Commonly known as:  VITAMIN C  Take 500 mg by mouth every morning.     aspirin EC 81 MG tablet  Take 81 mg by mouth daily.     calcium-vitamin D 500-200 MG-UNIT per tablet  Commonly known as:  OSCAL WITH D  Take 1 tablet by mouth every morning.     cholecalciferol 1000 UNITS tablet  Commonly known as:  VITAMIN D  Take 1,000 Units by mouth daily.     COD LIVER OIL PO  Take 1 tablet by mouth daily.     diltiazem 180 MG 24 hr capsule  Commonly known as:  CARDIZEM CD  Take 180 mg by mouth daily.     enalapril 5 MG tablet  Commonly known as:  VASOTEC  Take 2.5 mg by mouth daily.     furosemide 20 MG tablet  Commonly known as:  LASIX  Take 2 tablets (40 mg total) by mouth daily.     glipiZIDE 2.5 MG 24 hr tablet  Commonly known as:  GLUCOTROL XL  Take 2.5 mg by mouth daily as needed (blood sugar).     INDOMETHACIN PO  Take 1 tablet by mouth daily as needed (gout).     metoprolol 50 MG tablet  Commonly known as:  LOPRESSOR  Take 50 mg by  mouth 2 (two) times daily.     multivitamin with minerals Tabs tablet  Take 1 tablet by mouth every morning.     predniSONE 10 MG tablet  Commonly known as:  DELTASONE  Take 10 mg by mouth daily as needed (severe gout).     simvastatin 20 MG tablet  Commonly known as:  ZOCOR  Take 20 mg by mouth at bedtime.       Allergies  Allergen Reactions  . Tetanus Toxoids Swelling    Also allergic to "high powered pain medications."       Follow-up Information   Follow up with [facility].   Contact information:   [address] [place] [address] [phone]       Follow up with Kirk Ruths, MD. Schedule an appointment as soon as possible for a visit in 1 week.   Specialty:  Family Medicine   Contact information:   [address] [place] [address] [phone]        The results of significant diagnostics from this hospitalization (including imaging, microbiology, ancillary and laboratory) are listed below for reference.    Significant Diagnostic Studies: Dg Chest 1 View  11/20/2012   CLINICAL DATA:  Pain post trauma  EXAM: CHEST - 1 VIEW  COMPARISON:  Jun 11, 2012  FINDINGS: Lungs are clear. Heart size and pulmonary vascularity are normal. Mild mediastinal prominence compared to the prior study is probably due to supine as opposed to upright positioning. No adenopathy. No bone lesions. No pneumothorax.  IMPRESSION: No edema or consolidation. No pneumothorax. Relative prominence the mediastinum is probably due to supine positioning coupled with great vessel prominence.   Electronically Signed   By: Bretta Bang M.D.   On: 11/20/2012 08:05   Dg Lumbar Spine Complete  11/20/2012   CLINICAL DATA:  Pain post trauma  EXAM: LUMBAR SPINE - COMPLETE 4+ VIEW  COMPARISON:  CT abdomen and pelvis was sagittal reformats November 09, 2011  FINDINGS: Frontal, lateral, spot lumbosacral lateral, and bilateral oblique views were obtained.  There are 5  non-rib-bearing lumbar type vertebral bodies. There is levoscoliosis. There is no fracture or spondylolisthesis. There is marked disc space narrowing at L1-2, L2-3, and L3-4. These are stable findings. There is facet osteoarthritic change to varying degrees at all levels. There is atherosclerotic change in the aorta.  IMPRESSION:  There is multilevel osteoarthritic change. Scoliosis. No acute fracture or spondylolisthesis.   Electronically Signed   By: Bretta Bang M.D.   On: 11/20/2012 08:07   Dg Pelvis 1-2 Views  11/20/2012   CLINICAL DATA:  Pain post trauma  EXAM: PELVIS - 1-2 VIEW  COMPARISON:  Jun 11, 2012  FINDINGS: There is a total hip prosthesis on the left which appears well-seated. There is no demonstrable acute fracture or dislocation. There is moderate narrowing of the right hip joint. There are surgical clips overlying the pubic symphysis. Bones are osteoporotic. There are vascular calcifications in the pelvis.  IMPRESSION: No acute fracture or dislocation. Total hip prosthesis on the left. Moderate narrowing right hip joint. Bones are osteoporotic.   Electronically Signed   By: Bretta Bang M.D.   On: 11/20/2012 08:04   Ct Cervical Spine Wo Contrast  11/20/2012   CLINICAL DATA:  Pain and altered mental status post trauma  EXAM: CT HEAD WITHOUT CONTRAST  CT CERVICAL SPINE WITHOUT CONTRAST  TECHNIQUE: Multidetector CT imaging of the head and cervical spine was performed following the standard protocol without intravenous contrast. Multiplanar CT image reconstructions of the cervical spine were also generated.  COMPARISON:  Brain CT January 02, 2012  FINDINGS: CT HEAD FINDINGS  There is moderate generalized ventricular enlargement with mild sulcal enlargement. These are stable findings.  There is no mass, hemorrhage, extra-axial fluid collection, or midline shift. There is mild small vessel disease in the centra semiovale bilaterally, stable. There is a small tiny prior  lacunar infarct in the superior left cerebellum. There is no new gray-white compartment lesion. No acute infarct.  Bony calvarium appears intact. Mastoid air cells on the left are clear. There is opacification of multiple inferior mastoid air cells on the right, a stable finding.  CT CERVICAL SPINE FINDINGS  There is no fracture. Minimal anterolisthesis of C3 on C4 is felt to be due to spondylosis. There is no other spondylolisthesis. Prevertebral soft tissues and predental space regions are normal. There is marked disc space narrowing at C5-6. There is modest disc space narrowing at C3-4. Other disc spaces appear intact.  There is multilevel facet hypertrophy, most marked at C3-4 on the left, C4-5 bilaterally, C5-6 bilaterally, more on the right than on the left, and C7-T1 on the left. There is exit foraminal narrowing due to bony hypertrophy at C3-4 on the left at C5-6 on the right. The there is no frank disc extrusion. There is mild stenosis at C5-6 due to bony hypertrophy and diffuse disc bulging.  There is calcification in both carotid arteries. Chronic mastoid disease on the right is noted in the head CT report. Bones do appear somewhat osteoporotic.  IMPRESSION: CT head: Stable atrophy. The ventricles are in proportion larger than sulci. This finding raises concern for a degree of concomitant normal pressure hydrocephalus.  There is no intracranial mass or hemorrhage. There is no acute appearing infarct. There is evidence of a small lacunar infarct in the left cerebellum. There is mild periventricular small vessel disease. There is chronic mastoid disease on the right.  CT cervical spine: Extensive arthropathy. Mild spinal stenosis at C5-6 the, multifactorial. No fracture. Slight anterolisthesis of C3 on  C4 is felt to be due to spondylosis. No other spondylolisthesis seen.   Electronically Signed   By: Bretta Bang M.D.   On: 11/20/2012 08:18   Mr Brain Wo Contrast  11/21/2012   CLINICAL DATA:   Recent falls. Weakness.  EXAM: MRI HEAD WITHOUT CONTRAST  TECHNIQUE: Multiplanar, multisequence MR imaging was performed. No intravenous contrast was administered.  COMPARISON:  11/20/2012 and 01/02/2012 CT. 08/03/2011 MR.  FINDINGS: Exam is motion degraded.  No acute infarct.  No obvious intracranial hemorrhage.  Atrophy with superimposed hydrocephalus suspected. The periventricular white matter type changes may be related to small vessel disease and/or subependymal reabsorption of fluid.  No intracranial mass lesion noted on this unenhanced exam.  Major intracranial vascular structures are patent with small right vertebral artery.  Opacification inferior right mastoid air cells. This is been noted on prior exams. No obstructing lesion posterior superior nasopharynx.  IMPRESSION: Exam is motion degraded.  No acute infarct.  No obvious intracranial hemorrhage.  Atrophy with superimposed hydrocephalus suspected. The periventricular white matter type changes may be related to small vessel disease and/or subependymal reabsorption of fluid.  Major intracranial vascular structures are patent with small right vertebral artery.  Opacification inferior right mastoid air cells. This is been noted on prior exams.   Electronically Signed   By: Bridgett Larsson M.D.   On: 11/21/2012 12:29   Mr Lumbar Spine Wo Contrast  11/21/2012   CLINICAL DATA:  Leg weakness with unsteady gait following recent fall. History of prostate cancer and anti coagulation therapy.  EXAM: MRI LUMBAR SPINE WITHOUT CONTRAST  TECHNIQUE: Multiplanar, multisequence MR imaging was performed. No intravenous contrast was administered.  COMPARISON:  Radiographs 11/20/2012. Abdominal pelvic CT 11/09/2011.  FINDINGS: Examination is moderately motion degraded. CT demonstrate 5 lumbar-type vertebral bodies. The alignment is stable with a convex left scoliosis. There is no evidence of fracture or pars defect.  The conus medullaris extends to the L2 level and appears  normal. No paraspinal abnormalities are identified.  L1-2: There is a central disc extrusion with caudal migration of a disc fragment behind the L2 vertebral body, best seen on the sagittal images. No mass effect on the conus medullaris or significant foraminal compromise results.  L2-3: There is chronic disc degeneration with annular bulging and paraspinal osteophytes, asymmetric to the right. Hyperintensity within the disc space and is likely degenerative. There is no suspicious endplate abnormality. There is mild facet and ligamentous hypertrophy. There is moderate right and mild left foraminal stenosis.  L3-4: There is chronic disc degeneration with annular bulging and paraspinal osteophytes. There is facet and ligamentous hypertrophy. These factors contribute to fairly symmetric chronic foraminal and lateral recess stenosis bilaterally.  L4-5: Disc height is relatively maintained. There is mild facet and ligamentous hypertrophy. No significant spinal stenosis or nerve root encroachment.  L5-S1: There is a degenerative anterolisthesis with annular disc bulging and moderate bilateral facet hypertrophy. There is a possible extraforaminal disc extrusion on the left, not optimally evaluated due to motion on the axial images. Asymmetric facet hypertrophy is present on the right. There is moderate to severe foraminal narrowing bilaterally, worse on the left. There is probable bilateral L5 nerve root encroachment.  IMPRESSION: 1. Significant left foraminal stenosis at L5-S1 with suspicion of an extraforaminal disc extrusion on the left. There is possible encroachment on either L5 nerve. 2. Central disc extrusion at L1-2 with caudal migration of disc material, but no resulting mass effect on the conus medullaris or the exiting nerve roots.  3. Chronic degenerative disc disease at L2-3 and L3-4 with resulting chronic mild central, lateral recess and foraminal stenosis. 4. No acute findings demonstrated. Please note the  study is moderately motion degraded.   Electronically Signed   By: Roxy Horseman M.D.   On: 11/21/2012 12:33    Microbiology: Recent Results (from the past 240 hour(s))  MRSA PCR SCREENING     Status: Abnormal   Collection Time    11/20/12 10:45 AM      Result Value Range Status   MRSA by PCR INVALID RESULTS, SPECIMEN SENT FOR CULTURE (*) NEGATIVE Final   Comment: RESULT CALLED TO, READ BACK BY AND VERIFIED WITH:     ABBIE,RN AT 1151 ON 11/20/12 BY MOSLEY,J                The GeneXpert MRSA Assay (FDA     approved for NASAL specimens     only), is one component of a     comprehensive MRSA colonization     surveillance program. It is not     intended to diagnose MRSA     infection nor to guide or     monitor treatment for     MRSA infections.  MRSA CULTURE     Status: None   Collection Time    11/20/12 10:45 AM      Result Value Range Status   Specimen Description NOSE   Final   Special Requests NONE   Final   Culture     Final   Value: NO STAPHYLOCOCCUS AUREUS ISOLATED     Note: NOMRSA     Performed at [facility]   Report Status 11/22/2012 FINAL   Final     Labs: Basic Metabolic Panel:  Recent Labs Lab 11/20/12 0720 11/21/12 0211  NA 142 141  K 3.6 3.5  CL 107 104  CO2 25 28  GLUCOSE 92 84  BUN 22 18  CREATININE 0.78 0.98  CALCIUM 10.0 9.7   Liver Function Tests:  Recent Labs Lab 11/20/12 0720  AST 20  ALT 29  ALKPHOS 66  BILITOT 0.4  PROT 5.4*  ALBUMIN 2.7*   No results found for this basename: LIPASE, AMYLASE,  in the last 168 hours  Recent Labs Lab 11/20/12 1354  AMMONIA 19   CBC:  Recent Labs Lab 11/20/12 0720 11/21/12 0211  WBC 6.2 6.6  NEUTROABS 5.0  --   HGB 13.9 13.3  HCT 41.8 39.6  MCV 102.7* 102.9*  PLT 103* 109*   Cardiac Enzymes:  Recent Labs Lab 11/20/12 0720 11/20/12 1354 11/20/12 1926 11/21/12 0211  TROPONINI <0.30 <0.30 <0.30 <0.30   BNP: BNP (last 3 results)  Recent Labs  05/04/12 2006  PROBNP  353.7   CBG:  Recent Labs Lab 11/21/12 1635 11/21/12 2155 11/22/12 0814 11/22/12 1203 11/22/12 1646  GLUCAP 116* 167* 125* 123* 123*       Signed:  Kenderick Kobler  [facility] 11/22/2012, 10:30 PM

## 2012-11-22 NOTE — Care Management Note (Signed)
    Page 1 of 1   11/22/2012     3:21:49 PM   CARE MANAGEMENT NOTE 11/22/2012  Patient:  [name], [name]   Account Number:  [id number]  Date Initiated:  11/22/2012  Documentation initiated by:  Sharrie Rothman  Subjective/Objective Assessment:   Pt admitted from home with encephalopathy. Pt lives with his wife and will return home at discharge. Pt and wife has 24 hour care between hired help and family. Pt has walker for home use.     Action/Plan:   Home health arranged with [facility]. linda Lothian of [facility] is aware and will collect the pts information from the chart. No DME needs noted. Pt potential discharge 11/22/12. Pt and pts nurse aware of discharge arrangements.   Anticipated DC Date:  11/23/2012   Anticipated DC Plan:  HOME W HOME HEALTH SERVICES      DC Planning Services  CM consult      [facility] Choice  HOME HEALTH   Choice offered to / List presented to:  C-1 Patient        HH arranged  HH-1 RN  HH-2 PT      [facility] agency  [facility]   Status of service:  Completed, signed off Medicare Important Message given?  NA - LOS <3 / Initial given by admissions (If response is "NO", the following Medicare IM given date fields will be blank) Date Medicare IM given:   Date Additional Medicare IM given:    Discharge Disposition:  HOME W HOME HEALTH SERVICES  Per UR Regulation:    If discussed at Long Length of Stay Meetings, dates discussed:    Comments:  11/22/12 1520 Arlyss Queen, RN BSN CM

## 2012-11-22 NOTE — Progress Notes (Signed)
Patient ID: [name], male   DOB: 02/13/1931, 79 y.o.   MRN: [id number]  [facility] NEUROLOGY Odelia Graciano A. Gerilyn Pilgrim, MD     [email or web address]          [name] is an 79 y.o. male.   Assessment/Plan: 1. Acute encephalopathy and gait impairment of unclear etiology. I suspect that the encephalopathy is likely due to medication effect as the workup has been essentially unrevealing. An EEG has been done and results are pending. His vitamin B12 level is on the lower side and this was subsequently be replaced. The patient's gait impairment is likely multifactorial including ischemic white matter changes/atrophy of the brain/medication effect and encephalopathy. The patient's imaging while showed increased ventricle does not appears to show that it is out of proportion to the associated atrophy.  The patient has improved since being hospitalized per the brother. Dr. Kerry Hough does have concerned however that the patient has had some fluctuation in level of consciousness.   The patient today is awake although somewhat drowsy. He thinks that he is at home. This is a little worse than it was on yesterday morning. He is being examined in the late evening however. He notes that his 2014 and that it is October. He does follow commands well and speech is normal. Facial muscle strength is symmetric and chocolate are full. He has good strength in upper and lower extremities.     Objective: Vital signs in last 24 hours: Temp:  [98.6 F (37 C)-99.5 F (37.5 C)] 98.6 F (37 C) (10/28 1512) Pulse Rate:  [89-92] 89 (10/28 1512) Resp:  [20] 20 (10/28 1512) BP: (119-132)/(44-71) 122/44 mmHg (10/28 1512) SpO2:  [93 %-99 %] 93 % (10/28 1512)  Intake/Output from previous day: 10/27 0701 - 10/28 0700 In: 480 [P.O.:480] Out: 1750 [Urine:1750] Intake/Output this shift:   Nutritional status: Carb Control   Lab Results: Results for orders placed during the hospital encounter of 11/20/12 (from the  past 48 hour(s))  GLUCOSE, CAPILLARY     Status: None   Collection Time    11/20/12  9:26 PM      Result Value Range   Glucose-Capillary 92  70 - 99 mg/dL   Comment 1 Notify RN    TROPONIN I     Status: None   Collection Time    11/21/12  2:11 AM      Result Value Range   Troponin I <0.30  <0.30 ng/mL   Comment:            Due to the release kinetics of cTnI,     a negative result within the first hours     of the onset of symptoms does not rule out     myocardial infarction with certainty.     If myocardial infarction is still suspected,     repeat the test at appropriate intervals.  BASIC METABOLIC PANEL     Status: Abnormal   Collection Time    11/21/12  2:11 AM      Result Value Range   Sodium 141  135 - 145 mEq/L   Potassium 3.5  3.5 - 5.1 mEq/L   Chloride 104  96 - 112 mEq/L   CO2 28  19 - 32 mEq/L   Glucose, Bld 84  70 - 99 mg/dL   BUN 18  6 - 23 mg/dL   Creatinine, Ser 4.09  0.50 - 1.35 mg/dL   Calcium 9.7  8.4 - 81.1 mg/dL   GFR  calc non Af Amer 76 (*) >90 mL/min   GFR calc Af Amer 88 (*) >90 mL/min   Comment: (NOTE)     The eGFR has been calculated using the CKD EPI equation.     This calculation has not been validated in all clinical situations.     eGFR's persistently <90 mL/min signify possible Chronic Kidney     Disease.  CBC     Status: Abnormal   Collection Time    11/21/12  2:11 AM      Result Value Range   WBC 6.6  4.0 - 10.5 K/uL   RBC 3.85 (*) 4.22 - 5.81 MIL/uL   Hemoglobin 13.3  13.0 - 17.0 g/dL   HCT 16.1  09.6 - 04.5 %   MCV 102.9 (*) 78.0 - 100.0 fL   MCH 34.5 (*) 26.0 - 34.0 pg   MCHC 33.6  30.0 - 36.0 g/dL   RDW 40.9  81.1 - 91.4 %   Platelets 109 (*) 150 - 400 K/uL   Comment: SPECIMEN CHECKED FOR CLOTS     PLATELET COUNT CONFIRMED BY SMEAR  GLUCOSE, CAPILLARY     Status: None   Collection Time    11/21/12  7:26 AM      Result Value Range   Glucose-Capillary 85  70 - 99 mg/dL   Comment 1 Documented in Chart     Comment 2 Notify RN     RPR     Status: None   Collection Time    11/21/12 10:00 AM      Result Value Range   RPR NON REACTIVE  NON REACTIVE   Comment: Performed at [facility]  HOMOCYSTEINE     Status: None   Collection Time    11/21/12 10:00 AM      Result Value Range   Homocysteine 13.6  4.0 - 15.4 umol/L   Comment: Performed at [facility]  VITAMIN B12     Status: None   Collection Time    11/21/12 10:00 AM      Result Value Range   Vitamin B-12 261  211 - 911 pg/mL   Comment: Performed at [facility]  GLUCOSE, CAPILLARY     Status: Abnormal   Collection Time    11/21/12 11:06 AM      Result Value Range   Glucose-Capillary 107 (*) 70 - 99 mg/dL   Comment 1 Documented in Chart     Comment 2 Notify RN    GLUCOSE, CAPILLARY     Status: Abnormal   Collection Time    11/21/12  4:35 PM      Result Value Range   Glucose-Capillary 116 (*) 70 - 99 mg/dL   Comment 1 Documented in Chart     Comment 2 Notify RN    GLUCOSE, CAPILLARY     Status: Abnormal   Collection Time    11/21/12  9:55 PM      Result Value Range   Glucose-Capillary 167 (*) 70 - 99 mg/dL   Comment 1 Notify RN    GLUCOSE, CAPILLARY     Status: Abnormal   Collection Time    11/22/12  8:14 AM      Result Value Range   Glucose-Capillary 125 (*) 70 - 99 mg/dL   Comment 1 Documented in Chart     Comment 2 Notify RN    GLUCOSE, CAPILLARY     Status: Abnormal   Collection Time    11/22/12 12:03  PM      Result Value Range   Glucose-Capillary 123 (*) 70 - 99 mg/dL   Comment 1 Documented in Chart     Comment 2 Notify RN    GLUCOSE, CAPILLARY     Status: Abnormal   Collection Time    11/22/12  4:46 PM      Result Value Range   Glucose-Capillary 123 (*) 70 - 99 mg/dL   Comment 1 Documented in Chart     Comment 2 Notify RN      Lipid Panel No results found for this basename: CHOL, TRIG, HDL, CHOLHDL, VLDL, LDLCALC,  in the last 72 hours  Studies/Results: Mr Brain Wo Contrast  11/21/2012   CLINICAL  DATA:  Recent falls. Weakness.  EXAM: MRI HEAD WITHOUT CONTRAST  TECHNIQUE: Multiplanar, multisequence MR imaging was performed. No intravenous contrast was administered.  COMPARISON:  11/20/2012 and 01/02/2012 CT. 08/03/2011 MR.  FINDINGS: Exam is motion degraded.  No acute infarct.  No obvious intracranial hemorrhage.  Atrophy with superimposed hydrocephalus suspected. The periventricular white matter type changes may be related to small vessel disease and/or subependymal reabsorption of fluid.  No intracranial mass lesion noted on this unenhanced exam.  Major intracranial vascular structures are patent with small right vertebral artery.  Opacification inferior right mastoid air cells. This is been noted on prior exams. No obstructing lesion posterior superior nasopharynx.  IMPRESSION: Exam is motion degraded.  No acute infarct.  No obvious intracranial hemorrhage.  Atrophy with superimposed hydrocephalus suspected. The periventricular white matter type changes may be related to small vessel disease and/or subependymal reabsorption of fluid.  Major intracranial vascular structures are patent with small right vertebral artery.  Opacification inferior right mastoid air cells. This is been noted on prior exams.   Electronically Signed   By: Bridgett Larsson M.D.   On: 11/21/2012 12:29   Mr Lumbar Spine Wo Contrast  11/21/2012   CLINICAL DATA:  Leg weakness with unsteady gait following recent fall. History of prostate cancer and anti coagulation therapy.  EXAM: MRI LUMBAR SPINE WITHOUT CONTRAST  TECHNIQUE: Multiplanar, multisequence MR imaging was performed. No intravenous contrast was administered.  COMPARISON:  Radiographs 11/20/2012. Abdominal pelvic CT 11/09/2011.  FINDINGS: Examination is moderately motion degraded. CT demonstrate 5 lumbar-type vertebral bodies. The alignment is stable with a convex left scoliosis. There is no evidence of fracture or pars defect.  The conus medullaris extends to the L2 level and  appears normal. No paraspinal abnormalities are identified.  L1-2: There is a central disc extrusion with caudal migration of a disc fragment behind the L2 vertebral body, best seen on the sagittal images. No mass effect on the conus medullaris or significant foraminal compromise results.  L2-3: There is chronic disc degeneration with annular bulging and paraspinal osteophytes, asymmetric to the right. Hyperintensity within the disc space and is likely degenerative. There is no suspicious endplate abnormality. There is mild facet and ligamentous hypertrophy. There is moderate right and mild left foraminal stenosis.  L3-4: There is chronic disc degeneration with annular bulging and paraspinal osteophytes. There is facet and ligamentous hypertrophy. These factors contribute to fairly symmetric chronic foraminal and lateral recess stenosis bilaterally.  L4-5: Disc height is relatively maintained. There is mild facet and ligamentous hypertrophy. No significant spinal stenosis or nerve root encroachment.  L5-S1: There is a degenerative anterolisthesis with annular disc bulging and moderate bilateral facet hypertrophy. There is a possible extraforaminal disc extrusion on the left, not optimally evaluated due to motion on  the axial images. Asymmetric facet hypertrophy is present on the right. There is moderate to severe foraminal narrowing bilaterally, worse on the left. There is probable bilateral L5 nerve root encroachment.  IMPRESSION: 1. Significant left foraminal stenosis at L5-S1 with suspicion of an extraforaminal disc extrusion on the left. There is possible encroachment on either L5 nerve. 2. Central disc extrusion at L1-2 with caudal migration of disc material, but no resulting mass effect on the conus medullaris or the exiting nerve roots. 3. Chronic degenerative disc disease at L2-3 and L3-4 with resulting chronic mild central, lateral recess and foraminal stenosis. 4. No acute findings demonstrated. Please  note the study is moderately motion degraded.   Electronically Signed   By: Roxy Horseman M.D.   On: 11/21/2012 12:33    Medications:  Scheduled Meds: . allopurinol  300 mg Oral Daily  . aspirin EC  81 mg Oral Daily  . atorvastatin  10 mg Oral QHS  . diltiazem  180 mg Oral Daily  . furosemide  20 mg Intravenous BID  . insulin aspart  0-15 Units Subcutaneous TID WC  . insulin aspart  0-5 Units Subcutaneous QHS  . metoprolol  50 mg Oral BID  . sodium chloride  3 mL Intravenous Q12H  . sodium chloride  3 mL Intravenous Q12H   Continuous Infusions:  PRN Meds:.sodium chloride, acetaminophen, acetaminophen, ondansetron (ZOFRAN) IV, ondansetron, sodium chloride     LOS: 2 days   Nell Schrack A. Gerilyn Pilgrim, M.D.  Diplomate, Biomedical engineer of Psychiatry and Neurology ( Neurology).

## 2012-11-23 NOTE — Procedures (Signed)
[facility] Lailynn Southgate A. Gerilyn Pilgrim, MD     [email or web address]        NAMEAHMAN, DUGDALE               ACCOUNT NO.:  [id number]  MEDICAL RECORD NO.:  [id number]  LOCATION:  EE                           FACILITY:  [facility]  PHYSICIAN:  Rei Contee A. Gerilyn Pilgrim, M.D. DATE OF BIRTH:  01-Nov-1931  DATE OF PROCEDURE:  11/21/2012 DATE OF DISCHARGE:  11/21/2012                             EEG INTERPRETATION   INDICATIONS:  A 79 year old man who presents with confusion, altered mental status.  The study is being done to evaluate for seizures.  MEDICATIONS:  Tylenol, allopurinol, aspirin, Lipitor, Cardizem, Lasix, insulin, metoprolol, Zofran.  ANALYSIS:  A 16 channel recording using standard 10/20 measurements is recorded for 21 minutes.  There is a background activity that gets as high as 7 hertz.  A lot of myogenic artifact is seen throughout the recording.  There is also theta, delta slowing seen throughout the recording.  Photic stimulation and hyperventilation were not carried out.  There is no focal or lateralized slowing.  There is no epileptiform activity observed.  IMPRESSION:  Mild generalized slowing indicating a generalized encephalopathy, but no epileptiform activity observed.     Tiana Sivertson A. Gerilyn Pilgrim, M.D.     KAD/MEDQ  D:  11/23/2012  T:  11/23/2012  Job:  [id number]

## 2012-11-27 ENCOUNTER — Inpatient Hospital Stay (HOSPITAL_COMMUNITY)
Admission: EM | Admit: 2012-11-27 | Discharge: 2012-12-02 | DRG: 102 | Disposition: A | Discharge status: Home / Self Care | Payer: Medicare Other | Attending: Internal Medicine | Admitting: Internal Medicine

## 2012-11-27 ENCOUNTER — Emergency Department (HOSPITAL_COMMUNITY): Payer: Medicare Other

## 2012-11-27 ENCOUNTER — Encounter (HOSPITAL_COMMUNITY): Payer: Self-pay | Admitting: Emergency Medicine

## 2012-11-27 DIAGNOSIS — G9340 Encephalopathy, unspecified: Secondary | ICD-10-CM

## 2012-11-27 DIAGNOSIS — F0781 Postconcussional syndrome: Principal | ICD-10-CM | POA: Diagnosis present

## 2012-11-27 DIAGNOSIS — I2510 Atherosclerotic heart disease of native coronary artery without angina pectoris: Secondary | ICD-10-CM

## 2012-11-27 DIAGNOSIS — C61 Malignant neoplasm of prostate: Secondary | ICD-10-CM

## 2012-11-27 DIAGNOSIS — R079 Chest pain, unspecified: Secondary | ICD-10-CM

## 2012-11-27 DIAGNOSIS — IMO0002 Reserved for concepts with insufficient information to code with codable children: Secondary | ICD-10-CM

## 2012-11-27 DIAGNOSIS — R7301 Impaired fasting glucose: Secondary | ICD-10-CM

## 2012-11-27 DIAGNOSIS — I959 Hypotension, unspecified: Secondary | ICD-10-CM | POA: Diagnosis not present

## 2012-11-27 DIAGNOSIS — E785 Hyperlipidemia, unspecified: Secondary | ICD-10-CM

## 2012-11-27 DIAGNOSIS — W19XXXA Unspecified fall, initial encounter: Secondary | ICD-10-CM | POA: Diagnosis present

## 2012-11-27 DIAGNOSIS — R2681 Unsteadiness on feet: Secondary | ICD-10-CM

## 2012-11-27 DIAGNOSIS — Z87891 Personal history of nicotine dependence: Secondary | ICD-10-CM

## 2012-11-27 DIAGNOSIS — I80202 Phlebitis and thrombophlebitis of unspecified deep vessels of left lower extremity: Secondary | ICD-10-CM | POA: Diagnosis present

## 2012-11-27 DIAGNOSIS — I214 Non-ST elevation (NSTEMI) myocardial infarction: Secondary | ICD-10-CM

## 2012-11-27 DIAGNOSIS — R55 Syncope and collapse: Secondary | ICD-10-CM

## 2012-11-27 DIAGNOSIS — N179 Acute kidney failure, unspecified: Secondary | ICD-10-CM

## 2012-11-27 DIAGNOSIS — Z8673 Personal history of transient ischemic attack (TIA), and cerebral infarction without residual deficits: Secondary | ICD-10-CM

## 2012-11-27 DIAGNOSIS — I10 Essential (primary) hypertension: Secondary | ICD-10-CM

## 2012-11-27 DIAGNOSIS — R32 Unspecified urinary incontinence: Secondary | ICD-10-CM | POA: Diagnosis present

## 2012-11-27 DIAGNOSIS — Z86718 Personal history of other venous thrombosis and embolism: Secondary | ICD-10-CM

## 2012-11-27 DIAGNOSIS — Z96649 Presence of unspecified artificial hip joint: Secondary | ICD-10-CM

## 2012-11-27 DIAGNOSIS — E860 Dehydration: Secondary | ICD-10-CM | POA: Diagnosis present

## 2012-11-27 DIAGNOSIS — Z7901 Long term (current) use of anticoagulants: Secondary | ICD-10-CM

## 2012-11-27 DIAGNOSIS — E2740 Unspecified adrenocortical insufficiency: Secondary | ICD-10-CM

## 2012-11-27 DIAGNOSIS — Z8546 Personal history of malignant neoplasm of prostate: Secondary | ICD-10-CM

## 2012-11-27 DIAGNOSIS — R5383 Other fatigue: Secondary | ICD-10-CM

## 2012-11-27 DIAGNOSIS — D696 Thrombocytopenia, unspecified: Secondary | ICD-10-CM

## 2012-11-27 DIAGNOSIS — B356 Tinea cruris: Secondary | ICD-10-CM | POA: Diagnosis present

## 2012-11-27 DIAGNOSIS — I80299 Phlebitis and thrombophlebitis of other deep vessels of unspecified lower extremity: Secondary | ICD-10-CM | POA: Diagnosis present

## 2012-11-27 DIAGNOSIS — R509 Fever, unspecified: Secondary | ICD-10-CM | POA: Diagnosis present

## 2012-11-27 DIAGNOSIS — R531 Weakness: Secondary | ICD-10-CM

## 2012-11-27 DIAGNOSIS — I4891 Unspecified atrial fibrillation: Secondary | ICD-10-CM

## 2012-11-27 DIAGNOSIS — M109 Gout, unspecified: Secondary | ICD-10-CM

## 2012-11-27 LAB — CBC WITH DIFFERENTIAL/PLATELET
Basophils Absolute: 0.1 10*3/uL (ref 0.0–0.1)
Basophils Relative: 1 % (ref 0–1)
Eosinophils Absolute: 0.2 10*3/uL (ref 0.0–0.7)
Eosinophils Relative: 2 % (ref 0–5)
HCT: 37 % — ABNORMAL LOW (ref 39.0–52.0)
Hemoglobin: 12.6 g/dL — ABNORMAL LOW (ref 13.0–17.0)
Lymphocytes Relative: 16 % (ref 12–46)
Lymphs Abs: 1.1 10*3/uL (ref 0.7–4.0)
MCH: 34.9 pg — ABNORMAL HIGH (ref 26.0–34.0)
MCHC: 34.1 g/dL (ref 30.0–36.0)
MCV: 102.5 fL — ABNORMAL HIGH (ref 78.0–100.0)
Monocytes Absolute: 1 10*3/uL (ref 0.1–1.0)
Monocytes Relative: 16 % — ABNORMAL HIGH (ref 3–12)
Neutro Abs: 4.3 10*3/uL (ref 1.7–7.7)
Neutrophils Relative %: 65 % (ref 43–77)
Platelets: 169 10*3/uL (ref 150–400)
RBC: 3.61 MIL/uL — ABNORMAL LOW (ref 4.22–5.81)
RDW: 14.7 % (ref 11.5–15.5)
WBC: 6.6 10*3/uL (ref 4.0–10.5)

## 2012-11-27 LAB — AMMONIA: Ammonia: <10 umol/L — ABNORMAL LOW (ref 11–60)

## 2012-11-27 LAB — COMPREHENSIVE METABOLIC PANEL
ALT: 19 U/L (ref 0–53)
AST: 21 U/L (ref 0–37)
Albumin: 2.9 g/dL — ABNORMAL LOW (ref 3.5–5.2)
Alkaline Phosphatase: 86 U/L (ref 39–117)
BUN: 16 mg/dL (ref 6–23)
CO2: 27 mEq/L (ref 19–32)
Calcium: 10.1 mg/dL (ref 8.4–10.5)
Chloride: 100 mEq/L (ref 96–112)
Creatinine, Ser: 1.17 mg/dL (ref 0.50–1.35)
GFR calc Af Amer: 67 mL/min — ABNORMAL LOW (ref >90)
GFR calc non Af Amer: 57 mL/min — ABNORMAL LOW (ref >90)
Glucose, Bld: 116 mg/dL — ABNORMAL HIGH (ref 70–99)
Potassium: 3.9 mEq/L (ref 3.5–5.1)
Sodium: 138 mEq/L (ref 135–145)
Total Bilirubin: 0.5 mg/dL (ref 0.3–1.2)
Total Protein: 6.2 g/dL (ref 6.0–8.3)

## 2012-11-27 LAB — RAPID URINE DRUG SCREEN, HOSP PERFORMED
Amphetamines: NOT DETECTED
Barbiturates: NOT DETECTED
Benzodiazepines: NOT DETECTED
Cocaine: NOT DETECTED
Opiates: NOT DETECTED
Tetrahydrocannabinol: NOT DETECTED

## 2012-11-27 LAB — POCT I-STAT 3, VENOUS BLOOD GAS (G3P V)
Acid-Base Excess: 2 mmol/L (ref 0.0–2.0)
Bicarbonate: 26.6 mEq/L — ABNORMAL HIGH (ref 20.0–24.0)
O2 Saturation: 42 %
TCO2: 28 mmol/L (ref 0–100)
pCO2, Ven: 40.1 mmHg — ABNORMAL LOW (ref 45.0–50.0)
pH, Ven: 7.43 — ABNORMAL HIGH (ref 7.250–7.300)
pO2, Ven: 23 mmHg — CL (ref 30.0–45.0)

## 2012-11-27 LAB — URINALYSIS, ROUTINE W REFLEX MICROSCOPIC
Bilirubin Urine: NEGATIVE
Glucose, UA: NEGATIVE mg/dL
Hgb urine dipstick: NEGATIVE
Ketones, ur: NEGATIVE mg/dL
Leukocytes, UA: NEGATIVE
Nitrite: NEGATIVE
Protein, ur: NEGATIVE mg/dL
Specific Gravity, Urine: 1.013 (ref 1.005–1.030)
Urobilinogen, UA: 0.2 mg/dL (ref 0.0–1.0)
pH: 6 (ref 5.0–8.0)

## 2012-11-27 LAB — TROPONIN I: Troponin I: <0.3 ng/mL (ref <0.30)

## 2012-11-27 MED ORDER — FENTANYL CITRATE 0.05 MG/ML IJ SOLN
50.0000 ug | Freq: Once | INTRAMUSCULAR | Status: AC
  Administered 2012-11-27: 50 ug via INTRAVENOUS
  Filled 2012-11-27: qty 2

## 2012-11-27 MED ORDER — SODIUM CHLORIDE 0.9 % IV BOLUS (SEPSIS)
1000.0000 mL | Freq: Once | INTRAVENOUS | Status: AC
  Administered 2012-11-27: 1000 mL via INTRAVENOUS

## 2012-11-27 NOTE — ED Notes (Signed)
Dr. Lee at bedside.

## 2012-11-27 NOTE — ED Notes (Signed)
MD at bedside. 

## 2012-11-27 NOTE — ED Notes (Signed)
The pt is here with family because  jhe has been sleeping more than usual for approx one week.  The pt will respond to some questions. He has had a recent admission to [facility]. Breathing rapidly at present

## 2012-11-27 NOTE — ED Provider Notes (Signed)
CSN: [id number]     Arrival date & time 11/27/12  1748 History   First MD Initiated Contact with Patient 11/27/12 1808     Chief Complaint  Patient presents with  . Shortness of Breath   (Consider location/radiation/quality/duration/timing/severity/associated sxs/prior Treatment) Patient is a 79 y.o. male presenting with altered mental status. The history is provided by the patient.  Altered Mental Status Presenting symptoms: lethargy   Severity:  Severe Most recent episode:  Today Episode history:  Multiple Timing:  Constant Progression:  Worsening Context: not alcohol use, not drug use and taking medications as prescribed   Associated symptoms: no difficulty breathing, no fever, no palpitations, no rash and no vomiting      Past Medical History  Diagnosis Date  . Hypertension   . Gout     Acute episode involving ankle in 12/2011  . Hyperlipidemia   . Deep vein thrombophlebitis of left leg 11/2010    LLE; S/P THA  . Fasting hyperglycemia   . Stroke 08/2009; 07/2010    TIA in 07/2010  . H/O heat stroke 08/2009  . Prostate cancer 2013    adenocarcinoma; Gleason grade 8; PSA 9.45; EBRT 2013-14  . Renal cyst   . Syncope     01/2012: Negative pharmacologic stress nuclear  . PAF (paroxysmal atrial fibrillation)     a. first noted 01/2012 during admission for URI;  b. seen again 03/2012 after nstemi of unknown origin - pradaxa initiated.  Marland Kitchen CAD (coronary artery disease)     a. 01/2012 Elev Ti-->nonischemic MV;  b. 03/2012 NSTEMI/Cath: LM 50, LAD 50p/m, 73m/d, LCX nl, RI 60ost sup branch, RCA diff nonobs, EF 55%   Past Surgical History  Procedure Laterality Date  . Hip arthroplasty  12/08/2010    ARTHROPLASTY BIPOLAR HIP;  Surgeon-Olin; Left  . 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 I&d extremity  05/28/2011    Procedure: IRRIGATION AND DEBRIDEMENT EXTREMITY;  Surgeon: Sharma Covert, MD;  Location: [facility] OR;  Service: Orthopedics;  Laterality: Right;  I  & D Right hand/wrist  . Colonoscopy   07/18/2003    RMR: Rectum internal hemorrhoids.  Otherwise, normal rectum/ Left-sided diverticula.  Remainder of colonic mucosa-nl   Family History  Problem Relation Age of Onset  . Prostate cancer Brother   . Colon cancer Sister 29  . Diabetes Mellitus II Father   . Stroke Father    History  Substance Use Topics  . Smoking status: Former Smoker    Types: Cigarettes, Cigars    Quit date: 01/27/1948  . Smokeless tobacco: Current User    Types: Chew     Comment: "quit smoking cigarettes age 21 or 53"  . Alcohol Use: No    Review of Systems  Constitutional: Positive for activity change (slept 20/24 hours yesterday), appetite change (decreased) and fatigue. Negative for fever.  HENT: Negative for facial swelling and voice change.   Eyes: Negative for discharge and itching.  Respiratory: Positive for shortness of breath. Negative for apnea and wheezing.   Cardiovascular: Negative for chest pain, palpitations and leg swelling.  Gastrointestinal: Negative for vomiting.  Genitourinary: Negative for dysuria.  Skin: Negative for rash.  All other systems reviewed and are negative.    Allergies  Tetanus toxoids  Home Medications   Current Outpatient Rx  Name  Route  Sig  Dispense  Refill  . allopurinol (ZYLOPRIM) 300 MG tablet   Oral  Take 300 mg by mouth daily.         Marland Kitchen ascorbic acid (VITAMIN C) 500 MG tablet   Oral   Take 1,000 mg by mouth every morning.          Marland Kitchen aspirin EC 81 MG tablet   Oral   Take 81 mg by mouth daily.         . calcium-vitamin D (OSCAL WITH D) 500-200 MG-UNIT per tablet   Oral   Take 1 tablet by mouth every morning.          . cholecalciferol (VITAMIN D) 1000 UNITS tablet   Oral   Take 1,000 Units by mouth daily.         . COD LIVER OIL PO   Oral   Take 2 tablets by mouth 2 (two) times daily.          . dabigatran (PRADAXA) 150 MG CAPS capsule   Oral   Take 150 mg by mouth daily.          Marland Kitchen diltiazem (CARDIZEM CD) 180 MG 24 hr capsule   Oral   Take 180 mg by mouth daily.         . enalapril (VASOTEC) 5 MG tablet   Oral   Take 2.5 mg by mouth daily.          . furosemide (LASIX) 20 MG tablet   Oral   Take 20 mg by mouth daily.         Marland Kitchen glipiZIDE (GLUCOTROL XL) 2.5 MG 24 hr tablet   Oral   Take 2.5 mg by mouth daily.          . metoprolol (LOPRESSOR) 50 MG tablet   Oral   Take 50 mg by mouth 2 (two) times daily.           . Multiple Vitamin (MULITIVITAMIN WITH MINERALS) TABS   Oral   Take 1 tablet by mouth every morning.          . nitroGLYCERIN (NITROSTAT) 0.4 MG SL tablet   Sublingual   Place 0.4 mg under the tongue every 5 (five) minutes as needed for chest pain.         . predniSONE (DELTASONE) 10 MG tablet   Oral   Take 10 mg by mouth daily as needed (severe gout).         . simvastatin (ZOCOR) 20 MG tablet   Oral   Take 20 mg by mouth at bedtime.           BP 101/52  Pulse 88  Temp(Src) 99.3 F (37.4 C) (Rectal)  Resp 17  SpO2 96% Physical Exam  Nursing note and vitals reviewed. Constitutional: He appears lethargic. He is cooperative. He is easily aroused.  HENT:  Head: Normocephalic and atraumatic.  Right Ear: External ear normal.  Left Ear: External ear normal.  Nose: Nose normal.  Mouth/Throat: Oropharynx is clear and moist and mucous membranes are normal.  Eyes: Conjunctivae and lids are normal.  Neck: Normal range of motion and full passive range of motion without pain.  Cardiovascular: Normal rate, intact distal pulses and normal pulses.   Pulmonary/Chest: Breath sounds normal. Tachypnea noted.  Abdominal: Soft. Normal appearance. There is no tenderness.  Musculoskeletal: Normal range of motion.  Neurological: He is easily aroused. He appears lethargic. He displays no seizure activity. GCS eye subscore is 3. GCS verbal subscore is 4. GCS motor subscore is 6.  Skin: Skin is warm and dry.  ED Course   Procedures (including critical care time) Labs Review Labs Reviewed  CBC WITH DIFFERENTIAL - Abnormal; Notable for the following:    RBC 3.61 (*)    Hemoglobin 12.6 (*)    HCT 37.0 (*)    MCV 102.5 (*)    MCH 34.9 (*)    Monocytes Relative 16 (*)    All other components within normal limits  COMPREHENSIVE METABOLIC PANEL - Abnormal; Notable for the following:    Glucose, Bld 116 (*)    Albumin 2.9 (*)    GFR calc non Af Amer 57 (*)    GFR calc Af Amer 67 (*)    All other components within normal limits  URINALYSIS, ROUTINE W REFLEX MICROSCOPIC - Abnormal; Notable for the following:    APPearance CLOUDY (*)    All other components within normal limits  GLUCOSE, CAPILLARY - Abnormal; Notable for the following:    Glucose-Capillary 109 (*)    All other components within normal limits  AMMONIA - Abnormal; Notable for the following:    Ammonia <10 (*)    All other components within normal limits  POCT I-STAT 3, BLOOD GAS (G3P V) - Abnormal; Notable for the following:    pH, Ven 7.430 (*)    pCO2, Ven 40.1 (*)    pO2, Ven 23.0 (*)    Bicarbonate 26.6 (*)    All other components within normal limits  URINE CULTURE  URINE RAPID DRUG SCREEN (HOSP PERFORMED)  TROPONIN I  LACTIC ACID, PLASMA  PROTIME-INR  BLOOD GAS, VENOUS  URINE RAPID DRUG SCREEN (HOSP PERFORMED)   Imaging Review Ct Head Wo Contrast  11/27/2012   CLINICAL DATA:  Altered mental status.  EXAM: CT HEAD WITHOUT CONTRAST  TECHNIQUE: Contiguous axial images were obtained from the base of the skull through the vertex without intravenous contrast.  COMPARISON:  Head CT 11/20/2012 and 01/02/2012. MRI brain 11/21/2012.  FINDINGS: There is stable atrophy with ventricular dilatation and periventricular low density. No acute intracranial hemorrhage, mass lesion, brain edema or extra-axial fluid collection is demonstrated. There is no evidence of acute infarct. Chronic small vessel ischemic changes are stable.  The visualized  paranasal sinuses and middle ears are clear. There is a persistent mastoid effusion on the right, unchanged. The left mastoid air cells are clear. The calvarium is intact.  IMPRESSION: Stable atrophy, ventriculomegaly and right mastoid effusion. No acute intracranial findings.   Electronically Signed   By: Roxy Horseman M.D.   On: 11/27/2012 20:03   Dg Chest Portable 1 View  11/27/2012   CLINICAL DATA:  Hypertension  EXAM: PORTABLE CHEST - 1 VIEW  COMPARISON:  11/20/2012  FINDINGS: Cardiac shadow is stable. The lungs are clear bilaterally. No acute bony abnormality is noted.  IMPRESSION: No active disease.   Electronically Signed   By: Alcide Clever M.D.   On: 11/27/2012 18:45    EKG Interpretation     Ventricular Rate:  87 PR Interval:  206 QRS Duration: 154 QT Interval:  401 QTC Calculation: 482 R Axis:   -57 Text Interpretation:  Sinus rhythm Atrial premature complex RBBB and LAFB nsst             MDM   1. Lethargy   2. Atrial fibrillation   3. Chronic anticoagulation    79 y.o. male w/ PMHx of multiple medical problems as above presents w/ lethargy, hypotension. At bedside upon arrival to ED. PIV obtained by nurse. IVF given and blood pressure improved. Pt  given oxygen. No obvious infection. Pt afebrile rectally. EKG without acute ischemia. Glucose normal.  VBG without hypercarbia. CXR without PNA, PTX. CMP without renal or liver failure. CBC without leukocytosis or anemia. Ammonia neg. Troponin neg. Lactic acid neg. CT head with no acute bleed or mass. Stable atrophy, ventriculomegaly. Pt stable on re-eval with resolved hypotension. Uncertain diagnosis but feel admission appropriate. Possible normal pressure hydrocephalus. Discussed LP with hospitalist who asked me to defer. Hospitalist to admit.       I independently viewed, interpreted, and used in my medical decision making all ordered lab and imaging tests. Medical Decision Making discussed with ED attending Raeford Razor,  MD      Charm Barges, MD 11/28/12 [phone]

## 2012-11-27 NOTE — ED Notes (Signed)
The pt fell  2 weeks ago and struck his head and he was seen at [facility] and was admitted.  The pt is oriented  And answers questions but keeps his eyes closed

## 2012-11-27 NOTE — ED Notes (Addendum)
Pt sat 88-90%, placed on 2L nasal cannula.  Pt grabbed forehead c/o a headache, grimaced during abdominal palpation.

## 2012-11-27 NOTE — ED Notes (Signed)
Neuro at bedside.

## 2012-11-28 ENCOUNTER — Inpatient Hospital Stay (HOSPITAL_COMMUNITY): Payer: Medicare Other

## 2012-11-28 ENCOUNTER — Encounter (HOSPITAL_COMMUNITY): Payer: Self-pay | Admitting: *Deleted

## 2012-11-28 DIAGNOSIS — R5381 Other malaise: Secondary | ICD-10-CM

## 2012-11-28 DIAGNOSIS — G9340 Encephalopathy, unspecified: Secondary | ICD-10-CM

## 2012-11-28 DIAGNOSIS — R5383 Other fatigue: Secondary | ICD-10-CM | POA: Diagnosis present

## 2012-11-28 LAB — POCT I-STAT 3, VENOUS BLOOD GAS (G3P V)
Acid-Base Excess: 3 mmol/L — ABNORMAL HIGH (ref 0.0–2.0)
Bicarbonate: 26.9 mEq/L — ABNORMAL HIGH (ref 20.0–24.0)
O2 Saturation: 44 %
TCO2: 28 mmol/L (ref 0–100)
pCO2, Ven: 38.5 mmHg — ABNORMAL LOW (ref 45.0–50.0)
pO2, Ven: 23 mmHg — CL (ref 30.0–45.0)

## 2012-11-28 LAB — SEDIMENTATION RATE: Sed Rate: 37 mm/hr — ABNORMAL HIGH (ref 0–16)

## 2012-11-28 MED ORDER — DABIGATRAN ETEXILATE MESYLATE 150 MG PO CAPS
150.0000 mg | ORAL_CAPSULE | Freq: Two times a day (BID) | ORAL | Status: DC
  Administered 2012-11-28 – 2012-12-02 (×9): 150 mg via ORAL
  Filled 2012-11-28 (×10): qty 1

## 2012-11-28 MED ORDER — ACETAMINOPHEN 325 MG PO TABS
650.0000 mg | ORAL_TABLET | ORAL | Status: DC | PRN
  Administered 2012-11-28 – 2012-11-30 (×2): 650 mg via ORAL
  Filled 2012-11-28 (×2): qty 2

## 2012-11-28 MED ORDER — GLIPIZIDE ER 2.5 MG PO TB24
2.5000 mg | ORAL_TABLET | Freq: Every day | ORAL | Status: DC
  Administered 2012-11-28 – 2012-11-29 (×2): 2.5 mg via ORAL
  Filled 2012-11-28 (×4): qty 1

## 2012-11-28 MED ORDER — DILTIAZEM HCL ER COATED BEADS 180 MG PO CP24
180.0000 mg | ORAL_CAPSULE | Freq: Every day | ORAL | Status: DC
  Administered 2012-11-28 – 2012-12-02 (×4): 180 mg via ORAL
  Filled 2012-11-28 (×5): qty 1

## 2012-11-28 MED ORDER — METOPROLOL TARTRATE 25 MG PO TABS
25.0000 mg | ORAL_TABLET | Freq: Two times a day (BID) | ORAL | Status: DC
  Administered 2012-11-28 – 2012-12-02 (×9): 25 mg via ORAL
  Filled 2012-11-28 (×11): qty 1

## 2012-11-28 MED ORDER — ONDANSETRON HCL 4 MG/2ML IJ SOLN
4.0000 mg | Freq: Four times a day (QID) | INTRAMUSCULAR | Status: DC | PRN
  Administered 2012-11-30: 4 mg via INTRAVENOUS
  Filled 2012-11-28: qty 2

## 2012-11-28 MED ORDER — VITAMIN C 500 MG PO TABS
1000.0000 mg | ORAL_TABLET | Freq: Every morning | ORAL | Status: DC
  Administered 2012-11-28 – 2012-12-02 (×5): 1000 mg via ORAL
  Filled 2012-11-28 (×5): qty 2

## 2012-11-28 MED ORDER — MODAFINIL 200 MG PO TABS
200.0000 mg | ORAL_TABLET | Freq: Every day | ORAL | Status: DC
  Administered 2012-11-28: 200 mg via ORAL
  Filled 2012-11-28: qty 1

## 2012-11-28 MED ORDER — DABIGATRAN ETEXILATE MESYLATE 150 MG PO CAPS
150.0000 mg | ORAL_CAPSULE | Freq: Every day | ORAL | Status: DC
  Filled 2012-11-28: qty 1

## 2012-11-28 MED ORDER — ONDANSETRON HCL 4 MG PO TABS: 4.0000 mg | ORAL_TABLET | Freq: Four times a day (QID) | ORAL | Status: DC | PRN

## 2012-11-28 MED ORDER — CHLORHEXIDINE GLUCONATE CLOTH 2 % EX PADS
6.0000 | MEDICATED_PAD | Freq: Every day | CUTANEOUS | Status: DC
  Administered 2012-11-28: 6 via TOPICAL

## 2012-11-28 MED ORDER — SODIUM CHLORIDE 0.9 % IV BOLUS (SEPSIS)
500.0000 mL | Freq: Once | INTRAVENOUS | Status: AC
  Administered 2012-11-29: 500 mL via INTRAVENOUS

## 2012-11-28 MED ORDER — MUPIROCIN 2 % EX OINT
1.0000 "application " | TOPICAL_OINTMENT | Freq: Two times a day (BID) | CUTANEOUS | Status: DC
  Administered 2012-11-28 – 2012-12-02 (×9): 1 via NASAL
  Filled 2012-11-28: qty 22

## 2012-11-28 MED ORDER — ALLOPURINOL 300 MG PO TABS
300.0000 mg | ORAL_TABLET | Freq: Every day | ORAL | Status: DC
  Administered 2012-11-28 – 2012-12-02 (×5): 300 mg via ORAL
  Filled 2012-11-28 (×5): qty 1

## 2012-11-28 MED ORDER — ASPIRIN EC 81 MG PO TBEC
81.0000 mg | DELAYED_RELEASE_TABLET | Freq: Every day | ORAL | Status: DC
  Administered 2012-11-28 – 2012-12-02 (×5): 81 mg via ORAL
  Filled 2012-11-28 (×5): qty 1

## 2012-11-28 MED ORDER — CALCIUM CARBONATE-VITAMIN D 500-200 MG-UNIT PO TABS
1.0000 | ORAL_TABLET | Freq: Every morning | ORAL | Status: DC
  Administered 2012-11-28 – 2012-12-02 (×5): 1 via ORAL
  Filled 2012-11-28 (×5): qty 1

## 2012-11-28 MED ORDER — DEXTROSE-NACL 5-0.9 % IV SOLN
INTRAVENOUS | Status: DC
  Administered 2012-11-28: 02:00:00 via INTRAVENOUS
  Administered 2012-11-29: 100 mL/h via INTRAVENOUS

## 2012-11-28 MED ORDER — SODIUM CHLORIDE 0.9 % IJ SOLN
3.0000 mL | Freq: Two times a day (BID) | INTRAMUSCULAR | Status: DC
  Administered 2012-11-28 – 2012-12-02 (×5): 3 mL via INTRAVENOUS

## 2012-11-28 NOTE — H&P (Signed)
[facility] History and Physical  BRAXEN DOBEK ZOX:[id number] DOB: 1931/03/26    PCP:   Kirk Ruths, MD   Chief Complaint: persistent lethargy, sleepiness, and weakness.  HPI: [name] is an 79 y.o. male with hx of afib, DVT, on Pradaxa, gout, HTN, admitted to [facility] about 2 weeks ago after a fall.  He was having lethargy, increased sleepiness, and generalized weakness.  Work up included slightly low B12 level, negative MRI for acute infarcts, and head CT showed slight increase in ventricular size.  He was seen by Dr Valla Leaver of neurology, who did not think the ventricular size was excessively large for his age.  It was felt at that time that it was medication related events.  Since then, he has been off his pain meds, and has persistent sleepiness.  He has not been able to take care of himself adequately.  He lives at home with his wife who has ESRD on HD.  Family took him to [facility] today with same complaints.  He has no HA, fever, chills, or any other symptomology.  He has not been known to have loud snoring, and was doing better until his fall 2 weeks ago.  Work up today included negative UA, negative CXR, and negative UDS.  Hospitalist was asked to admit him for persistent lethargy and weakness.  Rewiew of Systems:  Constitutional: Negative for malaise, fever and chills. No significant weight loss or weight gain Eyes: Negative for eye pain, redness and discharge, diplopia, visual changes, or flashes of light. ENMT: Negative for ear pain, hoarseness, nasal congestion, sinus pressure and sore throat. No headaches; tinnitus, drooling, or problem swallowing. Cardiovascular: Negative for chest pain, palpitations, diaphoresis, dyspnea and peripheral edema. ; No orthopnea, PND Respiratory: Negative for cough, hemoptysis, wheezing and stridor. No pleuritic chestpain. Gastrointestinal: Negative for nausea, vomiting, diarrhea, constipation, abdominal pain, melena, blood in stool,  hematemesis, jaundice and rectal bleeding.    Genitourinary: Negative for frequency, dysuria, incontinence,flank pain and hematuria; Musculoskeletal: Negative for back pain and neck pain. Negative for swelling and trauma.;  Skin: . Negative for pruritus, rash, abrasions, bruising and skin lesion.; ulcerations Neuro: Negative for headache, lightheadedness and neck stiffness. Negative for weakness, altered level of consciousness , altered mental status, extremity weakness, burning feet, involuntary movement, seizure and syncope.  Psych: negative for anxiety, depression, insomnia, tearfulness, panic attacks, hallucinations, paranoia, suicidal or homicidal ideation    Past Medical History  Diagnosis Date  . Hypertension   . Gout     Acute episode involving ankle in 12/2011  . Hyperlipidemia   . Deep vein thrombophlebitis of left leg 11/2010    LLE; S/P THA  . Fasting hyperglycemia   . Stroke 08/2009; 07/2010    TIA in 07/2010  . H/O heat stroke 08/2009  . Prostate cancer 2013    adenocarcinoma; Gleason grade 8; PSA 9.45; EBRT 2013-14  . Renal cyst   . Syncope     01/2012: Negative pharmacologic stress nuclear  . PAF (paroxysmal atrial fibrillation)     a. first noted 01/2012 during admission for URI;  b. seen again 03/2012 after nstemi of unknown origin - pradaxa initiated.  Marland Kitchen CAD (coronary artery disease)     a. 01/2012 Elev Ti-->nonischemic MV;  b. 03/2012 NSTEMI/Cath: LM 50, LAD 50p/m, 82m/d, LCX nl, RI 60ost sup branch, RCA diff nonobs, EF 55%    Past Surgical History  Procedure Laterality Date  . Hip arthroplasty  12/08/2010    ARTHROPLASTY BIPOLAR HIP;  Surgeon-Olin; Left  . 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 I&d extremity  05/28/2011    Procedure: IRRIGATION AND DEBRIDEMENT EXTREMITY;  Surgeon: Sharma Covert, MD;  Location: [facility] OR;  Service: Orthopedics;  Laterality: Right;  I & D Right hand/wrist  . Colonoscopy   07/18/2003    RMR: Rectum  internal hemorrhoids.  Otherwise, normal rectum/ Left-sided diverticula.  Remainder of colonic mucosa-nl    Medications:  HOME MEDS: Prior to Admission medications   Medication Sig Start Date End Date Taking? Authorizing Provider  allopurinol (ZYLOPRIM) 300 MG tablet Take 300 mg by mouth daily.   Yes Historical Provider, MD  ascorbic acid (VITAMIN C) 500 MG tablet Take 1,000 mg by mouth every morning.    Yes Historical Provider, MD  aspirin EC 81 MG tablet Take 81 mg by mouth daily.   Yes Historical Provider, MD  calcium-vitamin D (OSCAL WITH D) 500-200 MG-UNIT per tablet Take 1 tablet by mouth every morning.    Yes Historical Provider, MD  cholecalciferol (VITAMIN D) 1000 UNITS tablet Take 1,000 Units by mouth daily.   Yes Historical Provider, MD  COD LIVER OIL PO Take 2 tablets by mouth 2 (two) times daily.    Yes Historical Provider, MD  dabigatran (PRADAXA) 150 MG CAPS capsule Take 150 mg by mouth daily.   Yes Historical Provider, MD  diltiazem (CARDIZEM CD) 180 MG 24 hr capsule Take 180 mg by mouth daily.   Yes Historical Provider, MD  enalapril (VASOTEC) 5 MG tablet Take 2.5 mg by mouth daily.    Yes Historical Provider, MD  furosemide (LASIX) 20 MG tablet Take 20 mg by mouth daily.   Yes Historical Provider, MD  glipiZIDE (GLUCOTROL XL) 2.5 MG 24 hr tablet Take 2.5 mg by mouth daily.    Yes Historical Provider, MD  metoprolol (LOPRESSOR) 50 MG tablet Take 50 mg by mouth 2 (two) times daily.     Yes Historical Provider, MD  Multiple Vitamin (MULITIVITAMIN WITH MINERALS) TABS Take 1 tablet by mouth every morning.    Yes Historical Provider, MD  nitroGLYCERIN (NITROSTAT) 0.4 MG SL tablet Place 0.4 mg under the tongue every 5 (five) minutes as needed for chest pain.   Yes Historical Provider, MD  predniSONE (DELTASONE) 10 MG tablet Take 10 mg by mouth daily as needed (severe gout).   Yes Historical Provider, MD  simvastatin (ZOCOR) 20 MG tablet Take 20 mg by mouth at bedtime.    Yes  Historical Provider, MD     Allergies:  Allergies  Allergen Reactions  . Tetanus Toxoids Swelling    Also allergic to "high powered pain medications."    Social History:   reports that he quit smoking about 64 years ago. His smoking use included Cigarettes and Cigars. He smoked 0.00 packs per day. His smokeless tobacco use includes Chew. He reports that he does not drink alcohol or use illicit drugs.  Family History: Family History  Problem Relation Age of Onset  . Prostate cancer Brother   . Colon cancer Sister 66  . Diabetes Mellitus II Father   . Stroke Father      Physical Exam: Filed Vitals:   11/27/12 2330 11/28/12 0000 11/28/12 0030 11/28/12 0045  BP: 106/53 100/48 101/52 109/47  Pulse: 90 87 88 87  Temp:      TempSrc:      Resp: 19 17 17 19   SpO2: 93% 97% 96%  95%   Blood pressure 109/47, pulse 87, temperature 99.3 F (37.4 C), temperature source Rectal, resp. rate 19, SpO2 95.00%.  GEN:  Pleasant patient lying in the stretcher in no acute distress; cooperative with exam. Easily arousable. PSYCH:  alert and oriented x4; does not appear anxious or depressed; affect is appropriate. HEENT: Mucous membranes pink and anicteric; PERRLA; EOM intact; no cervical lymphadenopathy nor thyromegaly or carotid bruit; no JVD; There were no stridor. Neck is very supple. Breasts:: Not examined CHEST WALL: No tenderness CHEST: Normal respiration, clear to auscultation bilaterally.  HEART: Regular rate and rhythm.  There are no murmur, rub, or gallops.   BACK: No kyphosis or scoliosis; no CVA tenderness ABDOMEN: soft and non-tender; no masses, no organomegaly, normal abdominal bowel sounds; no pannus; no intertriginous candida. There is no rebound and no distention. Rectal Exam: Not done EXTREMITIES: No bone or joint deformity; age-appropriate arthropathy of the hands and knees; no edema; no ulcerations.  There is no calf tenderness. Genitalia: not examined PULSES: 2+ and  symmetric SKIN: Normal hydration no rash or ulceration CNS: Cranial nerves 2-12 grossly intact no focal lateralizing neurologic deficit.  Speech is fluent; uvula elevated with phonation, facial symmetry and tongue midline. DTR are normal bilaterally, cerebella exam is intact, barbinski is negative and strengths are equaled bilaterally.  No sensory loss.   Labs on Admission:  Basic Metabolic Panel:  Recent Labs Lab 11/21/12 0211 11/27/12 1758  NA 141 138  K 3.5 3.9  CL 104 100  CO2 28 27  GLUCOSE 84 116*  BUN 18 16  CREATININE 0.98 1.17  CALCIUM 9.7 10.1   Liver Function Tests:  Recent Labs Lab 11/27/12 1758  AST 21  ALT 19  ALKPHOS 86  BILITOT 0.5  PROT 6.2  ALBUMIN 2.9*   No results found for this basename: LIPASE, AMYLASE,  in the last 168 hours  Recent Labs Lab 11/27/12 1823  AMMONIA <10*   CBC:  Recent Labs Lab 11/21/12 0211 11/27/12 1758  WBC 6.6 6.6  NEUTROABS  --  4.3  HGB 13.3 12.6*  HCT 39.6 37.0*  MCV 102.9* 102.5*  PLT 109* 169   Cardiac Enzymes:  Recent Labs Lab 11/21/12 0211 11/27/12 1824  TROPONINI <0.30 <0.30    CBG:  Recent Labs Lab 11/21/12 2155 11/22/12 0814 11/22/12 1203 11/22/12 1646 11/27/12 1754  GLUCAP 167* 125* 123* 123* 109*     Radiological Exams on Admission: Ct Head Wo Contrast  11/27/2012   CLINICAL DATA:  Altered mental status.  EXAM: CT HEAD WITHOUT CONTRAST  TECHNIQUE: Contiguous axial images were obtained from the base of the skull through the vertex without intravenous contrast.  COMPARISON:  Head CT 11/20/2012 and 01/02/2012. MRI brain 11/21/2012.  FINDINGS: There is stable atrophy with ventricular dilatation and periventricular low density. No acute intracranial hemorrhage, mass lesion, brain edema or extra-axial fluid collection is demonstrated. There is no evidence of acute infarct. Chronic small vessel ischemic changes are stable.  The visualized paranasal sinuses and middle ears are clear. There is a  persistent mastoid effusion on the right, unchanged. The left mastoid air cells are clear. The calvarium is intact.  IMPRESSION: Stable atrophy, ventriculomegaly and right mastoid effusion. No acute intracranial findings.   Electronically Signed   By: Roxy Horseman M.D.   On: 11/27/2012 20:03   Dg Chest Portable 1 View  11/27/2012   CLINICAL DATA:  Hypertension  EXAM: PORTABLE CHEST - 1 VIEW  COMPARISON:  11/20/2012  FINDINGS: Cardiac shadow  is stable. The lungs are clear bilaterally. No acute bony abnormality is noted.  IMPRESSION: No active disease.   Electronically Signed   By: Alcide Clever M.D.   On: 11/27/2012 18:45   Assessment/Plan Present on Admission:  . Lethargy . Acute encephalopathy . CAD (coronary artery disease) . Deep vein thrombophlebitis of left leg . Generalized weakness . Hyperlipidemia . Hypertension . Unsteady gait  PLAN:  Persistent lethargy and weakness after a fall 2 weeks ago.  I am not sure why he is having this persistent symptoms.  I think maybe he has post concussive symptoms, but they don't exactly fit the usual picture.  He has been on steroids, and could be steroid induced myopathy.  He has also been on high dose of betablocker, which could cause fatigue.  Will admit him because he cannot be discharged to home.  Will hold his prednisone, and reduce his betablocker.  I will obtain a ESR along with CPK.  He could have sleep apnea, but it would be very unusual to have it diagnosed after a fall 2 weeks ago.  I have consulted neurology for further recommendations.  He will need STR, as his wife cannot adequately take care of him.  In case he was slightly dehydrated, will hold his lasix and give gentle IVF.  When he was in the ER, there was a transient hypotension responded to IVF, so I will hold his vasotec also.  He is stable, full code, and will be admitted to telemetry under [facility] service.  I have continued his Pradaxa.  Thank you for asking me to participate in his  care.  Other plans as per orders.  Code Status: FULL Unk Lightning, MD. [facility] Pager [phone] 7pm to 7am.  11/28/2012, 1:19 AM

## 2012-11-28 NOTE — ED Provider Notes (Signed)
I saw and evaluated the patient, reviewed the resident's note and I agree with the findings and plan.  EKG Interpretation     Ventricular Rate:  87 PR Interval:  206 QRS Duration: 154 QT Interval:  401 QTC Calculation: 482 R Axis:   -57 Text Interpretation:  Sinus rhythm Atrial premature complex RBBB and LAFB nsst             79 year old male with decreased mental status. Symptom onset approximately 2 weeks ago. Recent admission for the same. A fairly extensive workup at that time which is fairly unremarkable. Neuroimaging did note enlarged ventricles though. Patient has had persistent symptoms since being discharged and family is having difficulty taking care of him at home. At his baseline he does ambulate with a walker, but he has been much more off balance and he typically is. Fatigued and sleeping throughout most of the day which is not typical for him. Urinary incontinence. Workup today again fairly unremarkable aside from ventriculomegaly. Consider normal pressure hydrocephalus. Given his degree of drowsiness and his social situation, will admit for further evaluation.  Raeford Razor, MD 11/28/12 0010

## 2012-11-28 NOTE — Procedures (Signed)
History: 79 year old man with persistent lethargy with some episodes of worsening  Background: There is a well defined posterior dominant rhythm of 8 Hz that attenuates with eye opening. The background consists of normal appearing alpha and beta activities with some delta superimposed. There are no clear sleep structures observed.  EEG Abnormalities: 1) generalized regular slow activity  Clinical Interpretation: This EEG is consistent with a mild nonspecific generalized verbal dysfunction(encephalopathy). There was no seizure or seizure predisposition recorded on this study.   Ritta Slot, MD [facility] [phone]  If 7pm- 7am, please page neurology on call at [phone].

## 2012-11-28 NOTE — Progress Notes (Signed)
Subjective: Continues to be hypersomnolent.   He has discrete episodes of worsening of his mental status that the family describes, but these are accompanied by drops in BP and low O2 sats.   The description of the fall includes LOC, and he is amnestic to the fall suggesting that he did indeed have a concussion    Exam: Filed Vitals:   11/28/12 0626  BP: 109/53  Pulse: 78  Temp: 98.2 F (36.8 C)  Resp: 18   Gen: In bed, NAD MS: Lethargic, but once aroused is appropriate, oriented and pleasant ZO:XWRUE, EOMI, face symmetric, VFF Motor: Moves bilatera sides with good strength in the UE Sensory:intact to LT.   Impression: 79 yo  M with hypersomnolence in the setting of recent concussion. I suspect that this represents a post concussive syndrome. I am not certain what causes him to have intermittent episodes of worsening. The drop in blood pressure and oxygen saturation are concerning, I do not have a clear explanation for these. One thought would be that sleep apnea causes him to become intermittently hypoxic, but I would not expect the blood pressure drop as well. Hypoperfusion at a blood pressure of 61/44, though would clearly explain altered mental status that lasts for a brief period of time.  Recommendations: 1) EEG  2) Will try provigil  Ritta Slot, MD [facility] [phone]  If 7pm- 7am, please page neurology on call at [phone].

## 2012-11-28 NOTE — Progress Notes (Signed)
[facility] PROGRESS NOTE  [name] ZOX:[id number] DOB: 07-10-1931 DOA: 11/27/2012 PCP: Kirk Ruths, MD  HPI/Subjective: 79 y.o. Male with a history of HTN, a fib, and a fall 2 weeks ago come in complaining of lethargy, increased sleepiness, and generalized weakness.   He has some pain with movement and is cold this morning.    Assessment/Plan:  Lethargy/Hypersomnolence -Post concussion symptoms vs. Beta blockers vs. Chronic steroid use. Cortisol level ordered and Bmet ordered -CT with some enlarged ventricles which per Neuro normal -Dehydration? IV fluids -Neuro has EEG ordered and will try provigil  Hypotensive episode -BP was down to 61/44 today, now corrected to 109/53  A. Fib Controlled on pradexa  Hypertension -cardiac monitoring -taking cardizem, and lopressor -consider adjusting due to hypotensive episode -EKG abnormal -Consult cardiology    Code Status: full Family Communication: brother at bedside Disposition Plan:inpatient   Consultants:  Cardiology  Neurology  Procedures:  EEG scheduled  Antibiotics:  none     Objective: Filed Vitals:   11/28/12 0626  BP: 109/53  Pulse: 78  Temp: 98.2 F (36.8 C)  Resp: 18    Intake/Output Summary (Last 24 hours) at 11/28/12 1021 Last data filed at 11/28/12 0900  Gross per 24 hour  Intake    240 ml  Output    300 ml  Net    -60 ml   Filed Weights   11/28/12 0157  Weight: 100.2 kg (220 lb 14.4 oz)    Exam:   General:  Lethargic, head down, in some discomfort  Cardiovascular: difficult to assess due to groaning   Respiratory: clear bilaterally  Abdomen: soft, non-tender  Musculoskeletal: moves with touch  Skin: darkening of the skin noted on left arm.  Data Reviewed: Basic Metabolic Panel:  Recent Labs Lab 11/27/12 1758  NA 138  K 3.9  CL 100  CO2 27  GLUCOSE 116*  BUN 16  CREATININE 1.17  CALCIUM 10.1   Liver Function Tests:  Recent Labs Lab  11/27/12 1758  AST 21  ALT 19  ALKPHOS 86  BILITOT 0.5  PROT 6.2  ALBUMIN 2.9*    Recent Labs Lab 11/27/12 1823  AMMONIA <10*   CBC:  Recent Labs Lab 11/27/12 1758  WBC 6.6  NEUTROABS 4.3  HGB 12.6*  HCT 37.0*  MCV 102.5*  PLT 169   Cardiac Enzymes:  Recent Labs Lab 11/27/12 1824 11/28/12 0205  CKTOTAL  --  20  TROPONINI <0.30  --    BNP (last 3 results)  Recent Labs  05/04/12 2006  PROBNP 353.7   CBG:  Recent Labs Lab 11/21/12 2155 11/22/12 0814 11/22/12 1203 11/22/12 1646 11/27/12 1754  GLUCAP 167* 125* 123* 123* 109*    Recent Results (from the past 240 hour(s))  MRSA PCR SCREENING     Status: Abnormal   Collection Time    11/20/12 10:45 AM      Result Value Range Status   MRSA by PCR INVALID RESULTS, SPECIMEN SENT FOR CULTURE (*) NEGATIVE Final   Comment: RESULT CALLED TO, READ BACK BY AND VERIFIED WITH:     ABBIE,RN AT 1151 ON 11/20/12 BY MOSLEY,J                The GeneXpert MRSA Assay (FDA     approved for NASAL specimens     only), is one component of a     comprehensive MRSA colonization     surveillance program. It is not     intended to  diagnose MRSA     infection nor to guide or     monitor treatment for     MRSA infections.  MRSA CULTURE     Status: None   Collection Time    11/20/12 10:45 AM      Result Value Range Status   Specimen Description NOSE   Final   Special Requests NONE   Final   Culture     Final   Value: NO STAPHYLOCOCCUS AUREUS ISOLATED     Note: NOMRSA     Performed at [facility]   Report Status 11/22/2012 FINAL   Final  MRSA PCR SCREENING     Status: Abnormal   Collection Time    11/28/12  1:23 AM      Result Value Range Status   MRSA by PCR POSITIVE (*) NEGATIVE Final   Comment:            The GeneXpert MRSA Assay (FDA     approved for NASAL specimens     only), is one component of a     comprehensive MRSA colonization     surveillance program. It is not     intended to diagnose  MRSA     infection nor to guide or     monitor treatment for     MRSA infections.     RESULT CALLED TO, READ BACK BY AND VERIFIED WITH:     Johnnette Litter RN [id number] GREEN R     Studies: Ct Head Wo Contrast  11/27/2012   CLINICAL DATA:  Altered mental status.  EXAM: CT HEAD WITHOUT CONTRAST  TECHNIQUE: Contiguous axial images were obtained from the base of the skull through the vertex without intravenous contrast.  COMPARISON:  Head CT 11/20/2012 and 01/02/2012. MRI brain 11/21/2012.  FINDINGS: There is stable atrophy with ventricular dilatation and periventricular low density. No acute intracranial hemorrhage, mass lesion, brain edema or extra-axial fluid collection is demonstrated. There is no evidence of acute infarct. Chronic small vessel ischemic changes are stable.  The visualized paranasal sinuses and middle ears are clear. There is a persistent mastoid effusion on the right, unchanged. The left mastoid air cells are clear. The calvarium is intact.  IMPRESSION: Stable atrophy, ventriculomegaly and right mastoid effusion. No acute intracranial findings.   Electronically Signed   By: Roxy Horseman M.D.   On: 11/27/2012 20:03   Dg Chest Portable 1 View  11/27/2012   CLINICAL DATA:  Hypertension  EXAM: PORTABLE CHEST - 1 VIEW  COMPARISON:  11/20/2012  FINDINGS: Cardiac shadow is stable. The lungs are clear bilaterally. No acute bony abnormality is noted.  IMPRESSION: No active disease.   Electronically Signed   By: Alcide Clever M.D.   On: 11/27/2012 18:45    Scheduled Meds: . allopurinol  300 mg Oral Daily  . aspirin EC  81 mg Oral Daily  . calcium-vitamin D  1 tablet Oral q morning - 10a  . Chlorhexidine Gluconate Cloth  6 each Topical Q0600  . dabigatran  150 mg Oral Q12H  . diltiazem  180 mg Oral Daily  . glipiZIDE  2.5 mg Oral QAC breakfast  . metoprolol tartrate  25 mg Oral BID  . modafinil  200 mg Oral Daily  . mupirocin ointment  1 application Nasal BID  . sodium chloride  3 mL  Intravenous Q12H  . ascorbic acid  1,000 mg Oral q morning - 10a   Continuous Infusions: . dextrose 5 % and 0.9%  NaCl 100 mL/hr at 11/28/12 0130    Active Problems:   Hypertension   Hyperlipidemia   Deep vein thrombophlebitis of left leg   CAD (coronary artery disease)   Unsteady gait   Acute encephalopathy   Generalized weakness   Chronic anticoagulation   Lethargy    Time spent: 45    Jari Favre  [facility] Pager [phone] If 7PM-7AM, please contact night-coverage at [email or web address], password [facility] 11/28/2012, 10:21 AM  LOS: 1 day    Addendum  Patient seen and examined, chart and data base reviewed.  I agree with the above assessment and plan.  For full details please see Ms. Jari Favre PA-S note.   Clint Lipps, MD [facility] Pager: [phone] 11/28/2012, 12:40 PM

## 2012-11-28 NOTE — Progress Notes (Signed)
[facility]  Patient Status: Active (receiving services up to time of hospitalization)  [facility] is providing the following services: RN and PT - seen x 1 by nursing on 10/31 before this readmission  If patient discharges after hours, please call [phone].   Jodene Nam 11/28/2012, 3:49 PM

## 2012-11-28 NOTE — Consult Note (Signed)
Reason for Consult: Hypersomnolence for 2 weeks.  HPI:                                                                                                                                          [name] is an 79 y.o. male list of hypertension, hyperlipidemia, DVT and paroxysmal atrial fibrillation on Pradaxa, previous stroke and coronary disease, presenting with a history of progressively increasing difficulty staying awake since he fell in his head about 2 weeks ago. It's unclear whether he actually lost consciousness. He was hospitalized at [facility] last week for similar presentation. Workup was unremarkable. No signs of acute stroke were found. EEG was unremarkable. Brain scan showed diffuse atrophy and enlarged ventricles consistent with the extent of brain atrophy. Patient has had no cognitive decline. He said chronic gait problem which has worsened over the past couple weeks. CT scan today showed atrophy enlarged ventricles but no acute intracranial abnormality. Temperature maximum was 99.3. Laboratory studies were unremarkable including drug screen. Urine reportedly was cloudy but there were no abnormalities. Urine cultures pending. Chest x-ray showed no active disease.  Past Medical History  Diagnosis Date  . Hypertension   . Gout     Acute episode involving ankle in 12/2011  . Hyperlipidemia   . Deep vein thrombophlebitis of left leg 11/2010    LLE; S/P THA  . Fasting hyperglycemia   . Stroke 08/2009; 07/2010    TIA in 07/2010  . H/O heat stroke 08/2009  . Prostate cancer 2013    adenocarcinoma; Gleason grade 8; PSA 9.45; EBRT 2013-14  . Renal cyst   . Syncope     01/2012: Negative pharmacologic stress nuclear  . PAF (paroxysmal atrial fibrillation)     a. first noted 01/2012 during admission for URI;  b. seen again 03/2012 after nstemi of unknown origin - pradaxa initiated.  Marland Kitchen CAD (coronary artery disease)     a. 01/2012 Elev Ti-->nonischemic MV;  b. 03/2012 NSTEMI/Cath: LM 50, LAD 50p/m,  37m/d, LCX nl, RI 60ost sup branch, RCA diff nonobs, EF 55%    Past Surgical History  Procedure Laterality Date  . Hip arthroplasty  12/08/2010    ARTHROPLASTY BIPOLAR HIP;  Surgeon-Olin; Left  . 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 I&d extremity  05/28/2011    Procedure: IRRIGATION AND DEBRIDEMENT EXTREMITY;  Surgeon: Sharma Covert, MD;  Location: [facility] OR;  Service: Orthopedics;  Laterality: Right;  I & D Right hand/wrist  . Colonoscopy   07/18/2003    RMR: Rectum internal hemorrhoids.  Otherwise, normal rectum/ Left-sided diverticula.  Remainder of colonic mucosa-nl    Family History  Problem Relation Age of Onset  . Prostate cancer Brother   . Colon cancer Sister 80  . Diabetes Mellitus II Father   . Stroke Father  Social History:  reports that he quit smoking about 64 years ago. His smoking use included Cigarettes and Cigars. He smoked 0.00 packs per day. His smokeless tobacco use includes Chew. He reports that he does not drink alcohol or use illicit drugs.  Allergies  Allergen Reactions  . Tetanus Toxoids Swelling    Also allergic to "high powered pain medications."    MEDICATIONS:                                                                                                                     I have reviewed the patient's current medications.   ROS:                                                                                                                                       History obtained from sibling and the patient  General ROS: negative for - chills, fatigue, fever, night sweats, weight gain or weight loss Psychological ROS: negative for - behavioral disorder, hallucinations, memory difficulties, mood swings or suicidal ideation Ophthalmic ROS: negative for - blurry vision, double vision, eye pain or loss of vision ENT ROS: negative for - epistaxis, nasal discharge, oral lesions, sore throat, tinnitus or  vertigo Allergy and Immunology ROS: negative for - hives or itchy/watery eyes Hematological and Lymphatic ROS: negative for - bleeding problems, bruising or swollen lymph nodes Endocrine ROS: negative for - galactorrhea, hair pattern changes, polydipsia/polyuria or temperature intolerance Respiratory ROS: negative for - cough, hemoptysis, shortness of breath or wheezing Cardiovascular ROS: negative for - chest pain, dyspnea on exertion, edema or irregular heartbeat Gastrointestinal ROS: negative for - abdominal pain, diarrhea, hematemesis, nausea/vomiting or stool incontinence Genito-Urinary ROS: negative for - dysuria, hematuria, incontinence or urinary frequency/urgency Musculoskeletal ROS: negative for - joint swelling or muscular weakness Neurological ROS: as noted in HPI Dermatological ROS: negative for rash and skin lesion changes   Blood pressure 101/52, pulse 88, temperature 99.3 F (37.4 C), temperature source Rectal, resp. rate 17, SpO2 96.00%.   Neurologic Examination:  Mental Status: Patient was drowsy and tended to fall to sleep rather quickly if not directly engaged. He was well oriented to person, time and place.Marland Kitchen  Speech fluent without evidence of aphasia. Able to follow commands without difficulty. Cranial Nerves: II-Visual fields were normal. III/IV/VI-Pupils were equal and reacted. Extraocular movements were full and conjugate.    V/VII-no facial numbness and no facial weakness. VIII-normal. X-somewhat slurred speech but commensurate with level of alertness. Motor: 5/5 bilaterally with normal tone and bulk Sensory: Normal throughout. Deep Tendon Reflexes: 1+ and symmetric. Plantars: Mute bilaterally Cerebellar: Normal finger-to-nose testing.  Lab Results  Component Value Date/Time   CHOL 175 08/12/2010  5:44 AM    Results for orders placed during the hospital  encounter of 11/27/12 (from the past 48 hour(s))  GLUCOSE, CAPILLARY     Status: Abnormal   Collection Time    11/27/12  5:54 PM      Result Value Range   Glucose-Capillary 109 (*) 70 - 99 mg/dL  CBC WITH DIFFERENTIAL     Status: Abnormal   Collection Time    11/27/12  5:58 PM      Result Value Range   WBC 6.6  4.0 - 10.5 K/uL   RBC 3.61 (*) 4.22 - 5.81 MIL/uL   Hemoglobin 12.6 (*) 13.0 - 17.0 g/dL   HCT 16.1 (*) 09.6 - 04.5 %   MCV 102.5 (*) 78.0 - 100.0 fL   MCH 34.9 (*) 26.0 - 34.0 pg   MCHC 34.1  30.0 - 36.0 g/dL   RDW 40.9  81.1 - 91.4 %   Platelets 169  150 - 400 K/uL   Neutrophils Relative % 65  43 - 77 %   Neutro Abs 4.3  1.7 - 7.7 K/uL   Lymphocytes Relative 16  12 - 46 %   Lymphs Abs 1.1  0.7 - 4.0 K/uL   Monocytes Relative 16 (*) 3 - 12 %   Monocytes Absolute 1.0  0.1 - 1.0 K/uL   Eosinophils Relative 2  0 - 5 %   Eosinophils Absolute 0.2  0.0 - 0.7 K/uL   Basophils Relative 1  0 - 1 %   Basophils Absolute 0.1  0.0 - 0.1 K/uL  COMPREHENSIVE METABOLIC PANEL     Status: Abnormal   Collection Time    11/27/12  5:58 PM      Result Value Range   Sodium 138  135 - 145 mEq/L   Potassium 3.9  3.5 - 5.1 mEq/L   Chloride 100  96 - 112 mEq/L   CO2 27  19 - 32 mEq/L   Glucose, Bld 116 (*) 70 - 99 mg/dL   BUN 16  6 - 23 mg/dL   Creatinine, Ser 7.82  0.50 - 1.35 mg/dL   Calcium 95.6  8.4 - 21.3 mg/dL   Total Protein 6.2  6.0 - 8.3 g/dL   Albumin 2.9 (*) 3.5 - 5.2 g/dL   AST 21  0 - 37 U/L   ALT 19  0 - 53 U/L   Alkaline Phosphatase 86  39 - 117 U/L   Total Bilirubin 0.5  0.3 - 1.2 mg/dL   GFR calc non Af Amer 57 (*) >90 mL/min   GFR calc Af Amer 67 (*) >90 mL/min   Comment: (NOTE)     The eGFR has been calculated using the CKD EPI equation.     This calculation has not been validated in all clinical situations.     eGFR's  persistently <90 mL/min signify possible Chronic Kidney     Disease.  PROTIME-INR     Status: None   Collection Time    11/27/12  5:58 PM       Result Value Range   Prothrombin Time 14.7  11.6 - 15.2 seconds   INR 1.17  0.00 - 1.49  AMMONIA     Status: Abnormal   Collection Time    11/27/12  6:23 PM      Result Value Range   Ammonia <10 (*) 11 - 60 umol/L  TROPONIN I     Status: None   Collection Time    11/27/12  6:24 PM      Result Value Range   Troponin I <0.30  <0.30 ng/mL   Comment:            Due to the release kinetics of cTnI,     a negative result within the first hours     of the onset of symptoms does not rule out     myocardial infarction with certainty.     If myocardial infarction is still suspected,     repeat the test at appropriate intervals.  LACTIC ACID, PLASMA     Status: None   Collection Time    11/27/12  6:44 PM      Result Value Range   Lactic Acid, Venous 1.6  0.5 - 2.2 mmol/L  POCT I-STAT 3, BLOOD GAS (G3P V)     Status: Abnormal   Collection Time    11/27/12  7:23 PM      Result Value Range   pH, Ven 7.430 (*) 7.250 - 7.300   pCO2, Ven 40.1 (*) 45.0 - 50.0 mmHg   pO2, Ven 23.0 (*) 30.0 - 45.0 mmHg   Bicarbonate 26.6 (*) 20.0 - 24.0 mEq/L   TCO2 28  0 - 100 mmol/L   O2 Saturation 42.0     Acid-Base Excess 2.0  0.0 - 2.0 mmol/L   Collection site BRACHIAL ARTERY     Sample type VENOUS     Comment NOTIFIED PHYSICIAN    URINALYSIS, ROUTINE W REFLEX MICROSCOPIC     Status: Abnormal   Collection Time    11/27/12  7:43 PM      Result Value Range   Color, Urine YELLOW  YELLOW   APPearance CLOUDY (*) CLEAR   Specific Gravity, Urine 1.013  1.005 - 1.030   pH 6.0  5.0 - 8.0   Glucose, UA NEGATIVE  NEGATIVE mg/dL   Hgb urine dipstick NEGATIVE  NEGATIVE   Bilirubin Urine NEGATIVE  NEGATIVE   Ketones, ur NEGATIVE  NEGATIVE mg/dL   Protein, ur NEGATIVE  NEGATIVE mg/dL   Urobilinogen, UA 0.2  0.0 - 1.0 mg/dL   Nitrite NEGATIVE  NEGATIVE   Leukocytes, UA NEGATIVE  NEGATIVE   Comment: MICROSCOPIC NOT DONE ON URINES WITH NEGATIVE PROTEIN, BLOOD, LEUKOCYTES, NITRITE, OR GLUCOSE <1000 mg/dL.   URINE RAPID DRUG SCREEN (HOSP PERFORMED)     Status: None   Collection Time    11/27/12  7:43 PM      Result Value Range   Opiates NONE DETECTED  NONE DETECTED   Cocaine NONE DETECTED  NONE DETECTED   Benzodiazepines NONE DETECTED  NONE DETECTED   Amphetamines NONE DETECTED  NONE DETECTED   Tetrahydrocannabinol NONE DETECTED  NONE DETECTED   Barbiturates NONE DETECTED  NONE DETECTED   Comment:            DRUG  SCREEN FOR MEDICAL PURPOSES     ONLY.  IF CONFIRMATION IS NEEDED     FOR ANY PURPOSE, NOTIFY LAB     WITHIN 5 DAYS.                LOWEST DETECTABLE LIMITS     FOR URINE DRUG SCREEN     Drug Class       Cutoff (ng/mL)     Amphetamine      1000     Barbiturate      200     Benzodiazepine   200     Tricyclics       300     Opiates          300     Cocaine          300     THC              50    Ct Head Wo Contrast  11/27/2012   CLINICAL DATA:  Altered mental status.  EXAM: CT HEAD WITHOUT CONTRAST  TECHNIQUE: Contiguous axial images were obtained from the base of the skull through the vertex without intravenous contrast.  COMPARISON:  Head CT 11/20/2012 and 01/02/2012. MRI brain 11/21/2012.  FINDINGS: There is stable atrophy with ventricular dilatation and periventricular low density. No acute intracranial hemorrhage, mass lesion, brain edema or extra-axial fluid collection is demonstrated. There is no evidence of acute infarct. Chronic small vessel ischemic changes are stable.  The visualized paranasal sinuses and middle ears are clear. There is a persistent mastoid effusion on the right, unchanged. The left mastoid air cells are clear. The calvarium is intact.  IMPRESSION: Stable atrophy, ventriculomegaly and right mastoid effusion. No acute intracranial findings.   Electronically Signed   By: Roxy Horseman M.D.   On: 11/27/2012 20:03   Dg Chest Portable 1 View  11/27/2012   CLINICAL DATA:  Hypertension  EXAM: PORTABLE CHEST - 1 VIEW  COMPARISON:  11/20/2012  FINDINGS: Cardiac  shadow is stable. The lungs are clear bilaterally. No acute bony abnormality is noted.  IMPRESSION: No active disease.   Electronically Signed   By: Alcide Clever M.D.   On: 11/27/2012 18:45    Assessment/Plan: Etiology for hypersomnolence is unclear. Patient has no signs of acute intracranial abnormality. Symptoms seem to have been related temporally to closed head injury. Mild concussion cannot be ruled out. Significance of cloudy urine and minimal fever is unclear. There is no significant features of normal pressure hydrocephalus. Patient has no cognitive decline.  Recommendations: 1. No further imaging studies of the brain are indicated at this point 2. [name] cognitive functioning has remained normal. As such, EEG is not likely to be at all fruitful 3. Agree with obtaining urine culture, as detailed may be a contributing factor the patient's excessive sleepiness 4. Continue telemetry and oximetry 5. Patient may need a formal overnight sleep study to rule out sleep apnea if hypersomnolence continues.  We will continue to follow this patient with you.  C.R. Roseanne Reno, MD [facility] [phone]  11/28/2012, 12:37 AM

## 2012-11-28 NOTE — Progress Notes (Signed)
EEG Completed; Results Pending  

## 2012-11-29 LAB — BASIC METABOLIC PANEL
CO2: 23 mEq/L (ref 19–32)
Calcium: 9.4 mg/dL (ref 8.4–10.5)
Creatinine, Ser: 1 mg/dL (ref 0.50–1.35)
Glucose, Bld: 107 mg/dL — ABNORMAL HIGH (ref 70–99)
Potassium: 4.1 mEq/L (ref 3.5–5.1)

## 2012-11-29 LAB — CBC
HCT: 33.2 % — ABNORMAL LOW (ref 39.0–52.0)
MCH: 34.1 pg — ABNORMAL HIGH (ref 26.0–34.0)
MCV: 102.8 fL — ABNORMAL HIGH (ref 78.0–100.0)
Platelets: 174 10*3/uL (ref 150–400)
RDW: 14.3 % (ref 11.5–15.5)

## 2012-11-29 LAB — URINE CULTURE
Colony Count: NO GROWTH
Culture: NO GROWTH

## 2012-11-29 MED ORDER — FLUCONAZOLE 150 MG PO TABS
150.0000 mg | ORAL_TABLET | Freq: Every day | ORAL | Status: DC
  Administered 2012-11-29 – 2012-12-02 (×4): 150 mg via ORAL
  Filled 2012-11-29 (×4): qty 1

## 2012-11-29 MED ORDER — COLCHICINE 0.6 MG PO TABS
0.6000 mg | ORAL_TABLET | Freq: Every day | ORAL | Status: DC
  Administered 2012-11-29 – 2012-12-01 (×3): 0.6 mg via ORAL
  Filled 2012-11-29 (×4): qty 1

## 2012-11-29 MED ORDER — MODAFINIL 200 MG PO TABS: 100.0000 mg | ORAL_TABLET | Freq: Every day | ORAL | Status: DC

## 2012-11-29 MED ORDER — PIPERACILLIN-TAZOBACTAM 3.375 G IVPB
3.3750 g | Freq: Three times a day (TID) | INTRAVENOUS | Status: DC
  Administered 2012-11-29 – 2012-12-01 (×7): 3.375 g via INTRAVENOUS
  Filled 2012-11-29 (×9): qty 50

## 2012-11-29 MED ORDER — VANCOMYCIN HCL IN DEXTROSE 1-5 GM/200ML-% IV SOLN
1000.0000 mg | Freq: Two times a day (BID) | INTRAVENOUS | Status: DC
  Administered 2012-11-29 – 2012-11-30 (×4): 1000 mg via INTRAVENOUS
  Filled 2012-11-29 (×7): qty 200

## 2012-11-29 MED ORDER — MODAFINIL 200 MG PO TABS
100.0000 mg | ORAL_TABLET | Freq: Every day | ORAL | Status: DC
  Administered 2012-11-29: 13:00:00 via ORAL
  Administered 2012-11-30 – 2012-12-02 (×3): 100 mg via ORAL
  Filled 2012-11-29 (×4): qty 1

## 2012-11-29 NOTE — Progress Notes (Signed)
Pt had a period of afib. By the time EKG done, shows sinus rhythm. Pt has history of afib, his rate was controlled in the 80s. Dr, Arthor Captain notified. Pt about to receive am dose of diltizem. Will continue to monitor

## 2012-11-29 NOTE — Progress Notes (Signed)
[facility] was notified that pt had a 102.9 temp axillary hr 108 blood cultures x2 pending and chest xray was negative 650 mg of tylenol po and a 500 ml ns bolus was ordered and given ice packs placed axillary. Pt temp decreased to 99.2 oral. Text page follow up was noted. Will continue to monitor. Ilean Skill LPN

## 2012-11-29 NOTE — Progress Notes (Signed)
Subjective: Patient is "wide open" this morning.   Exam: Filed Vitals:   11/29/12 0452  BP: 103/56  Pulse: 79  Temp: 98.6 F (37 C)  Resp: 18   Gen: In bed, NAD MS: Awake, Alert, mild psychomotor agitation.  ZO:XWRUE, EOMI Motor: 5/5 throughotu Sensory:intact to LT.   EEG was negative  Impression: 79 yo M with post-concussive hypersomnolence. Given the severity, I would favor continuing provigil for a week or two, longer if needed. He seems overstimulated on 200mg , however and I would favor decreasing this to 100mg  Qday. No reason to suspect seizures at this time.  Recommendations: 1) Provigil 100mg  Qday for two weeks, could give some extra to take PRN after that.  2) If persistent problems, could follow up with Dr. Gerilyn Pilgrim whom he saw at [facility].  3) will defer hypotension workup to IM.  4) No further recs at this time, please call with any further questions.   Ritta Slot, MD [facility] [phone]  If 7pm- 7am, please page neurology on call at [phone].

## 2012-11-29 NOTE — Progress Notes (Signed)
[facility] PROGRESS NOTE  [name] ZOX:[id number] DOB: Aug 18, 1931 DOA: 11/27/2012 PCP: Kirk Ruths, MD  HPI/Subjective: 79 y.o. Male with a history of HTN, a fib, and a fall 2 weeks ago come in complaining of lethargy, increased sleepiness, and generalized weakness.   Patient is talking and joking this morning. He complains about his toe but no other pain.   Assessment/Plan:  Lethargy/Hypersomnolence  -Post concussion symptoms vs. Beta blockers vs. Chronic steroid use. Cortisol level normal  -CT with some enlarged ventricles which per Neuro normal  -Continue IV fluids  -EEG normal. Consistent with previous encephalopathy.  -Negative Brudzinski's sign for meningitis.  Fever -With maximum temp of 102.9, denies any cough, any burning while passing urine. -With his lethargy, there is no symptoms or signs suggesting meningitis. -Blood cultures obtained yesterday, started empirically on vancomycin and Zosyn.  Hypotensive episode  -BP was down to 61/44 on 11/3, now corrected to 103/56. -Cortisol within normal limits.  A. Fib  -Rate controlled with Cardizem and Lopressor, patient is in and out of atrial fibrillation. -Is on PRADAXA.  Hypertension  -Cardiac monitoring  -Taking cardizem, and lopressor  -Consider adjusting as necessary   -EKG abnormal  -Consult cardiology  Tinea cruris/Corporis -From infection around his under arms and groin. -Started on oral Diflucan.   Code Status: full Family Communication: brother at bedside. Discussed care.  Disposition Plan: inpatient   Consultants:  Cardiology  Neurology  Procedures:  EEG  Antibiotics:  None   Objective: Filed Vitals:   11/29/12 0452  BP: 103/56  Pulse: 79  Temp: 98.6 F (37 C)  Resp: 18    Intake/Output Summary (Last 24 hours) at 11/29/12 1006 Last data filed at 11/29/12 0700  Gross per 24 hour  Intake   3690 ml  Output   1200 ml  Net   2490 ml   Filed Weights   11/28/12  0157  Weight: 100.2 kg (220 lb 14.4 oz)    Exam:   General:  Alert, oriented to person, place and time.   Cardiovascular:   Respiratory:   Abdomen: soft, non-tender  Musculoskeletal: good muscle tone, moving all limbs.   Data Reviewed: Basic Metabolic Panel:  Recent Labs Lab 11/27/12 1758 11/29/12 0645  NA 138 142  K 3.9 4.1  CL 100 110  CO2 27 23  GLUCOSE 116* 107*  BUN 16 11  CREATININE 1.17 1.00  CALCIUM 10.1 9.4   Liver Function Tests:  Recent Labs Lab 11/27/12 1758  AST 21  ALT 19  ALKPHOS 86  BILITOT 0.5  PROT 6.2  ALBUMIN 2.9*   Recent Labs Lab 11/27/12 1823  AMMONIA <10*   CBC:  Recent Labs Lab 11/27/12 1758 11/29/12 0645  WBC 6.6 7.1  NEUTROABS 4.3  --   HGB 12.6* 11.0*  HCT 37.0* 33.2*  MCV 102.5* 102.8*  PLT 169 174   Cardiac Enzymes:  Recent Labs Lab 11/27/12 1824 11/28/12 0205  CKTOTAL  --  20  TROPONINI <0.30  --    BNP (last 3 results)  Recent Labs  05/04/12 2006  PROBNP 353.7   CBG:  Recent Labs Lab 11/22/12 1203 11/22/12 1646 11/27/12 1754 11/28/12 2217  GLUCAP 123* 123* 109* 135*    Recent Results (from the past 240 hour(s))  MRSA PCR SCREENING     Status: Abnormal   Collection Time    11/20/12 10:45 AM      Result Value Range Status   MRSA by PCR INVALID RESULTS, SPECIMEN SENT FOR  CULTURE (*) NEGATIVE Final   Comment: RESULT CALLED TO, READ BACK BY AND VERIFIED WITH:     ABBIE,RN AT 1151 ON 11/20/12 BY MOSLEY,J                The GeneXpert MRSA Assay (FDA     approved for NASAL specimens     only), is one component of a     comprehensive MRSA colonization     surveillance program. It is not     intended to diagnose MRSA     infection nor to guide or     monitor treatment for     MRSA infections.  MRSA CULTURE     Status: None   Collection Time    11/20/12 10:45 AM      Result Value Range Status   Specimen Description NOSE   Final   Special Requests NONE   Final   Culture     Final    Value: NO STAPHYLOCOCCUS AUREUS ISOLATED     Note: NOMRSA     Performed at [facility]   Report Status 11/22/2012 FINAL   Final  URINE CULTURE     Status: None   Collection Time    11/27/12  7:43 PM      Result Value Range Status   Specimen Description URINE, RANDOM   Final   Special Requests NONE   Final   Culture  Setup Time     Final   Value: 11/28/2012 01:03     Performed at [facility] Count     Final   Value: NO GROWTH     Performed at [facility]   Culture     Final   Value: NO GROWTH     Performed at [facility]   Report Status 11/29/2012 FINAL   Final  MRSA PCR SCREENING     Status: Abnormal   Collection Time    11/28/12  1:23 AM      Result Value Range Status   MRSA by PCR POSITIVE (*) NEGATIVE Final   Comment:            The GeneXpert MRSA Assay (FDA     approved for NASAL specimens     only), is one component of a     comprehensive MRSA colonization     surveillance program. It is not     intended to diagnose MRSA     infection nor to guide or     monitor treatment for     MRSA infections.     RESULT CALLED TO, READ BACK BY AND VERIFIED WITH:     Johnnette Litter RN [phone] [id number] GREEN R  CULTURE, BLOOD (ROUTINE X 2)     Status: None   Collection Time    11/28/12  2:20 PM      Result Value Range Status   Specimen Description BLOOD LEFT ARM   Final   Special Requests     Final   Value: BOTTLES DRAWN AEROBIC AND ANAEROBIC 10CC AER,8CC ANA   Culture  Setup Time     Final   Value: 11/28/2012 22:46     Performed at [facility]   Culture     Final   Value:        BLOOD CULTURE RECEIVED NO GROWTH TO DATE CULTURE WILL BE HELD FOR 5 DAYS BEFORE ISSUING A FINAL NEGATIVE REPORT     Performed at [facility]  Report Status PENDING   Incomplete  CULTURE, BLOOD (ROUTINE X 2)     Status: None   Collection Time    11/28/12  4:28 PM      Result Value Range Status   Specimen Description BLOOD LEFT ARM   Final    Special Requests     Final   Value: BOTTLES DRAWN AEROBIC AND ANAEROBIC 10CC AER,5CC ANA   Culture  Setup Time     Final   Value: 11/28/2012 22:46     Performed at [facility]   Culture     Final   Value:        BLOOD CULTURE RECEIVED NO GROWTH TO DATE CULTURE WILL BE HELD FOR 5 DAYS BEFORE ISSUING A FINAL NEGATIVE REPORT     Performed at [facility]   Report Status PENDING   Incomplete     Studies: Ct Head Wo Contrast  11/27/2012   CLINICAL DATA:  Altered mental status.  EXAM: CT HEAD WITHOUT CONTRAST  TECHNIQUE: Contiguous axial images were obtained from the base of the skull through the vertex without intravenous contrast.  COMPARISON:  Head CT 11/20/2012 and 01/02/2012. MRI brain 11/21/2012.  FINDINGS: There is stable atrophy with ventricular dilatation and periventricular low density. No acute intracranial hemorrhage, mass lesion, brain edema or extra-axial fluid collection is demonstrated. There is no evidence of acute infarct. Chronic small vessel ischemic changes are stable.  The visualized paranasal sinuses and middle ears are clear. There is a persistent mastoid effusion on the right, unchanged. The left mastoid air cells are clear. The calvarium is intact.  IMPRESSION: Stable atrophy, ventriculomegaly and right mastoid effusion. No acute intracranial findings.   Electronically Signed   By: Roxy Horseman M.D.   On: 11/27/2012 20:03   Dg Chest Portable 1 View  11/27/2012   CLINICAL DATA:  Hypertension  EXAM: PORTABLE CHEST - 1 VIEW  COMPARISON:  11/20/2012  FINDINGS: Cardiac shadow is stable. The lungs are clear bilaterally. No acute bony abnormality is noted.  IMPRESSION: No active disease.   Electronically Signed   By: Alcide Clever M.D.   On: 11/27/2012 18:45    Scheduled Meds: . allopurinol  300 mg Oral Daily  . aspirin EC  81 mg Oral Daily  . calcium-vitamin D  1 tablet Oral q morning - 10a  . Chlorhexidine Gluconate Cloth  6 each Topical Q0600  . dabigatran   150 mg Oral Q12H  . diltiazem  180 mg Oral Daily  . glipiZIDE  2.5 mg Oral QAC breakfast  . metoprolol tartrate  25 mg Oral BID  . modafinil  200 mg Oral Daily  . mupirocin ointment  1 application Nasal BID  . piperacillin-tazobactam (ZOSYN)  IV  3.375 g Intravenous Q8H  . sodium chloride  3 mL Intravenous Q12H  . vancomycin  1,000 mg Intravenous Q12H  . ascorbic acid  1,000 mg Oral q morning - 10a   Continuous Infusions: . dextrose 5 % and 0.9% NaCl 100 mL/hr (11/29/12 0120)    Active Problems:   Hypertension   Hyperlipidemia   Deep vein thrombophlebitis of left leg   CAD (coronary artery disease)   Unsteady gait   Acute encephalopathy   Generalized weakness   Chronic anticoagulation   Lethargy    Time spent: 49    Jari Favre PA-S  [facility] Pager [phone] If 7PM-7AM, please contact night-coverage at [email or web address], password [facility] 11/29/2012, 10:06 AM  LOS: 2 days  Addendum  Patient seen and examined, chart and data base reviewed.  I agree with the above assessment and plan.  For full details please see Mrs. Jari Favre PA-S note.   Clint Lipps, MD [facility] Pager: [phone] 11/29/2012, 11:10 AM

## 2012-11-29 NOTE — Progress Notes (Signed)
Pt began hallucinating, talking about pigs in the room. This was a clear change from how he has been today. I checked his vital signs, Afib 87, resp 26, temp 99.3, bp 126/84. Initial sats were 78% on 2l [place] but gradually came up to 94% on 4l . I question how accurate the initial sat reading was. I notified Dr. Arthor Captain. Pt has had hallucinations in the past. Will continue to monitor and recheck temp again shortly to make sure it isn't increasing

## 2012-11-29 NOTE — Progress Notes (Signed)
ANTIBIOTIC CONSULT NOTE - INITIAL  Pharmacy Consult for Vancomycin/Zosyn Indication: Fever, r/o sepsis  Allergies  Allergen Reactions  . Tetanus Toxoids Swelling    Also allergic to "high powered pain medications."    Patient Measurements: Height: 5\' 8"  (172.7 cm) Weight: 220 lb 14.4 oz (100.2 kg) IBW/kg (Calculated) : 68.4  Vital Signs: Temp: 98.6 F (37 C) (11/04 0452) Temp src: Oral (11/04 0452) BP: 103/56 mmHg (11/04 0452) Pulse Rate: 79 (11/04 0452) Intake/Output from previous day: 11/03 0701 - 11/04 0700 In: 3930 [P.O.:480; I.V.:2950; IV Piggyback:500] Out: 1500 [Urine:1500] Intake/Output from this shift:    Labs:  Recent Labs  11/27/12 1758 11/29/12 0645  WBC 6.6 7.1  HGB 12.6* 11.0*  PLT 169 174  CREATININE 1.17  --    Estimated Creatinine Clearance: 58.7 ml/min (by C-G formula based on Cr of 1.17). No results found for this basename: VANCOTROUGH, Leodis Binet, VANCORANDOM, GENTTROUGH, GENTPEAK, GENTRANDOM, TOBRATROUGH, TOBRAPEAK, TOBRARND, AMIKACINPEAK, AMIKACINTROU, AMIKACIN,  in the last 72 hours   Microbiology: Recent Results (from the past 720 hour(s))  MRSA PCR SCREENING     Status: Abnormal   Collection Time    11/20/12 10:45 AM      Result Value Range Status   MRSA by PCR INVALID RESULTS, SPECIMEN SENT FOR CULTURE (*) NEGATIVE Final   Comment: RESULT CALLED TO, READ BACK BY AND VERIFIED WITH:     ABBIE,RN AT 1151 ON 11/20/12 BY MOSLEY,J                The GeneXpert MRSA Assay (FDA     approved for NASAL specimens     only), is one component of a     comprehensive MRSA colonization     surveillance program. It is not     intended to diagnose MRSA     infection nor to guide or     monitor treatment for     MRSA infections.  MRSA CULTURE     Status: None   Collection Time    11/20/12 10:45 AM      Result Value Range Status   Specimen Description NOSE   Final   Special Requests NONE   Final   Culture     Final   Value: NO STAPHYLOCOCCUS  AUREUS ISOLATED     Note: NOMRSA     Performed at [facility]   Report Status 11/22/2012 FINAL   Final  URINE CULTURE     Status: None   Collection Time    11/27/12  7:43 PM      Result Value Range Status   Specimen Description URINE, RANDOM   Final   Special Requests NONE   Final   Culture  Setup Time     Final   Value: 11/28/2012 01:03     Performed at [facility] Count     Final   Value: NO GROWTH     Performed at [facility]   Culture     Final   Value: NO GROWTH     Performed at [facility]   Report Status 11/29/2012 FINAL   Final  MRSA PCR SCREENING     Status: Abnormal   Collection Time    11/28/12  1:23 AM      Result Value Range Status   MRSA by PCR POSITIVE (*) NEGATIVE Final   Comment:            The GeneXpert MRSA Assay (FDA  approved for NASAL specimens     only), is one component of a     comprehensive MRSA colonization     surveillance program. It is not     intended to diagnose MRSA     infection nor to guide or     monitor treatment for     MRSA infections.     RESULT CALLED TO, READ BACK BY AND VERIFIED WITH:     Johnnette Litter RN [phone] [id number] GREEN R  CULTURE, BLOOD (ROUTINE X 2)     Status: None   Collection Time    11/28/12  2:20 PM      Result Value Range Status   Specimen Description BLOOD LEFT ARM   Final   Special Requests     Final   Value: BOTTLES DRAWN AEROBIC AND ANAEROBIC 10CC AER,8CC ANA   Culture  Setup Time     Final   Value: 11/28/2012 22:46     Performed at [facility]   Culture     Final   Value:        BLOOD CULTURE RECEIVED NO GROWTH TO DATE CULTURE WILL BE HELD FOR 5 DAYS BEFORE ISSUING A FINAL NEGATIVE REPORT     Performed at [facility]   Report Status PENDING   Incomplete  CULTURE, BLOOD (ROUTINE X 2)     Status: None   Collection Time    11/28/12  4:28 PM      Result Value Range Status   Specimen Description BLOOD LEFT ARM   Final   Special Requests      Final   Value: BOTTLES DRAWN AEROBIC AND ANAEROBIC 10CC AER,5CC ANA   Culture  Setup Time     Final   Value: 11/28/2012 22:46     Performed at [facility]   Culture     Final   Value:        BLOOD CULTURE RECEIVED NO GROWTH TO DATE CULTURE WILL BE HELD FOR 5 DAYS BEFORE ISSUING A FINAL NEGATIVE REPORT     Performed at [facility]   Report Status PENDING   Incomplete    Medical History: Past Medical History  Diagnosis Date  . Hypertension   . Gout     Acute episode involving ankle in 12/2011  . Hyperlipidemia   . Deep vein thrombophlebitis of left leg 11/2010    LLE; S/P THA  . Fasting hyperglycemia   . Stroke 08/2009; 07/2010    TIA in 07/2010  . H/O heat stroke 08/2009  . Prostate cancer 2013    adenocarcinoma; Gleason grade 8; PSA 9.45; EBRT 2013-14  . Renal cyst   . Syncope     01/2012: Negative pharmacologic stress nuclear  . PAF (paroxysmal atrial fibrillation)     a. first noted 01/2012 during admission for URI;  b. seen again 03/2012 after nstemi of unknown origin - pradaxa initiated.  Marland Kitchen CAD (coronary artery disease)     a. 01/2012 Elev Ti-->nonischemic MV;  b. 03/2012 NSTEMI/Cath: LM 50, LAD 50p/m, 48m/d, LCX nl, RI 60ost sup branch, RCA diff nonobs, EF 55%   Assessment: 79 y/o M with fever (Tmax 102.9) to start vancomycin/zosyn per pharmacy. WBC wnl, nothing on CXR.   Goal of Therapy:  Vancomycin trough level 15-20 mcg/ml  Plan:  -Vancomycin 1000 mg IV q12h -Zosyn 3.375G IV q8h to be infused over 4 hours -Trend WBC, temp, renal function -F/U source of infection   Thank you for  allowing me to take part in this patient's care,  Abran Duke, PharmD Clinical Pharmacist Phone: [phone] Pager: [phone] 11/29/2012 8:01 AM

## 2012-11-30 DIAGNOSIS — M109 Gout, unspecified: Secondary | ICD-10-CM | POA: Diagnosis present

## 2012-11-30 LAB — GLUCOSE, CAPILLARY: Glucose-Capillary: 102 mg/dL — ABNORMAL HIGH (ref 70–99)

## 2012-11-30 MED ORDER — NAPROXEN 500 MG PO TABS
500.0000 mg | ORAL_TABLET | Freq: Two times a day (BID) | ORAL | Status: DC
  Administered 2012-11-30 – 2012-12-02 (×5): 500 mg via ORAL
  Filled 2012-11-30 (×7): qty 1

## 2012-11-30 MED ORDER — SODIUM CHLORIDE 0.9 % IV SOLN: INTRAVENOUS | Status: DC

## 2012-11-30 MED ORDER — PREDNISONE 50 MG PO TABS
60.0000 mg | ORAL_TABLET | Freq: Once | ORAL | Status: AC
  Administered 2012-11-30: 14:00:00 60 mg via ORAL
  Filled 2012-11-30: qty 1

## 2012-11-30 NOTE — Progress Notes (Signed)
Patient ID: DAT DERKSEN  male  ZOX:[id number]    DOB: 1931/05/25    DOA: 11/27/2012  PCP: Kirk Ruths, MD  Assessment/Plan: Principal Problem:   Acute encephalopathy with postconcussion syndrome - [name] mental status is actually improving, he was able to converse and answer most questions, brother at the bedside - [name] brother however was very concerned about the gout attack in the right foot, states that he takes prednisone for that - Continue Provigil for now, EEG normal, consistent with previous encephalopathy.  -CT with some enlarged ventricles which per Neuro normal    Active Problems:  gout in the right foot - ESR was elevated, ordered uric acid, - Currently on colchicine, added Naprosyn, one dose of prednisone 60 mg, will reevaluate in a.m. if improving  Acute febrile illness: Source unclear, blood culture has been negative so far were started empirically on vancomycin and Zosyn. Still had a temp of 100.1 at 3 AM - No leukocytosis -Urine culture showed no growth - Blood cultures negative to date     Hypertension- stable    Hyperlipidemia- stable    Generalized weakness: PT evaluation ordered    Atrial fibrillation on Chronic anticoagulation - Rate control, continue Pradaxa  DVT Prophylaxis:On pradaxa   Code Status:  Disposition:Hopefully tomorrow, we'll arrange home PTOT    Subjective: Alert today, brother at the bedside, complaining of pain in the right foot  Objective: Weight change:   Intake/Output Summary (Last 24 hours) at 11/30/12 1206 Last data filed at 11/30/12 0915  Gross per 24 hour  Intake    780 ml  Output    900 ml  Net   -120 ml   Blood pressure 122/82, pulse 78, temperature 99.3 F (37.4 C), temperature source Oral, resp. rate 18, height 5\' 8"  (1.727 m), weight 100.2 kg (220 lb 14.4 oz), SpO2 99.00%.  Physical Exam: General: Alert and awake, oriented x3, not in any acute distress. CVS: S1-S2 clear, no murmur rubs or  gallops Chest: clear to auscultation bilaterally, no wheezing, rales or rhonchi Abdomen: soft nontender, nondistended, normal bowel sounds  Extremities: no cyanosis, clubbing or edema noted bilaterally, right foot swollen and tender  Neuro: Cranial nerves II-XII intact, no focal neurological deficits  Lab Results: Basic Metabolic Panel:  Recent Labs Lab 11/27/12 1758 11/29/12 0645  NA 138 142  K 3.9 4.1  CL 100 110  CO2 27 23  GLUCOSE 116* 107*  BUN 16 11  CREATININE 1.17 1.00  CALCIUM 10.1 9.4   Liver Function Tests:  Recent Labs Lab 11/27/12 1758  AST 21  ALT 19  ALKPHOS 86  BILITOT 0.5  PROT 6.2  ALBUMIN 2.9*   No results found for this basename: LIPASE, AMYLASE,  in the last 168 hours  Recent Labs Lab 11/27/12 1823  AMMONIA <10*   CBC:  Recent Labs Lab 11/27/12 1758 11/29/12 0645  WBC 6.6 7.1  NEUTROABS 4.3  --   HGB 12.6* 11.0*  HCT 37.0* 33.2*  MCV 102.5* 102.8*  PLT 169 174   Cardiac Enzymes:  Recent Labs Lab 11/27/12 1824 11/28/12 0205  CKTOTAL  --  20  TROPONINI <0.30  --    BNP: No components found with this basename: POCBNP,  CBG:  Recent Labs Lab 11/28/12 2217 11/29/12 2030 11/30/12 0908 11/30/12 0916 11/30/12 0934  GLUCAP 135* 84 65* 71 102*     Micro Results: Recent Results (from the past 240 hour(s))  URINE CULTURE     Status: None  Collection Time    11/27/12  7:43 PM      Result Value Range Status   Specimen Description URINE, RANDOM   Final   Special Requests NONE   Final   Culture  Setup Time     Final   Value: 11/28/2012 01:03     Performed at [facility] Count     Final   Value: NO GROWTH     Performed at [facility]   Culture     Final   Value: NO GROWTH     Performed at [facility]   Report Status 11/29/2012 FINAL   Final  MRSA PCR SCREENING     Status: Abnormal   Collection Time    11/28/12  1:23 AM      Result Value Range Status   MRSA by PCR POSITIVE (*)  NEGATIVE Final   Comment:            The GeneXpert MRSA Assay (FDA     approved for NASAL specimens     only), is one component of a     comprehensive MRSA colonization     surveillance program. It is not     intended to diagnose MRSA     infection nor to guide or     monitor treatment for     MRSA infections.     RESULT CALLED TO, READ BACK BY AND VERIFIED WITH:     Johnnette Litter RN [phone] 0410 GREEN R  CULTURE, BLOOD (ROUTINE X 2)     Status: None   Collection Time    11/28/12  2:20 PM      Result Value Range Status   Specimen Description BLOOD LEFT ARM   Final   Special Requests     Final   Value: BOTTLES DRAWN AEROBIC AND ANAEROBIC 10CC AER,8CC ANA   Culture  Setup Time     Final   Value: 11/28/2012 22:46     Performed at [facility]   Culture     Final   Value:        BLOOD CULTURE RECEIVED NO GROWTH TO DATE CULTURE WILL BE HELD FOR 5 DAYS BEFORE ISSUING A FINAL NEGATIVE REPORT     Performed at [facility]   Report Status PENDING   Incomplete  CULTURE, BLOOD (ROUTINE X 2)     Status: None   Collection Time    11/28/12  4:28 PM      Result Value Range Status   Specimen Description BLOOD LEFT ARM   Final   Special Requests     Final   Value: BOTTLES DRAWN AEROBIC AND ANAEROBIC 10CC AER,5CC ANA   Culture  Setup Time     Final   Value: 11/28/2012 22:46     Performed at [facility]   Culture     Final   Value:        BLOOD CULTURE RECEIVED NO GROWTH TO DATE CULTURE WILL BE HELD FOR 5 DAYS BEFORE ISSUING A FINAL NEGATIVE REPORT     Performed at [facility]   Report Status PENDING   Incomplete    Studies/Results: Dg Chest 1 View  11/20/2012   CLINICAL DATA:  Pain post trauma  EXAM: CHEST - 1 VIEW  COMPARISON:  Jun 11, 2012  FINDINGS: Lungs are clear. Heart size and pulmonary vascularity are normal. Mild mediastinal prominence compared to the prior study is probably due to supine  as opposed to upright positioning. No adenopathy. No bone  lesions. No pneumothorax.  IMPRESSION: No edema or consolidation. No pneumothorax. Relative prominence the mediastinum is probably due to supine positioning coupled with great vessel prominence.   Electronically Signed   By: Bretta Bang M.D.   On: 11/20/2012 08:05   Dg Lumbar Spine Complete  11/20/2012   CLINICAL DATA:  Pain post trauma  EXAM: LUMBAR SPINE - COMPLETE 4+ VIEW  COMPARISON:  CT abdomen and pelvis was sagittal reformats November 09, 2011  FINDINGS: Frontal, lateral, spot lumbosacral lateral, and bilateral oblique views were obtained. There are 5 non-rib-bearing lumbar type vertebral bodies. There is levoscoliosis. There is no fracture or spondylolisthesis. There is marked disc space narrowing at L1-2, L2-3, and L3-4. These are stable findings. There is facet osteoarthritic change to varying degrees at all levels. There is atherosclerotic change in the aorta.  IMPRESSION:  There is multilevel osteoarthritic change. Scoliosis. No acute fracture or spondylolisthesis.   Electronically Signed   By: Bretta Bang M.D.   On: 11/20/2012 08:07   Dg Pelvis 1-2 Views  11/20/2012   CLINICAL DATA:  Pain post trauma  EXAM: PELVIS - 1-2 VIEW  COMPARISON:  Jun 11, 2012  FINDINGS: There is a total hip prosthesis on the left which appears well-seated. There is no demonstrable acute fracture or dislocation. There is moderate narrowing of the right hip joint. There are surgical clips overlying the pubic symphysis. Bones are osteoporotic. There are vascular calcifications in the pelvis.  IMPRESSION: No acute fracture or dislocation. Total hip prosthesis on the left. Moderate narrowing right hip joint. Bones are osteoporotic.   Electronically Signed   By: Bretta Bang M.D.   On: 11/20/2012 08:04   Ct Head Wo Contrast  11/27/2012   CLINICAL DATA:  Altered mental status.  EXAM: CT HEAD WITHOUT CONTRAST  TECHNIQUE: Contiguous axial images were obtained from the base of the skull through the vertex  without intravenous contrast.  COMPARISON:  Head CT 11/20/2012 and 01/02/2012. MRI brain 11/21/2012.  FINDINGS: There is stable atrophy with ventricular dilatation and periventricular low density. No acute intracranial hemorrhage, mass lesion, brain edema or extra-axial fluid collection is demonstrated. There is no evidence of acute infarct. Chronic small vessel ischemic changes are stable.  The visualized paranasal sinuses and middle ears are clear. There is a persistent mastoid effusion on the right, unchanged. The left mastoid air cells are clear. The calvarium is intact.  IMPRESSION: Stable atrophy, ventriculomegaly and right mastoid effusion. No acute intracranial findings.   Electronically Signed   By: Roxy Horseman M.D.   On: 11/27/2012 20:03   Ct Cervical Spine Wo Contrast  11/20/2012   CLINICAL DATA:  Pain and altered mental status post trauma  EXAM: CT HEAD WITHOUT CONTRAST  CT CERVICAL SPINE WITHOUT CONTRAST  TECHNIQUE: Multidetector CT imaging of the head and cervical spine was performed following the standard protocol without intravenous contrast. Multiplanar CT image reconstructions of the cervical spine were also generated.  COMPARISON:  Brain CT January 02, 2012  FINDINGS: CT HEAD FINDINGS  There is moderate generalized ventricular enlargement with mild sulcal enlargement. These are stable findings.  There is no mass, hemorrhage, extra-axial fluid collection, or midline shift. There is mild small vessel disease in the centra semiovale bilaterally, stable. There is a small tiny prior lacunar infarct in the superior left cerebellum. There is no new gray-white compartment lesion. No acute infarct.  Bony calvarium appears intact. Mastoid air cells on the left  are clear. There is opacification of multiple inferior mastoid air cells on the right, a stable finding.  CT CERVICAL SPINE FINDINGS  There is no fracture. Minimal anterolisthesis of C3 on C4 is felt to be due to spondylosis. There is no other  spondylolisthesis. Prevertebral soft tissues and predental space regions are normal. There is marked disc space narrowing at C5-6. There is modest disc space narrowing at C3-4. Other disc spaces appear intact.  There is multilevel facet hypertrophy, most marked at C3-4 on the left, C4-5 bilaterally, C5-6 bilaterally, more on the right than on the left, and C7-T1 on the left. There is exit foraminal narrowing due to bony hypertrophy at C3-4 on the left at C5-6 on the right. The there is no frank disc extrusion. There is mild stenosis at C5-6 due to bony hypertrophy and diffuse disc bulging.  There is calcification in both carotid arteries. Chronic mastoid disease on the right is noted in the head CT report. Bones do appear somewhat osteoporotic.  IMPRESSION: CT head: Stable atrophy. The ventricles are in proportion larger than sulci. This finding raises concern for a degree of concomitant normal pressure hydrocephalus.  There is no intracranial mass or hemorrhage. There is no acute appearing infarct. There is evidence of a small lacunar infarct in the left cerebellum. There is mild periventricular small vessel disease. There is chronic mastoid disease on the right.  CT cervical spine: Extensive arthropathy. Mild spinal stenosis at C5-6 the, multifactorial. No fracture. Slight anterolisthesis of C3 on C4 is felt to be due to spondylosis. No other spondylolisthesis seen.   Electronically Signed   By: Bretta Bang M.D.   On: 11/20/2012 08:18   Mr Brain Wo Contrast  11/21/2012   CLINICAL DATA:  Recent falls. Weakness.  EXAM: MRI HEAD WITHOUT CONTRAST  TECHNIQUE: Multiplanar, multisequence MR imaging was performed. No intravenous contrast was administered.  COMPARISON:  11/20/2012 and 01/02/2012 CT. 08/03/2011 MR.  FINDINGS: Exam is motion degraded.  No acute infarct.  No obvious intracranial hemorrhage.  Atrophy with superimposed hydrocephalus suspected. The periventricular white matter type changes may be  related to small vessel disease and/or subependymal reabsorption of fluid.  No intracranial mass lesion noted on this unenhanced exam.  Major intracranial vascular structures are patent with small right vertebral artery.  Opacification inferior right mastoid air cells. This is been noted on prior exams. No obstructing lesion posterior superior nasopharynx.  IMPRESSION: Exam is motion degraded.  No acute infarct.  No obvious intracranial hemorrhage.  Atrophy with superimposed hydrocephalus suspected. The periventricular white matter type changes may be related to small vessel disease and/or subependymal reabsorption of fluid.  Major intracranial vascular structures are patent with small right vertebral artery.  Opacification inferior right mastoid air cells. This is been noted on prior exams.   Electronically Signed   By: Bridgett Larsson M.D.   On: 11/21/2012 12:29   Mr Lumbar Spine Wo Contrast  11/21/2012   CLINICAL DATA:  Leg weakness with unsteady gait following recent fall. History of prostate cancer and anti coagulation therapy.  EXAM: MRI LUMBAR SPINE WITHOUT CONTRAST  TECHNIQUE: Multiplanar, multisequence MR imaging was performed. No intravenous contrast was administered.  COMPARISON:  Radiographs 11/20/2012. Abdominal pelvic CT 11/09/2011.  FINDINGS: Examination is moderately motion degraded. CT demonstrate 5 lumbar-type vertebral bodies. The alignment is stable with a convex left scoliosis. There is no evidence of fracture or pars defect.  The conus medullaris extends to the L2 level and appears normal. No paraspinal abnormalities are  identified.  L1-2: There is a central disc extrusion with caudal migration of a disc fragment behind the L2 vertebral body, best seen on the sagittal images. No mass effect on the conus medullaris or significant foraminal compromise results.  L2-3: There is chronic disc degeneration with annular bulging and paraspinal osteophytes, asymmetric to the right. Hyperintensity within  the disc space and is likely degenerative. There is no suspicious endplate abnormality. There is mild facet and ligamentous hypertrophy. There is moderate right and mild left foraminal stenosis.  L3-4: There is chronic disc degeneration with annular bulging and paraspinal osteophytes. There is facet and ligamentous hypertrophy. These factors contribute to fairly symmetric chronic foraminal and lateral recess stenosis bilaterally.  L4-5: Disc height is relatively maintained. There is mild facet and ligamentous hypertrophy. No significant spinal stenosis or nerve root encroachment.  L5-S1: There is a degenerative anterolisthesis with annular disc bulging and moderate bilateral facet hypertrophy. There is a possible extraforaminal disc extrusion on the left, not optimally evaluated due to motion on the axial images. Asymmetric facet hypertrophy is present on the right. There is moderate to severe foraminal narrowing bilaterally, worse on the left. There is probable bilateral L5 nerve root encroachment.  IMPRESSION: 1. Significant left foraminal stenosis at L5-S1 with suspicion of an extraforaminal disc extrusion on the left. There is possible encroachment on either L5 nerve. 2. Central disc extrusion at L1-2 with caudal migration of disc material, but no resulting mass effect on the conus medullaris or the exiting nerve roots. 3. Chronic degenerative disc disease at L2-3 and L3-4 with resulting chronic mild central, lateral recess and foraminal stenosis. 4. No acute findings demonstrated. Please note the study is moderately motion degraded.   Electronically Signed   By: Roxy Horseman M.D.   On: 11/21/2012 12:33   Dg Chest Portable 1 View  11/27/2012   CLINICAL DATA:  Hypertension  EXAM: PORTABLE CHEST - 1 VIEW  COMPARISON:  11/20/2012  FINDINGS: Cardiac shadow is stable. The lungs are clear bilaterally. No acute bony abnormality is noted.  IMPRESSION: No active disease.   Electronically Signed   By: Alcide Clever M.D.    On: 11/27/2012 18:45    Medications: Scheduled Meds: . allopurinol  300 mg Oral Daily  . aspirin EC  81 mg Oral Daily  . calcium-vitamin D  1 tablet Oral q morning - 10a  . Chlorhexidine Gluconate Cloth  6 each Topical Q0600  . colchicine  0.6 mg Oral Daily  . dabigatran  150 mg Oral Q12H  . diltiazem  180 mg Oral Daily  . fluconazole  150 mg Oral Daily  . metoprolol tartrate  25 mg Oral BID  . modafinil  100 mg Oral Daily  . mupirocin ointment  1 application Nasal BID  . naproxen  500 mg Oral BID WC  . piperacillin-tazobactam (ZOSYN)  IV  3.375 g Intravenous Q8H  . predniSONE  60 mg Oral Once  . sodium chloride  3 mL Intravenous Q12H  . vancomycin  1,000 mg Intravenous Q12H  . ascorbic acid  1,000 mg Oral q morning - 10a      LOS: 3 days   Azreal Stthomas M.D. [facility] 11/30/2012, 12:06 PM Pager: [phone]  If 7PM-7AM, please contact night-coverage [email or web address] Password [email or web address]

## 2012-12-01 LAB — BASIC METABOLIC PANEL
BUN: 27 mg/dL — ABNORMAL HIGH (ref 6–23)
Calcium: 9 mg/dL (ref 8.4–10.5)
Chloride: 103 mEq/L (ref 96–112)
GFR calc non Af Amer: 42 mL/min — ABNORMAL LOW (ref >90)
Glucose, Bld: 292 mg/dL — ABNORMAL HIGH (ref 70–99)
Sodium: 134 mEq/L — ABNORMAL LOW (ref 135–145)

## 2012-12-01 LAB — HEMOGLOBIN A1C: Hgb A1c MFr Bld: 6.6 % — ABNORMAL HIGH (ref <5.7)

## 2012-12-01 LAB — CBC
Hemoglobin: 10.7 g/dL — ABNORMAL LOW (ref 13.0–17.0)
MCH: 34.5 pg — ABNORMAL HIGH (ref 26.0–34.0)
MCHC: 34.5 g/dL (ref 30.0–36.0)
MCV: 100 fL (ref 78.0–100.0)
Platelets: 183 10*3/uL (ref 150–400)

## 2012-12-01 LAB — GLUCOSE, CAPILLARY
Glucose-Capillary: 320 mg/dL — ABNORMAL HIGH (ref 70–99)
Glucose-Capillary: 377 mg/dL — ABNORMAL HIGH (ref 70–99)

## 2012-12-01 MED ORDER — SODIUM CHLORIDE 0.9 % IV BOLUS (SEPSIS)
500.0000 mL | Freq: Once | INTRAVENOUS | Status: AC
  Administered 2012-12-01: 500 mL via INTRAVENOUS

## 2012-12-01 MED ORDER — INSULIN ASPART 100 UNIT/ML ~~LOC~~ SOLN: 0.0000 [IU] | Freq: Three times a day (TID) | SUBCUTANEOUS | Status: DC

## 2012-12-01 MED ORDER — INSULIN ASPART 100 UNIT/ML ~~LOC~~ SOLN
0.0000 [IU] | Freq: Every day | SUBCUTANEOUS | Status: DC
  Administered 2012-12-01: 5 [IU] via SUBCUTANEOUS

## 2012-12-01 MED ORDER — PREDNISONE 20 MG PO TABS
40.0000 mg | ORAL_TABLET | Freq: Every day | ORAL | Status: AC
  Administered 2012-12-01 – 2012-12-02 (×2): 40 mg via ORAL
  Filled 2012-12-01 (×2): qty 2

## 2012-12-01 NOTE — Progress Notes (Signed)
Pt CBG elevated to 320 with dinner check. Dr. Isidoro Donning notified and made aware. Will continue to monitor closely. Levonne Spiller, RN

## 2012-12-01 NOTE — Progress Notes (Signed)
Patient ID: [name]  male  AVW:[id number]    DOB: 10-26-1931    DOA: 11/27/2012  PCP: Kirk Ruths, MD  Assessment/Plan: Principal Problem:   Acute encephalopathy with postconcussion syndrome: Resolved  - [name] mental status is  significantly, currently reading a newspaper and appropriately conversant, brother at the bedside confirms baseline mental status now  - Continue Provigil for now, EEG normal, consistent with previous encephalopathy.  -CT with some enlarged ventricles which per Neuro normal    Active Problems:  gout in the right foot: Improving - ESR was elevated,  -  uric acid normal, ordered uric acid, - Currently on colchicine,Naprosyn, decrease prednisone to 40 mg   Acute febrile illness: Source unclear, blood culture has been negative so far were started empirically on vancomycin and Zosyn. Still had a temp of 100.1 at 3 AM - No leukocytosis, Will discontinue vancomycin and Zosyn -Urine culture showed no growth - Blood cultures negative to date      Hypotension - Patient called up with physical therapy, was noticed to be orthostatic, hold BP meds for now, gentle hydration recommended    Hyperlipidemia- stable    Generalized weakness: PT evaluation ordered    Atrial fibrillation on Chronic anticoagulation - Rate control, continue Pradaxa    DVT Prophylaxis:On pradaxa   Code Status:  Disposition:Hopefully tomorrow, we'll arrange home PTOT    Subjective: Alert, reading newspaper, conversing normally, who feels that his foot is better  Objective: Weight change:   Intake/Output Summary (Last 24 hours) at 12/01/12 1041 Last data filed at 11/30/12 1600  Gross per 24 hour  Intake    390 ml  Output    300 ml  Net     90 ml   Blood pressure 98/55, pulse 96, temperature 97.9 F (36.6 C), temperature source Oral, resp. rate 18, height 5\' 8"  (1.727 m), weight 100.2 kg (220 lb 14.4 oz), SpO2 97.00%.  Physical Exam: General: A x O x3. CVS: S1-S2  clear, no murmur rubs or gallops Chest: clear to auscultation bilaterally, no wheezing, rales or rhonchi Abdomen: soft nontender, nondistended, normal bowel sounds  Extremities: no c/c/e, right foot better  Neuro: Cranial nerves II-XII intact, no focal neurological deficits  Lab Results: Basic Metabolic Panel:  Recent Labs Lab 11/27/12 1758 11/29/12 0645  NA 138 142  K 3.9 4.1  CL 100 110  CO2 27 23  GLUCOSE 116* 107*  BUN 16 11  CREATININE 1.17 1.00  CALCIUM 10.1 9.4   Liver Function Tests:  Recent Labs Lab 11/27/12 1758  AST 21  ALT 19  ALKPHOS 86  BILITOT 0.5  PROT 6.2  ALBUMIN 2.9*   No results found for this basename: LIPASE, AMYLASE,  in the last 168 hours  Recent Labs Lab 11/27/12 1823  AMMONIA <10*   CBC:  Recent Labs Lab 11/27/12 1758 11/29/12 0645  WBC 6.6 7.1  NEUTROABS 4.3  --   HGB 12.6* 11.0*  HCT 37.0* 33.2*  MCV 102.5* 102.8*  PLT 169 174   Cardiac Enzymes:  Recent Labs Lab 11/27/12 1824 11/28/12 0205  CKTOTAL  --  20  TROPONINI <0.30  --    BNP: No components found with this basename: POCBNP,  CBG:  Recent Labs Lab 11/28/12 2217 11/29/12 2030 11/30/12 0908 11/30/12 0916 11/30/12 0934  GLUCAP 135* 84 65* 71 102*     Micro Results: Recent Results (from the past 240 hour(s))  URINE CULTURE     Status: None   Collection Time  11/27/12  7:43 PM      Result Value Range Status   Specimen Description URINE, RANDOM   Final   Special Requests NONE   Final   Culture  Setup Time     Final   Value: 11/28/2012 01:03     Performed at [facility] Count     Final   Value: NO GROWTH     Performed at [facility]   Culture     Final   Value: NO GROWTH     Performed at [facility]   Report Status 11/29/2012 FINAL   Final  MRSA PCR SCREENING     Status: Abnormal   Collection Time    11/28/12  1:23 AM      Result Value Range Status   MRSA by PCR POSITIVE (*) NEGATIVE Final   Comment:             The GeneXpert MRSA Assay (FDA     approved for NASAL specimens     only), is one component of a     comprehensive MRSA colonization     surveillance program. It is not     intended to diagnose MRSA     infection nor to guide or     monitor treatment for     MRSA infections.     RESULT CALLED TO, READ BACK BY AND VERIFIED WITH:     Johnnette Litter RN [phone] GREEN R  CULTURE, BLOOD (ROUTINE X 2)     Status: None   Collection Time    11/28/12  2:20 PM      Result Value Range Status   Specimen Description BLOOD LEFT ARM   Final   Special Requests     Final   Value: BOTTLES DRAWN AEROBIC AND ANAEROBIC 10CC AER,8CC ANA   Culture  Setup Time     Final   Value: 11/28/2012 22:46     Performed at [facility]   Culture     Final   Value:        BLOOD CULTURE RECEIVED NO GROWTH TO DATE CULTURE WILL BE HELD FOR 5 DAYS BEFORE ISSUING A FINAL NEGATIVE REPORT     Performed at [facility]   Report Status PENDING   Incomplete  CULTURE, BLOOD (ROUTINE X 2)     Status: None   Collection Time    11/28/12  4:28 PM      Result Value Range Status   Specimen Description BLOOD LEFT ARM   Final   Special Requests     Final   Value: BOTTLES DRAWN AEROBIC AND ANAEROBIC 10CC AER,5CC ANA   Culture  Setup Time     Final   Value: 11/28/2012 22:46     Performed at [facility]   Culture     Final   Value:        BLOOD CULTURE RECEIVED NO GROWTH TO DATE CULTURE WILL BE HELD FOR 5 DAYS BEFORE ISSUING A FINAL NEGATIVE REPORT     Performed at [facility]   Report Status PENDING   Incomplete    Studies/Results: Dg Chest 1 View  11/20/2012   CLINICAL DATA:  Pain post trauma  EXAM: CHEST - 1 VIEW  COMPARISON:  Jun 11, 2012  FINDINGS: Lungs are clear. Heart size and pulmonary vascularity are normal. Mild mediastinal prominence compared to the prior study is probably due to supine as opposed to upright positioning.  No adenopathy. No bone lesions. No pneumothorax.   IMPRESSION: No edema or consolidation. No pneumothorax. Relative prominence the mediastinum is probably due to supine positioning coupled with great vessel prominence.   Electronically Signed   By: Bretta Bang M.D.   On: 11/20/2012 08:05   Dg Lumbar Spine Complete  11/20/2012   CLINICAL DATA:  Pain post trauma  EXAM: LUMBAR SPINE - COMPLETE 4+ VIEW  COMPARISON:  CT abdomen and pelvis was sagittal reformats November 09, 2011  FINDINGS: Frontal, lateral, spot lumbosacral lateral, and bilateral oblique views were obtained. There are 5 non-rib-bearing lumbar type vertebral bodies. There is levoscoliosis. There is no fracture or spondylolisthesis. There is marked disc space narrowing at L1-2, L2-3, and L3-4. These are stable findings. There is facet osteoarthritic change to varying degrees at all levels. There is atherosclerotic change in the aorta.  IMPRESSION:  There is multilevel osteoarthritic change. Scoliosis. No acute fracture or spondylolisthesis.   Electronically Signed   By: Bretta Bang M.D.   On: 11/20/2012 08:07   Dg Pelvis 1-2 Views  11/20/2012   CLINICAL DATA:  Pain post trauma  EXAM: PELVIS - 1-2 VIEW  COMPARISON:  Jun 11, 2012  FINDINGS: There is a total hip prosthesis on the left which appears well-seated. There is no demonstrable acute fracture or dislocation. There is moderate narrowing of the right hip joint. There are surgical clips overlying the pubic symphysis. Bones are osteoporotic. There are vascular calcifications in the pelvis.  IMPRESSION: No acute fracture or dislocation. Total hip prosthesis on the left. Moderate narrowing right hip joint. Bones are osteoporotic.   Electronically Signed   By: Bretta Bang M.D.   On: 11/20/2012 08:04   Ct Head Wo Contrast  11/27/2012   CLINICAL DATA:  Altered mental status.  EXAM: CT HEAD WITHOUT CONTRAST  TECHNIQUE: Contiguous axial images were obtained from the base of the skull through the vertex without intravenous contrast.   COMPARISON:  Head CT 11/20/2012 and 01/02/2012. MRI brain 11/21/2012.  FINDINGS: There is stable atrophy with ventricular dilatation and periventricular low density. No acute intracranial hemorrhage, mass lesion, brain edema or extra-axial fluid collection is demonstrated. There is no evidence of acute infarct. Chronic small vessel ischemic changes are stable.  The visualized paranasal sinuses and middle ears are clear. There is a persistent mastoid effusion on the right, unchanged. The left mastoid air cells are clear. The calvarium is intact.  IMPRESSION: Stable atrophy, ventriculomegaly and right mastoid effusion. No acute intracranial findings.   Electronically Signed   By: Roxy Horseman M.D.   On: 11/27/2012 20:03   Ct Cervical Spine Wo Contrast  11/20/2012   CLINICAL DATA:  Pain and altered mental status post trauma  EXAM: CT HEAD WITHOUT CONTRAST  CT CERVICAL SPINE WITHOUT CONTRAST  TECHNIQUE: Multidetector CT imaging of the head and cervical spine was performed following the standard protocol without intravenous contrast. Multiplanar CT image reconstructions of the cervical spine were also generated.  COMPARISON:  Brain CT January 02, 2012  FINDINGS: CT HEAD FINDINGS  There is moderate generalized ventricular enlargement with mild sulcal enlargement. These are stable findings.  There is no mass, hemorrhage, extra-axial fluid collection, or midline shift. There is mild small vessel disease in the centra semiovale bilaterally, stable. There is a small tiny prior lacunar infarct in the superior left cerebellum. There is no new gray-white compartment lesion. No acute infarct.  Bony calvarium appears intact. Mastoid air cells on the left are clear. There is opacification  of multiple inferior mastoid air cells on the right, a stable finding.  CT CERVICAL SPINE FINDINGS  There is no fracture. Minimal anterolisthesis of C3 on C4 is felt to be due to spondylosis. There is no other spondylolisthesis. Prevertebral  soft tissues and predental space regions are normal. There is marked disc space narrowing at C5-6. There is modest disc space narrowing at C3-4. Other disc spaces appear intact.  There is multilevel facet hypertrophy, most marked at C3-4 on the left, C4-5 bilaterally, C5-6 bilaterally, more on the right than on the left, and C7-T1 on the left. There is exit foraminal narrowing due to bony hypertrophy at C3-4 on the left at C5-6 on the right. The there is no frank disc extrusion. There is mild stenosis at C5-6 due to bony hypertrophy and diffuse disc bulging.  There is calcification in both carotid arteries. Chronic mastoid disease on the right is noted in the head CT report. Bones do appear somewhat osteoporotic.  IMPRESSION: CT head: Stable atrophy. The ventricles are in proportion larger than sulci. This finding raises concern for a degree of concomitant normal pressure hydrocephalus.  There is no intracranial mass or hemorrhage. There is no acute appearing infarct. There is evidence of a small lacunar infarct in the left cerebellum. There is mild periventricular small vessel disease. There is chronic mastoid disease on the right.  CT cervical spine: Extensive arthropathy. Mild spinal stenosis at C5-6 the, multifactorial. No fracture. Slight anterolisthesis of C3 on C4 is felt to be due to spondylosis. No other spondylolisthesis seen.   Electronically Signed   By: Bretta Bang M.D.   On: 11/20/2012 08:18   Mr Brain Wo Contrast  11/21/2012   CLINICAL DATA:  Recent falls. Weakness.  EXAM: MRI HEAD WITHOUT CONTRAST  TECHNIQUE: Multiplanar, multisequence MR imaging was performed. No intravenous contrast was administered.  COMPARISON:  11/20/2012 and 01/02/2012 CT. 08/03/2011 MR.  FINDINGS: Exam is motion degraded.  No acute infarct.  No obvious intracranial hemorrhage.  Atrophy with superimposed hydrocephalus suspected. The periventricular white matter type changes may be related to small vessel disease  and/or subependymal reabsorption of fluid.  No intracranial mass lesion noted on this unenhanced exam.  Major intracranial vascular structures are patent with small right vertebral artery.  Opacification inferior right mastoid air cells. This is been noted on prior exams. No obstructing lesion posterior superior nasopharynx.  IMPRESSION: Exam is motion degraded.  No acute infarct.  No obvious intracranial hemorrhage.  Atrophy with superimposed hydrocephalus suspected. The periventricular white matter type changes may be related to small vessel disease and/or subependymal reabsorption of fluid.  Major intracranial vascular structures are patent with small right vertebral artery.  Opacification inferior right mastoid air cells. This is been noted on prior exams.   Electronically Signed   By: Bridgett Larsson M.D.   On: 11/21/2012 12:29   Mr Lumbar Spine Wo Contrast  11/21/2012   CLINICAL DATA:  Leg weakness with unsteady gait following recent fall. History of prostate cancer and anti coagulation therapy.  EXAM: MRI LUMBAR SPINE WITHOUT CONTRAST  TECHNIQUE: Multiplanar, multisequence MR imaging was performed. No intravenous contrast was administered.  COMPARISON:  Radiographs 11/20/2012. Abdominal pelvic CT 11/09/2011.  FINDINGS: Examination is moderately motion degraded. CT demonstrate 5 lumbar-type vertebral bodies. The alignment is stable with a convex left scoliosis. There is no evidence of fracture or pars defect.  The conus medullaris extends to the L2 level and appears normal. No paraspinal abnormalities are identified.  L1-2: There is  a central disc extrusion with caudal migration of a disc fragment behind the L2 vertebral body, best seen on the sagittal images. No mass effect on the conus medullaris or significant foraminal compromise results.  L2-3: There is chronic disc degeneration with annular bulging and paraspinal osteophytes, asymmetric to the right. Hyperintensity within the disc space and is likely  degenerative. There is no suspicious endplate abnormality. There is mild facet and ligamentous hypertrophy. There is moderate right and mild left foraminal stenosis.  L3-4: There is chronic disc degeneration with annular bulging and paraspinal osteophytes. There is facet and ligamentous hypertrophy. These factors contribute to fairly symmetric chronic foraminal and lateral recess stenosis bilaterally.  L4-5: Disc height is relatively maintained. There is mild facet and ligamentous hypertrophy. No significant spinal stenosis or nerve root encroachment.  L5-S1: There is a degenerative anterolisthesis with annular disc bulging and moderate bilateral facet hypertrophy. There is a possible extraforaminal disc extrusion on the left, not optimally evaluated due to motion on the axial images. Asymmetric facet hypertrophy is present on the right. There is moderate to severe foraminal narrowing bilaterally, worse on the left. There is probable bilateral L5 nerve root encroachment.  IMPRESSION: 1. Significant left foraminal stenosis at L5-S1 with suspicion of an extraforaminal disc extrusion on the left. There is possible encroachment on either L5 nerve. 2. Central disc extrusion at L1-2 with caudal migration of disc material, but no resulting mass effect on the conus medullaris or the exiting nerve roots. 3. Chronic degenerative disc disease at L2-3 and L3-4 with resulting chronic mild central, lateral recess and foraminal stenosis. 4. No acute findings demonstrated. Please note the study is moderately motion degraded.   Electronically Signed   By: Roxy Horseman M.D.   On: 11/21/2012 12:33   Dg Chest Portable 1 View  11/27/2012   CLINICAL DATA:  Hypertension  EXAM: PORTABLE CHEST - 1 VIEW  COMPARISON:  11/20/2012  FINDINGS: Cardiac shadow is stable. The lungs are clear bilaterally. No acute bony abnormality is noted.  IMPRESSION: No active disease.   Electronically Signed   By: Alcide Clever M.D.   On: 11/27/2012 18:45     Medications: Scheduled Meds: . allopurinol  300 mg Oral Daily  . aspirin EC  81 mg Oral Daily  . calcium-vitamin D  1 tablet Oral q morning - 10a  . Chlorhexidine Gluconate Cloth  6 each Topical Q0600  . colchicine  0.6 mg Oral Daily  . dabigatran  150 mg Oral Q12H  . diltiazem  180 mg Oral Daily  . fluconazole  150 mg Oral Daily  . metoprolol tartrate  25 mg Oral BID  . modafinil  100 mg Oral Daily  . mupirocin ointment  1 application Nasal BID  . naproxen  500 mg Oral BID WC  . predniSONE  40 mg Oral Daily  . sodium chloride  3 mL Intravenous Q12H  . ascorbic acid  1,000 mg Oral q morning - 10a      LOS: 4 days   RAI,RIPUDEEP M.D. [facility] 12/01/2012, 10:41 AM Pager: [phone]  If 7PM-7AM, please contact night-coverage [email or web address] Password [email or web address]

## 2012-12-01 NOTE — Care Management Note (Addendum)
    Page 1 of 2   12/02/2012     10:13:01 AM   CARE MANAGEMENT NOTE 12/02/2012  Patient:  [name], [name]   Account Number:  [id number]  Date Initiated:  12/01/2012  Documentation initiated by:  GRAVES-BIGELOW,BRENDA  Subjective/Objective Assessment:   Pt admitted for generalized weakness. Pt is from home with wife and has 24 hr supervision.     Action/Plan:   Pt is active with [facility] for RN/PT services. Will need resumption orders. CM did call [facility] and SOC to begin within 24-48 hours post d/c.   Anticipated DC Date:  12/02/2012   Anticipated DC Plan:  HOME W HOME HEALTH SERVICES      DC Planning Services  CM consult      [facility] Choice  HOME HEALTH   Choice offered to / List presented to:  C-1 Patient        HH arranged  HH-1 RN  HH-10 DISEASE MANAGEMENT  HH-2 PT  HH-3 OT  HH-4 NURSE'S AIDE      HH agency  [facility]   Status of service:  Completed, signed off Medicare Important Message given?   (If response is "NO", the following Medicare IM given date fields will be blank) Date Medicare IM given:   Date Additional Medicare IM given:    Discharge Disposition:  HOME W HOME HEALTH SERVICES  Per UR Regulation:  Reviewed for med. necessity/level of care/duration of stay  If discussed at Long Length of Stay Meetings, dates discussed:    Comments:  12/02/12- 1010- Donn Pierini RN, BSN [phone] Pt for d/c today- orders resumed for [facility]- RN/PT with addition of OT/aide- call made to [facility] with [facility] for referral of additional HH needs and resumption orders-

## 2012-12-01 NOTE — Progress Notes (Signed)
Inpatient Diabetes Program Recommendations  AACE/ADA: New Consensus Statement on Inpatient Glycemic Control (2013)  Target Ranges:  Prepandial:   less than 140 mg/dL      Peak postprandial:   less than 180 mg/dL (1-2 hours)      Critically ill patients:  140 - 180 mg/dL   Results for TYREQUE, FINKEN (MRN [id number]) as of 12/01/2012 09:51  Ref. Range 05/05/2012 00:47  Hemoglobin A1C Latest Range: <5.7 % 5.6  Results for JACQUEES, GONGORA (MRN [id number]) as of 12/01/2012 09:51  Ref. Range 11/27/2012 17:54 11/28/2012 22:17 11/29/2012 20:30 11/30/2012 09:08 11/30/2012 09:16 11/30/2012 09:34  Glucose-Capillary Latest Range: 70-99 mg/dL 308 (H) 657 (H) 84 65 (L) 71 102 (H)   Inpatient Diabetes Program Recommendations Correction (SSI): Please consider ordering CBGs with Novolog sensitive correction ACHS while inpatient. HgbA1C: Please order and A1C to determine glycemic control over the past 2-3 months. Outpatient Diabetes Medications:  At time of discharge, please re-evaluate need for Glipizide as an outpatient. Diet: May want to change diet from Regular to Carb Modified diet.  Note: Patient has a history of elevated fasting glucose and takes Glipizide 2.5 mg daily as an outpatient.  Patient was initially ordered Glipizide 2.5 mg daily but it was discontinued on 11/5 @ 9:25 am due to hypoglycemic event.  Currently as an inpatient, patient is not ordered any medications for inpatient glycemic control. Last A1C was 5.6% on 05/05/12.  Please order CBGs with Novolog sensitive correction ACHS and an A1C.  May want to change diet from Regular to Carb Modified diet.  Also, at time of discharge, please re-evaluate need for Glipizide as an outpatient.  Will continue to follow.  Thanks, Orlando Penner, RN, MSN, CCRN Diabetes Coordinator Inpatient Diabetes Program [phone] (Team Pager) [phone] ([facility] office) [phone] [facility] [place] office)'

## 2012-12-01 NOTE — Evaluation (Signed)
Physical Therapy Evaluation Patient Details Name: [name] MRN: [id number] DOB: 10-30-1931 Today's Date: 12/01/2012 Time: 4098-1191 PT Time Calculation (min): 43 min  PT Assessment / Plan / Recommendation History of Present Illness  79 y.o. Male with a history of HTN, a fib, and a fall 2 weeks ago come in complaining of lethargy, increased sleepiness, and generalized weakness. New onset of gout pain in feet.  Clinical Impression  Pt with decreased BP upon standing today decreasing to 86/40 with pt being symptomatic.  Discussed d/c plans with pt and brothers (who are very involved in his care), and pt declining SNF and wants to go home with his aides.  They report they are there 24 hours to A pt and his wife.  Will follow pt acutely to work towards addressing deficits and to reach highest level of function before returning home with HHPT.    PT Assessment  Patient needs continued PT services    Follow Up Recommendations  Home health PT (PT recommended short term SNF, but pt refusing. He has 24 hour aides for he and his wife.)    Does the patient have the potential to tolerate intense rehabilitation      Barriers to Discharge        Equipment Recommendations  None recommended by PT    Recommendations for Other Services     Frequency Min 3X/week    Precautions / Restrictions Precautions Precautions: Fall   Pertinent Vitals/Pain 2/10 gout pain in feet      Mobility  Bed Mobility Bed Mobility: Supine to Sit Supine to Sit: 4: Min assist Details for Bed Mobility Assistance: brothers helping.  Pt c/o initial dizziness in sitting which subsided after ~ 30 seconds. Transfers Transfers: Sit to Stand Sit to Stand: 4: Min assist;From elevated surface;From bed Stand to Sit: 4: Min guard;To chair/3-in-1;To bed Details for Transfer Assistance: Pt stood and marched in place for pre-gait assessment.  Pt reported feeling swimmy headed.  Sat pt down and checked BP 86/40.  Nursing was  in room.  Deferred gait and transferred pt to recliner and encouraged sitting up.  BP 98/55 when PT left pt.  Nursing and brother in room. Ambulation/Gait Ambulation/Gait Assistance Details: Unable to ambulate today due to decreased BP.  Pt frustrated.    Exercises     PT Diagnosis: Difficulty walking;Generalized weakness  PT Problem List: Decreased mobility;Decreased activity tolerance;Decreased strength;Decreased balance PT Treatment Interventions: Gait training;Functional mobility training;Stair training;Therapeutic exercise;Therapeutic activities;Patient/family education;Balance training     PT Goals(Current goals can be found in the care plan section) Acute Rehab PT Goals Patient Stated Goal: Wants to go home. PT Goal Formulation: With patient Time For Goal Achievement: 12/15/12 Potential to Achieve Goals: Good  Visit Information  Last PT Received On: 12/01/12 Assistance Needed: +1 History of Present Illness: 79 y.o. Male with a history of HTN, a fib, and a fall 2 weeks ago come in complaining of lethargy, increased sleepiness, and generalized weakness. New onset of gout pain in feet.       Prior Functioning  Home Living Family/patient expects to be discharged to:: Private residence Living Arrangements: Spouse/significant other Available Help at Discharge: Family;Personal care attendant;Available 24 hours/day Type of Home: House Home Access: Stairs to enter [employer] of Steps: 3 Entrance Stairs-Rails: Left Home Layout: One level Home Equipment: Bedside commode;Walker - 2 wheels;Tub bench;Wheelchair - manual Prior Function Level of Independence: Needs assistance Gait / Transfers Assistance Needed: ambulates with SBA and a walker Comments: pt and  wife have multiple people assisting in their care to cover 24 hours/day Communication Communication: [facility]    Cognition  Cognition Arousal/Alertness: Awake/alert Behavior During Therapy: WFL for tasks  assessed/performed Overall Cognitive Status: Within Functional Limits for tasks assessed    Extremity/Trunk Assessment Lower Extremity Assessment Lower Extremity Assessment: Generalized weakness Cervical / Trunk Assessment Cervical / Trunk Assessment: Normal   Balance Balance Balance Assessed: Yes Static Standing Balance Static Standing - Balance Support: Bilateral upper extremity supported Static Standing - Level of Assistance: 5: Stand by assistance;4: Min assist  End of Session PT - End of Session Equipment Utilized During Treatment: Gait belt Activity Tolerance: Treatment limited secondary to medical complications (Comment) (decreased BP) Patient left: in chair;with family/visitor present;with nursing/sitter in room Nurse Communication: Mobility status  GP     Lacheryl Niesen LUBECK 12/01/2012, 10:09 AM

## 2012-12-02 DIAGNOSIS — E2749 Other adrenocortical insufficiency: Secondary | ICD-10-CM

## 2012-12-02 DIAGNOSIS — N179 Acute kidney failure, unspecified: Secondary | ICD-10-CM | POA: Diagnosis present

## 2012-12-02 LAB — BASIC METABOLIC PANEL
BUN: 31 mg/dL — ABNORMAL HIGH (ref 6–23)
Calcium: 8.7 mg/dL (ref 8.4–10.5)
Chloride: 102 mEq/L (ref 96–112)
GFR calc non Af Amer: 44 mL/min — ABNORMAL LOW (ref >90)
Glucose, Bld: 142 mg/dL — ABNORMAL HIGH (ref 70–99)
Sodium: 133 mEq/L — ABNORMAL LOW (ref 135–145)

## 2012-12-02 LAB — GLUCOSE, CAPILLARY: Glucose-Capillary: 180 mg/dL — ABNORMAL HIGH (ref 70–99)

## 2012-12-02 MED ORDER — PREDNISONE 10 MG PO TABS: ORAL_TABLET | ORAL | Status: DC

## 2012-12-02 MED ORDER — INSULIN ASPART 100 UNIT/ML ~~LOC~~ SOLN
0.0000 [IU] | Freq: Three times a day (TID) | SUBCUTANEOUS | Status: DC
  Administered 2012-12-02 (×2): 4 [IU] via SUBCUTANEOUS

## 2012-12-02 MED ORDER — PREDNISONE 10 MG PO TABS: 10.0000 mg | ORAL_TABLET | Freq: Every day | ORAL | Status: DC | PRN

## 2012-12-02 MED ORDER — METOPROLOL TARTRATE 25 MG PO TABS: 25.0000 mg | ORAL_TABLET | Freq: Two times a day (BID) | ORAL | Status: DC

## 2012-12-02 MED ORDER — MODAFINIL 100 MG PO TABS: 100.0000 mg | ORAL_TABLET | Freq: Every day | ORAL | Status: DC

## 2012-12-02 MED ORDER — DABIGATRAN ETEXILATE MESYLATE 150 MG PO CAPS: 150.0000 mg | ORAL_CAPSULE | Freq: Two times a day (BID) | ORAL | Status: DC

## 2012-12-02 MED ORDER — COLCHICINE 0.6 MG PO TABS: 0.6000 mg | ORAL_TABLET | Freq: Every day | ORAL | Status: DC

## 2012-12-02 NOTE — Progress Notes (Signed)
Pts CBG 377, on call NP notified and new orders received. Will continue to monitor.

## 2012-12-02 NOTE — Progress Notes (Signed)
NURSING PROGRESS NOTE  [name] [id number] Discharge Data: 12/02/2012 1:45 PM Attending Provider: Cathren Harsh, MD GEX:[id number] Judie Petit, MD     Marveen Reeks Sliwinski to be D/C'd Home per MD order.  Discussed with the patient and his brother the After Visit Summary and all questions fully answered. All IV's discontinued with no bleeding noted. Telemetry discontinued. All belongings returned to patient for patient to take home.   Last Vital Signs:  Blood pressure 121/63, pulse 83, temperature 98 F (36.7 C), temperature source Oral, resp. rate 18, height 5\' 8"  (1.727 m), weight 102.649 kg (226 lb 4.8 oz), SpO2 98.00%.  Discharge Medication List   Medication List    STOP taking these medications       enalapril 5 MG tablet  Commonly known as:  VASOTEC     furosemide 20 MG tablet  Commonly known as:  LASIX      TAKE these medications       allopurinol 300 MG tablet  Commonly known as:  ZYLOPRIM  Take 300 mg by mouth daily.     ascorbic acid 500 MG tablet  Commonly known as:  VITAMIN C  Take 1,000 mg by mouth every morning.     aspirin EC 81 MG tablet  Take 81 mg by mouth daily.     calcium-vitamin D 500-200 MG-UNIT per tablet  Commonly known as:  OSCAL WITH D  Take 1 tablet by mouth every morning.     cholecalciferol 1000 UNITS tablet  Commonly known as:  VITAMIN D  Take 1,000 Units by mouth daily.     COD LIVER OIL PO  Take 2 tablets by mouth 2 (two) times daily.     dabigatran 150 MG Caps capsule  Commonly known as:  PRADAXA  Take 1 capsule (150 mg total) by mouth 2 (two) times daily.     diltiazem 180 MG 24 hr capsule  Commonly known as:  CARDIZEM CD  Take 180 mg by mouth daily.     glipiZIDE 2.5 MG 24 hr tablet  Commonly known as:  GLUCOTROL XL  Take 2.5 mg by mouth daily.     metoprolol tartrate 25 MG tablet  Commonly known as:  LOPRESSOR  Take 1 tablet (25 mg total) by mouth 2 (two) times daily.     modafinil 100 MG tablet  Commonly known as:   PROVIGIL  Take 1 tablet (100 mg total) by mouth daily. Daily for 2 weeks, then as needed if still excessive sleepiness     multivitamin with minerals Tabs tablet  Take 1 tablet by mouth every morning.     nitroGLYCERIN 0.4 MG SL tablet  Commonly known as:  NITROSTAT  Place 0.4 mg under the tongue every 5 (five) minutes as needed for chest pain.     predniSONE 10 MG tablet  Commonly known as:  DELTASONE  Take 1 tablet (10 mg total) by mouth daily as needed (severe gout.). Hold this while you are on higher dose prednisone taper     predniSONE 10 MG tablet  Commonly known as:  DELTASONE  Prednisone dosing: Take  Prednisone 30mg  (3 tabs) x 2 days, then 20mg  (2 tabs) x 2days, then 10mg  (1 tab) x 2days, then OFF. (gout attack)  Start taking on:  12/03/2012     simvastatin 20 MG tablet  Commonly known as:  ZOCOR  Take 20 mg by mouth at bedtime.

## 2012-12-02 NOTE — Progress Notes (Signed)
Physical Therapy Treatment Patient Details Name: [name] MRN: [id number] DOB: 09/26/1931 Today's Date: 12/02/2012 Time: 5366-4403 PT Time Calculation (min): 24 min  PT Assessment / Plan / Recommendation  History of Present Illness 79 y.o. Male with a history of HTN, a fib, and a fall 2 weeks ago come in complaining of lethargy, increased sleepiness, and generalized weakness. New onset of gout pain in feet.   PT Comments   Patient mobilizing with mod pain with gout in left foot, but able to manage with help of brother today.  Feel can go home with 24 hour assist and HHPT if medically stable.  Follow Up Recommendations  Home health PT;Supervision/Assistance - 24 hour     Does the patient have the potential to tolerate intense rehabilitation   N/A  Barriers to Discharge  None      Equipment Recommendations  None recommended by PT    Recommendations for Other Services  None  Frequency Min 3X/week   Progress towards PT Goals Progress towards PT goals: Progressing toward goals  Plan Current plan remains appropriate    Precautions / Restrictions Precautions Precautions: Fall Precaution Comments: orange isolation   Pertinent Vitals/Pain mod c/o left foot pain with gout    Mobility  Bed Mobility Supine to Sit: 3: Mod assist Details for Bed Mobility Assistance: assist with feet off bed and trunk upright Transfers Sit to Stand: 4: Min assist;From elevated surface;From bed Stand to Sit: 4: Min guard;To chair/3-in-1;To bed Details for Transfer Assistance: lifting help from bed with pt verbalizing safe hand placement Ambulation/Gait Ambulation/Gait Assistance: 4: Min assist Ambulation Distance (Feet): 60 Feet Assistive device: Rolling walker Ambulation/Gait Assistance Details: assist to keep walker close and cues to stay inside; brother helping too Gait Pattern: Step-through pattern;Antalgic;Trunk flexed;Wide base of support    Exercises       PT Goals (current goals can  now be found in the care plan section)    Visit Information  Last PT Received On: 12/02/12 Assistance Needed: +1 History of Present Illness: 79 y.o. Male with a history of HTN, a fib, and a fall 2 weeks ago come in complaining of lethargy, increased sleepiness, and generalized weakness. New onset of gout pain in feet.    Subjective Data   Can I go home this afternoon?   Cognition  Cognition Arousal/Alertness: Awake/alert Behavior During Therapy: WFL for tasks assessed/performed Overall Cognitive Status: Within Functional Limits for tasks assessed    Balance  Static Standing Balance Static Standing - Balance Support: Bilateral upper extremity supported Static Standing - Level of Assistance: 4: Min assist  End of Session PT - End of Session Equipment Utilized During Treatment: Gait belt Activity Tolerance: Patient tolerated treatment well Patient left: in chair;with family/visitor present;with call bell/phone within reach   GP     [facility] 12/02/2012, 11:25 AM Sheran Lawless, PT [phone] 12/02/2012

## 2012-12-02 NOTE — Discharge Summary (Signed)
Physician Discharge Summary  Patient ID: [name] MRN: [id number] DOB/AGE: 10-07-1931 79 y.o.  Admit date: 11/27/2012 Discharge date: 12/02/2012  Primary Care Physician:  Scott Ruths, MD  Final Discharge Diagnoses:   Acute encephalopathy and lethargy secondary to concussion - resolved   secondary discharge diagnosis . CAD (coronary artery disease) . Deep vein thrombophlebitis of left leg . Generalized weakness . Hyperlipidemia . Hypertension . Unsteady gait . Gout attack . Acute kidney injury  Consults:  Neurology, [name]   Recommendations for Outpatient Follow-up:  1) please refer patient to [name] if persistent problems with hypersomnolence. Patient was recommended 100 mg of Provigil daily for 2 weeks and then use as needed after that if still having continued sleepiness or lethargy. 2) recheck BMET at the time of next visit. Lasix and Vasotec has been on hold 3) patient had gout attack during hospitalization, currently on prednisone taper, please follow   Allergies:   Allergies  Allergen Reactions  . Tetanus Toxoids Swelling    Also allergic to "high powered pain medications."     Discharge Medications:   Medication List    STOP taking these medications       enalapril 5 MG tablet  Commonly known as:  VASOTEC     furosemide 20 MG tablet  Commonly known as:  LASIX      TAKE these medications       allopurinol 300 MG tablet  Commonly known as:  ZYLOPRIM  Take 300 mg by mouth daily.     ascorbic acid 500 MG tablet  Commonly known as:  VITAMIN C  Take 1,000 mg by mouth every morning.     aspirin EC 81 MG tablet  Take 81 mg by mouth daily.     calcium-vitamin D 500-200 MG-UNIT per tablet  Commonly known as:  OSCAL WITH D  Take 1 tablet by mouth every morning.     cholecalciferol 1000 UNITS tablet  Commonly known as:  VITAMIN D  Take 1,000 Units by mouth daily.     COD LIVER OIL PO  Take 2 tablets by mouth 2 (two) times  daily.     dabigatran 150 MG Caps capsule  Commonly known as:  PRADAXA  Take 1 capsule (150 mg total) by mouth 2 (two) times daily.     diltiazem 180 MG 24 hr capsule  Commonly known as:  CARDIZEM CD  Take 180 mg by mouth daily.     glipiZIDE 2.5 MG 24 hr tablet  Commonly known as:  GLUCOTROL XL  Take 2.5 mg by mouth daily.     metoprolol tartrate 25 MG tablet  Commonly known as:  LOPRESSOR  Take 1 tablet (25 mg total) by mouth 2 (two) times daily.     modafinil 100 MG tablet  Commonly known as:  PROVIGIL  Take 1 tablet (100 mg total) by mouth daily.     multivitamin with minerals Tabs tablet  Take 1 tablet by mouth every morning.     nitroGLYCERIN 0.4 MG SL tablet  Commonly known as:  NITROSTAT  Place 0.4 mg under the tongue every 5 (five) minutes as needed for chest pain.     predniSONE 10 MG tablet  Commonly known as:  DELTASONE  Prednisone dosing: Take  Prednisone 30mg  (3 tabs) x 2 days, then 20mg  (2 tabs) x 2days, then 10mg  (1 tab) x 2days, then OFF. (gout attack)     predniSONE 10 MG tablet  Commonly known as:  DELTASONE  Take 1 tablet (10 mg total) by mouth daily as needed (severe gout.). Hold this while you are on higher dose prednisone taper     simvastatin 20 MG tablet  Commonly known as:  ZOCOR  Take 20 mg by mouth at bedtime.         Brief H and P: For complete details please refer to admission H and P, but in briefRobert J [name] is an 79 y.o. male with hx of afib, DVT, on Pradaxa, gout, HTN, admitted to [facility] about 2 weeks ago after a fall. He was having lethargy, increased sleepiness, and generalized weakness. Work up included slightly low B12 level, negative MRI for acute infarcts, and head CT showed slight increase in ventricular size. He was seen by [name] of neurology, who did not think the ventricular size was excessively large for his age. It was felt at that time that it was medication related events. Since then, he had been off his pain meds, and  had persistent sleepiness. He has not been able to take care of himself adequately. He lived at home with his wife who has ESRD on HD.  He has no HA, fever, chills, or any other symptomology. He has not been known to have loud snoring, and was doing better until his fall 2 weeks ago. Work up in the ER included negative UA, negative CXR, and negative UDS. Hospitalist was asked to admit him for persistent lethargy and weakness.   Hospital Course:  patient is a 79 year old male with history of fall and postconcussive hypersomnolence.. Neurology was consulted and initially patient was started on a 200 mg daily however he seemed over stimulated and hence was decreased to 100 mg daily Acute encephalopathy with postconcussion syndrome: Resolved  [name] mental status is significantly, he has been reading newspaper every morning and and appropriately conversant, brother at the bedside confirms baseline mental status now. Patient was recommended Provigil 100 mg daily for 2 weeks and as needed after. EEG was normal, consistent with previous encephalopathy.  -CT scan of the head showed ventriculomegaly and right mastoid effusion, neurology ventricular size is normal for his age.   Acute gout in the right foot: Improving, ESR was elevated, uric acid was normal. Patient was continued on colchicine, placed on Naprosyn for anti-inflammatory effect and prednisone. However colchicine and Naprosyn also caused his creatinine to rise up to 1.5, hence colchicine and Naprosyn were discontinued. Patient is currently on prednisone taper.  Acute febrile illness: On 11/28/12, patient had acute febrile illness, Source unclear, blood culture has been negative so far, patient was started empirically on vancomycin and Zosyn.  No leukocytosis, Bank and Zosyn were discontinued on 12/01/2012. Marland KitchenUrine culture showed no growth   Hypotension - Patient ambulated with physical therapy on 12/02/2038, was noticed to be orthostatic, h BP meds  were held and patient was provided gentle hydration.  Hyperlipidemia- stable  Generalized weakness: PT evaluation recommended skilled nursing facility for rehabilitation however patient declined and wanted to go home, he has 24/7 caregiver, physical therapist. Home PT, OT, RN, HHA were resumed  Atrial fibrillation on Chronic anticoagulation  - Rate control, continue Pradaxa   Acute kidney injury : improving, creatinine was 1.5 on 12/01/12, likely worsened due to Naprosyn, colchicine and had low BP. Creatinine improved to 1.4.      Day of Discharge BP 121/63  Pulse 83  Temp(Src) 98 F (36.7 C) (Oral)  Resp 18  Ht 5\' 8"  (1.727 m)  Wt 102.649 kg (226 lb 4.8  oz)  BMI 34.42 kg/m2  SpO2 98%  Physical Exam: General: Alert and awake oriented x3 not in any acute distress. CVS: S1-S2 clear no murmur rubs or gallops Chest: clear to auscultation bilaterally, no wheezing rales or rhonchi Abdomen: soft nontender, nondistended, normal bowel sounds, no organomegaly Extremities: no cyanosis, clubbing or edema noted bilaterally Neuro: Cranial nerves II-XII intact, no focal neurological deficits   The results of significant diagnostics from this hospitalization (including imaging, microbiology, ancillary and laboratory) are listed below for reference.    LAB RESULTS: Basic Metabolic Panel:  Recent Labs Lab 12/01/12 1005 12/02/12 1137  NA 134* 133*  K 4.3 3.8  CL 103 102  CO2 18* 21  GLUCOSE 292* 142*  BUN 27* 31*  CREATININE 1.51* 1.46*  CALCIUM 9.0 8.7   Liver Function Tests:  Recent Labs Lab 11/27/12 1758  AST 21  ALT 19  ALKPHOS 86  BILITOT 0.5  PROT 6.2  ALBUMIN 2.9*   No results found for this basename: LIPASE, AMYLASE,  in the last 168 hours  Recent Labs Lab 11/27/12 1823  AMMONIA <10*   CBC:  Recent Labs Lab 11/27/12 1758 11/29/12 0645 12/01/12 1005  WBC 6.6 7.1 9.6  NEUTROABS 4.3  --   --   HGB 12.6* 11.0* 10.7*  HCT 37.0* 33.2* 31.0*  MCV 102.5*  102.8* 100.0  PLT 169 174 183   Cardiac Enzymes:  Recent Labs Lab 11/27/12 1824 11/28/12 0205  CKTOTAL  --  20  TROPONINI <0.30  --    BNP: No components found with this basename: POCBNP,  CBG:  Recent Labs Lab 12/02/12 0738 12/02/12 1151  GLUCAP 180* 154*    Significant Diagnostic Studies:  Ct Head Wo Contrast  11/27/2012   CLINICAL DATA:  Altered mental status.  EXAM: CT HEAD WITHOUT CONTRAST  TECHNIQUE: Contiguous axial images were obtained from the base of the skull through the vertex without intravenous contrast.  COMPARISON:  Head CT 11/20/2012 and 01/02/2012. MRI brain 11/21/2012.  FINDINGS: There is stable atrophy with ventricular dilatation and periventricular low density. No acute intracranial hemorrhage, mass lesion, brain edema or extra-axial fluid collection is demonstrated. There is no evidence of acute infarct. Chronic small vessel ischemic changes are stable.  The visualized paranasal sinuses and middle ears are clear. There is a persistent mastoid effusion on the right, unchanged. The left mastoid air cells are clear. The calvarium is intact.  IMPRESSION: Stable atrophy, ventriculomegaly and right mastoid effusion. No acute intracranial findings.   Electronically Signed   By: Roxy Horseman M.D.   On: 11/27/2012 20:03   Dg Chest Portable 1 View  11/27/2012   CLINICAL DATA:  Hypertension  EXAM: PORTABLE CHEST - 1 VIEW  COMPARISON:  11/20/2012  FINDINGS: Cardiac shadow is stable. The lungs are clear bilaterally. No acute bony abnormality is noted.  IMPRESSION: No active disease.   Electronically Signed   By: Alcide Clever M.D.   On: 11/27/2012 18:45      Disposition and Follow-up:     Discharge Orders   Future Orders Complete By Expires   Diet Carb Modified  As directed    Discharge instructions  As directed    Comments:     1) please note all your medication changes 2) please HOLD lasix, enalapril and reduce dose of metoprolol. 3) please have your primary care  physician check BMET at the appt for the kidney function.  4) please complete prednisone taper as prescribed for the gout attack.  Increase activity slowly  As directed        DISPOSITION: Home   DIET: Heart healthy diet, carb modified  ACTIVITY:As tolerated   DISCHARGE FOLLOW-UP Follow-up Information   Follow up with Scott Ruths, MD. Schedule an appointment as soon as possible for a visit in 10 days. (for hospital follow-up, check BMET labs for kidney function)    Specialty:  Family Medicine   Contact information:   [address] [place] [address] [phone]       Time spent on Discharge: 45 minutes   Signed:   Payal Stanforth M.D. [facility] 12/02/2012, 12:38 PM Pager: [phone]

## 2012-12-04 LAB — CULTURE, BLOOD (ROUTINE X 2)
Culture: NO GROWTH
Culture: NO GROWTH

## 2012-12-07 ENCOUNTER — Encounter: Payer: Self-pay | Admitting: Internal Medicine

## 2012-12-08 NOTE — ED Provider Notes (Signed)
I saw and evaluated the patient, reviewed the resident's note and I agree with the findings and plan. I personally reviewed pt's EKG.    See other note.   Raeford Razor, MD 12/08/12 1022

## 2012-12-16 ENCOUNTER — Encounter: Payer: Self-pay | Admitting: Adult Health

## 2012-12-16 ENCOUNTER — Encounter: Payer: Self-pay | Admitting: *Deleted

## 2012-12-16 ENCOUNTER — Ambulatory Visit (INDEPENDENT_AMBULATORY_CARE_PROVIDER_SITE_OTHER): Payer: Medicare Other | Admitting: Adult Health

## 2012-12-16 VITALS — BP 120/72 | HR 105 | Ht 68.0 in | Wt 226.0 lb

## 2012-12-16 DIAGNOSIS — R269 Unspecified abnormalities of gait and mobility: Secondary | ICD-10-CM

## 2012-12-16 DIAGNOSIS — R2681 Unsteadiness on feet: Secondary | ICD-10-CM

## 2012-12-16 DIAGNOSIS — I2510 Atherosclerotic heart disease of native coronary artery without angina pectoris: Secondary | ICD-10-CM

## 2012-12-16 DIAGNOSIS — I10 Essential (primary) hypertension: Secondary | ICD-10-CM

## 2012-12-16 DIAGNOSIS — I4891 Unspecified atrial fibrillation: Secondary | ICD-10-CM

## 2012-12-16 DIAGNOSIS — D649 Anemia, unspecified: Secondary | ICD-10-CM

## 2012-12-16 LAB — CBC
HCT: 34.1 % — ABNORMAL LOW (ref 39.0–52.0)
Hemoglobin: 11.4 g/dL — ABNORMAL LOW (ref 13.0–17.0)
MCH: 32.9 pg (ref 26.0–34.0)
MCHC: 33.4 g/dL (ref 30.0–36.0)

## 2012-12-16 LAB — BASIC METABOLIC PANEL
BUN: 19 mg/dL (ref 6–23)
Calcium: 9.6 mg/dL (ref 8.4–10.5)
Glucose, Bld: 279 mg/dL — ABNORMAL HIGH (ref 70–99)
Sodium: 140 mEq/L (ref 135–145)

## 2012-12-16 NOTE — Progress Notes (Signed)
HPI: [name] is a 79 year old former patient of Dr. Dietrich [name] we are following for ongoing assessment and management of atrial fibrillation, CAD, and hypertension. She was last seen in the office in April 2014. He had been to the ER secondary to GERD symptoms. The patient's medication was not changed.     He was recently admitted to [facility] after sustaining a fall and having a slight concussion. He was taken off of furosemide, and decreased his enalapril. He is working with home PT. He is taking his time getting up from sitting and lying position.           Allergies  Allergen Reactions  . Tetanus Toxoids Swelling    Also allergic to "high powered pain medications."    Current Outpatient Prescriptions  Medication Sig Dispense Refill  . allopurinol (ZYLOPRIM) 300 MG tablet Take 300 mg by mouth daily.      Marland Kitchen ascorbic acid (VITAMIN C) 500 MG tablet Take 1,000 mg by mouth every morning.       Marland Kitchen aspirin EC 81 MG tablet Take 81 mg by mouth daily.      . calcium-vitamin D (OSCAL WITH D) 500-200 MG-UNIT per tablet Take 1 tablet by mouth every morning.       . cholecalciferol (VITAMIN D) 1000 UNITS tablet Take 1,000 Units by mouth daily.      . COD LIVER OIL PO Take 2 tablets by mouth 2 (two) times daily.       Marland Kitchen diltiazem (CARDIZEM CD) 180 MG 24 hr capsule Take 180 mg by mouth daily.      Marland Kitchen glipiZIDE (GLUCOTROL XL) 2.5 MG 24 hr tablet Take 2.5 mg by mouth daily.       . metoprolol (LOPRESSOR) 25 MG tablet Take 1 tablet (25 mg total) by mouth 2 (two) times daily.  60 tablet  3  . Multiple Vitamin (MULITIVITAMIN WITH MINERALS) TABS Take 1 tablet by mouth every morning.       . nitroGLYCERIN (NITROSTAT) 0.4 MG SL tablet Place 0.4 mg under the tongue every 5 (five) minutes as needed for chest pain.      . predniSONE (DELTASONE) 10 MG tablet Take 1 tablet (10 mg total) by mouth daily as needed (severe gout.). Hold this while you are on higher dose prednisone taper      . simvastatin (ZOCOR)  20 MG tablet Take 20 mg by mouth at bedtime.       . dabigatran (PRADAXA) 150 MG CAPS capsule Take 1 capsule (150 mg total) by mouth 2 (two) times daily.  60 capsule  3  . predniSONE (DELTASONE) 10 MG tablet Prednisone dosing: Take  Prednisone 30mg  (3 tabs) x 2 days, then 20mg  (2 tabs) x 2days, then 10mg  (1 tab) x 2days, then OFF. (gout attack)  12 tablet  0   No current facility-administered medications for this visit.    Past Medical History  Diagnosis Date  . Hypertension   . Gout     Acute episode involving ankle in 12/2011  . Hyperlipidemia   . Deep vein thrombophlebitis of left leg 11/2010    LLE; S/P THA  . Fasting hyperglycemia   . Stroke 08/2009; 07/2010    TIA in 07/2010  . H/O heat stroke 08/2009  . Prostate cancer 2013    adenocarcinoma; Gleason grade 8; PSA 9.45; EBRT 2013-14  . Renal cyst   . Syncope     01/2012: Negative pharmacologic stress nuclear  . PAF (paroxysmal  atrial fibrillation)     a. first noted 01/2012 during admission for URI;  b. seen again 03/2012 after nstemi of unknown origin - pradaxa initiated.  Marland Kitchen CAD (coronary artery disease)     a. 01/2012 Elev Ti-->nonischemic MV;  b. 03/2012 NSTEMI/Cath: LM 50, LAD 50p/m, 84m/d, LCX nl, RI 60ost sup branch, RCA diff nonobs, EF 55%    Past Surgical History  Procedure Laterality Date  . Hip arthroplasty  12/08/2010    ARTHROPLASTY BIPOLAR HIP;  Surgeon-Olin; Left  . 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 I&d extremity  05/28/2011    Procedure: IRRIGATION AND DEBRIDEMENT EXTREMITY;  Surgeon: Scott Covert, MD;  Location: [facility] OR;  Service: Orthopedics;  Laterality: Right;  I & D Right hand/wrist  . Colonoscopy   07/18/2003    RMR: Rectum internal hemorrhoids.  Otherwise, normal rectum/ Left-sided diverticula.  Remainder of colonic mucosa-nl    ZOX:WRUEAV of systems complete and found to be negative unless listed above  PHYSICAL EXAM BP 120/72  Pulse 105  Ht 5\' 8"  (1.727 m)  Wt  226 lb (102.513 kg)  BMI 34.37 kg/m2  General: Well developed, well nourished, in no acute distress Head: Eyes PERRLA, No xanthomas.   Normal cephalic and atramatic  Lungs: Clear bilaterally to auscultation and percussion. Heart: HIRR S1 S2, with 1/6 systolic murmur. Pulses are 2+ & equal.            No carotid bruit. No JVD.  No abdominal bruits. No femoral bruits. Abdomen: Bowel sounds are positive, abdomen soft and non-tender without masses or                  Hernia's noted. Msk:  Back normal, normal gait. Normal strength and tone for age. Extremities: No clubbing, cyanosis 1+ pitting  Pre-tibial edema.  DP +1 Neuro: Alert and oriented X 3. Psych:  Good affect, responds appropriately    ASSESSMENT AND PLAN

## 2012-12-16 NOTE — Assessment & Plan Note (Addendum)
Heart rate is controlled on diltiazem and metoprolol. He is given samples of Pradaxa. Close follow up with CBC as he was mildly anemic on discharge. Will repeat CBC. He will watch for bleeding in stool and urine.  He is to report this to [place]. Also with recent fall, will watch closely as well, for need to stop anticoagulation.

## 2012-12-16 NOTE — Assessment & Plan Note (Signed)
Blood pressure is well controlled currently. He is taking his time with position change. He states he is not dizzy or lightheaded. He has not fallen since discharge.

## 2012-12-16 NOTE — Assessment & Plan Note (Signed)
Working with PT at home. Getting stronger each day. Now using walker.

## 2012-12-16 NOTE — Patient Instructions (Addendum)
You will need lab work today:  CBC, BMP  Your physician recommends that you schedule a follow-up appointment in: 3 months with Lorin Picket NP  No medication changes were made today

## 2012-12-16 NOTE — Assessment & Plan Note (Signed)
Denies chest pain or DOE.

## 2013-01-10 ENCOUNTER — Other Ambulatory Visit (HOSPITAL_COMMUNITY): Payer: Self-pay | Admitting: Family Medicine

## 2013-01-10 DIAGNOSIS — R609 Edema, unspecified: Secondary | ICD-10-CM

## 2013-01-11 ENCOUNTER — Ambulatory Visit (HOSPITAL_COMMUNITY)
Admission: RE | Admit: 2013-01-11 | Discharge: 2013-01-11 | Disposition: A | Discharge status: Home / Self Care | Payer: Medicare Other | Source: Ambulatory Visit | Attending: Family Medicine | Admitting: Family Medicine

## 2013-01-11 DIAGNOSIS — M79609 Pain in unspecified limb: Secondary | ICD-10-CM | POA: Insufficient documentation

## 2013-01-11 DIAGNOSIS — R609 Edema, unspecified: Secondary | ICD-10-CM | POA: Insufficient documentation

## 2013-02-10 ENCOUNTER — Other Ambulatory Visit (HOSPITAL_COMMUNITY): Payer: Self-pay | Admitting: Family Medicine

## 2013-02-10 ENCOUNTER — Ambulatory Visit (HOSPITAL_COMMUNITY)
Admission: RE | Admit: 2013-02-10 | Discharge: 2013-02-10 | Disposition: A | Discharge status: Home / Self Care | Payer: Medicare Other | Source: Ambulatory Visit | Attending: Family Medicine | Admitting: Family Medicine

## 2013-02-10 DIAGNOSIS — M7989 Other specified soft tissue disorders: Secondary | ICD-10-CM | POA: Insufficient documentation

## 2013-02-10 DIAGNOSIS — M159 Polyosteoarthritis, unspecified: Secondary | ICD-10-CM

## 2013-02-10 DIAGNOSIS — M949 Disorder of cartilage, unspecified: Principal | ICD-10-CM

## 2013-02-10 DIAGNOSIS — I739 Peripheral vascular disease, unspecified: Secondary | ICD-10-CM | POA: Insufficient documentation

## 2013-02-10 DIAGNOSIS — M25473 Effusion, unspecified ankle: Secondary | ICD-10-CM | POA: Insufficient documentation

## 2013-02-10 DIAGNOSIS — M899 Disorder of bone, unspecified: Secondary | ICD-10-CM | POA: Insufficient documentation

## 2013-02-10 DIAGNOSIS — M25476 Effusion, unspecified foot: Secondary | ICD-10-CM | POA: Insufficient documentation

## 2013-02-10 DIAGNOSIS — R609 Edema, unspecified: Secondary | ICD-10-CM

## 2013-02-14 ENCOUNTER — Ambulatory Visit (INDEPENDENT_AMBULATORY_CARE_PROVIDER_SITE_OTHER): Payer: Medicare Other | Admitting: Urology

## 2013-02-14 DIAGNOSIS — C61 Malignant neoplasm of prostate: Secondary | ICD-10-CM

## 2013-02-26 DIAGNOSIS — K627 Radiation proctitis: Secondary | ICD-10-CM

## 2013-02-26 DIAGNOSIS — I214 Non-ST elevation (NSTEMI) myocardial infarction: Secondary | ICD-10-CM

## 2013-02-26 HISTORY — DX: Radiation proctitis: K62.7

## 2013-02-26 HISTORY — DX: Non-ST elevation (NSTEMI) myocardial infarction: I21.4

## 2013-03-11 ENCOUNTER — Emergency Department (HOSPITAL_COMMUNITY): Payer: Medicare Other

## 2013-03-11 ENCOUNTER — Inpatient Hospital Stay (HOSPITAL_COMMUNITY)
Admission: EM | Admit: 2013-03-11 | Discharge: 2013-03-23 | DRG: 280 | Disposition: A | Discharge status: Home Health | Payer: Medicare Other | Attending: Internal Medicine | Admitting: Internal Medicine

## 2013-03-11 ENCOUNTER — Encounter (HOSPITAL_COMMUNITY): Payer: Self-pay | Admitting: Emergency Medicine

## 2013-03-11 DIAGNOSIS — K625 Hemorrhage of anus and rectum: Secondary | ICD-10-CM

## 2013-03-11 DIAGNOSIS — K922 Gastrointestinal hemorrhage, unspecified: Secondary | ICD-10-CM | POA: Diagnosis not present

## 2013-03-11 DIAGNOSIS — R079 Chest pain, unspecified: Secondary | ICD-10-CM

## 2013-03-11 DIAGNOSIS — R5381 Other malaise: Secondary | ICD-10-CM

## 2013-03-11 DIAGNOSIS — I2510 Atherosclerotic heart disease of native coronary artery without angina pectoris: Secondary | ICD-10-CM

## 2013-03-11 DIAGNOSIS — R5383 Other fatigue: Secondary | ICD-10-CM

## 2013-03-11 DIAGNOSIS — E119 Type 2 diabetes mellitus without complications: Secondary | ICD-10-CM | POA: Diagnosis present

## 2013-03-11 DIAGNOSIS — Z79899 Other long term (current) drug therapy: Secondary | ICD-10-CM

## 2013-03-11 DIAGNOSIS — Z7982 Long term (current) use of aspirin: Secondary | ICD-10-CM

## 2013-03-11 DIAGNOSIS — Z8672 Personal history of thrombophlebitis: Secondary | ICD-10-CM

## 2013-03-11 DIAGNOSIS — Z7901 Long term (current) use of anticoagulants: Secondary | ICD-10-CM

## 2013-03-11 DIAGNOSIS — Z8673 Personal history of transient ischemic attack (TIA), and cerebral infarction without residual deficits: Secondary | ICD-10-CM

## 2013-03-11 DIAGNOSIS — I10 Essential (primary) hypertension: Secondary | ICD-10-CM | POA: Diagnosis present

## 2013-03-11 DIAGNOSIS — I214 Non-ST elevation (NSTEMI) myocardial infarction: Principal | ICD-10-CM

## 2013-03-11 DIAGNOSIS — G4733 Obstructive sleep apnea (adult) (pediatric): Secondary | ICD-10-CM | POA: Diagnosis present

## 2013-03-11 DIAGNOSIS — Y842 Radiological procedure and radiotherapy as the cause of abnormal reaction of the patient, or of later complication, without mention of misadventure at the time of the procedure: Secondary | ICD-10-CM | POA: Diagnosis present

## 2013-03-11 DIAGNOSIS — I80202 Phlebitis and thrombophlebitis of unspecified deep vessels of left lower extremity: Secondary | ICD-10-CM

## 2013-03-11 DIAGNOSIS — R55 Syncope and collapse: Secondary | ICD-10-CM

## 2013-03-11 DIAGNOSIS — R509 Fever, unspecified: Secondary | ICD-10-CM

## 2013-03-11 DIAGNOSIS — I498 Other specified cardiac arrhythmias: Secondary | ICD-10-CM | POA: Diagnosis present

## 2013-03-11 DIAGNOSIS — M1710 Unilateral primary osteoarthritis, unspecified knee: Secondary | ICD-10-CM | POA: Diagnosis present

## 2013-03-11 DIAGNOSIS — K6289 Other specified diseases of anus and rectum: Secondary | ICD-10-CM | POA: Diagnosis present

## 2013-03-11 DIAGNOSIS — G9341 Metabolic encephalopathy: Secondary | ICD-10-CM | POA: Diagnosis present

## 2013-03-11 DIAGNOSIS — I252 Old myocardial infarction: Secondary | ICD-10-CM

## 2013-03-11 DIAGNOSIS — Z8546 Personal history of malignant neoplasm of prostate: Secondary | ICD-10-CM

## 2013-03-11 DIAGNOSIS — D72829 Elevated white blood cell count, unspecified: Secondary | ICD-10-CM | POA: Diagnosis present

## 2013-03-11 DIAGNOSIS — E2740 Unspecified adrenocortical insufficiency: Secondary | ICD-10-CM

## 2013-03-11 DIAGNOSIS — I4891 Unspecified atrial fibrillation: Secondary | ICD-10-CM

## 2013-03-11 DIAGNOSIS — Z87891 Personal history of nicotine dependence: Secondary | ICD-10-CM

## 2013-03-11 DIAGNOSIS — M109 Gout, unspecified: Secondary | ICD-10-CM | POA: Diagnosis present

## 2013-03-11 DIAGNOSIS — R2681 Unsteadiness on feet: Secondary | ICD-10-CM

## 2013-03-11 DIAGNOSIS — R7301 Impaired fasting glucose: Secondary | ICD-10-CM

## 2013-03-11 DIAGNOSIS — R531 Weakness: Secondary | ICD-10-CM

## 2013-03-11 DIAGNOSIS — C61 Malignant neoplasm of prostate: Secondary | ICD-10-CM

## 2013-03-11 DIAGNOSIS — IMO0002 Reserved for concepts with insufficient information to code with codable children: Secondary | ICD-10-CM | POA: Diagnosis present

## 2013-03-11 DIAGNOSIS — D696 Thrombocytopenia, unspecified: Secondary | ICD-10-CM

## 2013-03-11 DIAGNOSIS — G9340 Encephalopathy, unspecified: Secondary | ICD-10-CM

## 2013-03-11 DIAGNOSIS — N179 Acute kidney failure, unspecified: Secondary | ICD-10-CM

## 2013-03-11 DIAGNOSIS — E785 Hyperlipidemia, unspecified: Secondary | ICD-10-CM

## 2013-03-11 DIAGNOSIS — H7010 Chronic mastoiditis, unspecified ear: Secondary | ICD-10-CM | POA: Diagnosis present

## 2013-03-11 LAB — CBC WITH DIFFERENTIAL/PLATELET
Basophils Absolute: 0.1 10*3/uL (ref 0.0–0.1)
Basophils Relative: 1 % (ref 0–1)
EOS ABS: 0.2 10*3/uL (ref 0.0–0.7)
Eosinophils Relative: 2 % (ref 0–5)
HCT: 38.5 % — ABNORMAL LOW (ref 39.0–52.0)
HEMOGLOBIN: 12.8 g/dL — AB (ref 13.0–17.0)
LYMPHS ABS: 1.3 10*3/uL (ref 0.7–4.0)
Lymphocytes Relative: 12 % (ref 12–46)
MCH: 33.8 pg (ref 26.0–34.0)
MCHC: 33.2 g/dL (ref 30.0–36.0)
MCV: 101.6 fL — ABNORMAL HIGH (ref 78.0–100.0)
MONO ABS: 1.1 10*3/uL — AB (ref 0.1–1.0)
MONOS PCT: 10 % (ref 3–12)
NEUTROS PCT: 76 % (ref 43–77)
Neutro Abs: 8.3 10*3/uL — ABNORMAL HIGH (ref 1.7–7.7)
Platelets: 182 10*3/uL (ref 150–400)
RBC: 3.79 MIL/uL — ABNORMAL LOW (ref 4.22–5.81)
RDW: 14.9 % (ref 11.5–15.5)
WBC: 10.9 10*3/uL — ABNORMAL HIGH (ref 4.0–10.5)

## 2013-03-11 LAB — COMPREHENSIVE METABOLIC PANEL
ALBUMIN: 3 g/dL — AB (ref 3.5–5.2)
ALK PHOS: 73 U/L (ref 39–117)
ALT: 12 U/L (ref 0–53)
AST: 39 U/L — ABNORMAL HIGH (ref 0–37)
BUN: 14 mg/dL (ref 6–23)
CO2: 24 mEq/L (ref 19–32)
Calcium: 10.2 mg/dL (ref 8.4–10.5)
Chloride: 102 mEq/L (ref 96–112)
Creatinine, Ser: 1.08 mg/dL (ref 0.50–1.35)
GFR calc non Af Amer: 63 mL/min — ABNORMAL LOW (ref >90)
GFR, EST AFRICAN AMERICAN: 73 mL/min — AB (ref >90)
GLUCOSE: 161 mg/dL — AB (ref 70–99)
POTASSIUM: 3.4 meq/L — AB (ref 3.7–5.3)
Sodium: 139 mEq/L (ref 137–147)
TOTAL PROTEIN: 6.4 g/dL (ref 6.0–8.3)
Total Bilirubin: 0.5 mg/dL (ref 0.3–1.2)

## 2013-03-11 LAB — URINALYSIS, ROUTINE W REFLEX MICROSCOPIC
BILIRUBIN URINE: NEGATIVE
Glucose, UA: NEGATIVE mg/dL
Hgb urine dipstick: NEGATIVE
LEUKOCYTES UA: NEGATIVE
NITRITE: NEGATIVE
Protein, ur: NEGATIVE mg/dL
SPECIFIC GRAVITY, URINE: 1.02 (ref 1.005–1.030)
UROBILINOGEN UA: 0.2 mg/dL (ref 0.0–1.0)
pH: 6 (ref 5.0–8.0)

## 2013-03-11 LAB — LACTIC ACID, PLASMA: LACTIC ACID, VENOUS: 1.8 mmol/L (ref 0.5–2.2)

## 2013-03-11 LAB — INFLUENZA PANEL BY PCR (TYPE A & B)
H1N1 flu by pcr: NOT DETECTED
INFLAPCR: NEGATIVE
Influenza B By PCR: NEGATIVE

## 2013-03-11 LAB — URIC ACID: URIC ACID, SERUM: 7.5 mg/dL (ref 4.0–7.8)

## 2013-03-11 LAB — TROPONIN I: TROPONIN I: 3.95 ng/mL — AB (ref <0.30)

## 2013-03-11 MED ORDER — SODIUM CHLORIDE 0.9 % IV SOLN
Freq: Once | INTRAVENOUS | Status: AC
  Administered 2013-03-11: 250 mL via INTRAVENOUS

## 2013-03-11 MED ORDER — ASPIRIN 300 MG RE SUPP
RECTAL | Status: AC
  Administered 2013-03-11: 300 mg
  Filled 2013-03-11: qty 1

## 2013-03-11 MED ORDER — LEVOFLOXACIN IN D5W 500 MG/100ML IV SOLN
500.0000 mg | INTRAVENOUS | Status: DC
  Administered 2013-03-11: 500 mg via INTRAVENOUS
  Filled 2013-03-11: qty 100

## 2013-03-11 MED ORDER — POTASSIUM CHLORIDE CRYS ER 20 MEQ PO TBCR: 40.0000 meq | EXTENDED_RELEASE_TABLET | Freq: Once | ORAL | Status: DC

## 2013-03-11 MED ORDER — ASPIRIN 300 MG RE SUPP
RECTAL | Status: AC
  Filled 2013-03-11: qty 1

## 2013-03-11 MED ORDER — ACETAMINOPHEN 650 MG RE SUPP
RECTAL | Status: AC
  Administered 2013-03-11: 650 mg via RECTAL
  Filled 2013-03-11: qty 1

## 2013-03-11 MED ORDER — POTASSIUM CHLORIDE 10 MEQ/100ML IV SOLN
10.0000 meq | Freq: Once | INTRAVENOUS | Status: AC
  Administered 2013-03-11: 10 meq via INTRAVENOUS
  Filled 2013-03-11: qty 100

## 2013-03-11 NOTE — ED Notes (Signed)
Pt presents to er by EMS with family for further evaluation of fall, family reports that pt had an assisted fall to the floor in the bathroom, pt states that he hit his head, family denies that he hit his head, pt confused, family reports that the confusion started today, pt c/o pain to multiple areas, Dr. Betsey Holiday in prior to RN, see edp assessment for further, family requesting pt blood work to be "checked" for gout. States "yall are getting his blood anyway you can have it checked while we are here" Dr. Betsey Holiday notified of family request.

## 2013-03-11 NOTE — ED Notes (Signed)
Per EMS, patient had assisted fall; family denies patient hit his head.  Patient can answer questions, but is very drowsy.  Patient c/o right elbow and right knee pain per EMS.

## 2013-03-11 NOTE — ED Notes (Signed)
Awake briefly,more alert, knows person/place." I'm here for my heart". Denies pain

## 2013-03-11 NOTE — H&P (Addendum)
PCP:   Leonides Grills, MD   Chief Complaint:  Altered mental status  HPI: 80 year old male who   has a past medical history of Hypertension; Gout; Hyperlipidemia; Deep vein thrombophlebitis of left leg (11/2010); Fasting hyperglycemia; Stroke (08/2009; 07/2010); H/O heat stroke (08/2009); Prostate cancer (2013); Renal cyst; Syncope; PAF (paroxysmal atrial fibrillation); and CAD (coronary artery disease). Today presented to the ED with chief complaint of altered mental status, as per family patient became confused and had an assisted fall to the floor in the bathroom. Patient is somnolent but arousable, patient's brother who is the healthcare power of attorney at bedside provides most of the history. Patient denies chest pain, no shortness of breath, but felt weak on walking to the bathroom. Denies syncope. CT head  done shows chronic partial right mastoid opacification,? Chronic mastoiditis in the ED He had a temp of 101.5 in the ED. As per brother patient has been having runny nose for past one week. Patient was found to have elevated troponin of 3.95. Patient has history of CAD, he had left heart cath in March 2014 which showed nonobstructive CAD with preserved EF. Medical management was recommended at that time.  Allergies:   Allergies  Allergen Reactions  . Tetanus Toxoids Swelling    Also allergic to "high powered pain medications."      Past Medical History  Diagnosis Date  . Hypertension   . Gout     Acute episode involving ankle in 12/2011  . Hyperlipidemia   . Deep vein thrombophlebitis of left leg 11/2010    LLE; S/P THA  . Fasting hyperglycemia   . Stroke 08/2009; 07/2010    TIA in 07/2010  . H/O heat stroke 08/2009  . Prostate cancer 2013    adenocarcinoma; Gleason grade 8; PSA 9.45; EBRT 2013-14  . Renal cyst   . Syncope     01/2012: Negative pharmacologic stress nuclear  . PAF (paroxysmal atrial fibrillation)     a. first noted 01/2012 during admission for URI;  b. seen  again 03/2012 after nstemi of unknown origin - pradaxa initiated.  Marland Kitchen CAD (coronary artery disease)     a. 01/2012 Elev Ti-->nonischemic MV;  b. 03/2012 NSTEMI/Cath: LM 50, LAD 50p/m, 60md, LCX nl, RI 60ost sup branch, RCA diff nonobs, EF 55%    Past Surgical History  Procedure Laterality Date  . Hip arthroplasty  12/08/2010    ARTHROPLASTY BIPOLAR HIP;  Surgeon-Olin; Left  . 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 I&d extremity  05/28/2011    Procedure: IRRIGATION AND DEBRIDEMENT EXTREMITY;  Surgeon: FLinna Hoff MD;  [facility]  Service: Orthopedics;  Laterality: Right;  I & D Right hand/wrist  . Colonoscopy   07/18/2003    RMR: Rectum internal hemorrhoids.  Otherwise, normal rectum/ Left-sided diverticula.  Remainder of colonic mucosa-nl    Prior to Admission medications   Medication Sig Start Date End Date Taking? Authorizing Provider  allopurinol (ZYLOPRIM) 300 MG tablet Take 300 mg by mouth daily.   Yes Historical Provider, MD  ascorbic acid (VITAMIN C) 500 MG tablet Take 1,000 mg by mouth every morning.    Yes Historical Provider, MD  aspirin EC 81 MG tablet Take 81 mg by mouth daily.   Yes Historical Provider, MD  calcium-vitamin D (OSCAL WITH D) 500-200 MG-UNIT per tablet Take 1 tablet by mouth every morning.    Yes Historical Provider, MD  cholecalciferol (  VITAMIN D) 1000 UNITS tablet Take 1,000 Units by mouth daily.   Yes Historical Provider, MD  COD LIVER OIL PO Take 2 tablets by mouth 2 (two) times daily.    Yes Historical Provider, MD  dabigatran (PRADAXA) 150 MG CAPS capsule Take 150 mg by mouth daily.   Yes Historical Provider, MD  diltiazem (CARDIZEM CD) 180 MG 24 hr capsule Take 180 mg by mouth daily.   Yes Historical Provider, MD  glipiZIDE (GLUCOTROL XL) 2.5 MG 24 hr tablet Take 2.5 mg by mouth daily.    Yes Historical Provider, MD  metoprolol (LOPRESSOR) 50 MG tablet Take 50 mg by mouth 2 (two) times daily.   Yes Historical  Provider, MD  Multiple Vitamin (MULITIVITAMIN WITH MINERALS) TABS Take 1 tablet by mouth every morning.    Yes Historical Provider, MD  Omega-3 Fatty Acids (FISH OIL PO) Take 1 capsule by mouth daily.   Yes Historical Provider, MD  simvastatin (ZOCOR) 20 MG tablet Take 20 mg by mouth at bedtime.    Yes Historical Provider, MD  nitroGLYCERIN (NITROSTAT) 0.4 MG SL tablet Place 0.4 mg under the tongue every 5 (five) minutes as needed for chest pain.    Historical Provider, MD  predniSONE (DELTASONE) 10 MG tablet Take 1 tablet (10 mg total) by mouth daily as needed (severe gout.). Hold this while you are on higher dose prednisone taper 12/02/12   Ripudeep Krystal Eaton, MD    Social History:  reports that he quit smoking about 65 years ago. His smoking use included Cigarettes and Cigars. He smoked 0.00 packs per day. His smokeless tobacco use includes Chew. He reports that he does not drink alcohol or use illicit drugs.  Family History  Problem Relation Age of Onset  . Prostate cancer Brother   . Colon cancer Sister 33  . Diabetes Mellitus II Father   . Stroke Father      All the positives are listed in BOLD  Review of Systems:  HEENT: Headache, blurred vision, runny nose, sore throat Neck: Hypothyroidism, hyperthyroidism,,lymphadenopathy Chest : Shortness of breath, history of COPD, Asthma Heart : Chest pain, history of coronary arterey disease GI:  Nausea, vomiting, diarrhea, constipation, GERD GU: Dysuria, urgency, frequency of urination, hematuria Neuro: Stroke, seizures, syncope Psych: Depression, anxiety, hallucinations   Physical Exam: Blood pressure 124/48, pulse 71, temperature 101.5 F (38.6 C), temperature source Rectal, resp. rate 14, SpO2 91.00%. Constitutional:   Patient is a well-developed and well-nourished male* in no acute distress and cooperative with exam. Head: Normocephalic and atraumatic Mouth: Mucus membranes moist Eyes: PERRL, EOMI, conjunctivae normal Neck:  Supple, No Thyromegaly Cardiovascular: RRR, S1 normal, S2 normal Pulmonary/Chest: CTAB, no wheezes, rales, or rhonchi Abdominal: Soft. Non-tender, non-distended, bowel sounds are normal, no masses, organomegaly, or guarding present.  Neurological:  somnolent but arousable , moving all extremities  Extremities : No Cyanosis, Clubbing or Edema   Labs on Admission:  Results for orders placed during the hospital encounter of 03/11/13 (from the past 48 hour(s))  CBC WITH DIFFERENTIAL     Status: Abnormal   Collection Time    03/11/13  6:28 PM      Result Value Ref Range   WBC 10.9 (*) 4.0 - 10.5 K/uL   RBC 3.79 (*) 4.22 - 5.81 MIL/uL   Hemoglobin 12.8 (*) 13.0 - 17.0 g/dL   HCT 38.5 (*) 39.0 - 52.0 %   MCV 101.6 (*) 78.0 - 100.0 fL   MCH 33.8  26.0 - 34.0 pg  MCHC 33.2  30.0 - 36.0 g/dL   RDW 14.9  11.5 - 15.5 %   Platelets 182  150 - 400 K/uL   Neutrophils Relative % 76  43 - 77 %   Neutro Abs 8.3 (*) 1.7 - 7.7 K/uL   Lymphocytes Relative 12  12 - 46 %   Lymphs Abs 1.3  0.7 - 4.0 K/uL   Monocytes Relative 10  3 - 12 %   Monocytes Absolute 1.1 (*) 0.1 - 1.0 K/uL   Eosinophils Relative 2  0 - 5 %   Eosinophils Absolute 0.2  0.0 - 0.7 K/uL   Basophils Relative 1  0 - 1 %   Basophils Absolute 0.1  0.0 - 0.1 K/uL  COMPREHENSIVE METABOLIC PANEL     Status: Abnormal   Collection Time    03/11/13  6:28 PM      Result Value Ref Range   Sodium 139  137 - 147 mEq/L   Potassium 3.4 (*) 3.7 - 5.3 mEq/L   Chloride 102  96 - 112 mEq/L   CO2 24  19 - 32 mEq/L   Glucose, Bld 161 (*) 70 - 99 mg/dL   BUN 14  6 - 23 mg/dL   Creatinine, Ser 1.08  0.50 - 1.35 mg/dL   Calcium 10.2  8.4 - 10.5 mg/dL   Total Protein 6.4  6.0 - 8.3 g/dL   Albumin 3.0 (*) 3.5 - 5.2 g/dL   AST 39 (*) 0 - 37 U/L   ALT 12  0 - 53 U/L   Alkaline Phosphatase 73  39 - 117 U/L   Total Bilirubin 0.5  0.3 - 1.2 mg/dL   GFR calc non Af Amer 63 (*) >90 mL/min   GFR calc Af Amer 73 (*) >90 mL/min   Comment: (NOTE)      The eGFR has been calculated using the CKD EPI equation.     This calculation has not been validated in all clinical situations.     eGFR's persistently <90 mL/min signify possible Chronic Kidney     Disease.  TROPONIN I     Status: Abnormal   Collection Time    03/11/13  6:28 PM      Result Value Ref Range   Troponin I 3.95 (*) <0.30 ng/mL   Comment:            Due to the release kinetics of cTnI,     a negative result within the first hours     of the onset of symptoms does not rule out     myocardial infarction with certainty.     If myocardial infarction is still suspected,     repeat the test at appropriate intervals.     CRITICAL RESULT CALLED TO, READ BACK BY AND VERIFIED WITH:     M.POINDEXTER AT 1908 ON 03/11/13 BY S.VANHOORNE  URIC ACID     Status: None   Collection Time    03/11/13  7:09 PM      Result Value Ref Range   Uric Acid, Serum 7.5  4.0 - 7.8 mg/dL  LACTIC ACID, PLASMA     Status: None   Collection Time    03/11/13  8:04 PM      Result Value Ref Range   Lactic Acid, Venous 1.8  0.5 - 2.2 mmol/L  URINALYSIS, ROUTINE W REFLEX MICROSCOPIC     Status: Abnormal   Collection Time    03/11/13  8:11 PM  Result Value Ref Range   Color, Urine YELLOW  YELLOW   APPearance CLEAR  CLEAR   Specific Gravity, Urine 1.020  1.005 - 1.030   pH 6.0  5.0 - 8.0   Glucose, UA NEGATIVE  NEGATIVE mg/dL   Hgb urine dipstick NEGATIVE  NEGATIVE   Bilirubin Urine NEGATIVE  NEGATIVE   Ketones, ur TRACE (*) NEGATIVE mg/dL   Protein, ur NEGATIVE  NEGATIVE mg/dL   Urobilinogen, UA 0.2  0.0 - 1.0 mg/dL   Nitrite NEGATIVE  NEGATIVE   Leukocytes, UA NEGATIVE  NEGATIVE   Comment: MICROSCOPIC NOT DONE ON URINES WITH NEGATIVE PROTEIN, BLOOD, LEUKOCYTES, NITRITE, OR GLUCOSE <1000 mg/dL.    Radiological Exams on Admission: Dg Pelvis 1-2 Views  03/11/2013   CLINICAL DATA:  Fall  EXAM: PELVIS - 1-2 VIEW  COMPARISON:  None.  FINDINGS: There is no evidence of pelvic fracture or diastasis.  No other pelvic bone lesions are seen. Suboptimal visualization due to body habitus and technique. Clips project over the pelvis. Left total hip arthroplasty is grossly unremarkable but incompletely visualized.  IMPRESSION: No displaced pelvic fracture allowing for technique as above.   Electronically Signed   By: Conchita Paris M.D.   On: 03/11/2013 19:22   Dg Elbow Complete Left  03/11/2013   CLINICAL DATA:  Fall  EXAM: LEFT ELBOW - COMPLETE 3+ VIEW  COMPARISON:  None.  FINDINGS: There is no evidence of fracture, dislocation, or joint effusion. There is no evidence of arthropathy or other focal bone abnormality. Soft tissues are unremarkable. The patient was unable to cooperate for optimal positioning.  IMPRESSION: Negative.   Electronically Signed   By: Conchita Paris M.D.   On: 03/11/2013 19:21   Dg Elbow Complete Right  03/11/2013   CLINICAL DATA:  Fall  EXAM: RIGHT ELBOW - COMPLETE 3+ VIEW  COMPARISON:  None.  FINDINGS: There is no evidence of fracture, dislocation, or joint effusion. There is no evidence of arthropathy or other focal bone abnormality. Soft tissues are unremarkable. The patient was unable to be properly positioned for all views.  IMPRESSION: Negative.   Electronically Signed   By: Conchita Paris M.D.   On: 03/11/2013 19:22   Ct Head Wo Contrast  03/11/2013   CLINICAL DATA:  Fall, altered mental status and headache  EXAM: CT HEAD WITHOUT CONTRAST  TECHNIQUE: Contiguous axial images were obtained from the base of the skull through the vertex without intravenous contrast.  COMPARISON:  Prior CT scan of the head 11/27/2012  FINDINGS: Negative for acute intracranial hemorrhage, acute infarction, mass, mass effect, hydrocephalus or midline shift. Gray-white differentiation is preserved throughout. No acute soft tissue or calvarial abnormality. Stable cerebral and cerebellar volume loss with compensatory ex vacuo dilatation of the lateral ventricles. Mild periventricular white matter  hypoattenuation consistent with longstanding microvascular ischemic white matter changes. Chronic partial right mastoid effusion. The left mastoid air cells are well aerated. Partial opacification of the left sphenoid sinus. This is a new finding compared to prior. Atherosclerotic calcification in the bilateral cavernous and supra clinoid carotid arteries.  IMPRESSION: 1. No acute intracranial abnormality. 2. Developing partial opacification of the left sphenoid sinus consistent with inflammatory paranasal sinus disease. 3. Chronic partial right mastoid opacification. Query chronic mastoiditis? 4. Stable cerebral and cerebellar atrophy, mild chronic ischemic white matter changes and intracranial atherosclerosis.   Electronically Signed   By: Jacqulynn Cadet M.D.   On: 03/11/2013 19:34   Dg Chest Port 1 View  03/11/2013  CLINICAL DATA:  Non ST elevation myocardial infarction  EXAM: PORTABLE CHEST - 1 VIEW  COMPARISON:  Prior chest x-ray 11/06/2012  FINDINGS: Stable cardiac and mediastinal contours with mild cardiomegaly. Atherosclerotic calcification noted in the transverse aorta. Negative for pulmonary edema. No pneumothorax, focal airspace consolidation or pleural effusion. No acute osseous abnormality.  IMPRESSION: Stable cardiomegaly without evidence of failure.   Electronically Signed   By: Jacqulynn Cadet M.D.   On: 03/11/2013 19:56   Dg Knee Complete 4 Views Right  03/11/2013   CLINICAL DATA:  Fall  EXAM: RIGHT KNEE - COMPLETE 4+ VIEW  COMPARISON:  None.  FINDINGS: There is no evidence of fracture, dislocation, or joint effusion. There is no evidence of arthropathy or other focal bone abnormality. Soft tissues are unremarkable. Tricompartmental degenerative change is noted. Detail is obscured by patient inability to remain still for the examination. Vascular calcifications are noted. No suprapatellar effusion.  IMPRESSION: Negative.   Electronically Signed   By: Conchita Paris M.D.   On: 03/11/2013  19:21    Assessment/Plan Principal Problem:   NSTEMI (non-ST elevated myocardial infarction) Active Problems:   Hypertension   Atrial fibrillation   CAD (coronary artery disease)  Non-STEMI  ED physician Dr. Betsey Holiday called and discussed with cardiologist on call Dr. Percival Spanish, who did not recommend starting heparin at this time. He recommended to continue medical management and full dose anticoagulation with Pradaxa. Patient is full code, I called and discussed with cardiologist, at this time patient will be transferred to [facility] for [facility] evaluation. Cardiology will be consulted in the a.m. Will follow serial cardiac enzymes. Continue aspirin, Lopressor, Zocor.  Altered mental status ? Cause, though improving as patient is able to answer questions. UA is negative, CXR shows no infiltrate. Will continue to monitor the patient.  Atrial fibrillation Heart rate is controlled with Lopressor and Cardizem. Continue full dose anticoagulation  Hypertension Blood pressure is stable, continue Lopressor and Cardizem  Fever Patient had temp of 101.5, CT scan shows mastoiditis, will start the patient on Levaquin. Influenza PCR has been obtained we'll follow the results.  Diabetes mellitus Continue sliding scale insulin. Hold Glucotrol XL at this time.  Code status:patient is full code   Family discussion: discussed with patient's brother who is the healthcare power of attorney at bedside.   I have called and discussed with the accepting physician Dr. Mart Piggs, patient will be admitted to step down unit.  Time Spent on Admission:  75 minutes  [facility] Pager: [phone] 03/11/2013, 9:39 PM  If 7PM-7AM, please contact night-coverage  [email or web address]  Password [email or web address]

## 2013-03-11 NOTE — ED Provider Notes (Signed)
CSN: [id number]     Arrival date & time 03/11/13  1807 History   First MD Initiated Contact with Patient 03/11/13 1807     Chief Complaint  Patient presents with  . Fall     (Consider location/radiation/quality/duration/timing/severity/associated sxs/prior Treatment) HPI Comments: Patient brought to the ER for evaluation after a fall. Patient arrives from home by EMS. EMS reports that the family stated that the patient became very somnolent tonight after taking his nighttime meds. It was speculated that it might have mixed up the morning and nighttime medications. Patient reportedly was walking and lost his balance, had an "assisted fall" where he was helped to the ground. Family reported that he did not hit his head the patient had been complaining of right knee and bilateral elbow pain since the event.  Arrival to the ER, patient is somnolent, but does awaken to voice. He answers questions appropriately.  Patient is a 80 y.o. male presenting with fall.  Fall    Past Medical History  Diagnosis Date  . Hypertension   . Gout     Acute episode involving ankle in 12/2011  . Hyperlipidemia   . Deep vein thrombophlebitis of left leg 11/2010    LLE; S/P THA  . Fasting hyperglycemia   . Stroke 08/2009; 07/2010    TIA in 07/2010  . H/O heat stroke 08/2009  . Prostate cancer 2013    adenocarcinoma; Gleason grade 8; PSA 9.45; EBRT 2013-14  . Renal cyst   . Syncope     01/2012: Negative pharmacologic stress nuclear  . PAF (paroxysmal atrial fibrillation)     a. first noted 01/2012 during admission for URI;  b. seen again 03/2012 after nstemi of unknown origin - pradaxa initiated.  Marland Kitchen CAD (coronary artery disease)     a. 01/2012 Elev Ti-->nonischemic MV;  b. 03/2012 NSTEMI/Cath: LM 50, LAD 50p/m, 35m/d, LCX nl, RI 60ost sup branch, RCA diff nonobs, EF 55%   Past Surgical History  Procedure Laterality Date  . Hip arthroplasty  12/08/2010    ARTHROPLASTY BIPOLAR HIP;  Surgeon-Olin; Left  .  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 I&d extremity  05/28/2011    Procedure: IRRIGATION AND DEBRIDEMENT EXTREMITY;  Surgeon: Linna Hoff, MD;  Location: [facility];  Service: Orthopedics;  Laterality: Right;  I & D Right hand/wrist  . Colonoscopy   07/18/2003    RMR: Rectum internal hemorrhoids.  Otherwise, normal rectum/ Left-sided diverticula.  Remainder of colonic mucosa-nl   Family History  Problem Relation Age of Onset  . Prostate cancer Brother   . Colon cancer Sister 71  . Diabetes Mellitus II Father   . Stroke Father    History  Substance Use Topics  . Smoking status: Former Smoker    Types: Cigarettes, Cigars    Quit date: 01/27/1948  . Smokeless tobacco: Current User    Types: Chew     Comment: "quit smoking cigarettes age 78 or 79"  . Alcohol Use: No    Review of Systems  Musculoskeletal: Positive for arthralgias.  Psychiatric/Behavioral: Positive for confusion.  All other systems reviewed and are negative.      Allergies  Tetanus toxoids  Home Medications   Current Outpatient Rx  Name  Route  Sig  Dispense  Refill  . allopurinol (ZYLOPRIM) 300 MG tablet   Oral   Take 300 mg by mouth daily.         Marland Kitchen  ascorbic acid (VITAMIN C) 500 MG tablet   Oral   Take 1,000 mg by mouth every morning.          Marland Kitchen aspirin EC 81 MG tablet   Oral   Take 81 mg by mouth daily.         . calcium-vitamin D (OSCAL WITH D) 500-200 MG-UNIT per tablet   Oral   Take 1 tablet by mouth every morning.          . cholecalciferol (VITAMIN D) 1000 UNITS tablet   Oral   Take 1,000 Units by mouth daily.         . COD LIVER OIL PO   Oral   Take 2 tablets by mouth 2 (two) times daily.          . dabigatran (PRADAXA) 150 MG CAPS capsule   Oral   Take 150 mg by mouth daily.         Marland Kitchen diltiazem (CARDIZEM CD) 180 MG 24 hr capsule   Oral   Take 180 mg by mouth daily.         Marland Kitchen glipiZIDE (GLUCOTROL XL) 2.5 MG 24 hr tablet    Oral   Take 2.5 mg by mouth daily.          . metoprolol (LOPRESSOR) 50 MG tablet   Oral   Take 50 mg by mouth 2 (two) times daily.         . Multiple Vitamin (MULITIVITAMIN WITH MINERALS) TABS   Oral   Take 1 tablet by mouth every morning.          . Omega-3 Fatty Acids (FISH OIL PO)   Oral   Take 1 capsule by mouth daily.         . simvastatin (ZOCOR) 20 MG tablet   Oral   Take 20 mg by mouth at bedtime.          . nitroGLYCERIN (NITROSTAT) 0.4 MG SL tablet   Sublingual   Place 0.4 mg under the tongue every 5 (five) minutes as needed for chest pain.         . predniSONE (DELTASONE) 10 MG tablet   Oral   Take 1 tablet (10 mg total) by mouth daily as needed (severe gout.). Hold this while you are on higher dose prednisone taper          BP 124/48  Pulse 71  Temp(Src) 101.5 F (38.6 C) (Rectal)  Resp 14  SpO2 91% Physical Exam  Constitutional: He is oriented to person, place, and time. He appears well-developed and well-nourished. No distress.  Somnolent, moaning and grunting  HENT:  Head: Normocephalic and atraumatic.  Right Ear: Hearing normal.  Left Ear: Hearing normal.  Nose: Nose normal.  Mouth/Throat: Oropharynx is clear and moist and mucous membranes are normal.  Eyes: Conjunctivae and EOM are normal. Pupils are equal, round, and reactive to light.  Neck: Normal range of motion. Neck supple.  Cardiovascular: Regular rhythm, S1 normal and S2 normal.  Exam reveals no gallop and no friction rub.   No murmur heard. Pulmonary/Chest: Effort normal and breath sounds normal. No respiratory distress. He exhibits no tenderness.  Abdominal: Soft. Normal appearance and bowel sounds are normal. There is no hepatosplenomegaly. There is no tenderness. There is no rebound, no guarding, no tenderness at McBurney's point and negative Murphy's sign. No hernia.  Musculoskeletal: Normal range of motion.       Right elbow: He exhibits no swelling, no effusion and  no  deformity. Tenderness found.       Left elbow: He exhibits no swelling, no effusion and no deformity. Tenderness found.       Right knee: He exhibits no swelling, no effusion and no deformity. Tenderness found.  Neurological: He is oriented to person, place, and time. He has normal strength. No cranial nerve deficit or sensory deficit. Coordination normal. GCS eye subscore is 4. GCS verbal subscore is 5. GCS motor subscore is 6.  Somnolent, awakens to voice and answers questions  Skin: Skin is warm, dry and intact. No rash noted. No cyanosis.  Psychiatric: His speech is normal.    ED Course  Procedures (including critical care time) Labs Review Labs Reviewed  CBC WITH DIFFERENTIAL - Abnormal; Notable for the following:    WBC 10.9 (*)    RBC 3.79 (*)    Hemoglobin 12.8 (*)    HCT 38.5 (*)    MCV 101.6 (*)    Neutro Abs 8.3 (*)    Monocytes Absolute 1.1 (*)    All other components within normal limits  COMPREHENSIVE METABOLIC PANEL - Abnormal; Notable for the following:    Potassium 3.4 (*)    Glucose, Bld 161 (*)    Albumin 3.0 (*)    AST 39 (*)    GFR calc non Af Amer 63 (*)    GFR calc Af Amer 73 (*)    All other components within normal limits  TROPONIN I - Abnormal; Notable for the following:    Troponin I 3.95 (*)    All other components within normal limits  URINALYSIS, ROUTINE W REFLEX MICROSCOPIC - Abnormal; Notable for the following:    Ketones, ur TRACE (*)    All other components within normal limits  CULTURE, BLOOD (ROUTINE X 2)  CULTURE, BLOOD (ROUTINE X 2)  URIC ACID  LACTIC ACID, PLASMA  INFLUENZA PANEL BY PCR (TYPE A & B, H1N1)   Imaging Review Dg Pelvis 1-2 Views  03/11/2013   CLINICAL DATA:  Fall  EXAM: PELVIS - 1-2 VIEW  COMPARISON:  None.  FINDINGS: There is no evidence of pelvic fracture or diastasis. No other pelvic bone lesions are seen. Suboptimal visualization due to body habitus and technique. Clips project over the pelvis. Left total hip  arthroplasty is grossly unremarkable but incompletely visualized.  IMPRESSION: No displaced pelvic fracture allowing for technique as above.   Electronically Signed   By: Conchita Paris M.D.   On: 03/11/2013 19:22   Dg Elbow Complete Left  03/11/2013   CLINICAL DATA:  Fall  EXAM: LEFT ELBOW - COMPLETE 3+ VIEW  COMPARISON:  None.  FINDINGS: There is no evidence of fracture, dislocation, or joint effusion. There is no evidence of arthropathy or other focal bone abnormality. Soft tissues are unremarkable. The patient was unable to cooperate for optimal positioning.  IMPRESSION: Negative.   Electronically Signed   By: Conchita Paris M.D.   On: 03/11/2013 19:21   Dg Elbow Complete Right  03/11/2013   CLINICAL DATA:  Fall  EXAM: RIGHT ELBOW - COMPLETE 3+ VIEW  COMPARISON:  None.  FINDINGS: There is no evidence of fracture, dislocation, or joint effusion. There is no evidence of arthropathy or other focal bone abnormality. Soft tissues are unremarkable. The patient was unable to be properly positioned for all views.  IMPRESSION: Negative.   Electronically Signed   By: Conchita Paris M.D.   On: 03/11/2013 19:22   Ct Head Wo Contrast  03/11/2013  CLINICAL DATA:  Fall, altered mental status and headache  EXAM: CT HEAD WITHOUT CONTRAST  TECHNIQUE: Contiguous axial images were obtained from the base of the skull through the vertex without intravenous contrast.  COMPARISON:  Prior CT scan of the head 11/27/2012  FINDINGS: Negative for acute intracranial hemorrhage, acute infarction, mass, mass effect, hydrocephalus or midline shift. Gray-white differentiation is preserved throughout. No acute soft tissue or calvarial abnormality. Stable cerebral and cerebellar volume loss with compensatory ex vacuo dilatation of the lateral ventricles. Mild periventricular white matter hypoattenuation consistent with longstanding microvascular ischemic white matter changes. Chronic partial right mastoid effusion. The left mastoid  air cells are well aerated. Partial opacification of the left sphenoid sinus. This is a new finding compared to prior. Atherosclerotic calcification in the bilateral cavernous and supra clinoid carotid arteries.  IMPRESSION: 1. No acute intracranial abnormality. 2. Developing partial opacification of the left sphenoid sinus consistent with inflammatory paranasal sinus disease. 3. Chronic partial right mastoid opacification. Query chronic mastoiditis? 4. Stable cerebral and cerebellar atrophy, mild chronic ischemic white matter changes and intracranial atherosclerosis.   Electronically Signed   By: Jacqulynn Cadet M.D.   On: 03/11/2013 19:34   Dg Chest Port 1 View  03/11/2013   CLINICAL DATA:  Non ST elevation myocardial infarction  EXAM: PORTABLE CHEST - 1 VIEW  COMPARISON:  Prior chest x-ray 11/06/2012  FINDINGS: Stable cardiac and mediastinal contours with mild cardiomegaly. Atherosclerotic calcification noted in the transverse aorta. Negative for pulmonary edema. No pneumothorax, focal airspace consolidation or pleural effusion. No acute osseous abnormality.  IMPRESSION: Stable cardiomegaly without evidence of failure.   Electronically Signed   By: Jacqulynn Cadet M.D.   On: 03/11/2013 19:56   Dg Knee Complete 4 Views Right  03/11/2013   CLINICAL DATA:  Fall  EXAM: RIGHT KNEE - COMPLETE 4+ VIEW  COMPARISON:  None.  FINDINGS: There is no evidence of fracture, dislocation, or joint effusion. There is no evidence of arthropathy or other focal bone abnormality. Soft tissues are unremarkable. Tricompartmental degenerative change is noted. Detail is obscured by patient inability to remain still for the examination. Vascular calcifications are noted. No suprapatellar effusion.  IMPRESSION: Negative.   Electronically Signed   By: Conchita Paris M.D.   On: 03/11/2013 19:21    EKG Interpretation   None       MDM   Final diagnoses:  NSTEMI (non-ST elevated myocardial infarction)  Febrile illness     Patient is a complicated patient who was initially brought to the ER his fall. He apparently stumbled this evening and had a near fall, was late to the ground. He didn't land on his knees had been complaining of knee pain. Reports that he acutely became confused this evening. They're concerned that he might have given him the wrong medicines. He does not really have any narcotic medicine to explain the mental status changes.  Patient is currently on Pradaxa for a history of atrial fibrillation. With this history and her recent fall, CT scan was ordered to further evaluate for intracranial injury and bleeding. X-rays of his extremities were also ordered. CAT scan and x-rays were negative.  Blood work was ordered and the patient had EKG performed. EKG is unremarkable compared to previous. His troponin, however, did come back markedly elevated. We his recent records reveals that he had a similar presentation in March 2014 where he had an elevated troponin but cardiac catheterization at that time showed nonobstructive disease.  Based on the elevated troponin,  case was discussed with Doctor Hochrein. He confirmed that the patient would not require any repeat intervention. He also confirmed that the treatment would be to continue Paxil, but not overlap with heparin or other anticoagulations. He recommended to evaluate the patient for concomitant illness that is likely driving his troponin, as he did not feel this was a primary cardiac event.  The patient did spike a temperature to 101.5. He does not appear to be septic. Lactate is not elevated. He does have a cough, chest x-ray was performed. No evidence of pneumonia is seen. Urinalysis does not suggest infection. Blood and urine cultures are pending. Influenza swab has been obtained. I do not feel the patient requires empiric antibiotics at this time, as there is no obvious source for the fever and he does not appear to be septic. Await influenza  swab.    Orpah Greek, MD 03/11/13 [phone]

## 2013-03-11 NOTE — ED Notes (Signed)
Pt restless grabbing at IV site where potassium is infusing, dr Darrick Meigs contacted and IV NS hung @ 250 hr to dilute Potassium IV

## 2013-03-11 NOTE — ED Notes (Signed)
CRITICAL VALUE ALERT  Critical value received:  Trop 3.95  Date of notification:  03/11/2013  Time of notification:  19:10 Critical value read back: yes  Nurse who received alert:  Luis Abed   MD notified (1st page):  Dr. Betsey Holiday  Time of first page:  19:10 MD notified (2nd page):  Time of second page:  Responding MD:  Dr Betsey Holiday  Time MD responded:  19:10

## 2013-03-12 DIAGNOSIS — M109 Gout, unspecified: Secondary | ICD-10-CM

## 2013-03-12 DIAGNOSIS — N179 Acute kidney failure, unspecified: Secondary | ICD-10-CM

## 2013-03-12 DIAGNOSIS — G9340 Encephalopathy, unspecified: Secondary | ICD-10-CM

## 2013-03-12 DIAGNOSIS — I517 Cardiomegaly: Secondary | ICD-10-CM

## 2013-03-12 LAB — GLUCOSE, CAPILLARY
GLUCOSE-CAPILLARY: 84 mg/dL (ref 70–99)
GLUCOSE-CAPILLARY: 92 mg/dL (ref 70–99)
Glucose-Capillary: 85 mg/dL (ref 70–99)
Glucose-Capillary: 91 mg/dL (ref 70–99)

## 2013-03-12 LAB — COMPREHENSIVE METABOLIC PANEL
ALBUMIN: 2.7 g/dL — AB (ref 3.5–5.2)
ALT: 11 U/L (ref 0–53)
AST: 43 U/L — AB (ref 0–37)
Alkaline Phosphatase: 61 U/L (ref 39–117)
BUN: 18 mg/dL (ref 6–23)
CALCIUM: 9.9 mg/dL (ref 8.4–10.5)
CO2: 21 mEq/L (ref 19–32)
Chloride: 109 mEq/L (ref 96–112)
Creatinine, Ser: 1.09 mg/dL (ref 0.50–1.35)
GFR calc Af Amer: 73 mL/min — ABNORMAL LOW (ref >90)
GFR calc non Af Amer: 63 mL/min — ABNORMAL LOW (ref >90)
Glucose, Bld: 85 mg/dL (ref 70–99)
Potassium: 3.7 mEq/L (ref 3.7–5.3)
SODIUM: 144 meq/L (ref 137–147)
TOTAL PROTEIN: 5.5 g/dL — AB (ref 6.0–8.3)
Total Bilirubin: 0.5 mg/dL (ref 0.3–1.2)

## 2013-03-12 LAB — CBC
HCT: 33.7 % — ABNORMAL LOW (ref 39.0–52.0)
Hemoglobin: 11.2 g/dL — ABNORMAL LOW (ref 13.0–17.0)
MCH: 33.8 pg (ref 26.0–34.0)
MCHC: 33.2 g/dL (ref 30.0–36.0)
MCV: 101.8 fL — ABNORMAL HIGH (ref 78.0–100.0)
PLATELETS: 148 10*3/uL — AB (ref 150–400)
RBC: 3.31 MIL/uL — ABNORMAL LOW (ref 4.22–5.81)
RDW: 15 % (ref 11.5–15.5)
WBC: 11.2 10*3/uL — ABNORMAL HIGH (ref 4.0–10.5)

## 2013-03-12 LAB — MRSA PCR SCREENING: MRSA by PCR: NEGATIVE

## 2013-03-12 LAB — TROPONIN I
Troponin I: 4.89 ng/mL (ref <0.30)
Troponin I: 4.33 ng/mL (ref <0.30)

## 2013-03-12 MED ORDER — DABIGATRAN ETEXILATE MESYLATE 150 MG PO CAPS
150.0000 mg | ORAL_CAPSULE | Freq: Two times a day (BID) | ORAL | Status: DC
  Administered 2013-03-12 – 2013-03-16 (×10): 150 mg via ORAL
  Filled 2013-03-12 (×13): qty 1

## 2013-03-12 MED ORDER — ONDANSETRON HCL 4 MG PO TABS: 4.0000 mg | ORAL_TABLET | Freq: Four times a day (QID) | ORAL | Status: DC | PRN

## 2013-03-12 MED ORDER — MOXIFLOXACIN HCL 0.5 % OP SOLN
1.0000 [drp] | Freq: Four times a day (QID) | OPHTHALMIC | Status: DC
  Administered 2013-03-12 – 2013-03-23 (×40): 1 [drp] via OPHTHALMIC

## 2013-03-12 MED ORDER — ATORVASTATIN CALCIUM 10 MG PO TABS
10.0000 mg | ORAL_TABLET | Freq: Every day | ORAL | Status: DC
  Administered 2013-03-12 – 2013-03-22 (×10): 10 mg via ORAL
  Filled 2013-03-12 (×12): qty 1

## 2013-03-12 MED ORDER — METOPROLOL TARTRATE 50 MG PO TABS
50.0000 mg | ORAL_TABLET | Freq: Two times a day (BID) | ORAL | Status: DC
Start: 2013-03-12 — End: 2013-03-23
  Administered 2013-03-12 – 2013-03-23 (×23): 50 mg via ORAL
  Filled 2013-03-12 (×25): qty 1

## 2013-03-12 MED ORDER — SIMVASTATIN 20 MG PO TABS
20.0000 mg | ORAL_TABLET | Freq: Every day | ORAL | Status: DC
  Filled 2013-03-12 (×2): qty 1

## 2013-03-12 MED ORDER — SODIUM CHLORIDE 0.9 % IV SOLN: 250.0000 mL | INTRAVENOUS | Status: DC | PRN

## 2013-03-12 MED ORDER — PREDNISONE 20 MG PO TABS
30.0000 mg | ORAL_TABLET | Freq: Every day | ORAL | Status: DC
  Administered 2013-03-12 – 2013-03-14 (×3): 30 mg via ORAL
  Filled 2013-03-12 (×4): qty 1

## 2013-03-12 MED ORDER — METOPROLOL TARTRATE 1 MG/ML IV SOLN
2.5000 mg | Freq: Once | INTRAVENOUS | Status: AC
  Administered 2013-03-12: 2.5 mg via INTRAVENOUS
  Filled 2013-03-12: qty 5

## 2013-03-12 MED ORDER — LEVOFLOXACIN IN D5W 500 MG/100ML IV SOLN
500.0000 mg | INTRAVENOUS | Status: DC
  Administered 2013-03-12: 500 mg via INTRAVENOUS
  Filled 2013-03-12 (×2): qty 100

## 2013-03-12 MED ORDER — ONDANSETRON HCL 4 MG/2ML IJ SOLN: 4.0000 mg | Freq: Four times a day (QID) | INTRAMUSCULAR | Status: DC | PRN

## 2013-03-12 MED ORDER — SODIUM CHLORIDE 0.9 % IJ SOLN
3.0000 mL | Freq: Two times a day (BID) | INTRAMUSCULAR | Status: DC
  Administered 2013-03-12 – 2013-03-13 (×3): 3 mL via INTRAVENOUS

## 2013-03-12 MED ORDER — SODIUM CHLORIDE 0.9 % IJ SOLN: 3.0000 mL | INTRAMUSCULAR | Status: DC | PRN

## 2013-03-12 MED ORDER — DILTIAZEM HCL ER COATED BEADS 180 MG PO CP24
180.0000 mg | ORAL_CAPSULE | Freq: Every day | ORAL | Status: DC
  Administered 2013-03-12: 180 mg via ORAL
  Filled 2013-03-12 (×2): qty 1

## 2013-03-12 MED ORDER — ASPIRIN EC 81 MG PO TBEC
81.0000 mg | DELAYED_RELEASE_TABLET | Freq: Every day | ORAL | Status: DC
Start: 2013-03-12 — End: 2013-03-13
  Administered 2013-03-12: 81 mg via ORAL
  Filled 2013-03-12 (×2): qty 1

## 2013-03-12 MED ORDER — SODIUM CHLORIDE (HYPERTONIC) 5 % OP SOLN
1.0000 [drp] | Freq: Two times a day (BID) | OPHTHALMIC | Status: DC
Start: 2013-03-12 — End: 2013-03-23
  Administered 2013-03-12 – 2013-03-23 (×22): 1 [drp] via OPHTHALMIC

## 2013-03-12 MED ORDER — INSULIN ASPART 100 UNIT/ML ~~LOC~~ SOLN
0.0000 [IU] | Freq: Three times a day (TID) | SUBCUTANEOUS | Status: DC
Start: 2013-03-12 — End: 2013-03-23
  Administered 2013-03-13: 2 [IU] via SUBCUTANEOUS
  Administered 2013-03-13: 1 [IU] via SUBCUTANEOUS
  Administered 2013-03-13: 5 [IU] via SUBCUTANEOUS
  Administered 2013-03-14: 2 [IU] via SUBCUTANEOUS
  Administered 2013-03-14: 1 [IU] via SUBCUTANEOUS
  Administered 2013-03-14: 2 [IU] via SUBCUTANEOUS
  Administered 2013-03-15: 1 [IU] via SUBCUTANEOUS
  Administered 2013-03-16: 2 [IU] via SUBCUTANEOUS
  Administered 2013-03-16: 1 [IU] via SUBCUTANEOUS
  Administered 2013-03-17: 2 [IU] via SUBCUTANEOUS
  Administered 2013-03-17 – 2013-03-18 (×2): 1 [IU] via SUBCUTANEOUS
  Administered 2013-03-19 (×2): 2 [IU] via SUBCUTANEOUS
  Administered 2013-03-20: 1 [IU] via SUBCUTANEOUS
  Administered 2013-03-20 – 2013-03-22 (×5): 2 [IU] via SUBCUTANEOUS

## 2013-03-12 MED ORDER — MORPHINE SULFATE 2 MG/ML IJ SOLN
2.0000 mg | INTRAMUSCULAR | Status: DC | PRN
  Administered 2013-03-12 (×2): 2 mg via INTRAVENOUS
  Filled 2013-03-12 (×2): qty 1

## 2013-03-12 NOTE — Progress Notes (Signed)
PHARMACIST - PHYSICIAN COMMUNICATION DR:     DESCRIPTION: 80yo male admitted w/ NSTEMI, on Pradaxa PTA for Afib.  Pt takes 150mg  ONCE DAILY; per last cards OV in November 2014 Pradaxa was correctly ordered as BID.  As once daily dosing will not provide appropriate anticoagulation, will change inpatient order to BID; please review this and change if needed.  Pt may need education regarding proper use.  Wynona Neat, PharmD, BCPS 03/12/2013 12:45 AM

## 2013-03-12 NOTE — Consult Note (Signed)
Reason for Consult: Right knee pain, rule out gout versus infection Referring Physician:  Broadus John, MD  Donell Jalomo [name] is an 80 y.o. male.  HPI: 80 yo male known to our practice (don't have office notes to review currently but most likely related to osteoarthritis issues).  Admitted last night with cardiac concerns Reports pain in both knees, right greater than left, left greater than right ankle pain History of gout but no recent treatment rendered with prednisone and colchicine  Difficulty with ambulation related to both knees and ankles, overall deconditioning  Past Medical History  Diagnosis Date  . Hypertension   . Gout     Acute episode involving ankle in 12/2011  . Hyperlipidemia   . Deep vein thrombophlebitis of left leg 11/2010    LLE; S/P THA  . Fasting hyperglycemia   . Stroke 08/2009; 07/2010    TIA in 07/2010  . H/O heat stroke 08/2009  . Prostate cancer 2013    adenocarcinoma; Gleason grade 8; PSA 9.45; EBRT 2013-14  . Renal cyst   . Syncope     01/2012: Negative pharmacologic stress nuclear  . PAF (paroxysmal atrial fibrillation)     a. first noted 01/2012 during admission for URI;  b. seen again 03/2012 after nstemi of unknown origin - pradaxa initiated.  Marland Kitchen CAD (coronary artery disease)     a. 01/2012 Elev Ti-->nonischemic MV;  b. 03/2012 NSTEMI/Cath: LM 50, LAD 50p/m, 46m/d, LCX nl, RI 60ost sup branch, RCA diff nonobs, EF 55%    Past Surgical History  Procedure Laterality Date  . Hip arthroplasty  12/08/2010    ARTHROPLASTY BIPOLAR HIP;  Surgeon-Braileigh Landenberger; Left  . 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 I&d extremity  05/28/2011    Procedure: IRRIGATION AND DEBRIDEMENT EXTREMITY;  Surgeon: Linna Hoff, MD;  Location: [facility];  Service: Orthopedics;  Laterality: Right;  I & D Right hand/wrist  . Colonoscopy   07/18/2003    RMR: Rectum internal hemorrhoids.  Otherwise, normal rectum/ Left-sided diverticula.  Remainder of colonic mucosa-nl     Family History  Problem Relation Age of Onset  . Prostate cancer Brother   . Colon cancer Sister 52  . Diabetes Mellitus II Father   . Stroke Father     Social History:  reports that he quit smoking about 65 years ago. His smoking use included Cigarettes and Cigars. He smoked 0.00 packs per day. His smokeless tobacco use includes Chew. He reports that he does not drink alcohol or use illicit drugs.  Allergies:  Allergies  Allergen Reactions  . Tetanus Toxoids Swelling    Also allergic to "high powered pain medications."    Medications:  I have reviewed the patient's current medications. Scheduled: . aspirin EC  81 mg Oral Daily  . dabigatran  150 mg Oral BID  . diltiazem  180 mg Oral Daily  . insulin aspart  0-9 Units Subcutaneous TID WC  . levofloxacin (LEVAQUIN) IV  500 mg Intravenous Q24H  . metoprolol  50 mg Oral BID  . moxifloxacin  1 drop Left Eye Q6H  . simvastatin  20 mg Oral QHS  . sodium chloride  1 drop Left Eye BID  . sodium chloride  3 mL Intravenous Q12H    Results for orders placed during the hospital encounter of 03/11/13 (from the past 24 hour(s))  CBC WITH DIFFERENTIAL     Status: Abnormal   Collection Time  03/11/13  6:28 PM      Result Value Ref Range   WBC 10.9 (*) 4.0 - 10.5 K/uL   RBC 3.79 (*) 4.22 - 5.81 MIL/uL   Hemoglobin 12.8 (*) 13.0 - 17.0 g/dL   HCT 38.5 (*) 39.0 - 52.0 %   MCV 101.6 (*) 78.0 - 100.0 fL   MCH 33.8  26.0 - 34.0 pg   MCHC 33.2  30.0 - 36.0 g/dL   RDW 14.9  11.5 - 15.5 %   Platelets 182  150 - 400 K/uL   Neutrophils Relative % 76  43 - 77 %   Neutro Abs 8.3 (*) 1.7 - 7.7 K/uL   Lymphocytes Relative 12  12 - 46 %   Lymphs Abs 1.3  0.7 - 4.0 K/uL   Monocytes Relative 10  3 - 12 %   Monocytes Absolute 1.1 (*) 0.1 - 1.0 K/uL   Eosinophils Relative 2  0 - 5 %   Eosinophils Absolute 0.2  0.0 - 0.7 K/uL   Basophils Relative 1  0 - 1 %   Basophils Absolute 0.1  0.0 - 0.1 K/uL  COMPREHENSIVE METABOLIC PANEL     Status:  Abnormal   Collection Time    03/11/13  6:28 PM      Result Value Ref Range   Sodium 139  137 - 147 mEq/L   Potassium 3.4 (*) 3.7 - 5.3 mEq/L   Chloride 102  96 - 112 mEq/L   CO2 24  19 - 32 mEq/L   Glucose, Bld 161 (*) 70 - 99 mg/dL   BUN 14  6 - 23 mg/dL   Creatinine, Ser 1.08  0.50 - 1.35 mg/dL   Calcium 10.2  8.4 - 10.5 mg/dL   Total Protein 6.4  6.0 - 8.3 g/dL   Albumin 3.0 (*) 3.5 - 5.2 g/dL   AST 39 (*) 0 - 37 U/L   ALT 12  0 - 53 U/L   Alkaline Phosphatase 73  39 - 117 U/L   Total Bilirubin 0.5  0.3 - 1.2 mg/dL   GFR calc non Af Amer 63 (*) >90 mL/min   GFR calc Af Amer 73 (*) >90 mL/min  TROPONIN I     Status: Abnormal   Collection Time    03/11/13  6:28 PM      Result Value Ref Range   Troponin I 3.95 (*) <0.30 ng/mL  URIC ACID     Status: None   Collection Time    03/11/13  7:09 PM      Result Value Ref Range   Uric Acid, Serum 7.5  4.0 - 7.8 mg/dL  LACTIC ACID, PLASMA     Status: None   Collection Time    03/11/13  8:04 PM      Result Value Ref Range   Lactic Acid, Venous 1.8  0.5 - 2.2 mmol/L  URINALYSIS, ROUTINE W REFLEX MICROSCOPIC     Status: Abnormal   Collection Time    03/11/13  8:11 PM      Result Value Ref Range   Color, Urine YELLOW  YELLOW   APPearance CLEAR  CLEAR   Specific Gravity, Urine 1.020  1.005 - 1.030   pH 6.0  5.0 - 8.0   Glucose, UA NEGATIVE  NEGATIVE mg/dL   Hgb urine dipstick NEGATIVE  NEGATIVE   Bilirubin Urine NEGATIVE  NEGATIVE   Ketones, ur TRACE (*) NEGATIVE mg/dL   Protein, ur NEGATIVE  NEGATIVE mg/dL  Urobilinogen, UA 0.2  0.0 - 1.0 mg/dL   Nitrite NEGATIVE  NEGATIVE   Leukocytes, UA NEGATIVE  NEGATIVE  INFLUENZA PANEL BY PCR (TYPE A & B, H1N1)     Status: None   Collection Time    03/11/13  8:18 PM      Result Value Ref Range   Influenza A By PCR NEGATIVE  NEGATIVE   Influenza B By PCR NEGATIVE  NEGATIVE   H1N1 flu by pcr NOT DETECTED  NOT DETECTED  CULTURE, BLOOD (ROUTINE X 2)     Status: None   Collection Time     03/11/13  8:34 PM      Result Value Ref Range   Specimen Description LEFT ANTECUBITAL     Special Requests BOTTLES DRAWN AEROBIC AND ANAEROBIC 8CC     Culture NO GROWTH 1 DAY     Report Status PENDING    CULTURE, BLOOD (ROUTINE X 2)     Status: None   Collection Time    03/11/13  8:34 PM      Result Value Ref Range   Specimen Description LEFT ANTECUBITAL     Special Requests BOTTLES DRAWN AEROBIC ONLY 8CC      Culture NO GROWTH 1 DAY     Report Status PENDING    MRSA PCR SCREENING     Status: None   Collection Time    03/12/13 12:31 AM      Result Value Ref Range   MRSA by PCR NEGATIVE  NEGATIVE  TROPONIN I     Status: Abnormal   Collection Time    03/12/13 12:36 AM      Result Value Ref Range   Troponin I 4.89 (*) <0.30 ng/mL  GLUCOSE, CAPILLARY     Status: None   Collection Time    03/12/13  8:09 AM      Result Value Ref Range   Glucose-Capillary 84  70 - 99 mg/dL  COMPREHENSIVE METABOLIC PANEL     Status: Abnormal   Collection Time    03/12/13  9:28 AM      Result Value Ref Range   Sodium 144  137 - 147 mEq/L   Potassium 3.7  3.7 - 5.3 mEq/L   Chloride 109  96 - 112 mEq/L   CO2 21  19 - 32 mEq/L   Glucose, Bld 85  70 - 99 mg/dL   BUN 18  6 - 23 mg/dL   Creatinine, Ser 1.09  0.50 - 1.35 mg/dL   Calcium 9.9  8.4 - 10.5 mg/dL   Total Protein 5.5 (*) 6.0 - 8.3 g/dL   Albumin 2.7 (*) 3.5 - 5.2 g/dL   AST 43 (*) 0 - 37 U/L   ALT 11  0 - 53 U/L   Alkaline Phosphatase 61  39 - 117 U/L   Total Bilirubin 0.5  0.3 - 1.2 mg/dL   GFR calc non Af Amer 63 (*) >90 mL/min   GFR calc Af Amer 73 (*) >90 mL/min  TROPONIN I     Status: Abnormal   Collection Time    03/12/13  9:28 AM      Result Value Ref Range   Troponin I 4.33 (*) <0.30 ng/mL    X-ray: CLINICAL DATA: Fall  EXAM:  RIGHT KNEE - COMPLETE 4+ VIEW  COMPARISON: None.  FINDINGS:  There is no evidence of fracture, dislocation, or joint effusion.  There is no evidence of arthropathy or other focal bone  abnormality.  Soft  tissues are unremarkable. Tricompartmental degenerative change  is noted. Detail is obscured by patient inability to remain still  for the examination. Vascular calcifications are noted. No  suprapatellar effusion.   IMPRESSION:  Negative.  Electronically Signed  By: Conchita Paris M.D   ROS Review of Systems:  HEENT: Headache, blurred vision, runny nose, sore throat  Neck: Hypothyroidism, hyperthyroidism,,lymphadenopathy  Chest : Shortness of breath, history of COPD, Asthma  Heart : Chest pain, history of coronary arterey disease  GI: Nausea, vomiting, diarrhea, constipation, GERD  GU: Dysuria, urgency, frequency of urination, hematuria  Neuro: Stroke, seizures, syncope  Psych: Depression, anxiety, hallucinations   Blood pressure 121/61, pulse 69, temperature 98.8 F (37.1 C), temperature source Oral, resp. rate 21, height 5\' 9"  (1.753 m), weight 101.8 kg (224 lb 6.9 oz), SpO2 96.00%.  Physical Exam Awake alert, family in room  Right knee, no effusion, no significant warmth, tender to palpation Both ankles very tender to touch as well, left greater than right  General medical exam reviewed as pertinent to consultation  Assessment/Plan: Bilateral knee and ankle pain Appears to more consistent with osteoarthritis versus acute gouty flare  Given no effusion on exam, no aspiration necessary  Instead would recommend treating with prednisone 5mg  12 day taper (start at 30mg  X2 days, then 25 mg X2 days, etc) once cleared medically, this will help with all arthritic conditions Can follow up in outpatient setting once discharged from hospital  Waldo Damian D 03/12/2013, 10:28 AM

## 2013-03-12 NOTE — Progress Notes (Signed)
Subjective/Objective Transfer from [facility] for medical management of NSTEMI.  Arrived around 1 am.  Scheduled Meds: . aspirin      . aspirin EC  81 mg Oral Daily  . dabigatran  150 mg Oral BID  . diltiazem  180 mg Oral Daily  . insulin aspart  0-9 Units Subcutaneous TID WC  . levofloxacin (LEVAQUIN) IV  500 mg Intravenous Q24H  . metoprolol  50 mg Oral BID  . simvastatin  20 mg Oral QHS  . sodium chloride  3 mL Intravenous Q12H   Continuous Infusions:  PRN Meds:sodium chloride, morphine injection, ondansetron (ZOFRAN) IV, ondansetron, sodium chloride  Vital signs in last 24 hours: Temp:  [97.7 F (36.5 C)-101.5 F (38.6 C)] 98.2 F (36.8 C) (02/15 0400) Pulse Rate:  [55-74] 55 (02/15 0400) Resp:  [13-28] 17 (02/15 0400) BP: (103-134)/(42-71) 122/59 mmHg (02/15 0400) SpO2:  [91 %-100 %] 98 % (02/15 0400) Weight:  [101.8 kg (224 lb 6.9 oz)] 101.8 kg (224 lb 6.9 oz) (02/15 0026)  Intake/Output last 3 shifts:   Intake/Output this shift: Total I/O In: 0  Out: 75 [Urine:75]  Problem Assessment/Plan NSTEMI:  No pain/distress at current time. VSS. Continue current plan/medical management.

## 2013-03-12 NOTE — Consult Note (Signed)
Cardiology Consult Note Date: 03/12/2013  Patient name: [name] Medical record number: [id number] Date of birth: [birth year] Age: 80 y.o. Gender: male PCP: Leonides Grills, MD  Medical Service: Attending name:Dr. Broadus John  Reason for consult: NSTEMI  History of Present Illness: [name] is a 80 yo man extensive pmh severe OSA, Hypertension, Gout, Hyperlipidemia; hx Deep vein thrombophlebitis of left leg (11/2010); DM type 2, Stroke (08/2009; 07/2010); Prostate cancer (2013); Syncope; PAF on pradaxa/dilt/metoprolol.  Presented with CP/NSTEMI in 3/14 (peak trop 3.8)  LHC 3/14 nonobstructive CAD (presented with NSTEMI - peak trop 3.8) and preserved EF     -- LM 50, LAD 50p/m, 23m/d, LCX nl, RI 60ost sup branch, RCA diff nonobs, EF 55%   Over past several months has had multiple admits for AMS and falls.   Presented yesterday with AMS, fever and fall. Said leg locked up. HCPOA is brother whom provided some of the history in the ED that the patient had fallen in the bathroom and been having some URI type symptoms for a couple of days. Troponin 3.9->4.9->4.3. NSR. Chronic RBBB. No ST-T wave abnormalities.   More alert this am. Denies CP or SOB. Main complaint is R knee pain. Felt to have gout.    Meds: Medications Prior to Admission  Medication Sig Dispense Refill  . allopurinol (ZYLOPRIM) 300 MG tablet Take 300 mg by mouth daily.      Marland Kitchen ascorbic acid (VITAMIN C) 500 MG tablet Take 1,000 mg by mouth every morning.       Marland Kitchen aspirin EC 81 MG tablet Take 81 mg by mouth daily.      . calcium-vitamin D (OSCAL WITH D) 500-200 MG-UNIT per tablet Take 1 tablet by mouth every morning.       . cholecalciferol (VITAMIN D) 1000 UNITS tablet Take 1,000 Units by mouth daily.      . COD LIVER OIL PO Take 2 tablets by mouth 2 (two) times daily.       . dabigatran (PRADAXA) 150 MG CAPS capsule Take 150 mg by mouth daily.      Marland Kitchen diltiazem (CARDIZEM CD) 180 MG 24 hr capsule Take 180 mg by mouth daily.       Marland Kitchen glipiZIDE (GLUCOTROL XL) 2.5 MG 24 hr tablet Take 2.5 mg by mouth daily.       . metoprolol (LOPRESSOR) 50 MG tablet Take 50 mg by mouth 2 (two) times daily.      . Multiple Vitamin (MULITIVITAMIN WITH MINERALS) TABS Take 1 tablet by mouth every morning.       . Omega-3 Fatty Acids (FISH OIL PO) Take 1 capsule by mouth daily.      . simvastatin (ZOCOR) 20 MG tablet Take 20 mg by mouth at bedtime.       . nitroGLYCERIN (NITROSTAT) 0.4 MG SL tablet Place 0.4 mg under the tongue every 5 (five) minutes as needed for chest pain.      . predniSONE (DELTASONE) 10 MG tablet Take 1 tablet (10 mg total) by mouth daily as needed (severe gout.). Hold this while you are on higher dose prednisone taper        Allergies: Tetanus toxoids Past Medical History  Diagnosis Date  . Hypertension   . Gout     Acute episode involving ankle in 12/2011  . Hyperlipidemia   . Deep vein thrombophlebitis of left leg 11/2010    LLE; S/P THA  . Fasting hyperglycemia   . Stroke 08/2009; 07/2010  TIA in 07/2010  . H/O heat stroke 08/2009  . Prostate cancer 2013    adenocarcinoma; Gleason grade 8; PSA 9.45; EBRT 2013-14  . Renal cyst   . Syncope     01/2012: Negative pharmacologic stress nuclear  . PAF (paroxysmal atrial fibrillation)     a. first noted 01/2012 during admission for URI;  b. seen again 03/2012 after nstemi of unknown origin - pradaxa initiated.  Marland Kitchen CAD (coronary artery disease)     a. 01/2012 Elev Ti-->nonischemic MV;  b. 03/2012 NSTEMI/Cath: LM 50, LAD 50p/m, 105m/d, LCX nl, RI 60ost sup branch, RCA diff nonobs, EF 55%   Past Surgical History  Procedure Laterality Date  . Hip arthroplasty  12/08/2010    ARTHROPLASTY BIPOLAR HIP;  Surgeon-Olin; Left  . 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 I&d extremity  05/28/2011    Procedure: IRRIGATION AND DEBRIDEMENT EXTREMITY;  Surgeon: Linna Hoff, MD;  Location: [facility];  Service: Orthopedics;  Laterality: Right;  I & D Right  hand/wrist  . Colonoscopy   07/18/2003    RMR: Rectum internal hemorrhoids.  Otherwise, normal rectum/ Left-sided diverticula.  Remainder of colonic mucosa-nl   Family History  Problem Relation Age of Onset  . Prostate cancer Brother   . Colon cancer Sister 13  . Diabetes Mellitus II Father   . Stroke Father    History   Social History  . Marital Status: Married    Spouse Name: N/A    Number of Children: 0  . Years of Education: N/A   Occupational History  . Banking     Retired  . Farming         "   Social History Main Topics  . Smoking status: Former Smoker    Types: Cigarettes, Cigars    Quit date: 01/27/1948  . Smokeless tobacco: Current User    Types: Chew     Comment: "quit smoking cigarettes age 60 or 64"  . Alcohol Use: No  . Drug Use: No  . Sexual Activity: No   Other Topics Concern  . Not on file   Social History Narrative   Lives w/ wife    Review of Systems: Pertinent items are noted in HPI.  Physical Exam: Blood pressure 121/61, pulse 69, temperature 98.8 F (37.1 C), temperature source Oral, resp. rate 21, height 5\' 9"  (1.753 m), weight 224 lb 6.9 oz (101.8 kg), SpO2 96.00%. General: resting in bed, NAD HEENT: PERRL, EOMI, no scleral icterus Cardiac: Regular, no rubs, murmurs or gallops Pulm: clear to auscultation bilaterally, moving normal volumes of air Abd: soft, nontender, nondistended, BS present Ext: warm and well perfused, no pedal edema, right knee warm and tender to palpation Neuro: alert and oriented to self and place, cranial nerves II-XII grossly intact  Lab results: Basic Metabolic Panel:  Recent Labs  03/11/13 1828  NA 139  K 3.4*  CL 102  CO2 24  GLUCOSE 161*  BUN 14  CREATININE 1.08  CALCIUM 10.2   Liver Function Tests:  Recent Labs  03/11/13 1828  AST 39*  ALT 12  ALKPHOS 73  BILITOT 0.5  PROT 6.4  ALBUMIN 3.0*   CBC:  Recent Labs  03/11/13 1828  WBC 10.9*  NEUTROABS 8.3*  HGB 12.8*  HCT 38.5*   MCV 101.6*  PLT 182   Cardiac Enzymes:  Recent Labs  03/11/13 1828 03/12/13 0036  TROPONINI 3.95* 4.89*  CBG:  Recent Labs  03/12/13 0809  GLUCAP 84   Urine Drug Screen: Drugs of Abuse     Component Value Date/Time   LABOPIA NONE DETECTED 11/27/2012 1943   COCAINSCRNUR NONE DETECTED 11/27/2012 1943   LABBENZ NONE DETECTED 11/27/2012 1943   AMPHETMU NONE DETECTED 11/27/2012 1943   THCU NONE DETECTED 11/27/2012 1943   LABBARB NONE DETECTED 11/27/2012 1943    Cardiac Studies:  Echo 3/14: EF 60%, no WMA, LVH   01/2012 Elev Ti: nonischemic MV  Imaging results:  Dg Pelvis 1-2 Views  03/11/2013   CLINICAL DATA:  Fall  EXAM: PELVIS - 1-2 VIEW  COMPARISON:  None.  FINDINGS: There is no evidence of pelvic fracture or diastasis. No other pelvic bone lesions are seen. Suboptimal visualization due to body habitus and technique. Clips project over the pelvis. Left total hip arthroplasty is grossly unremarkable but incompletely visualized.  IMPRESSION: No displaced pelvic fracture allowing for technique as above.   Electronically Signed   By: Conchita Paris M.D.   On: 03/11/2013 19:22   Dg Elbow Complete Left  03/11/2013   CLINICAL DATA:  Fall  EXAM: LEFT ELBOW - COMPLETE 3+ VIEW  COMPARISON:  None.  FINDINGS: There is no evidence of fracture, dislocation, or joint effusion. There is no evidence of arthropathy or other focal bone abnormality. Soft tissues are unremarkable. The patient was unable to cooperate for optimal positioning.  IMPRESSION: Negative.   Electronically Signed   By: Conchita Paris M.D.   On: 03/11/2013 19:21   Dg Elbow Complete Right  03/11/2013   CLINICAL DATA:  Fall  EXAM: RIGHT ELBOW - COMPLETE 3+ VIEW  COMPARISON:  None.  FINDINGS: There is no evidence of fracture, dislocation, or joint effusion. There is no evidence of arthropathy or other focal bone abnormality. Soft tissues are unremarkable. The patient was unable to be properly positioned for all views.   IMPRESSION: Negative.   Electronically Signed   By: Conchita Paris M.D.   On: 03/11/2013 19:22   Ct Head Wo Contrast  03/11/2013   CLINICAL DATA:  Fall, altered mental status and headache  EXAM: CT HEAD WITHOUT CONTRAST  TECHNIQUE: Contiguous axial images were obtained from the base of the skull through the vertex without intravenous contrast.  COMPARISON:  Prior CT scan of the head 11/27/2012  FINDINGS: Negative for acute intracranial hemorrhage, acute infarction, mass, mass effect, hydrocephalus or midline shift. Gray-white differentiation is preserved throughout. No acute soft tissue or calvarial abnormality. Stable cerebral and cerebellar volume loss with compensatory ex vacuo dilatation of the lateral ventricles. Mild periventricular white matter hypoattenuation consistent with longstanding microvascular ischemic white matter changes. Chronic partial right mastoid effusion. The left mastoid air cells are well aerated. Partial opacification of the left sphenoid sinus. This is a new finding compared to prior. Atherosclerotic calcification in the bilateral cavernous and supra clinoid carotid arteries.  IMPRESSION: 1. No acute intracranial abnormality. 2. Developing partial opacification of the left sphenoid sinus consistent with inflammatory paranasal sinus disease. 3. Chronic partial right mastoid opacification. Query chronic mastoiditis? 4. Stable cerebral and cerebellar atrophy, mild chronic ischemic white matter changes and intracranial atherosclerosis.   Electronically Signed   By: Jacqulynn Cadet M.D.   On: 03/11/2013 19:34   Dg Chest Port 1 View  03/11/2013   CLINICAL DATA:  Non ST elevation myocardial infarction  EXAM: PORTABLE CHEST - 1 VIEW  COMPARISON:  Prior chest x-ray 11/06/2012  FINDINGS: Stable cardiac and mediastinal contours with mild cardiomegaly.  Atherosclerotic calcification noted in the transverse aorta. Negative for pulmonary edema. No pneumothorax, focal airspace consolidation or  pleural effusion. No acute osseous abnormality.  IMPRESSION: Stable cardiomegaly without evidence of failure.   Electronically Signed   By: Jacqulynn Cadet M.D.   On: 03/11/2013 19:56   Dg Knee Complete 4 Views Right  03/11/2013   CLINICAL DATA:  Fall  EXAM: RIGHT KNEE - COMPLETE 4+ VIEW  COMPARISON:  None.  FINDINGS: There is no evidence of fracture, dislocation, or joint effusion. There is no evidence of arthropathy or other focal bone abnormality. Soft tissues are unremarkable. Tricompartmental degenerative change is noted. Detail is obscured by patient inability to remain still for the examination. Vascular calcifications are noted. No suprapatellar effusion.  IMPRESSION: Negative.   Electronically Signed   By: Conchita Paris M.D.   On: 03/11/2013 19:21   Assessment & Plan by Problem: # Nstemi: known CAD but last LHC with non-obstructive dz 3/14 on medical management. Pt appears encephalopathic and therefore unsure if having pain. No new EKG changes but continued elevation of trops.  -cycle enzyme  #PAF - now in sinus. Anticoagulated with Eliquis  # Fever  # Altered mental status  Signed: Clinton Gallant 03/12/2013, 8:21 AM   Patient seen and examined with Dar. Sadek. We discussed all aspects of the encounter. I agree with the assessment and plan as stated above.   Troponin is mildly elevated but no other evidence of ACS. Denies CP or SOB. ECG normal. Cath films reviewed from last year. Nonobstructive CAD. He is not interested in repeat cath and I don't think he needs one either. Would continue to manage CAD medically with asa 81, statin and b-blocker as tolerated. Will check echo to make sure LV function preserved. Will sign off pending echo results. Call with questions.   Brondon Wann,MD 10:48 AM

## 2013-03-12 NOTE — Progress Notes (Signed)
  Echocardiogram 2D Echocardiogram has been performed.  [name] 03/12/2013, 2:22 PM

## 2013-03-12 NOTE — Progress Notes (Addendum)
[facility] PROGRESS NOTE  [name] UMP:[id number] DOB: April 10, 1931 DOA: 03/11/2013 PCP: Leonides Grills, MD  Assessment/Plan: Non-STEMI  -cath 3/14 with nonobstructive CAD -cards eval pending, notified -continue aspirin, Lopressor, Zocor -currently on Pradaxa  Fever/metabolic encephalopathy -Given R knee effusion with pain, limited RoM, fever i requested Orthopedics eval to r/o septic knee -gout is also possible vs hemarthrosis from fall -no other clear source of infection at this time, UA/CXR unremarkable  R knee effusion -see above  Atrial fibrillation  Rate controlled continue Lopressor and Cardizem. Continue full dose anticoagulation   Hypertension  stable, continue Lopressor and Cardizem   Diabetes mellitus  Continue sliding scale insulin. Hold Glucotrol XL at this time.   DVT proph: on anticoagulation with pradaxa  Code Status: Full Code Family Communication: none at bedside Disposition Plan: transfer to tele   Consultants:  Cards pending  Ortho pending  Antibiotics:  levaquin  HPI/Subjective: Denies any chest pain, or dyspnea Was able to tell me that he almost fell yesterday   Objective: Filed Vitals:   03/12/13 0806  BP: 121/61  Pulse: 69  Temp: 98.8 F (37.1 C)  Resp: 21    Intake/Output Summary (Last 24 hours) at 03/12/13 0933 Last data filed at 03/12/13 0800  Gross per 24 hour  Intake     10 ml  Output     75 ml  Net    -65 ml   Filed Weights   03/12/13 0026  Weight: 101.8 kg (224 lb 6.9 oz)    Exam:   General:  Alert, awake, oriented to self, place and only partly to time  Cardiovascular: S1S2/RRR  Respiratory: CTAB  Abdomen: soft, Nt, BS present  Musculoskeletal: R knee effusion/erythema with severe tenderness, limited RoM  Data Reviewed: Basic Metabolic Panel:  Recent Labs Lab 03/11/13 1828  NA 139  K 3.4*  CL 102  CO2 24  GLUCOSE 161*  BUN 14  CREATININE 1.08  CALCIUM 10.2   Liver Function  Tests:  Recent Labs Lab 03/11/13 1828  AST 39*  ALT 12  ALKPHOS 73  BILITOT 0.5  PROT 6.4  ALBUMIN 3.0*   No results found for this basename: LIPASE, AMYLASE,  in the last 168 hours No results found for this basename: AMMONIA,  in the last 168 hours CBC:  Recent Labs Lab 03/11/13 1828  WBC 10.9*  NEUTROABS 8.3*  HGB 12.8*  HCT 38.5*  MCV 101.6*  PLT 182   Cardiac Enzymes:  Recent Labs Lab 03/11/13 1828 03/12/13 0036  TROPONINI 3.95* 4.89*   BNP (last 3 results)  Recent Labs  05/04/12 2006  PROBNP 353.7   CBG:  Recent Labs Lab 03/12/13 0809  GLUCAP 84    Recent Results (from the past 240 hour(s))  CULTURE, BLOOD (ROUTINE X 2)     Status: None   Collection Time    03/11/13  8:34 PM      Result Value Ref Range Status   Specimen Description LEFT ANTECUBITAL   Final   Special Requests BOTTLES DRAWN AEROBIC AND ANAEROBIC 8CC   Final   Culture NO GROWTH 1 DAY   Final   Report Status PENDING   Incomplete  CULTURE, BLOOD (ROUTINE X 2)     Status: None   Collection Time    03/11/13  8:34 PM      Result Value Ref Range Status   Specimen Description LEFT ANTECUBITAL   Final   Special Requests BOTTLES DRAWN AEROBIC ONLY [place]  Final   Culture NO GROWTH 1 DAY   Final   Report Status PENDING   Incomplete  MRSA PCR SCREENING     Status: None   Collection Time    03/12/13 12:31 AM      Result Value Ref Range Status   MRSA by PCR NEGATIVE  NEGATIVE Final   Comment:            The GeneXpert MRSA Assay (FDA     approved for NASAL specimens     only), is one component of a     comprehensive MRSA colonization     surveillance program. It is not     intended to diagnose MRSA     infection nor to guide or     monitor treatment for     MRSA infections.     Studies: Dg Pelvis 1-2 Views  03/11/2013   CLINICAL DATA:  Fall  EXAM: PELVIS - 1-2 VIEW  COMPARISON:  None.  FINDINGS: There is no evidence of pelvic fracture or diastasis. No other pelvic bone lesions  are seen. Suboptimal visualization due to body habitus and technique. Clips project over the pelvis. Left total hip arthroplasty is grossly unremarkable but incompletely visualized.  IMPRESSION: No displaced pelvic fracture allowing for technique as above.   Electronically Signed   By: Conchita Paris M.D.   On: 03/11/2013 19:22   Dg Elbow Complete Left  03/11/2013   CLINICAL DATA:  Fall  EXAM: LEFT ELBOW - COMPLETE 3+ VIEW  COMPARISON:  None.  FINDINGS: There is no evidence of fracture, dislocation, or joint effusion. There is no evidence of arthropathy or other focal bone abnormality. Soft tissues are unremarkable. The patient was unable to cooperate for optimal positioning.  IMPRESSION: Negative.   Electronically Signed   By: Conchita Paris M.D.   On: 03/11/2013 19:21   Dg Elbow Complete Right  03/11/2013   CLINICAL DATA:  Fall  EXAM: RIGHT ELBOW - COMPLETE 3+ VIEW  COMPARISON:  None.  FINDINGS: There is no evidence of fracture, dislocation, or joint effusion. There is no evidence of arthropathy or other focal bone abnormality. Soft tissues are unremarkable. The patient was unable to be properly positioned for all views.  IMPRESSION: Negative.   Electronically Signed   By: Conchita Paris M.D.   On: 03/11/2013 19:22   Ct Head Wo Contrast  03/11/2013   CLINICAL DATA:  Fall, altered mental status and headache  EXAM: CT HEAD WITHOUT CONTRAST  TECHNIQUE: Contiguous axial images were obtained from the base of the skull through the vertex without intravenous contrast.  COMPARISON:  Prior CT scan of the head 11/27/2012  FINDINGS: Negative for acute intracranial hemorrhage, acute infarction, mass, mass effect, hydrocephalus or midline shift. Gray-white differentiation is preserved throughout. No acute soft tissue or calvarial abnormality. Stable cerebral and cerebellar volume loss with compensatory ex vacuo dilatation of the lateral ventricles. Mild periventricular white matter hypoattenuation consistent with  longstanding microvascular ischemic white matter changes. Chronic partial right mastoid effusion. The left mastoid air cells are well aerated. Partial opacification of the left sphenoid sinus. This is a new finding compared to prior. Atherosclerotic calcification in the bilateral cavernous and supra clinoid carotid arteries.  IMPRESSION: 1. No acute intracranial abnormality. 2. Developing partial opacification of the left sphenoid sinus consistent with inflammatory paranasal sinus disease. 3. Chronic partial right mastoid opacification. Query chronic mastoiditis? 4. Stable cerebral and cerebellar atrophy, mild chronic ischemic white matter changes and intracranial atherosclerosis.   Electronically  Signed   By: Jacqulynn Cadet M.D.   On: 03/11/2013 19:34   Dg Chest Port 1 View  03/11/2013   CLINICAL DATA:  Non ST elevation myocardial infarction  EXAM: PORTABLE CHEST - 1 VIEW  COMPARISON:  Prior chest x-ray 11/06/2012  FINDINGS: Stable cardiac and mediastinal contours with mild cardiomegaly. Atherosclerotic calcification noted in the transverse aorta. Negative for pulmonary edema. No pneumothorax, focal airspace consolidation or pleural effusion. No acute osseous abnormality.  IMPRESSION: Stable cardiomegaly without evidence of failure.   Electronically Signed   By: Jacqulynn Cadet M.D.   On: 03/11/2013 19:56   Dg Knee Complete 4 Views Right  03/11/2013   CLINICAL DATA:  Fall  EXAM: RIGHT KNEE - COMPLETE 4+ VIEW  COMPARISON:  None.  FINDINGS: There is no evidence of fracture, dislocation, or joint effusion. There is no evidence of arthropathy or other focal bone abnormality. Soft tissues are unremarkable. Tricompartmental degenerative change is noted. Detail is obscured by patient inability to remain still for the examination. Vascular calcifications are noted. No suprapatellar effusion.  IMPRESSION: Negative.   Electronically Signed   By: Conchita Paris M.D.   On: 03/11/2013 19:21    Scheduled Meds: .  aspirin EC  81 mg Oral Daily  . dabigatran  150 mg Oral BID  . diltiazem  180 mg Oral Daily  . insulin aspart  0-9 Units Subcutaneous TID WC  . levofloxacin (LEVAQUIN) IV  500 mg Intravenous Q24H  . metoprolol  50 mg Oral BID  . simvastatin  20 mg Oral QHS  . sodium chloride  3 mL Intravenous Q12H   Continuous Infusions:   Principal Problem:   NSTEMI (non-ST elevated myocardial infarction) Active Problems:   Hypertension   Atrial fibrillation   CAD (coronary artery disease)    Time spent: 106min    [facility] Pager [phone] If 7PM-7AM, please contact night-coverage at [email or web address], password [email or web address] 03/12/2013, 9:33 AM  LOS: 1 day

## 2013-03-13 LAB — BASIC METABOLIC PANEL
BUN: 25 mg/dL — ABNORMAL HIGH (ref 6–23)
CHLORIDE: 103 meq/L (ref 96–112)
CO2: 17 mEq/L — ABNORMAL LOW (ref 19–32)
CREATININE: 1.21 mg/dL (ref 0.50–1.35)
Calcium: 9.7 mg/dL (ref 8.4–10.5)
GFR calc non Af Amer: 55 mL/min — ABNORMAL LOW (ref >90)
GFR, EST AFRICAN AMERICAN: 64 mL/min — AB (ref >90)
Glucose, Bld: 134 mg/dL — ABNORMAL HIGH (ref 70–99)
Potassium: 4.4 mEq/L (ref 3.7–5.3)
Sodium: 137 mEq/L (ref 137–147)

## 2013-03-13 LAB — GLUCOSE, CAPILLARY
Glucose-Capillary: 130 mg/dL — ABNORMAL HIGH (ref 70–99)
Glucose-Capillary: 174 mg/dL — ABNORMAL HIGH (ref 70–99)
Glucose-Capillary: 259 mg/dL — ABNORMAL HIGH (ref 70–99)
Glucose-Capillary: 187 mg/dL — ABNORMAL HIGH (ref 70–99)

## 2013-03-13 LAB — CBC
HCT: 33.4 % — ABNORMAL LOW (ref 39.0–52.0)
Hemoglobin: 11.2 g/dL — ABNORMAL LOW (ref 13.0–17.0)
MCH: 33.7 pg (ref 26.0–34.0)
MCHC: 33.5 g/dL (ref 30.0–36.0)
MCV: 100.6 fL — ABNORMAL HIGH (ref 78.0–100.0)
Platelets: 150 10*3/uL (ref 150–400)
RBC: 3.32 MIL/uL — ABNORMAL LOW (ref 4.22–5.81)
RDW: 14.6 % (ref 11.5–15.5)
WBC: 11.3 10*3/uL — AB (ref 4.0–10.5)

## 2013-03-13 MED ORDER — LEVOFLOXACIN 500 MG PO TABS
500.0000 mg | ORAL_TABLET | Freq: Every day | ORAL | Status: AC
  Administered 2013-03-13 – 2013-03-15 (×3): 500 mg via ORAL
  Filled 2013-03-13 (×4): qty 1

## 2013-03-13 NOTE — Discharge Instructions (Addendum)
Information on my medicine - ELIQUIS® (apixaban) ° ° °Why was Eliquis® prescribed for you? °Eliquis® was prescribed for you to reduce the risk of forming blood clots that can cause a stroke if you have a medical condition called atrial fibrillation (a type of irregular heartbeat) . ° °What do You need to know about Eliquis® ? °Take your Eliquis® TWICE DAILY - one tablet in the morning and one tablet in the evening with or without food.  It would be best to take the doses about the same time each day. ° °If you have difficulty swallowing the tablet whole please discuss with your pharmacist how to take the medication safely. ° °Take Eliquis® exactly as prescribed by your doctor and DO NOT stop taking Eliquis® without talking to the doctor who prescribed the medication.  Stopping may increase your risk of developing a new clot or stroke.  Refill your prescription before you run out. ° °After discharge, you should have regular check-up appointments with your healthcare provider that is prescribing your Eliquis®.  In the future your dose may need to be changed if your kidney function or weight changes by a significant amount or as you get older. ° °What do you do if you miss a dose? °If you miss a dose, take it as soon as you remember on the same day and resume taking twice daily.  Do not take more than one dose of ELIQUIS at the same time. ° °Important Safety Information °A possible side effect of Eliquis® is bleeding. You should call your healthcare provider right away if you experience any of the following: °? Bleeding from an injury or your nose that does not stop. °? Unusual colored urine (red or dark brown) or unusual colored stools (red or black). °? Unusual bruising for unknown reasons. °? A serious fall or if you hit your head (even if there is no bleeding). ° °Some medicines may interact with Eliquis® and might increase your risk of bleeding or clotting while on Eliquis®. To help avoid this, consult your  healthcare provider or pharmacist prior to using any new prescription or non-prescription medications, including herbals, vitamins, non-steroidal anti-inflammatory drugs (NSAIDs) and supplements. ° °This website has more information on Eliquis® (apixaban): www.Eliquis.com. ° ° ° °

## 2013-03-13 NOTE — Progress Notes (Signed)
Patient incont of large volume of urine, post bladder scan 140 ml. Will continue to monitor closely

## 2013-03-13 NOTE — Progress Notes (Signed)
75 ml emptied from condom cath, bladder scan 176ml, patient denies discomfort tolerating diet and liquids well, will notify Dr. Jennye Moccasin, Janalyn Harder

## 2013-03-13 NOTE — Progress Notes (Signed)
TELEMETRY: Reviewed telemetry pt in afib with controlled response. Bradycardia noted during the night with HR down to 46.: Filed Vitals:   03/12/13 2330 03/13/13 0000 03/13/13 0400 03/13/13 0758  BP:  107/53 118/63 129/61  Pulse: 75 53 56 65  Temp:  97.4 F (36.3 C) 98.2 F (36.8 C) 98.2 F (36.8 C)  TempSrc:  Axillary Oral Oral  Resp: 21 15 18 18   Height:      Weight:      SpO2: 95% 98% 97% 99%    Intake/Output Summary (Last 24 hours) at 03/13/13 0825 Last data filed at 03/13/13 0600  Gross per 24 hour  Intake 1992.33 ml  Output    465 ml  Net 1527.33 ml    SUBJECTIVE Denies chest pain, SOB, palpitations.   LABS: Basic Metabolic Panel:  Recent Labs  03/12/13 0928 03/13/13 0430  NA 144 137  K 3.7 4.4  CL 109 103  CO2 21 17*  GLUCOSE 85 134*  BUN 18 25*  CREATININE 1.09 1.21  CALCIUM 9.9 9.7   Liver Function Tests:  Recent Labs  03/11/13 1828 03/12/13 0928  AST 39* 43*  ALT 12 11  ALKPHOS 73 61  BILITOT 0.5 0.5  PROT 6.4 5.5*  ALBUMIN 3.0* 2.7*   No results found for this basename: LIPASE, AMYLASE,  in the last 72 hours CBC:  Recent Labs  03/11/13 1828 03/12/13 0928 03/13/13 0430  WBC 10.9* 11.2* 11.3*  NEUTROABS 8.3*  --   --   HGB 12.8* 11.2* 11.2*  HCT 38.5* 33.7* 33.4*  MCV 101.6* 101.8* 100.6*  PLT 182 148* 150   Cardiac Enzymes:  Recent Labs  03/11/13 1828 03/12/13 0036 03/12/13 0928  TROPONINI 3.95* 4.89* 4.33*    Radiology/Studies:  Dg Pelvis 1-2 Views  03/11/2013   CLINICAL DATA:  Fall  EXAM: PELVIS - 1-2 VIEW  COMPARISON:  None.  FINDINGS: There is no evidence of pelvic fracture or diastasis. No other pelvic bone lesions are seen. Suboptimal visualization due to body habitus and technique. Clips project over the pelvis. Left total hip arthroplasty is grossly unremarkable but incompletely visualized.  IMPRESSION: No displaced pelvic fracture allowing for technique as above.   Electronically Signed   By: Conchita Paris  M.D.   On: 03/11/2013 19:22   Dg Elbow Complete Left  03/11/2013   CLINICAL DATA:  Fall  EXAM: LEFT ELBOW - COMPLETE 3+ VIEW  COMPARISON:  None.  FINDINGS: There is no evidence of fracture, dislocation, or joint effusion. There is no evidence of arthropathy or other focal bone abnormality. Soft tissues are unremarkable. The patient was unable to cooperate for optimal positioning.  IMPRESSION: Negative.   Electronically Signed   By: Conchita Paris M.D.   On: 03/11/2013 19:21   Dg Elbow Complete Right  03/11/2013   CLINICAL DATA:  Fall  EXAM: RIGHT ELBOW - COMPLETE 3+ VIEW  COMPARISON:  None.  FINDINGS: There is no evidence of fracture, dislocation, or joint effusion. There is no evidence of arthropathy or other focal bone abnormality. Soft tissues are unremarkable. The patient was unable to be properly positioned for all views.  IMPRESSION: Negative.   Electronically Signed   By: Conchita Paris M.D.   On: 03/11/2013 19:22   Ct Head Wo Contrast  03/11/2013   CLINICAL DATA:  Fall, altered mental status and headache  EXAM: CT HEAD WITHOUT CONTRAST  TECHNIQUE: Contiguous axial images were obtained from the base of the skull through the vertex without  intravenous contrast.  COMPARISON:  Prior CT scan of the head 11/27/2012  FINDINGS: Negative for acute intracranial hemorrhage, acute infarction, mass, mass effect, hydrocephalus or midline shift. Gray-white differentiation is preserved throughout. No acute soft tissue or calvarial abnormality. Stable cerebral and cerebellar volume loss with compensatory ex vacuo dilatation of the lateral ventricles. Mild periventricular white matter hypoattenuation consistent with longstanding microvascular ischemic white matter changes. Chronic partial right mastoid effusion. The left mastoid air cells are well aerated. Partial opacification of the left sphenoid sinus. This is a new finding compared to prior. Atherosclerotic calcification in the bilateral cavernous and supra  clinoid carotid arteries.  IMPRESSION: 1. No acute intracranial abnormality. 2. Developing partial opacification of the left sphenoid sinus consistent with inflammatory paranasal sinus disease. 3. Chronic partial right mastoid opacification. Query chronic mastoiditis? 4. Stable cerebral and cerebellar atrophy, mild chronic ischemic white matter changes and intracranial atherosclerosis.   Electronically Signed   By: Jacqulynn Cadet M.D.   On: 03/11/2013 19:34   Dg Chest Port 1 View  03/11/2013   CLINICAL DATA:  Non ST elevation myocardial infarction  EXAM: PORTABLE CHEST - 1 VIEW  COMPARISON:  Prior chest x-ray 11/06/2012  FINDINGS: Stable cardiac and mediastinal contours with mild cardiomegaly. Atherosclerotic calcification noted in the transverse aorta. Negative for pulmonary edema. No pneumothorax, focal airspace consolidation or pleural effusion. No acute osseous abnormality.  IMPRESSION: Stable cardiomegaly without evidence of failure.   Electronically Signed   By: Jacqulynn Cadet M.D.   On: 03/11/2013 19:56   Dg Knee Complete 4 Views Right  03/11/2013   CLINICAL DATA:  Fall  EXAM: RIGHT KNEE - COMPLETE 4+ VIEW  COMPARISON:  None.  FINDINGS: There is no evidence of fracture, dislocation, or joint effusion. There is no evidence of arthropathy or other focal bone abnormality. Soft tissues are unremarkable. Tricompartmental degenerative change is noted. Detail is obscured by patient inability to remain still for the examination. Vascular calcifications are noted. No suprapatellar effusion.  IMPRESSION: Negative.   Electronically Signed   By: Conchita Paris M.D.   On: 03/11/2013 19:21   Ecg: afib, RBBB, LAFB.  Echo:Study Conclusions  - Procedure narrative: Transthoracic echocardiography. Image quality was suboptimal, due to poor sound transmission. - Left ventricle: The cavity size was normal. Wall thickness was increased in a pattern of mild LVH. Systolic function was normal. The estimated  ejection fraction was in the range of 60% to 65%. Images were inadequate for LV wall motion assessment. The study is not technically sufficient to allow evaluation of LV diastolic function. - Ventricular septum: Septal motion showed abnormal function and dyssynergy. These changes are consistent with intraventricular conduction delay. - Aortic valve: Moderately calcified annulus. Mildly calcified leaflets. - Mitral valve: Mild mitral annular calcification. Mildly thickened leaflets . Trivial regurgitation.   PHYSICAL EXAM General: Well developed, elderly, in no acute distress. Head: Normocephalic, atraumatic, sclera non-icteric, no xanthomas, nares are without discharge. Neck: Negative for carotid bruits. JVD not elevated. Lungs: Clear bilaterally to auscultation without wheezes, rales, or rhonchi. Breathing is unlabored. Heart: RRR S1 S2 without murmurs, rubs, or gallops.  Abdomen: Soft, non-tender, non-distended with normoactive bowel sounds. No hepatomegaly. No rebound/guarding. No obvious abdominal masses. Msk:  Strength and tone appears normal for age. Extremities: No clubbing, cyanosis or edema.  Distal pedal pulses are 2+ and equal bilaterally. Neuro: Alert and oriented X 3. Moves all extremities spontaneously. Psych:  Responds to questions appropriately with a normal affect.  ASSESSMENT AND PLAN: 1. Atrial fibrillation. Rate  well controlled- in fact rate is slow most of the night. Recommend stopping diltiazem and using metoprolol only for rate control. Chronic anticoagulation with Pradaxa. I would stop ASA due to increased bleeding risk.  2. NSTEMI. troponins elevated. No clinical symptoms of chest pain. Ecg without acute change. Nonobstructive CAD by cath in 3/14. No further work up planned. Continue statin and beta blocker. No ASA since on Pradaxa.   3. Gout 4. Fever 5. Altered mental status-improved.  I will sign off now- please call with further questions.  Principal  Problem:   NSTEMI (non-ST elevated myocardial infarction) Active Problems:   Hypertension   Atrial fibrillation   CAD (coronary artery disease)    Signed, Peter Martinique MD,FACC 03/13/2013 8:33 AM

## 2013-03-13 NOTE — Progress Notes (Signed)
12 lead EKG done shows sinus rhythm with short PR, Tele strip junc 61. Pt asymptomatic C.Berge NP made aware and Dr. Jennye Moccasin, Janalyn Harder

## 2013-03-13 NOTE — Evaluation (Signed)
Physical Therapy Evaluation Patient Details Name: [name] MRN: [id number] DOB: 1931-06-09 Today's Date: 03/13/2013 Time: 9323-5573 PT Time Calculation (min): 35 min  PT Assessment / Plan / Recommendation History of Present Illness  Presented to the ED with chief complaint of altered mental status, as per family patient became confused and had an assisted fall to the floor in the bathroom. +NSTEMI. Rt knee pain ?gout vs arthritis PMHx CVA, HTN, gout, DVT  Clinical Impression  Pt admitted with AMS and MI. Pt currently with functional limitations due to the deficits listed below (see PT Problem List).  Pt will benefit from skilled PT to increase their independence and safety with mobility to allow discharge to the venue listed below. Family very supportive and feel pt will cognitively and functionally do best in his home environment.      PT Assessment  Patient needs continued PT services    Follow Up Recommendations  Home health PT;Supervision/Assistance - 24 hour    Does the patient have the potential to tolerate intense rehabilitation      Barriers to Discharge Inaccessible home environment 2 steps to enter; currently too weak    Equipment Recommendations  None recommended by PT    Recommendations for Other Services     Frequency Min 3X/week    Precautions / Restrictions Precautions Precautions: Fall Restrictions Weight Bearing Restrictions: No   Pertinent Vitals/Pain Reported Rt knee pain is better today; could not rate 0-10       Mobility  Bed Mobility Overal bed mobility: Needs Assistance Bed Mobility: Supine to Sit Supine to sit: Supervision;HOB elevated General bed mobility comments: pt sleeps in his recliner/lift chair; pt very determined to get OOB on his own and used rail and HOB elevated Transfers Overall transfer level: Needs assistance Equipment used: Rolling walker (2 wheeled) Transfers: Sit to/from Stand Sit to Stand: Min assist General transfer  comment: pt accustomed to a lift chair; able to stand from elevated bed, assist to stand from regular height chair with armrests; x 3 Ambulation/Gait Ambulation/Gait assistance: Mod assist Ambulation Distance (Feet): 80 Feet (30 x 1; 80 x1 (seated rest)) Assistive device: Rolling walker (2 wheeled) Gait Pattern/deviations: Step-through pattern;Decreased stride length;Shuffle;Antalgic;Trunk flexed General Gait Details: Pt pushes RW too far ahead and then becomes anxious because he feels it is "running away from him." MOst of the session he required min assist, however when he let go of RW for window ledge, he required mod assist to stand upright and put both hands back on RW.    Exercises     PT Diagnosis: Difficulty walking;Generalized weakness  PT Problem List: Decreased strength;Decreased activity tolerance;Decreased balance;Decreased mobility;Decreased cognition;Decreased knowledge of use of DME;Decreased safety awareness PT Treatment Interventions: DME instruction;Gait training;Stair training;Functional mobility training;Therapeutic activities;Therapeutic exercise;Balance training;Cognitive remediation;Patient/family education     PT Goals(Current goals can be found in the care plan section) Acute Rehab PT Goals Patient Stated Goal: to get stronger and go home PT Goal Formulation: With patient/family Time For Goal Achievement: 03/20/13 Potential to Achieve Goals: Good  Visit Information  Last PT Received On: 03/13/13 Assistance Needed: +1 History of Present Illness: Presented to the ED with chief complaint of altered mental status, as per family patient became confused and had an assisted fall to the floor in the bathroom. +NSTEMI. Rt knee pain ?gout vs arthritis PMHx CVA, HTN, gout, DVT       Prior Functioning  Home Living Family/patient expects to be discharged to:: Private residence Living Arrangements: Spouse/significant other;Non-relatives/Friends Available  Help at  Discharge: Family;Personal care attendant;Available 24 hours/day Type of Home: House Home Access: Stairs to enter [employer] of Steps: 2 Entrance Stairs-Rails: Left Home Layout: One level Home Equipment: Bedside commode;Walker - 2 wheels;Tub bench;Wheelchair - manual (lift chair) Additional Comments: brother present and assisted with history Prior Function Level of Independence: Needs assistance Gait / Transfers Assistance Needed: uses RW and minguard assist; brother reports he's fallen at least twice in past 6 months ADL's / Homemaking Assistance Needed: states he does not get into shower anymore Communication Communication: HOH    Cognition  Cognition Arousal/Alertness: Awake/alert Behavior During Therapy: Anxious Overall Cognitive Status: Impaired/Different from baseline Area of Impairment: Following commands;Safety/judgement;Problem solving Following Commands: Follows one step commands inconsistently Safety/Judgement: Decreased awareness of safety Problem Solving: Slow processing;Requires verbal cues;Requires tactile cues General Comments: Pt anxious re: walking on our "slick floors." became very anxious as returning to his room (? due to incr fatigue) and let go of one side of RW to hold onto window ledge. Pt leaning forward with hips flexed nearly 90 degrees to support himself on window ledge and required +2 to get him to stand upright and put both hands back on the RW to turn and sit in the chair.    Extremity/Trunk Assessment Upper Extremity Assessment Upper Extremity Assessment: Generalized weakness Lower Extremity Assessment Lower Extremity Assessment: Generalized weakness Cervical / Trunk Assessment Cervical / Trunk Assessment: Kyphotic   Balance Balance Overall balance assessment: Needs assistance Sitting-balance support: No upper extremity supported;Feet supported Sitting balance-Leahy Scale: Good Standing balance support: Bilateral upper extremity  supported Standing balance-Leahy Scale: Poor General Comments General comments (skin integrity, edema, etc.): Brother present throughout and reports the family is very close and everyone stops in and helps the sitters as needed.   End of Session PT - End of Session Equipment Utilized During Treatment: Gait belt Activity Tolerance: Patient limited by fatigue Patient left: in chair;with call bell/phone within reach;with chair alarm set;with family/visitor present Nurse Communication: Mobility status;Other (comment) (chair alarm placed; brother present)  GP     Enid Maultsby 03/13/2013, 11:42 AM Pager [phone]

## 2013-03-13 NOTE — Progress Notes (Signed)
Utilization Review Completed.[name] T2/16/2015  

## 2013-03-13 NOTE — Progress Notes (Signed)
[facility] PROGRESS NOTE  [name] [id number] DOB: December 03, 1931 DOA: 03/11/2013 PCP: Leonides Grills, MD  Assessment/Plan: Non-STEMI  -cath 3/14 with nonobstructive CAD -cards following,  -continue aspirin, Lopressor, Zocor -currently on Pradaxa -ECHO with preserved EF and wall motion -continue med mgt  Fever/metabolic encephalopathy -likely due to fever and gouty flare   -seen by ORtho suspect Gouty flare and didn't think he needed arthrocentesis -continue prednisone for 4 days -no other clear source of infection at this time, UA/CXR unremarkable -mentation back to baseline  R knee and ankle gouty flare -see above  Mastoid sinusitis -fever could be related to this too -continue elavquin for 5 days  Atrial fibrillation  Rate in 40s overnight D/w dr.Jordan Stop diltiazem continue Lopressor and pradaxa   Hypertension  stable, continue Lopressor and Cardizem   Diabetes mellitus  Continue sliding scale insulin. Hold Glucotrol XL at this time.   DVT proph: on anticoagulation with pradaxa  Ambulate, Pt eval  Code Status: Full Code Family Communication:d/w brother at bedside Disposition Plan: transfer to tele   Consultants:  Cards pending  Ortho pending  Antibiotics:  levaquin  HPI/Subjective: Denies any chest pain, or dyspnea Was able to tell me that he almost fell yesterday   Objective: Filed Vitals:   03/13/13 0758  BP: 129/61  Pulse: 65  Temp: 98.2 F (36.8 C)  Resp: 18    Intake/Output Summary (Last 24 hours) at 03/13/13 0826 Last data filed at 03/13/13 0600  Gross per 24 hour  Intake 1992.33 ml  Output    465 ml  Net 1527.33 ml   Filed Weights   03/12/13 0026  Weight: 101.8 kg (224 lb 6.9 oz)    Exam:   General:  Alert, awake, oriented to self, place and person  Cardiovascular: S1S2/RRR  Respiratory: CTAB  Abdomen: soft, Nt, BS present  Musculoskeletal: R knee effusion/erythema with severe tenderness,  limited RoM  Data Reviewed: Basic Metabolic Panel:  Recent Labs Lab 03/11/13 1828 03/12/13 0928 03/13/13 0430  NA 139 144 137  K 3.4* 3.7 4.4  CL 102 109 103  CO2 24 21 17*  GLUCOSE 161* 85 134*  BUN 14 18 25*  CREATININE 1.08 1.09 1.21  CALCIUM 10.2 9.9 9.7   Liver Function Tests:  Recent Labs Lab 03/11/13 1828 03/12/13 0928  AST 39* 43*  ALT 12 11  ALKPHOS 73 61  BILITOT 0.5 0.5  PROT 6.4 5.5*  ALBUMIN 3.0* 2.7*   No results found for this basename: LIPASE, AMYLASE,  in the last 168 hours No results found for this basename: AMMONIA,  in the last 168 hours CBC:  Recent Labs Lab 03/11/13 1828 03/12/13 0928 03/13/13 0430  WBC 10.9* 11.2* 11.3*  NEUTROABS 8.3*  --   --   HGB 12.8* 11.2* 11.2*  HCT 38.5* 33.7* 33.4*  MCV 101.6* 101.8* 100.6*  PLT 182 148* 150   Cardiac Enzymes:  Recent Labs Lab 03/11/13 1828 03/12/13 0036 03/12/13 0928  TROPONINI 3.95* 4.89* 4.33*   BNP (last 3 results)  Recent Labs  05/04/12 2006  PROBNP 353.7   CBG:  Recent Labs Lab 03/12/13 0809 03/12/13 1217 03/12/13 1626 03/12/13 2124 03/13/13 0759  GLUCAP 84 92 85 91 130*    Recent Results (from the past 240 hour(s))  CULTURE, BLOOD (ROUTINE X 2)     Status: None   Collection Time    03/11/13  8:34 PM      Result Value Ref Range Status   Specimen Description  LEFT ANTECUBITAL   Final   Special Requests BOTTLES DRAWN AEROBIC AND ANAEROBIC 8CC   Final   Culture NO GROWTH 1 DAY   Final   Report Status PENDING   Incomplete  CULTURE, BLOOD (ROUTINE X 2)     Status: None   Collection Time    03/11/13  8:34 PM      Result Value Ref Range Status   Specimen Description LEFT ANTECUBITAL   Final   Special Requests BOTTLES DRAWN AEROBIC ONLY 8CC    Final   Culture NO GROWTH 1 DAY   Final   Report Status PENDING   Incomplete  MRSA PCR SCREENING     Status: None   Collection Time    03/12/13 12:31 AM      Result Value Ref Range Status   MRSA by PCR NEGATIVE   NEGATIVE Final   Comment:            The GeneXpert MRSA Assay (FDA     approved for NASAL specimens     only), is one component of a     comprehensive MRSA colonization     surveillance program. It is not     intended to diagnose MRSA     infection nor to guide or     monitor treatment for     MRSA infections.     Studies: Dg Pelvis 1-2 Views  03/11/2013   CLINICAL DATA:  Fall  EXAM: PELVIS - 1-2 VIEW  COMPARISON:  None.  FINDINGS: There is no evidence of pelvic fracture or diastasis. No other pelvic bone lesions are seen. Suboptimal visualization due to body habitus and technique. Clips project over the pelvis. Left total hip arthroplasty is grossly unremarkable but incompletely visualized.  IMPRESSION: No displaced pelvic fracture allowing for technique as above.   Electronically Signed   By: Conchita Paris M.D.   On: 03/11/2013 19:22   Dg Elbow Complete Left  03/11/2013   CLINICAL DATA:  Fall  EXAM: LEFT ELBOW - COMPLETE 3+ VIEW  COMPARISON:  None.  FINDINGS: There is no evidence of fracture, dislocation, or joint effusion. There is no evidence of arthropathy or other focal bone abnormality. Soft tissues are unremarkable. The patient was unable to cooperate for optimal positioning.  IMPRESSION: Negative.   Electronically Signed   By: Conchita Paris M.D.   On: 03/11/2013 19:21   Dg Elbow Complete Right  03/11/2013   CLINICAL DATA:  Fall  EXAM: RIGHT ELBOW - COMPLETE 3+ VIEW  COMPARISON:  None.  FINDINGS: There is no evidence of fracture, dislocation, or joint effusion. There is no evidence of arthropathy or other focal bone abnormality. Soft tissues are unremarkable. The patient was unable to be properly positioned for all views.  IMPRESSION: Negative.   Electronically Signed   By: Conchita Paris M.D.   On: 03/11/2013 19:22   Ct Head Wo Contrast  03/11/2013   CLINICAL DATA:  Fall, altered mental status and headache  EXAM: CT HEAD WITHOUT CONTRAST  TECHNIQUE: Contiguous axial images were  obtained from the base of the skull through the vertex without intravenous contrast.  COMPARISON:  Prior CT scan of the head 11/27/2012  FINDINGS: Negative for acute intracranial hemorrhage, acute infarction, mass, mass effect, hydrocephalus or midline shift. Gray-white differentiation is preserved throughout. No acute soft tissue or calvarial abnormality. Stable cerebral and cerebellar volume loss with compensatory ex vacuo dilatation of the lateral ventricles. Mild periventricular white matter hypoattenuation consistent with longstanding microvascular ischemic white  matter changes. Chronic partial right mastoid effusion. The left mastoid air cells are well aerated. Partial opacification of the left sphenoid sinus. This is a new finding compared to prior. Atherosclerotic calcification in the bilateral cavernous and supra clinoid carotid arteries.  IMPRESSION: 1. No acute intracranial abnormality. 2. Developing partial opacification of the left sphenoid sinus consistent with inflammatory paranasal sinus disease. 3. Chronic partial right mastoid opacification. Query chronic mastoiditis? 4. Stable cerebral and cerebellar atrophy, mild chronic ischemic white matter changes and intracranial atherosclerosis.   Electronically Signed   By: Jacqulynn Cadet M.D.   On: 03/11/2013 19:34   Dg Chest Port 1 View  03/11/2013   CLINICAL DATA:  Non ST elevation myocardial infarction  EXAM: PORTABLE CHEST - 1 VIEW  COMPARISON:  Prior chest x-ray 11/06/2012  FINDINGS: Stable cardiac and mediastinal contours with mild cardiomegaly. Atherosclerotic calcification noted in the transverse aorta. Negative for pulmonary edema. No pneumothorax, focal airspace consolidation or pleural effusion. No acute osseous abnormality.  IMPRESSION: Stable cardiomegaly without evidence of failure.   Electronically Signed   By: Jacqulynn Cadet M.D.   On: 03/11/2013 19:56   Dg Knee Complete 4 Views Right  03/11/2013   CLINICAL DATA:  Fall  EXAM:  RIGHT KNEE - COMPLETE 4+ VIEW  COMPARISON:  None.  FINDINGS: There is no evidence of fracture, dislocation, or joint effusion. There is no evidence of arthropathy or other focal bone abnormality. Soft tissues are unremarkable. Tricompartmental degenerative change is noted. Detail is obscured by patient inability to remain still for the examination. Vascular calcifications are noted. No suprapatellar effusion.  IMPRESSION: Negative.   Electronically Signed   By: Conchita Paris M.D.   On: 03/11/2013 19:21    Scheduled Meds: . aspirin EC  81 mg Oral Daily  . atorvastatin  10 mg Oral q1800  . dabigatran  150 mg Oral BID  . insulin aspart  0-9 Units Subcutaneous TID WC  . levofloxacin  500 mg Oral QHS  . metoprolol  50 mg Oral BID  . moxifloxacin  1 drop Left Eye Q6H  . predniSONE  30 mg Oral Q breakfast  . sodium chloride  1 drop Left Eye BID  . sodium chloride  3 mL Intravenous Q12H   Continuous Infusions:   Principal Problem:   NSTEMI (non-ST elevated myocardial infarction) Active Problems:   Hypertension   Atrial fibrillation   CAD (coronary artery disease)    Time spent: 16min    [facility] Pager [phone] If 7PM-7AM, please contact night-coverage at [email or web address], password [facility] 03/13/2013, 8:26 AM  LOS: 2 days

## 2013-03-13 NOTE — Progress Notes (Signed)
Received transfer from 2 H, pt resting in bed, denies discomfort foley dc per report at 2200 on 2/15. Bladder scan performed at this time 319 ml. Condom cath in place no drainage in bag. Will continue to monitor per protocol Alfonzo Feller

## 2013-03-14 LAB — GLUCOSE, CAPILLARY
GLUCOSE-CAPILLARY: 155 mg/dL — AB (ref 70–99)
GLUCOSE-CAPILLARY: 167 mg/dL — AB (ref 70–99)
Glucose-Capillary: 137 mg/dL — ABNORMAL HIGH (ref 70–99)
Glucose-Capillary: 224 mg/dL — ABNORMAL HIGH (ref 70–99)

## 2013-03-14 LAB — BASIC METABOLIC PANEL
BUN: 36 mg/dL — ABNORMAL HIGH (ref 6–23)
CHLORIDE: 104 meq/L (ref 96–112)
CO2: 22 meq/L (ref 19–32)
Calcium: 9.9 mg/dL (ref 8.4–10.5)
Creatinine, Ser: 1.23 mg/dL (ref 0.50–1.35)
GFR calc non Af Amer: 54 mL/min — ABNORMAL LOW (ref >90)
GFR, EST AFRICAN AMERICAN: 63 mL/min — AB (ref >90)
Glucose, Bld: 170 mg/dL — ABNORMAL HIGH (ref 70–99)
POTASSIUM: 4.5 meq/L (ref 3.7–5.3)
Sodium: 141 mEq/L (ref 137–147)

## 2013-03-14 LAB — CBC
HCT: 32.2 % — ABNORMAL LOW (ref 39.0–52.0)
HEMOGLOBIN: 11.1 g/dL — AB (ref 13.0–17.0)
MCH: 33.8 pg (ref 26.0–34.0)
MCHC: 34.5 g/dL (ref 30.0–36.0)
MCV: 98.2 fL (ref 78.0–100.0)
Platelets: 158 10*3/uL (ref 150–400)
RBC: 3.28 MIL/uL — AB (ref 4.22–5.81)
RDW: 14.4 % (ref 11.5–15.5)
WBC: 13.6 10*3/uL — ABNORMAL HIGH (ref 4.0–10.5)

## 2013-03-14 MED ORDER — DILTIAZEM HCL ER COATED BEADS 120 MG PO CP24
120.0000 mg | ORAL_CAPSULE | Freq: Every day | ORAL | Status: DC
  Filled 2013-03-14: qty 1

## 2013-03-14 MED ORDER — DILTIAZEM HCL ER COATED BEADS 180 MG PO CP24
180.0000 mg | ORAL_CAPSULE | Freq: Every day | ORAL | Status: DC
  Administered 2013-03-14 – 2013-03-23 (×10): 180 mg via ORAL
  Filled 2013-03-14 (×11): qty 1

## 2013-03-14 MED ORDER — PREDNISONE 5 MG PO TABS
25.0000 mg | ORAL_TABLET | Freq: Every day | ORAL | Status: DC
  Administered 2013-03-15: 25 mg via ORAL
  Filled 2013-03-14 (×2): qty 1

## 2013-03-14 NOTE — Progress Notes (Signed)
Physical Therapy Treatment Patient Details Name: JAHKI WITHAM MRN: [id number] DOB: 03/30/1931 Today's Date: 03/14/2013 Time: 8119-1478 PT Time Calculation (min): 28 min  PT Assessment / Plan / Recommendation  History of Present Illness Presented to the ED with chief complaint of altered mental status, as per family patient became confused and had an assisted fall to the floor in the bathroom. +NSTEMI. Rt knee pain ?gout vs arthritis PMHx CVA, HTN, gout, DVT   PT Comments   Pt pleasantly confused. Following all commands, however continues to think he is at his home (despite frequent re-orientation). Did recall that he is afraid to walk on our "slick floors" without his shoes (which his family has been unable to bring to hospital as of yet).    Follow Up Recommendations  Home health PT;Supervision/Assistance - 24 hour     Does the patient have the potential to tolerate intense rehabilitation     Barriers to Discharge        Equipment Recommendations  None recommended by PT    Recommendations for Other Services    Frequency Min 3X/week   Progress towards PT Goals Progress towards PT goals: Progressing toward goals  Plan Current plan remains appropriate    Precautions / Restrictions Precautions Precautions: Fall   Pertinent Vitals/Pain HR 109-128     Mobility  Bed Mobility Overal bed mobility: Needs Assistance Bed Mobility: Supine to Sit Supine to sit: HOB elevated;Min guard General bed mobility comments: pt sleeps in his recliner/lift chair at home and does not perform bed mobility usually; pt very determined to get OOB on his own and used rail and HOB elevated; lost his balance falling back against pillows x 2 Transfers Overall transfer level: Needs assistance Equipment used: Rolling walker (2 wheeled) Transfers: Sit to/from [facility] Sit to Stand: Min assist Stand pivot transfers: Min assist General transfer comment: pt accustomed to a lift chair; able  to stand from bed at lowest height and from recliner, assist to stand from regular height chair with armrests; x 3 Ambulation/Gait General Gait Details: deferred due to pt's fear 2/16 of slipping on the floor and "freezing"/unable to move once he became scared. Shoes not yet here (family to bring and currently inclement weather has delayed them)    Exercises General Exercises - Lower Extremity Long Arc Quad: AROM;Both;10 reps;Seated Hip Flexion/Marching: AROM;Both;20 reps;Seated;Standing Toe Raises: AROM;Both;10 reps;Seated Heel Raises: AROM;Both;10 reps;Seated   PT Diagnosis:    PT Problem List:   PT Treatment Interventions:     PT Goals (current goals can now be found in the care plan section) Acute Rehab PT Goals Patient Stated Goal: to get stronger and go home  Visit Information  Last PT Received On: 03/14/13 Assistance Needed: +1 History of Present Illness: Presented to the ED with chief complaint of altered mental status, as per family patient became confused and had an assisted fall to the floor in the bathroom. +NSTEMI. Rt knee pain ?gout vs arthritis PMHx CVA, HTN, gout, DVT    Subjective Data  Patient Stated Goal: to get stronger and go home   Cognition  Cognition Arousal/Alertness: Awake/alert Behavior During Therapy: WFL for tasks assessed/performed Overall Cognitive Status: Impaired/Different from baseline Area of Impairment: Following commands;Safety/judgement;Orientation;Memory Orientation Level: Place (4 times thought he was at home despite re-orienting) Memory: Decreased short-term memory Following Commands: Follows one step commands consistently Safety/Judgement: Decreased awareness of safety Problem Solving: Requires verbal cues;Requires tactile cues General Comments: Pt without family present. Thinks he's at his home  and can't understand why I can't find his shoes in his closet. When re-oriented will acknowledge that he's in the hospital and a bit confused,  however shortly forgets and again thinks he's at home.    Balance  Balance Sitting balance-Leahy Scale: Good Standing balance-Leahy Scale: Poor  End of Session PT - End of Session Equipment Utilized During Treatment: Gait belt Activity Tolerance: Patient limited by fatigue Patient left: in chair;with call bell/phone within reach;with chair alarm set Nurse Communication: Mobility status;Other (comment) (chair alarm placed; more confused today)   GP     Shuayb Schepers 03/14/2013, 10:26 AM Pager [phone]

## 2013-03-14 NOTE — Progress Notes (Signed)
PT CONFUSION HAS GOTTEN WORSE. NURSG MOVED PT TO CAMERA ROOM. INFORMED FAMILY MEMBERS. PT CONTINUES TO ANSWERS MOST QUESTIONS APPROPRIATELY BUT THEN, SPEAKS RANDOMLY ABOUT UNTRUE EVENTS. ORIENTED PT TO PLACE AND TIME. FAMILY ASSISTED IN REORIENTING PT OVER THE PHONE. MONITORING WILL CONTINUE.

## 2013-03-14 NOTE — Progress Notes (Addendum)
[facility] PROGRESS NOTE  [name] MWN:[id number] DOB: April 08, 1931 DOA: 03/11/2013 PCP: Leonides Grills, MD Brief Narrative:  80 year old male with history of Hypertension; Gout; Hyperlipidemia;  Stroke, Prostate cancer; Renal cyst; PAF (paroxysmal atrial fibrillation); and CAD presented to the ED with altered mental status, as per family patient became confused and had an assisted fall to the floor in the bathroom. Patient was somnolent but arousable..  CT head done shows chronic partial right mastoid opacification,? Chronic mastoiditis in the ED He had a temp of 101.5 in the ED. He was found to have elevated troponins and was seen by Cardiology. He was also noted to have R knee redness and swelling and seen by ORthpedics who suspected GOuty flare and was started on steroids. His mentation improved by Day 2 with some waxing and waning   Assessment/Plan: Non-STEMI  -cath 3/14 with nonobstructive CAD -cards following,  -continue aspirin, Lopressor, Zocor -currently on Pradaxa -ECHO with preserved EF and wall motion -continue med mgt  Fever/metabolic encephalopathy -likely due to fever and gouty flare   -seen by ORtho suspect Gouty flare and didn't think he needed arthrocentesis -continue prednisone for 2 more days -no other clear source of infection at this time, UA/CXR unremarkable -mentation back to baseline, afebrile  R knee and ankle gouty flare -see above  Mastoid sinusitis -fever on admission could be related to this too -continue levaquin for 5 days  Atrial fibrillation with bradycardia now RVR -Rate in 40s overnight 2/15 hence diltiazem stopped 2/16am per cards recomendations -But through the day  2/16 HR elevated in 90s-110, now in 110-120 -I called and d/w Dr.Jordan this am, Will restart Diltiazem and monitor response -continue Lopressor and pradaxa   Hypertension  stable, continue Lopressor and Cardizem   Diabetes mellitus  Continue sliding scale  insulin. Hold Glucotrol XL at this time.   LEukocytosis -suspect related to steroids now -afebrile, monitor, Blood Cx negative  DVT proph: on anticoagulation with pradaxa  Ambulate, Pt eval  Code Status: Full Code Family Communication:d/w brother at bedside Disposition Plan: home when improved   Consultants:  Cards pending  Ortho pending  Antibiotics:  levaquin  HPI/Subjective: No complaints , knee improving  Objective: Filed Vitals:   03/14/13 0405  BP: 123/80  Pulse: 103  Temp: 97.2 F (36.2 C)  Resp: 18    Intake/Output Summary (Last 24 hours) at 03/14/13 0910 Last data filed at 03/14/13 0700  Gross per 24 hour  Intake    360 ml  Output   1875 ml  Net  -1515 ml   Filed Weights   03/12/13 0026  Weight: 101.8 kg (224 lb 6.9 oz)    Exam:   General:  Alert, awake, oriented to self, place and person  Cardiovascular: S1S2/RRR  Respiratory: CTAB  Abdomen: soft, Nt, BS present  Musculoskeletal: R knee effusion/erythema improving, RoM improving  Data Reviewed: Basic Metabolic Panel:  Recent Labs Lab 03/11/13 1828 03/12/13 0928 03/13/13 0430 03/14/13 0400  NA 139 144 137 141  K 3.4* 3.7 4.4 4.5  CL 102 109 103 104  CO2 24 21 17* 22  GLUCOSE 161* 85 134* 170*  BUN 14 18 25* 36*  CREATININE 1.08 1.09 1.21 1.23  CALCIUM 10.2 9.9 9.7 9.9   Liver Function Tests:  Recent Labs Lab 03/11/13 1828 03/12/13 0928  AST 39* 43*  ALT 12 11  ALKPHOS 73 61  BILITOT 0.5 0.5  PROT 6.4 5.5*  ALBUMIN 3.0* 2.7*   No results found for  this basename: LIPASE, AMYLASE,  in the last 168 hours No results found for this basename: AMMONIA,  in the last 168 hours CBC:  Recent Labs Lab 03/11/13 1828 03/12/13 0928 03/13/13 0430 03/14/13 0400  WBC 10.9* 11.2* 11.3* 13.6*  NEUTROABS 8.3*  --   --   --   HGB 12.8* 11.2* 11.2* 11.1*  HCT 38.5* 33.7* 33.4* 32.2*  MCV 101.6* 101.8* 100.6* 98.2  PLT 182 148* 150 158   Cardiac Enzymes:  Recent  Labs Lab 03/11/13 1828 03/12/13 0036 03/12/13 0928  TROPONINI 3.95* 4.89* 4.33*   BNP (last 3 results)  Recent Labs  05/04/12 2006  PROBNP 353.7   CBG:  Recent Labs Lab 03/13/13 0759 03/13/13 1120 03/13/13 1633 03/13/13 2107 03/14/13 0613  GLUCAP 130* 174* 259* 187* 137*    Recent Results (from the past 240 hour(s))  CULTURE, BLOOD (ROUTINE X 2)     Status: None   Collection Time    03/11/13  8:34 PM      Result Value Ref Range Status   Specimen Description LEFT ANTECUBITAL   Final   Special Requests BOTTLES DRAWN AEROBIC AND ANAEROBIC 8CC   Final   Culture NO GROWTH 2 DAYS   Final   Report Status PENDING   Incomplete  CULTURE, BLOOD (ROUTINE X 2)     Status: None   Collection Time    03/11/13  8:34 PM      Result Value Ref Range Status   Specimen Description LEFT ANTECUBITAL   Final   Special Requests BOTTLES DRAWN AEROBIC ONLY 8CC    Final   Culture NO GROWTH 2 DAYS   Final   Report Status PENDING   Incomplete  MRSA PCR SCREENING     Status: None   Collection Time    03/12/13 12:31 AM      Result Value Ref Range Status   MRSA by PCR NEGATIVE  NEGATIVE Final   Comment:            The GeneXpert MRSA Assay (FDA     approved for NASAL specimens     only), is one component of a     comprehensive MRSA colonization     surveillance program. It is not     intended to diagnose MRSA     infection nor to guide or     monitor treatment for     MRSA infections.     Studies: No results found.  Scheduled Meds: . atorvastatin  10 mg Oral q1800  . dabigatran  150 mg Oral BID  . diltiazem  120 mg Oral Daily  . insulin aspart  0-9 Units Subcutaneous TID WC  . levofloxacin  500 mg Oral QHS  . metoprolol  50 mg Oral BID  . moxifloxacin  1 drop Left Eye Q6H  . predniSONE  30 mg Oral Q breakfast  . sodium chloride  1 drop Left Eye BID   Continuous Infusions:   Principal Problem:   NSTEMI (non-ST elevated myocardial infarction) Active Problems:    Hypertension   Atrial fibrillation   CAD (coronary artery disease)    Time spent: 25min    [facility] Pager [phone] If 7PM-7AM, please contact night-coverage at [email or web address], password [facility] 03/14/2013, 9:10 AM  LOS: 3 days

## 2013-03-15 LAB — GLUCOSE, CAPILLARY
GLUCOSE-CAPILLARY: 111 mg/dL — AB (ref 70–99)
GLUCOSE-CAPILLARY: 149 mg/dL — AB (ref 70–99)
Glucose-Capillary: 89 mg/dL (ref 70–99)
Glucose-Capillary: 245 mg/dL — ABNORMAL HIGH (ref 70–99)

## 2013-03-15 MED ORDER — PREDNISONE 10 MG PO TABS
10.0000 mg | ORAL_TABLET | Freq: Every day | ORAL | Status: DC
  Administered 2013-03-16 – 2013-03-22 (×7): 10 mg via ORAL
  Filled 2013-03-15 (×8): qty 1

## 2013-03-15 NOTE — Progress Notes (Signed)
[facility] PROGRESS NOTE  [name] UXN:[id number] DOB: 01-Jan-1932 DOA: 03/11/2013 PCP: Leonides Grills, MD  Assessment/Plan: Non-STEMI  -cath 3/14 with nonobstructive CAD  -cards following,  -continue aspirin, Lopressor, Zocor  -currently on Pradaxa  -ECHO with preserved EF and wall motion  -continue med mgt   Fever/metabolic encephalopathy  -likely due to fever and gouty flare  -seen by ORtho suspect Gouty flare and didn't think he needed arthrocentesis  -continue prednisone for 2 more days  -no other clear source of infection at this time, UA/CXR unremarkable  - Family states the patient at times has been hallucinating. Reportedly his mentation is improved today.  R knee and ankle gouty flare  -see above   Mastoid sinusitis  -fever on admission could be related to this too  -continue levaquin for 5 days  - Could be causing alteration in mental status  Atrial fibrillation with bradycardia now RVR  -Rate in 40s overnight 2/15 hence diltiazem stopped 2/16am per cards recomendations  -But through the day 2/16 HR elevated in 90s-110, now in 110-120  -I called and d/w Dr.Jordan this am, Will restart Diltiazem and monitor response  -continue Lopressor and pradaxa   Hypertension  stable, continue Lopressor and Cardizem   Diabetes mellitus  Continue sliding scale insulin.  - Hold Glucotrol XL while in house  LEukocytosis  -suspect related to steroids now  -afebrile, monitor, Blood Cx negative   DVT proph: on anticoagulation with pradaxa  Code Status: Full Family Communication: Discussed Disposition Plan: Pending improvement in mentation   Consultants:  Orthopedics   Cardiology  Procedures:  None  Antibiotics:  Levaquin  HPI/Subjective: The patient has been more alert today per family. But yesterday patient was reportedly hallucinating.  Objective: Filed Vitals:   03/15/13 1500  BP: 111/68  Pulse: 93  Temp: 97.3 F (36.3 C)  Resp: 16     Intake/Output Summary (Last 24 hours) at 03/15/13 1728 Last data filed at 03/15/13 1230  Gross per 24 hour  Intake    600 ml  Output   1550 ml  Net   -950 ml   Filed Weights   03/12/13 0026  Weight: 101.8 kg (224 lb 6.9 oz)    Exam:   General: Patient in no acute distress, alert  Cardiovascular: Irregularly irregular, no murmurs  Respiratory: Clear to auscultation bilaterally, no wheezes  Abdomen: Soft, nondistended, P.  Musculoskeletal: No cyanosis or clubbing  Data Reviewed: Basic Metabolic Panel:  Recent Labs Lab 03/11/13 1828 03/12/13 0928 03/13/13 0430 03/14/13 0400  NA 139 144 137 141  K 3.4* 3.7 4.4 4.5  CL 102 109 103 104  CO2 24 21 17* 22  GLUCOSE 161* 85 134* 170*  BUN 14 18 25* 36*  CREATININE 1.08 1.09 1.21 1.23  CALCIUM 10.2 9.9 9.7 9.9   Liver Function Tests:  Recent Labs Lab 03/11/13 1828 03/12/13 0928  AST 39* 43*  ALT 12 11  ALKPHOS 73 61  BILITOT 0.5 0.5  PROT 6.4 5.5*  ALBUMIN 3.0* 2.7*   No results found for this basename: LIPASE, AMYLASE,  in the last 168 hours No results found for this basename: AMMONIA,  in the last 168 hours CBC:  Recent Labs Lab 03/11/13 1828 03/12/13 0928 03/13/13 0430 03/14/13 0400  WBC 10.9* 11.2* 11.3* 13.6*  NEUTROABS 8.3*  --   --   --   HGB 12.8* 11.2* 11.2* 11.1*  HCT 38.5* 33.7* 33.4* 32.2*  MCV 101.6* 101.8* 100.6* 98.2  PLT 182 148*  150 158   Cardiac Enzymes:  Recent Labs Lab 03/11/13 1828 03/12/13 0036 03/12/13 0928  TROPONINI 3.95* 4.89* 4.33*   BNP (last 3 results)  Recent Labs  05/04/12 2006  PROBNP 353.7   CBG:  Recent Labs Lab 03/14/13 1640 03/14/13 2119 03/15/13 0621 03/15/13 1127 03/15/13 1627  GLUCAP 167* 224* 111* 89 149*    Recent Results (from the past 240 hour(s))  CULTURE, BLOOD (ROUTINE X 2)     Status: None   Collection Time    03/11/13  8:34 PM      Result Value Ref Range Status   Specimen Description BLOOD LEFT ANTECUBITAL   Final    Special Requests BOTTLES DRAWN AEROBIC AND ANAEROBIC 8CC   Final   Culture NO GROWTH 4 DAYS   Final   Report Status PENDING   Incomplete  CULTURE, BLOOD (ROUTINE X 2)     Status: None   Collection Time    03/11/13  8:34 PM      Result Value Ref Range Status   Specimen Description LEFT ANTECUBITAL   Final   Special Requests BOTTLES DRAWN AEROBIC ONLY 8CC    Final   Culture NO GROWTH 4 DAYS   Final   Report Status PENDING   Incomplete  MRSA PCR SCREENING     Status: None   Collection Time    03/12/13 12:31 AM      Result Value Ref Range Status   MRSA by PCR NEGATIVE  NEGATIVE Final   Comment:            The GeneXpert MRSA Assay (FDA     approved for NASAL specimens     only), is one component of a     comprehensive MRSA colonization     surveillance program. It is not     intended to diagnose MRSA     infection nor to guide or     monitor treatment for     MRSA infections.     Studies: No results found.  Scheduled Meds: . atorvastatin  10 mg Oral q1800  . dabigatran  150 mg Oral BID  . diltiazem  180 mg Oral Daily  . insulin aspart  0-9 Units Subcutaneous TID WC  . levofloxacin  500 mg Oral QHS  . metoprolol  50 mg Oral BID  . moxifloxacin  1 drop Left Eye Q6H  . predniSONE  25 mg Oral Q breakfast  . sodium chloride  1 drop Left Eye BID   Continuous Infusions:   Principal Problem:   NSTEMI (non-ST elevated myocardial infarction) Active Problems:   Hypertension   Atrial fibrillation   CAD (coronary artery disease)    Time spent: More and 35 minutes    Velvet Bathe  [facility] Pager [phone]. If 7PM-7AM, please contact night-coverage at [email or web address], password [facility] 03/15/2013, 5:28 PM  LOS: 4 days

## 2013-03-15 NOTE — Progress Notes (Signed)
Physical Therapy Treatment Patient Details Name: [name] MRN: [id number] DOB: 1931/09/27 Today's Date: 03/15/2013 Time: 6433-2951 PT Time Calculation (min): 35 min  PT Assessment / Plan / Recommendation  History of Present Illness Presented to the ED with chief complaint of altered mental status, as per family patient became confused and had an assisted fall to the floor in the bathroom. +NSTEMI. Rt knee pain ?gout vs arthritis PMHx CVA, HTN, gout, DVT   PT Comments   Pt did much better with his shoes (less fearful of slipping on the hospital floor). Brothers present and mostly concerned about his decr cognition and safety related to this. They do feel he is walking as well as he normally does. Confirmed he does have 24 hr assist (sitters and family) at home.    Follow Up Recommendations  Home health PT;Supervision/Assistance - 24 hour     Does the patient have the potential to tolerate intense rehabilitation     Barriers to Discharge        Equipment Recommendations  None recommended by PT    Recommendations for Other Services    Frequency Min 3X/week   Progress towards PT Goals Progress towards PT goals: Progressing toward goals (goal 1 and 2 met; stairs next visit)  Plan Current plan remains appropriate    Precautions / Restrictions Precautions Precautions: Fall   Pertinent Vitals/Pain Minimal pain Rt knee (less than previous 2 days, per pt)    Mobility  Bed Mobility Overal bed mobility: Needs Assistance Bed Mobility: Supine to Sit Supine to sit: HOB elevated;Min guard General bed mobility comments: pt sleeps in his recliner/lift chair at home and does not perform bed mobility usually; pt very determined to get OOB on his own and used rail and HOB elevated Transfers Overall transfer level: Needs assistance Equipment used: Rolling walker (2 wheeled) Transfers: Sit to/from Stand Sit to Stand: Min guard Stand pivot transfers: Min guard General transfer comment: pt  accustomed to a lift chair; able to stand from bed at lowest height and from recliner; x 2 Ambulation/Gait Ambulation/Gait assistance: Min guard Ambulation Distance (Feet): 140 Feet Assistive device: Rolling walker (2 wheeled) (and his boots (pt very fearful in gripper socks)) Gait Pattern/deviations: Step-through pattern;Trunk flexed Gait velocity interpretation: Below normal speed for age/gender General Gait Details: much better with his shoes/boots due to incr confidence by pt; vc to keep RW close to his body and stand upright Stairs:  (discussed how his brothers assist him; minguard assist)    Exercises     PT Diagnosis:    PT Problem List:   PT Treatment Interventions:     PT Goals (current goals can now be found in the care plan section) Acute Rehab PT Goals Patient Stated Goal: to get stronger and go home  Visit Information  Last PT Received On: 03/15/13 Assistance Needed: +1 History of Present Illness: Presented to the ED with chief complaint of altered mental status, as per family patient became confused and had an assisted fall to the floor in the bathroom. +NSTEMI. Rt knee pain ?gout vs arthritis PMHx CVA, HTN, gout, DVT    Subjective Data  Patient Stated Goal: to get stronger and go home   Cognition  Cognition Arousal/Alertness: Awake/alert Behavior During Therapy: WFL for tasks assessed/performed Overall Cognitive Status: Impaired/Different from baseline Area of Impairment: Safety/judgement;Orientation Orientation Level: Place (per brothers, has thought he is at home this morning) Following Commands: Follows one step commands consistently Safety/Judgement: Decreased awareness of safety Problem Solving: Requires  verbal cues;Requires tactile cues General Comments: two brothers present; both feel he is requiring more assist to come to stand (although has a lift chair at home) and walking as well as usual, however are concerned re: his cognitive status and trying to get  up alone    Balance  Balance Sitting balance-Leahy Scale: Good Standing balance-Leahy Scale: Poor  End of Session PT - End of Session Equipment Utilized During Treatment: Gait belt;Other (comment) (boots) Activity Tolerance: Patient tolerated treatment well Patient left: in chair;with call bell/phone within reach;with chair alarm set;with nursing/sitter in room;with family/visitor present Nurse Communication: Mobility status;Other (comment) (noted bleeding per rectum; RN in to assess)   GP     Taysom Glymph 03/15/2013, 9:37 AM Pager [phone]

## 2013-03-16 DIAGNOSIS — K625 Hemorrhage of anus and rectum: Secondary | ICD-10-CM

## 2013-03-16 LAB — BASIC METABOLIC PANEL
BUN: 37 mg/dL — AB (ref 6–23)
CALCIUM: 9.3 mg/dL (ref 8.4–10.5)
CHLORIDE: 107 meq/L (ref 96–112)
CO2: 23 meq/L (ref 19–32)
CREATININE: 1.08 mg/dL (ref 0.50–1.35)
GFR calc Af Amer: 73 mL/min — ABNORMAL LOW (ref >90)
GFR calc non Af Amer: 63 mL/min — ABNORMAL LOW (ref >90)
GLUCOSE: 192 mg/dL — AB (ref 70–99)
Potassium: 4.3 mEq/L (ref 3.7–5.3)
Sodium: 140 mEq/L (ref 137–147)

## 2013-03-16 LAB — CBC
HEMATOCRIT: 32.5 % — AB (ref 39.0–52.0)
HEMOGLOBIN: 11 g/dL — AB (ref 13.0–17.0)
MCH: 33.5 pg (ref 26.0–34.0)
MCHC: 33.8 g/dL (ref 30.0–36.0)
MCV: 99.1 fL (ref 78.0–100.0)
Platelets: 149 10*3/uL — ABNORMAL LOW (ref 150–400)
RBC: 3.28 MIL/uL — AB (ref 4.22–5.81)
RDW: 14.5 % (ref 11.5–15.5)
WBC: 8.7 10*3/uL (ref 4.0–10.5)

## 2013-03-16 LAB — GLUCOSE, CAPILLARY
GLUCOSE-CAPILLARY: 148 mg/dL — AB (ref 70–99)
GLUCOSE-CAPILLARY: 164 mg/dL — AB (ref 70–99)
Glucose-Capillary: 109 mg/dL — ABNORMAL HIGH (ref 70–99)
Glucose-Capillary: 146 mg/dL — ABNORMAL HIGH (ref 70–99)

## 2013-03-16 LAB — CULTURE, BLOOD (ROUTINE X 2)
CULTURE: NO GROWTH
Culture: NO GROWTH

## 2013-03-16 MED ORDER — ATORVASTATIN CALCIUM 10 MG PO TABS: 10.0000 mg | ORAL_TABLET | Freq: Every day | ORAL | Status: DC

## 2013-03-16 NOTE — Progress Notes (Signed)
Patient Name: [name] [name] Date of Encounter: 03/16/2013  Principal Problem:   NSTEMI (non-ST elevated myocardial infarction) Active Problems:   Hypertension   Atrial fibrillation   CAD (coronary artery disease)   Length of Stay: 5  SUBJECTIVE Asked to reevaluate anticoagulation in the setting of new rectal bleeding. This was witnessed by nursing staff and appears mild, albeit not just a tiny amount. No sign of hemodynamic compromise. He does have a history of internal hemorrhoids. [name] is a 80 yo man extensive pmh severe OSA, Hypertension, Gout, Hyperlipidemia; hx Deep vein thrombophlebitis of left leg (11/2010); DM type 2, Stroke (08/2009; 07/2010); Prostate cancer (2013); Syncope; PAF on pradaxa/dilt/metoprolol.  Presented with CP/NSTEMI in 3/14 (peak trop 3.8) LHC 3/14 nonobstructive CAD (presented with NSTEMI - peak trop 3.8) and preserved EF  -- LM 50, LAD 50p/m, 66m/d, LCX nl, RI 60ost sup branch, RCA diff nonobs, EF 55%    CURRENT MEDS . atorvastatin  10 mg Oral q1800  . dabigatran  150 mg Oral BID  . diltiazem  180 mg Oral Daily  . insulin aspart  0-9 Units Subcutaneous TID WC  . metoprolol  50 mg Oral BID  . moxifloxacin  1 drop Left Eye Q6H  . predniSONE  10 mg Oral Q breakfast  . sodium chloride  1 drop Left Eye BID    OBJECTIVE   Intake/Output Summary (Last 24 hours) at 03/16/13 1611 Last data filed at 03/16/13 1200  Gross per 24 hour  Intake   1200 ml  Output   1875 ml  Net   -675 ml   Filed Weights   03/12/13 0026  Weight: 101.8 kg (224 lb 6.9 oz)    PHYSICAL EXAM Filed Vitals:   03/15/13 1313 03/15/13 1500 03/15/13 2007 03/16/13 0518  BP: 116/73 111/68 115/69 112/69  Pulse: 85 93 95 81  Temp: 98 F (36.7 C) 97.3 F (36.3 C) 98.6 F (37 C) 97.9 F (36.6 C)  TempSrc: Oral Oral Oral Oral  Resp: 18 16 18 18   Height:      Weight:      SpO2: 100% 99% 99% 97%   General: Alert, oriented x3, no distress Head: no evidence of trauma, PERRL,  EOMI, no exophtalmos or lid lag, no myxedema, no xanthelasma; normal ears, nose and oropharynx Neck: normal jugular venous pulsations and no hepatojugular reflux; brisk carotid pulses without delay and no carotid bruits Chest: clear to auscultation, no signs of consolidation by percussion or palpation, normal fremitus, symmetrical and full respiratory excursions Cardiovascular: normal position and quality of the apical impulse, irregular rhythm, normal first and second heart sounds, no rubs or gallops, no murmur Abdomen: no tenderness or distention, no masses by palpation, no abnormal pulsatility or arterial bruits, normal bowel sounds, no hepatosplenomegaly Extremities: no clubbing, cyanosis or edema; 2+ radial, ulnar and brachial pulses bilaterally; 2+ right femoral, posterior tibial and dorsalis pedis pulses; 2+ left femoral, posterior tibial and dorsalis pedis pulses; no subclavian or femoral bruits Neurological: grossly nonfocal  LABS  CBC  Recent Labs  03/14/13 0400 03/16/13 0327  WBC 13.6* 8.7  HGB 11.1* 11.0*  HCT 32.2* 32.5*  MCV 98.2 99.1  PLT 158 235*   Basic Metabolic Panel  Recent Labs  03/14/13 0400 03/16/13 0327  NA 141 140  K 4.5 4.3  CL 104 107  CO2 22 23  GLUCOSE 170* 192*  BUN 36* 37*  CREATININE 1.23 1.08  CALCIUM 9.9 9.3    Radiology Studies Imaging results  have been reviewed and No results found.  TELE AF with controlled rate   ASSESSMENT AND PLAN In my opinion, the risk of recurrent stroke (in this patient with vascular disease and history of TIA) still outweighs the risk of serious bleeding complications at this time. It is most likely that he has recurrent internal hemorrhoids or an anal fissure from his stool impaction, neither of which should cause life threatening bleeding. Also consider radiation related rectal problems following prostate Ca XRT. Should have a GI reevaluation , I think. I don't think it makes a big difference which  anticoagulant is used. I think a NOAC is preferable (over warfarin) with its short duration of activity   Sanda Klein, MD, [facility] [phone] office [phone] pager 03/16/2013 4:11 PM

## 2013-03-16 NOTE — Care Management Note (Signed)
    Page 1 of 2   03/23/2013     3:04:34 PM   CARE MANAGEMENT NOTE 03/23/2013  Patient:  [name], [name]   Account Number:  [id number]  Date Initiated:  03/16/2013  Documentation initiated by:  Orvell Careaga  Subjective/Objective Assessment:   PT ADM WITH AMS, NSTEMI, FALL.  PTA, PT RESIDES AT [place].  HAS 24H CAREGIVERS, PER FAMILY.     Action/Plan:   P.T. RECOMMENDING HH FOLLOW UP AT DC; MET WITH PT AND BROTHER TO DISCUSS ARRANGEMENTS.   Anticipated DC Date:  03/23/2013   Anticipated DC Plan:  [place]  CM consult      [facility] Choice  HOME HEALTH   Choice offered to / List presented to:  C-1 Patient        [place] arranged  [place] PT      [place].   Status of service:  Completed, signed off Medicare Important Message given?   (If response is "NO", the following Medicare IM given date fields will be blank) Date Medicare IM given:   Date Additional Medicare IM given:    Discharge Disposition:  [place]  Per UR Regulation:  Reviewed for med. necessity/level of care/duration of stay  If discussed at [place] of Stay Meetings, dates discussed:   03/16/2013  03/21/2013  03/23/2013    Comments:  03/23/13 [name] [phone] PT DC HOME TODAY WITH FAMILY.  NOTIFIED [place].  PT GIVEN FREE 30 DAY TRIAL CARD, AFTER ACTIVATION.  03/16/13 Scott Mcree,RN,BSN [phone] PT/BROTHER PREFER [facility] FOR HH NEEDS; REFERRAL TO [facility] FOR HH FOLLOW, PER PT CHOICE.  START OF CARE 24-48H POST DC DATE. DC HELD TODAY FOR RECTAL BLEEDING.  PT DENIES DME NEEDS.

## 2013-03-16 NOTE — Progress Notes (Addendum)
Attempted to dress pt for discharge. However upon standing pt was noted to have dark red blood dripping from his rectum on the pad and down pt's leg.  No bleeding around rectum visualised as skin is intact.  Pt was cleaned up. MD paged. Orders received to hold off on D/C, order H&H for AM, and page Cardiology in regards to anticoagulant. MD paged and to see pt. Will continue to monitor pt closely.

## 2013-03-16 NOTE — Discharge Summary (Addendum)
Physician Discharge Summary  [name] [id number] DOB: April 29, 1931 DOA: 03/11/2013  PCP: Leonides Grills, MD  Admit date: 03/11/2013 Discharge date: 03/16/2013  Time spent: > 40 minutes  Recommendations for Outpatient Follow-up:  1. Patient to follow up with primary care provider within two weeks  2. ECG without acute change. Nonobstructive CAD by cath on 04/08/13. No further workup planned. Continue statin and beta blocker. No ASA since on Pradaxa. 3. Follow up with orthopedics as outpatient as needed for osteoarthritis of bilateral knees/ankles  Discharge Diagnoses:  Principal Problem:   NSTEMI (non-ST elevated myocardial infarction) Active Problems:   Hypertension   Atrial fibrillation   CAD (coronary artery disease)   Discharge Condition: Stable  Diet recommendation: Heart Healthy/low sodium  Filed Weights   03/12/13 0026  Weight: 101.8 kg (224 lb 6.9 oz)    History of present illness:  80 year old male with history of Hypertension; Gout; Hyperlipidemia; Stroke, Prostate cancer; Renal cyst; PAF (paroxysmal atrial fibrillation); and CAD. Presented to the ED with altered mental status, as per family patient became confused and had an assisted fall to the floor in the bathroom. Patient was somnolent but arousable. CT head done shows chronic partial right mastoid opacification,? Chronic mastoiditis in the ED He had a temp of 101.5 in the ED.  He was found to have elevated troponins and was seen by Cardiology.  He was also noted to have R knee redness and swelling and seen by Orthpedics who suspected Gouty flare and was started on steroids.  His mentation improved by Day 2 with some waxing and waning.   Hospital Course:   Non-STEMI  -cath 3/14 with nonobstructive CAD  -cards following  -continue Lopressor, Zocor  -currently on Pradaxa  -ECHO with preserved EF and wall motion  -continue med mgt   Fever/metabolic encephalopathy  - likely due to fever and gouty  flare  - seen by Ortho suspect Gouty flare and didn't think he needed arthrocentesis  - Continue home dose prednisone. He was given higher dose prednisone for 2 days - no other clear source of infection at this time, UA/CXR unremarkable  - Resolved. No fevers. Mentation at baseline.  R knee and ankle gouty flare  -see above   Mastoid sinusitis  - fever on admission could be related to this  - resolved with 5 days course of levaquin (completed) - Suspecting was most likely cause of alteration in mental status   Atrial fibrillation with bradycardia now RVR  -Rate in 40s overnight 2/15 hence diltiazem stopped 2/16 am per cards recomendations  -But through the day 2/16 HR elevated in 90s-110, now in 110-120  -Called and d/w Dr.Jordan 2/17, Restarted Diltiazem and monitor response  -continue Lopressor and pradaxa   Hypertension  - stable, continue Lopressor and Cardizem   Diabetes mellitus  - Continue home regimen with routine monitoring of blood sugars.  LEukocytosis  -suspect related to steroids now resolved -afebrile, monitor, Blood Cx negative   Procedures:  ECHO  Consultations:  Orthopedics  Cardiology  Antibiotics:  Levaquin (5/5 days completed)  Discharge Exam: Filed Vitals:   03/16/13 0518  BP: 112/69  Pulse: 81  Temp: 97.9 F (36.6 C)  Resp: 18    General: Patient in no acute distress, alert and oriented Cardiovascular: irregularly irregular, no murmurs  Respiratory: Clear to auscultation bilaterally, no wheezes or rhonchi   Discharge Instructions  Discharge Orders   Future Orders Complete By Expires   Call MD for:  persistant dizziness or light-headedness  As directed    Call MD for:  severe uncontrolled pain  As directed    Call MD for:  temperature >100.4  As directed    Diet - low sodium heart healthy  As directed    Discharge instructions  As directed    Comments:     Follow up with primary care provider within the next two weeks.    Increase activity slowly  As directed        Medication List    STOP taking these medications       aspirin EC 81 MG tablet     cholecalciferol 1000 UNITS tablet  Commonly known as:  VITAMIN D     simvastatin 20 MG tablet  Commonly known as:  ZOCOR      TAKE these medications       allopurinol 300 MG tablet  Commonly known as:  ZYLOPRIM  Take 300 mg by mouth daily.     ascorbic acid 500 MG tablet  Commonly known as:  VITAMIN C  Take 1,000 mg by mouth every morning.     atorvastatin 10 MG tablet  Commonly known as:  LIPITOR  Take 1 tablet (10 mg total) by mouth daily at 6 PM.     calcium-vitamin D 500-200 MG-UNIT per tablet  Commonly known as:  OSCAL WITH D  Take 1 tablet by mouth every morning.     COD LIVER OIL PO  Take 2 tablets by mouth 2 (two) times daily.     dabigatran 150 MG Caps capsule  Commonly known as:  PRADAXA  Take 150 mg by mouth daily.     diltiazem 180 MG 24 hr capsule  Commonly known as:  CARDIZEM CD  Take 180 mg by mouth daily.     FISH OIL PO  Take 1 capsule by mouth daily.     glipiZIDE 2.5 MG 24 hr tablet  Commonly known as:  GLUCOTROL XL  Take 2.5 mg by mouth daily.     metoprolol 50 MG tablet  Commonly known as:  LOPRESSOR  Take 50 mg by mouth 2 (two) times daily.     multivitamin with minerals Tabs tablet  Take 1 tablet by mouth every morning.     nitroGLYCERIN 0.4 MG SL tablet  Commonly known as:  NITROSTAT  Place 0.4 mg under the tongue every 5 (five) minutes as needed for chest pain.     predniSONE 10 MG tablet  Commonly known as:  DELTASONE  Take 1 tablet (10 mg total) by mouth daily as needed (severe gout.). Hold this while you are on higher dose prednisone taper       Allergies  Allergen Reactions  . Tetanus Toxoids Swelling    Also allergic to "high powered pain medications."      The results of significant diagnostics from this hospitalization (including imaging, microbiology, ancillary and laboratory) are  listed below for reference.    Significant Diagnostic Studies: Dg Pelvis 1-2 Views  03/11/2013   CLINICAL DATA:  Fall  EXAM: PELVIS - 1-2 VIEW  COMPARISON:  None.  FINDINGS: There is no evidence of pelvic fracture or diastasis. No other pelvic bone lesions are seen. Suboptimal visualization due to body habitus and technique. Clips project over the pelvis. Left total hip arthroplasty is grossly unremarkable but incompletely visualized.  IMPRESSION: No displaced pelvic fracture allowing for technique as above.   Electronically Signed   By: Conchita Paris M.D.   On: 03/11/2013 19:22   Dg  Elbow Complete Left  03/11/2013   CLINICAL DATA:  Fall  EXAM: LEFT ELBOW - COMPLETE 3+ VIEW  COMPARISON:  None.  FINDINGS: There is no evidence of fracture, dislocation, or joint effusion. There is no evidence of arthropathy or other focal bone abnormality. Soft tissues are unremarkable. The patient was unable to cooperate for optimal positioning.  IMPRESSION: Negative.   Electronically Signed   By: Conchita Paris M.D.   On: 03/11/2013 19:21   Dg Elbow Complete Right  03/11/2013   CLINICAL DATA:  Fall  EXAM: RIGHT ELBOW - COMPLETE 3+ VIEW  COMPARISON:  None.  FINDINGS: There is no evidence of fracture, dislocation, or joint effusion. There is no evidence of arthropathy or other focal bone abnormality. Soft tissues are unremarkable. The patient was unable to be properly positioned for all views.  IMPRESSION: Negative.   Electronically Signed   By: Conchita Paris M.D.   On: 03/11/2013 19:22   Ct Head Wo Contrast  03/11/2013   CLINICAL DATA:  Fall, altered mental status and headache  EXAM: CT HEAD WITHOUT CONTRAST  TECHNIQUE: Contiguous axial images were obtained from the base of the skull through the vertex without intravenous contrast.  COMPARISON:  Prior CT scan of the head 11/27/2012  FINDINGS: Negative for acute intracranial hemorrhage, acute infarction, mass, mass effect, hydrocephalus or midline shift. Gray-white  differentiation is preserved throughout. No acute soft tissue or calvarial abnormality. Stable cerebral and cerebellar volume loss with compensatory ex vacuo dilatation of the lateral ventricles. Mild periventricular white matter hypoattenuation consistent with longstanding microvascular ischemic white matter changes. Chronic partial right mastoid effusion. The left mastoid air cells are well aerated. Partial opacification of the left sphenoid sinus. This is a new finding compared to prior. Atherosclerotic calcification in the bilateral cavernous and supra clinoid carotid arteries.  IMPRESSION: 1. No acute intracranial abnormality. 2. Developing partial opacification of the left sphenoid sinus consistent with inflammatory paranasal sinus disease. 3. Chronic partial right mastoid opacification. Query chronic mastoiditis? 4. Stable cerebral and cerebellar atrophy, mild chronic ischemic white matter changes and intracranial atherosclerosis.   Electronically Signed   By: Jacqulynn Cadet M.D.   On: 03/11/2013 19:34   Dg Chest Port 1 View  03/11/2013   CLINICAL DATA:  Non ST elevation myocardial infarction  EXAM: PORTABLE CHEST - 1 VIEW  COMPARISON:  Prior chest x-ray 11/06/2012  FINDINGS: Stable cardiac and mediastinal contours with mild cardiomegaly. Atherosclerotic calcification noted in the transverse aorta. Negative for pulmonary edema. No pneumothorax, focal airspace consolidation or pleural effusion. No acute osseous abnormality.  IMPRESSION: Stable cardiomegaly without evidence of failure.   Electronically Signed   By: Jacqulynn Cadet M.D.   On: 03/11/2013 19:56   Dg Knee Complete 4 Views Right  03/11/2013   CLINICAL DATA:  Fall  EXAM: RIGHT KNEE - COMPLETE 4+ VIEW  COMPARISON:  None.  FINDINGS: There is no evidence of fracture, dislocation, or joint effusion. There is no evidence of arthropathy or other focal bone abnormality. Soft tissues are unremarkable. Tricompartmental degenerative change is noted.  Detail is obscured by patient inability to remain still for the examination. Vascular calcifications are noted. No suprapatellar effusion.  IMPRESSION: Negative.   Electronically Signed   By: Conchita Paris M.D.   On: 03/11/2013 19:21    Microbiology: Recent Results (from the past 240 hour(s))  CULTURE, BLOOD (ROUTINE X 2)     Status: None   Collection Time    03/11/13  8:34 PM  Result Value Ref Range Status   Specimen Description BLOOD LEFT ANTECUBITAL   Final   Special Requests BOTTLES DRAWN AEROBIC AND ANAEROBIC 8CC   Final   Culture NO GROWTH 5 DAYS   Final   Report Status 03/16/2013 FINAL   Final  CULTURE, BLOOD (ROUTINE X 2)     Status: None   Collection Time    03/11/13  8:34 PM      Result Value Ref Range Status   Specimen Description LEFT ANTECUBITAL   Final   Special Requests BOTTLES DRAWN AEROBIC ONLY 8CC    Final   Culture NO GROWTH 5 DAYS   Final   Report Status 03/16/2013 FINAL   Final  MRSA PCR SCREENING     Status: None   Collection Time    03/12/13 12:31 AM      Result Value Ref Range Status   MRSA by PCR NEGATIVE  NEGATIVE Final   Comment:            The GeneXpert MRSA Assay (FDA     approved for NASAL specimens     only), is one component of a     comprehensive MRSA colonization     surveillance program. It is not     intended to diagnose MRSA     infection nor to guide or     monitor treatment for     MRSA infections.     Labs: Basic Metabolic Panel:  Recent Labs Lab 03/11/13 1828 03/12/13 0928 03/13/13 0430 03/14/13 0400 03/16/13 0327  NA 139 144 137 141 140  K 3.4* 3.7 4.4 4.5 4.3  CL 102 109 103 104 107  CO2 24 21 17* 22 23  GLUCOSE 161* 85 134* 170* 192*  BUN 14 18 25* 36* 37*  CREATININE 1.08 1.09 1.21 1.23 1.08  CALCIUM 10.2 9.9 9.7 9.9 9.3   Liver Function Tests:  Recent Labs Lab 03/11/13 1828 03/12/13 0928  AST 39* 43*  ALT 12 11  ALKPHOS 73 61  BILITOT 0.5 0.5  PROT 6.4 5.5*  ALBUMIN 3.0* 2.7*   No results found  for this basename: LIPASE, AMYLASE,  in the last 168 hours No results found for this basename: AMMONIA,  in the last 168 hours CBC:  Recent Labs Lab 03/11/13 1828 03/12/13 0928 03/13/13 0430 03/14/13 0400 03/16/13 0327  WBC 10.9* 11.2* 11.3* 13.6* 8.7  NEUTROABS 8.3*  --   --   --   --   HGB 12.8* 11.2* 11.2* 11.1* 11.0*  HCT 38.5* 33.7* 33.4* 32.2* 32.5*  MCV 101.6* 101.8* 100.6* 98.2 99.1  PLT 182 148* 150 158 149*   Cardiac Enzymes:  Recent Labs Lab 03/11/13 1828 03/12/13 0036 03/12/13 0928  TROPONINI 3.95* 4.89* 4.33*   BNP: BNP (last 3 results)  Recent Labs  05/04/12 2006  PROBNP 353.7   CBG:  Recent Labs Lab 03/15/13 1127 03/15/13 1627 03/15/13 2118 03/16/13 0613 03/16/13 1123  GLUCAP 89 149* 245* 148* 109*       Signed:  Shalay Carder  [facility] 03/16/2013, 1:13 PM    Addendum: Prior to discharge was called by nursing who reported that patient was having bloody BM's.  D/C cancelled. Will discuss with cardiology given that patient is on pradaxa.  Reassess hgb next am.

## 2013-03-17 LAB — CBC
HCT: 33.4 % — ABNORMAL LOW (ref 39.0–52.0)
Hemoglobin: 11.1 g/dL — ABNORMAL LOW (ref 13.0–17.0)
MCH: 32.9 pg (ref 26.0–34.0)
MCHC: 33.2 g/dL (ref 30.0–36.0)
MCV: 99.1 fL (ref 78.0–100.0)
Platelets: 162 10*3/uL (ref 150–400)
RBC: 3.37 MIL/uL — ABNORMAL LOW (ref 4.22–5.81)
RDW: 14.6 % (ref 11.5–15.5)
WBC: 10.9 10*3/uL — ABNORMAL HIGH (ref 4.0–10.5)

## 2013-03-17 LAB — HEMOGLOBIN AND HEMATOCRIT, BLOOD
HEMATOCRIT: 31.2 % — AB (ref 39.0–52.0)
HEMOGLOBIN: 10.6 g/dL — AB (ref 13.0–17.0)

## 2013-03-17 LAB — BASIC METABOLIC PANEL
BUN: 32 mg/dL — AB (ref 6–23)
CALCIUM: 9.2 mg/dL (ref 8.4–10.5)
CO2: 23 meq/L (ref 19–32)
CREATININE: 1.02 mg/dL (ref 0.50–1.35)
Chloride: 108 mEq/L (ref 96–112)
GFR calc non Af Amer: 68 mL/min — ABNORMAL LOW (ref >90)
GFR, EST AFRICAN AMERICAN: 79 mL/min — AB (ref >90)
Glucose, Bld: 138 mg/dL — ABNORMAL HIGH (ref 70–99)
Potassium: 4.2 mEq/L (ref 3.7–5.3)
Sodium: 142 mEq/L (ref 137–147)

## 2013-03-17 LAB — PROTIME-INR
INR: 2.04 — AB (ref 0.00–1.49)
PROTHROMBIN TIME: 22.4 s — AB (ref 11.6–15.2)

## 2013-03-17 LAB — GLUCOSE, CAPILLARY
GLUCOSE-CAPILLARY: 125 mg/dL — AB (ref 70–99)
GLUCOSE-CAPILLARY: 139 mg/dL — AB (ref 70–99)
Glucose-Capillary: 103 mg/dL — ABNORMAL HIGH (ref 70–99)
Glucose-Capillary: 180 mg/dL — ABNORMAL HIGH (ref 70–99)

## 2013-03-17 LAB — APTT: aPTT: 67 seconds — ABNORMAL HIGH (ref 24–37)

## 2013-03-17 MED ORDER — FREE WATER: 100.0000 mL | Freq: Three times a day (TID) | Status: DC

## 2013-03-17 MED ORDER — SODIUM CHLORIDE 0.9 % IV SOLN
INTRAVENOUS | Status: DC
  Administered 2013-03-17: 21:00:00 via INTRAVENOUS
  Administered 2013-03-18: 500 mL via INTRAVENOUS

## 2013-03-17 NOTE — Consult Note (Signed)
Subjective:   HPI  The patient is a 80 year old male who we are asked to see in consultation in regards to rectal bleeding which occurred yesterday and today. According to the nurse the patient was dripping red blood per rectum. The patient has a history of prostate cancer and was treated with radiation therapy according to him a year or so ago. Also according to the patient's brother who is here the patient was constipated last week and was digging stool out of his rectum. The patient has been on Pradaxa. His last dose was about 9:30 last night. It has been held.  Review of Systems No chest pain at this time  Past Medical History  Diagnosis Date  . Hypertension   . Gout     Acute episode involving ankle in 12/2011  . Hyperlipidemia   . Deep vein thrombophlebitis of left leg 11/2010    LLE; S/P THA  . Fasting hyperglycemia   . Stroke 08/2009; 07/2010    TIA in 07/2010  . H/O heat stroke 08/2009  . Prostate cancer 2013    adenocarcinoma; Gleason grade 8; PSA 9.45; EBRT 2013-14  . Renal cyst   . Syncope     01/2012: Negative pharmacologic stress nuclear  . PAF (paroxysmal atrial fibrillation)     a. first noted 01/2012 during admission for URI;  b. seen again 03/2012 after nstemi of unknown origin - pradaxa initiated.  Marland Kitchen CAD (coronary artery disease)     a. 01/2012 Elev Ti-->nonischemic MV;  b. 03/2012 NSTEMI/Cath: LM 50, LAD 50p/m, 25m/d, LCX nl, RI 60ost sup branch, RCA diff nonobs, EF 55%   Past Surgical History  Procedure Laterality Date  . Hip arthroplasty  12/08/2010    ARTHROPLASTY BIPOLAR HIP;  Surgeon-Olin; Left  . 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 I&d extremity  05/28/2011    Procedure: IRRIGATION AND DEBRIDEMENT EXTREMITY;  Surgeon: Linna Hoff, MD;  Location: [facility];  Service: Orthopedics;  Laterality: Right;  I & D Right hand/wrist  . Colonoscopy   07/18/2003    RMR: Rectum internal hemorrhoids.  Otherwise, normal rectum/ Left-sided  diverticula.  Remainder of colonic mucosa-nl   History   Social History  . Marital Status: Married    Spouse Name: N/A    Number of Children: 0  . Years of Education: N/A   Occupational History  . Banking     Retired  . Farming         "   Social History Main Topics  . Smoking status: Former Smoker    Types: Cigarettes, Cigars    Quit date: 01/27/1948  . Smokeless tobacco: Current User    Types: Chew     Comment: "quit smoking cigarettes age 65 or 4"  . Alcohol Use: No  . Drug Use: No  . Sexual Activity: No   Other Topics Concern  . Not on file   Social History Narrative   Lives w/ wife   family history includes Colon cancer (age of onset: 39) in his sister; Diabetes Mellitus II in his father; Prostate cancer in his brother; Stroke in his father. Current facility-administered medications:atorvastatin (LIPITOR) tablet 10 mg, 10 mg, Oral, q1800, Oswald Hillock, MD, 10 mg at 03/16/13 1815;  diltiazem (CARDIZEM CD) 24 hr capsule 180 mg, 180 mg, Oral, Daily, Domenic Polite, MD, 180 mg at 03/17/13 1121;  insulin aspart (novoLOG) injection 0-9 Units, 0-9 Units, Subcutaneous, TID  WC, Oswald Hillock, MD, 1 Units at 03/17/13 1337 metoprolol (LOPRESSOR) tablet 50 mg, 50 mg, Oral, BID, Oswald Hillock, MD, 50 mg at 03/17/13 1121;  moxifloxacin (VIGAMOX) 0.5 % ophthalmic solution 1 drop, 1 drop, Left Eye, Q6H, Erika K von Vajna, RPH, 1 drop at 03/17/13 1122;  ondansetron (ZOFRAN) tablet 4 mg, 4 mg, Oral, Q6H PRN, Oswald Hillock, MD;  predniSONE (DELTASONE) tablet 10 mg, 10 mg, Oral, Q breakfast, Velvet Bathe, MD, 10 mg at 03/17/13 0758 sodium chloride (MURO 128) 5 % ophthalmic solution 1 drop, 1 drop, Left Eye, BID, Ruta Hinds von Vajna, RPH, 1 drop at 03/17/13 1122 Allergies  Allergen Reactions  . Tetanus Toxoids Swelling    Also allergic to "high powered pain medications."     Objective:     BP 110/69  Pulse 70  Temp(Src) 98.9 F (37.2 C) (Axillary)  Resp 18  Ht 5\' 9"  (1.753 m)  Wt 101.8  kg (224 lb 6.9 oz)  BMI 33.13 kg/m2  SpO2 98%  He is in no distress  Heart regular rhythm  Lungs clear  Abdomen: Soft and nontender  Laboratory No components found with this basename: d1      Assessment:     Rectal bleeding  The patient has a history of prostate cancer and had radiation treatment he could have a radiation proctitis. He also reports constipation with attempts at manually disimpacting his own stool. He could have a rectal ulcer. He could also have internal hemorrhoids.      Plan:     I would agree with holding the Pradaxa for now. I will set him up to do a flexible sigmoidoscopy tomorrow as I think the bleeding sounds to be low in the rectum or sigmoid region. Lab Results  Component Value Date   HGB 11.1* 03/17/2013   HGB 10.6* 03/17/2013   HGB 11.0* 03/16/2013   HCT 33.4* 03/17/2013   HCT 31.2* 03/17/2013   HCT 32.5* 03/16/2013   HCT 43 11/20/2011   ALKPHOS 61 03/12/2013   ALKPHOS 73 03/11/2013   ALKPHOS 86 11/27/2012   ALKPHOS 67 11/20/2011   AST 43* 03/12/2013   AST 39* 03/11/2013   AST 21 11/27/2012   AST 25 11/20/2011   ALT 11 03/12/2013   ALT 12 03/11/2013   ALT 19 11/27/2012

## 2013-03-17 NOTE — Progress Notes (Signed)
PT Cancellation Note  Patient Details Name: [name] MRN: [id number] DOB: 1931-12-11   Cancelled Treatment:    Reason Eval/Treat Not Completed: Medical issues which prohibited therapy. Pt felt he needed to be cleaned before trying to get up. RN in to assist. Pt rolled and copious amount of blood noted on pads and sheets. Assisted with cleaning pt. Discussed with RN and agreed pt did not need to try to ambulate at this time.   Shoji Pertuit 03/17/2013, 6:19 PM Pager [phone]

## 2013-03-17 NOTE — Progress Notes (Signed)
[facility] PROGRESS NOTE  [name] BZJ:[id number] DOB: [birth year] DOA: 03/11/2013 PCP: Scott Grills, MD  Assessment/Plan: Non-STEMI  - Cath 3/14 with nonobstructive CAD  - Cards following - Continue Lopressor and Zocor  - D/c Pradaxa for GI evaluation of rectal bleeding. (Discussed with Cardiology) - ECHO with preserved EF and wall motion  - Continue med mgt   Rectal bleeding - Bright red blood noted in BM yesterday afternoon and this AM (2/20) - Hgb 11.1 and Hct 33.4 on 2/20 - INR 2.04 on 2/20 - GI consulted - Hemodynamically stable, continue to monitor - Pradaxa D/c'd until evaluated by GI, pt continues to be high risk for stroke- consider Eliquis (per cardiology) for anticoagulation after eval by GI  Fever/metabolic encephalopathy  - Likely due to fever and gouty flare  - Seen by Ortho suspect Gouty flare and didn't think he needed arthrocentesis  - Continue prednisone. He was given a higher dose for 2 days  - No other clear source of infection at this time, UA/CXR unremarkable  - Resolved. No fevers. Mentation at baseline.   R knee and ankle gouty flare  - See above   Mastoid sinusitis  - Fever on admission could be related to this too  - Resolved with 5 day course of Levaquin (completed)  - Suspected cause of alteration in mental status  Atrial fibrillation with bradycardia now RVR  - Rate in 40s overnight 2/15 hence diltiazem stopped 2/16 am per cards recomendations  - But through the day 2/16 HR elevated in 90s-110.  - Called and d/w [name] 2/17, Restarted Diltiazem. Currently HR in the 80's - Continue Lopressor and d/c'd pradaxa until seen by GI   Hypertension  - Stable, continue Lopressor and Cardizem   Diabetes mellitus  - Continue sliding scale insulin.  - Hold Glucotrol XL while in house  LEukocytosis  - Suspect related to steroids, now resolved - Afebrile, monitor, Blood Cx negative   DVT proph: SCDs, Pradaxa d/c'd until evaluated  by GI  Code Status: Full Family Communication: No family at bedside. Discussed with patient Disposition Plan: Pending evaluation and recommendations by GI   Consultants:  Orthopedics   Cardiology  GI  Procedures:  None  Antibiotics:  Levaquin 5/5 days completed on 2/18  HPI/Subjective: The patient had BMs yesterday afternoon and early this AM containing bright red blood. Pt denies any abdominal or rectal pain. RN reports that this shift the pt has had small amount of blood on his bed pad.  Objective: Filed Vitals:   03/17/13 1105  BP: 125/70  Pulse: 87  Temp:   Resp:     Intake/Output Summary (Last 24 hours) at 03/17/13 1216 Last data filed at 03/17/13 0758  Gross per 24 hour  Intake    240 ml  Output   1325 ml  Net  -1085 ml   Filed Weights   03/12/13 0026  Weight: 101.8 kg (224 lb 6.9 oz)    Exam:   General: Patient in no acute distress, alert  Cardiovascular: Irregularly irregular, no murmurs  Respiratory: Clear to auscultation bilaterally, no wheezes  Abdomen: Soft, nondistended, non-tender. Bleeding from rectum, small amount of blood visualized.  Musculoskeletal: No cyanosis or clubbing  Data Reviewed: Basic Metabolic Panel:  Recent Labs Lab 03/11/13 1828 03/12/13 0928 03/13/13 0430 03/14/13 0400 03/16/13 0327  NA 139 144 137 141 140  K 3.4* 3.7 4.4 4.5 4.3  CL 102 109 103 104 107  CO2 24 21 17* 22 23  GLUCOSE 161* 85 134* 170* 192*  BUN 14 18 25* 36* 37*  CREATININE 1.08 1.09 1.21 1.23 1.08  CALCIUM 10.2 9.9 9.7 9.9 9.3   Liver Function Tests:  Recent Labs Lab 03/11/13 1828 03/12/13 0928  AST 39* 43*  ALT 12 11  ALKPHOS 73 61  BILITOT 0.5 0.5  PROT 6.4 5.5*  ALBUMIN 3.0* 2.7*   No results found for this basename: LIPASE, AMYLASE,  in the last 168 hours No results found for this basename: AMMONIA,  in the last 168 hours CBC:  Recent Labs Lab 03/11/13 1828 03/12/13 0928 03/13/13 0430 03/14/13 0400 03/16/13 0327  03/17/13 0220  WBC 10.9* 11.2* 11.3* 13.6* 8.7  --   NEUTROABS 8.3*  --   --   --   --   --   HGB 12.8* 11.2* 11.2* 11.1* 11.0* 10.6*  HCT 38.5* 33.7* 33.4* 32.2* 32.5* 31.2*  MCV 101.6* 101.8* 100.6* 98.2 99.1  --   PLT 182 148* 150 158 149*  --    Cardiac Enzymes:  Recent Labs Lab 03/11/13 1828 03/12/13 0036 03/12/13 0928  TROPONINI 3.95* 4.89* 4.33*   BNP (last 3 results)  Recent Labs  05/04/12 2006  PROBNP 353.7   CBG:  Recent Labs Lab 03/16/13 1123 03/16/13 1604 03/16/13 2102 03/17/13 0550 03/17/13 1126  GLUCAP 109* 164* 146* 103* 125*    Recent Results (from the past 240 hour(s))  CULTURE, BLOOD (ROUTINE X 2)     Status: None   Collection Time    03/11/13  8:34 PM      Result Value Ref Range Status   Specimen Description BLOOD LEFT ANTECUBITAL   Final   Special Requests BOTTLES DRAWN AEROBIC AND ANAEROBIC 8CC   Final   Culture NO GROWTH 5 DAYS   Final   Report Status 03/16/2013 FINAL   Final  CULTURE, BLOOD (ROUTINE X 2)     Status: None   Collection Time    03/11/13  8:34 PM      Result Value Ref Range Status   Specimen Description LEFT ANTECUBITAL   Final   Special Requests BOTTLES DRAWN AEROBIC ONLY 8CC    Final   Culture NO GROWTH 5 DAYS   Final   Report Status 03/16/2013 FINAL   Final  MRSA PCR SCREENING     Status: None   Collection Time    03/12/13 12:31 AM      Result Value Ref Range Status   MRSA by PCR NEGATIVE  NEGATIVE Final   Comment:            The GeneXpert MRSA Assay (FDA     approved for NASAL specimens     only), is one component of a     comprehensive MRSA colonization     surveillance program. It is not     intended to diagnose MRSA     infection nor to guide or     monitor treatment for     MRSA infections.     Studies: No results found.  Scheduled Meds: . atorvastatin  10 mg Oral q1800  . dabigatran  150 mg Oral BID  . diltiazem  180 mg Oral Daily  . insulin aspart  0-9 Units Subcutaneous TID WC  . metoprolol   50 mg Oral BID  . moxifloxacin  1 drop Left Eye Q6H  . predniSONE  10 mg Oral Q breakfast  . sodium chloride  1 drop Left Eye BID   Continuous Infusions:  Principal Problem:   NSTEMI (non-ST elevated myocardial infarction) Active Problems:   Hypertension   Atrial fibrillation   CAD (coronary artery disease)    Time spent: > 40 minutes    Scott Sachs  [facility] Pager [phone]. If 7PM-7AM, please contact night-coverage at [email or web address], password [facility] 03/17/2013, 12:16 PM  LOS: 6 days

## 2013-03-17 NOTE — Progress Notes (Signed)
Patient still actively bleeding from rectum, large amounts of bright red blood saturating the bed.  Notified MD on-call.  H&H and coags ordered.  Will continue to monitor.  [name] Patient

## 2013-03-17 NOTE — Progress Notes (Signed)
Subjective: Per RN: the patient had large amounts of bright red blood from his rectum early this morning around 1:30 am. Hgb was checked and he had a small drop from 11.0 to 10.6. RN notes that this shift, she has only noticed a small amount of blood on his bed pad. She examined his rectum and did not see any external hemorrhoids. The patient denies any rectal pain.   Objective: Vital signs in last 24 hours: Temp:  [97.4 F (36.3 C)-97.9 F (36.6 C)] 97.4 F (36.3 C) (02/20 0551) Pulse Rate:  [87-96] 87 (02/20 1105) Resp:  [18] 18 (02/20 0551) BP: (123-125)/(70-86) 125/70 mmHg (02/20 1105) SpO2:  [97 %-99 %] 99 % (02/20 1105) Last BM Date: 03/15/13  Intake/Output from previous day: 02/19 0701 - 02/20 0700 In: 600 [P.O.:600] Out: 1975 [Urine:1975] Intake/Output this shift: Total I/O In: 240 [P.O.:240] Out: -   Medications Current Facility-Administered Medications  Medication Dose Route Frequency Provider Last Rate Last Dose  . atorvastatin (LIPITOR) tablet 10 mg  10 mg Oral q1800 Oswald Hillock, MD   10 mg at 03/16/13 1815  . dabigatran (PRADAXA) capsule 150 mg  150 mg Oral BID Oswald Hillock, MD   150 mg at 03/16/13 2122  . diltiazem (CARDIZEM CD) 24 hr capsule 180 mg  180 mg Oral Daily Domenic Polite, MD   180 mg at 03/16/13 0941  . insulin aspart (novoLOG) injection 0-9 Units  0-9 Units Subcutaneous TID WC Oswald Hillock, MD   2 Units at 03/16/13 1816  . metoprolol (LOPRESSOR) tablet 50 mg  50 mg Oral BID Oswald Hillock, MD   50 mg at 03/16/13 2122  . moxifloxacin (VIGAMOX) 0.5 % ophthalmic solution 1 drop  1 drop Left Eye Q6H Harolyn Rutherford, RPH   1 drop at 03/17/13 0617  . ondansetron (ZOFRAN) tablet 4 mg  4 mg Oral Q6H PRN Oswald Hillock, MD      . predniSONE (DELTASONE) tablet 10 mg  10 mg Oral Q breakfast Velvet Bathe, MD   10 mg at 03/17/13 0758  . sodium chloride (MURO 128) 5 % ophthalmic solution 1 drop  1 drop Left Eye BID Harolyn Rutherford, RPH   1 drop at 03/16/13 2122      PE: General appearance: alert, cooperative and no distress Lungs: clear to auscultation bilaterally Heart: regular rate and rhythm Extremities: trace edema Pulses: 2+ and symmetric Skin: warm and dry Neurologic: Grossly normal Rectum: + for small amount of blood  Lab Results:   Recent Labs  03/16/13 0327 03/17/13 0220  WBC 8.7  --   HGB 11.0* 10.6*  HCT 32.5* 31.2*  PLT 149*  --    BMET  Recent Labs  03/16/13 0327  NA 140  K 4.3  CL 107  CO2 23  GLUCOSE 192*  BUN 37*  CREATININE 1.08  CALCIUM 9.3   PT/INR  Recent Labs  03/17/13 0220  LABPROT 22.4*  INR 2.04*    Assessment/Plan  Principal Problem:   NSTEMI (non-ST elevated myocardial infarction) Active Problems:   Hypertension   Atrial fibrillation   CAD (coronary artery disease)  Plan: Pt reportedly continued to have bleeding from his rectum overnight, "saturating the bed", however day nurse reports that his condition is somewhat improved. She has only noticed a small amount of blood on his bed pad this shift. I have examined the patient and confirm that he is bleeding from his rectum. Only a small  amount was visualized during my examination. The patient is alert and vital signs stable. GI has been consulted. Awaiting their evaluation. ? If internal hemorrhoids or anal fissure. Continue to hold Pradaxa until pt is seen by Dr. Irish Lack.     LOS: 6 days    Brittainy M. Rosita Fire, PA-C 03/17/2013 11:19 AM  GI bleeding.  Patient reports constipation and manual disimpaction recently.   Unclear if GI will want to do some type of eval with anoscopy or something more invasive.  Will hold Pradaxa today until GI w/u is determined.  Would like to restart anticoagulation as soon as possible to lower stroke risk.  Given his age, Pradaxa may not be the best choice.  Could consider Eliquis or Xarelto.   No CP or SHOB. COntinue conservative therapy for NSTEMI.

## 2013-03-18 ENCOUNTER — Encounter (HOSPITAL_COMMUNITY): Payer: Self-pay | Admitting: *Deleted

## 2013-03-18 ENCOUNTER — Encounter (HOSPITAL_COMMUNITY)
Admission: EM | Disposition: A | Discharge status: Home Health | Payer: Self-pay | Source: Home / Self Care | Attending: Family Medicine

## 2013-03-18 DIAGNOSIS — I10 Essential (primary) hypertension: Secondary | ICD-10-CM

## 2013-03-18 DIAGNOSIS — Z7901 Long term (current) use of anticoagulants: Secondary | ICD-10-CM

## 2013-03-18 HISTORY — PX: FLEXIBLE SIGMOIDOSCOPY: SHX5431

## 2013-03-18 LAB — GLUCOSE, CAPILLARY
GLUCOSE-CAPILLARY: 96 mg/dL (ref 70–99)
GLUCOSE-CAPILLARY: 95 mg/dL (ref 70–99)
Glucose-Capillary: 152 mg/dL — ABNORMAL HIGH (ref 70–99)
Glucose-Capillary: 141 mg/dL — ABNORMAL HIGH (ref 70–99)

## 2013-03-18 SURGERY — SIGMOIDOSCOPY, FLEXIBLE
Anesthesia: Moderate Sedation

## 2013-03-18 NOTE — Progress Notes (Signed)
[facility] PROGRESS NOTE  [name] ZOX:[id number] DOB: Aug 09, 1931 DOA: 03/11/2013 PCP: Leonides Grills, MD  Assessment/Plan: Non-STEMI  - Cath 3/14 with nonobstructive CAD  - Cardiology on board. - Continue Lopressor and Zocor  - D/c'd Pradaxa for GI evaluation of rectal bleeding. - ECHO with preserved EF and wall motion  - Continue med mgt   Rectal bleeding - Bright red blood noted in BM (2/20), pt is s/p flexible sigmoidoscopy. - Hgb 11.1 and Hct 33.4 on 2/20, reassess next am. - INR 2.04 on 2/20 - GI on board and recommending the following: Continue to hold any anticoagulation and see if  bleeding stops. If it does not then he will need to be cleaned out  and we will have to then proceed with repeat sigmoidoscopy with  attempts at argon plasma coagulation of bleeding sites in the  rectum  - Hemodynamically stable, continue to monitor - once anticoagulant able to be used will consider Eliquis (per cardiology) for anticoagulation after eval by GI  Fever/metabolic encephalopathy  - Likely due to fever and gouty flare  - Seen by Ortho suspect Gouty flare and didn't think he needed arthrocentesis  - Continue prednisone at home dose. He was given a higher dose for 2 days  - No other clear source of infection at this time, UA/CXR unremarkable  - Resolved. No fevers. Mentation at baseline.   R knee and ankle gouty flare  - See above   Mastoid sinusitis  - Fever on admission could be related to this too  - Resolved with 5 day course of Levaquin (completed)  - Suspected cause of alteration in mental status  Atrial fibrillation with bradycardia now RVR  - Rate in 40s overnight 2/15 hence diltiazem stopped 2/16 am per cards recomendations  - But through the day 2/16 HR elevated in 90s-110.  - Called and d/w Dr.Jordan 2/17, Restarted Diltiazem. Currently HR in the 80's - Continue Lopressor and d/c'd pradaxa, see above.  Hypertension  - Stable, continue Lopressor  and Cardizem   Diabetes mellitus  - Continue sliding scale insulin.  - Hold Glucotrol XL while in house  LEukocytosis  - Suspect related to steroids, now resolved - Afebrile, monitor, Blood Cx negative   DVT proph: SCDs, Pradaxa d/c'd until evaluated by GI  Code Status: Full Family Communication: No family at bedside. Discussed with patient Disposition Plan: Pending evaluation and recommendations by GI   Consultants:  Orthopedics   Cardiology  GI  Procedures:  None  Antibiotics:  Levaquin 5/5 days completed on 2/18  HPI/Subjective: No new complaints. No new reports of bleeding.  Objective: Filed Vitals:   03/18/13 1412  BP: 118/76  Pulse: 97  Temp: 97.3 F (36.3 C)  Resp: 18    Intake/Output Summary (Last 24 hours) at 03/18/13 1601 Last data filed at 03/18/13 1200  Gross per 24 hour  Intake    440 ml  Output   1950 ml  Net  -1510 ml   Filed Weights   03/12/13 0026  Weight: 101.8 kg (224 lb 6.9 oz)    Exam:   General: Patient in no acute distress, alert  Cardiovascular: Irregularly irregular, no murmurs  Respiratory: Clear to auscultation bilaterally, no wheezes  Abdomen: Soft, nondistended, non-tender. Bleeding from rectum, small amount of blood visualized.  Musculoskeletal: No cyanosis or clubbing  Data Reviewed: Basic Metabolic Panel:  Recent Labs Lab 03/12/13 0928 03/13/13 0430 03/14/13 0400 03/16/13 0327 03/17/13 1120  NA 144 137 141 140 142  K 3.7 4.4 4.5 4.3 4.2  CL 109 103 104 107 108  CO2 21 17* 22 23 23   GLUCOSE 85 134* 170* 192* 138*  BUN 18 25* 36* 37* 32*  CREATININE 1.09 1.21 1.23 1.08 1.02  CALCIUM 9.9 9.7 9.9 9.3 9.2   Liver Function Tests:  Recent Labs Lab 03/11/13 1828 03/12/13 0928  AST 39* 43*  ALT 12 11  ALKPHOS 73 61  BILITOT 0.5 0.5  PROT 6.4 5.5*  ALBUMIN 3.0* 2.7*   No results found for this basename: LIPASE, AMYLASE,  in the last 168 hours No results found for this basename: AMMONIA,  in  the last 168 hours CBC:  Recent Labs Lab 03/11/13 1828 03/12/13 0928 03/13/13 0430 03/14/13 0400 03/16/13 0327 03/17/13 0220 03/17/13 1120  WBC 10.9* 11.2* 11.3* 13.6* 8.7  --  10.9*  NEUTROABS 8.3*  --   --   --   --   --   --   HGB 12.8* 11.2* 11.2* 11.1* 11.0* 10.6* 11.1*  HCT 38.5* 33.7* 33.4* 32.2* 32.5* 31.2* 33.4*  MCV 101.6* 101.8* 100.6* 98.2 99.1  --  99.1  PLT 182 148* 150 158 149*  --  162   Cardiac Enzymes:  Recent Labs Lab 03/11/13 1828 03/12/13 0036 03/12/13 0928  TROPONINI 3.95* 4.89* 4.33*   BNP (last 3 results)  Recent Labs  05/04/12 2006  PROBNP 353.7   CBG:  Recent Labs Lab 03/17/13 1126 03/17/13 1543 03/17/13 2056 03/18/13 0623 03/18/13 1125  GLUCAP 125* 180* 139* 96 95    Recent Results (from the past 240 hour(s))  CULTURE, BLOOD (ROUTINE X 2)     Status: None   Collection Time    03/11/13  8:34 PM      Result Value Ref Range Status   Specimen Description BLOOD LEFT ANTECUBITAL   Final   Special Requests BOTTLES DRAWN AEROBIC AND ANAEROBIC 8CC   Final   Culture NO GROWTH 5 DAYS   Final   Report Status 03/16/2013 FINAL   Final  CULTURE, BLOOD (ROUTINE X 2)     Status: None   Collection Time    03/11/13  8:34 PM      Result Value Ref Range Status   Specimen Description LEFT ANTECUBITAL   Final   Special Requests BOTTLES DRAWN AEROBIC ONLY 8CC    Final   Culture NO GROWTH 5 DAYS   Final   Report Status 03/16/2013 FINAL   Final  MRSA PCR SCREENING     Status: None   Collection Time    03/12/13 12:31 AM      Result Value Ref Range Status   MRSA by PCR NEGATIVE  NEGATIVE Final   Comment:            The GeneXpert MRSA Assay (FDA     approved for NASAL specimens     only), is one component of a     comprehensive MRSA colonization     surveillance program. It is not     intended to diagnose MRSA     infection nor to guide or     monitor treatment for     MRSA infections.     Studies: No results found.  Scheduled Meds: .  atorvastatin  10 mg Oral q1800  . diltiazem  180 mg Oral Daily  . insulin aspart  0-9 Units Subcutaneous TID WC  . metoprolol  50 mg Oral BID  . moxifloxacin  1 drop Left Eye Q6H  .  predniSONE  10 mg Oral Q breakfast  . sodium chloride  1 drop Left Eye BID   Continuous Infusions:   Principal Problem:   NSTEMI (non-ST elevated myocardial infarction) Active Problems:   Hypertension   Atrial fibrillation   CAD (coronary artery disease)    Time spent: > 32 minutes    Velvet Bathe  [facility] Pager [phone]. If 7PM-7AM, please contact night-coverage at [email or web address], password [facility] 03/18/2013, 4:01 PM  LOS: 7 days

## 2013-03-18 NOTE — Op Note (Signed)
[facility] [place], [id number]   COLONOSCOPY PROCEDURE REPORT  PATIENT: [name], [name].  MR#: [id number] BIRTHDATE: 1931/07/29 , 81  yrs. old GENDER: Male ENDOSCOPIST: Acquanetta Sit, MD REFERRED BY: PROCEDURE DATE:  03/18/2013 PROCEDURE:   flexible sigmoidoscopy non-prepped for diagnostic purposes of rectal bleeding ASA CLASS:   3 INDICATIONS:rectal bleeding MEDICATIONS: no sedation  DESCRIPTION OF PROCEDURE:   After the risks benefits and alternatives of the procedure were thoroughly explained, informed consent was obtained.  digital rectal exam did not reveal any masses        The Pentax Ped Colon [id number]  endoscope was introduced through the anus and advanced to the sigmoid colon     . No adverse events experienced.   The quality of the prep was, this was a non-prepped flexible sigmoidoscopy      The instrument was then slowly withdrawn as the colon was fully examined.  The scope was advanced up to 40 cm. There was a moderate amount of stool present in the colon as suspected since this was an unprepped procedure. The mucosa of the sigmoid looked normal. 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 There was a lot of stool in the rectum. Some of the capillaries seemed friable and oozing. I could not see the entire rectum do to the presence of a large amount of stool.    The time to cecum=  .  Withdrawal time=  .  The scope was withdrawn and the procedure completed. COMPLICATIONS: There were no complications.  ENDOSCOPIC IMPRESSION: radiation proctitis with oozing of blood  RECOMMENDATIONS:continue to hold any anticoagulation and see if bleeding stops. If it does not then he will need to be cleaned out and we will have to then proceed with repeat sigmoidoscopy with attempts at argon plasma coagulation of bleeding sites in the rectum   eSigned:   Acquanetta Sit, MD 03/18/2013 1:43 PM   cc:

## 2013-03-18 NOTE — Progress Notes (Signed)
SUBJECTIVE:  Denies any chest pain or SOB. Nurse states he continues to have intermittent rectal bleeding  OBJECTIVE:   Vitals:   Filed Vitals:   03/17/13 1400 03/17/13 1935 03/17/13 2058 03/18/13 0635  BP: 110/69  119/62 133/83  Pulse: 70  82 88  Temp: 98.9 F (37.2 C)  98 F (36.7 C) 98 F (36.7 C)  TempSrc: Axillary  Oral Oral  Resp: 18 16 18 18   Height:      Weight:      SpO2: 98%  99% 100%   I&O's:   Intake/Output Summary (Last 24 hours) at 03/18/13 [id number] Last data filed at 03/18/13 0700  Gross per 24 hour  Intake    440 ml  Output   1975 ml  Net  -1535 ml   TELEMETRY: Reviewed telemetry pt in atrial fibrillation     PHYSICAL EXAM General: Well developed, well nourished, in no acute distress Head: Eyes PERRLA, No xanthomas.   Normal cephalic and atramatic  Lungs:   Clear bilaterally to auscultation and percussion. Heart:   HRRR S1 S2 Pulses are 2+ & equal. Abdomen: Bowel sounds are positive, abdomen soft and non-tender without masses  Extremities:   No clubbing, cyanosis or edema.  DP +1 Neuro: Alert and oriented X 3. Psych:  Good affect, responds appropriately   LABS: Basic Metabolic Panel:  Recent Labs  03/16/13 0327 03/17/13 1120  NA 140 142  K 4.3 4.2  CL 107 108  CO2 23 23  GLUCOSE 192* 138*  BUN 37* 32*  CREATININE 1.08 1.02  CALCIUM 9.3 9.2   Liver Function Tests: No results found for this basename: AST, ALT, ALKPHOS, BILITOT, PROT, ALBUMIN,  in the last 72 hours No results found for this basename: LIPASE, AMYLASE,  in the last 72 hours CBC:  Recent Labs  03/16/13 0327 03/17/13 0220 03/17/13 1120  WBC 8.7  --  10.9*  HGB 11.0* 10.6* 11.1*  HCT 32.5* 31.2* 33.4*  MCV 99.1  --  99.1  PLT 149*  --  162   Cardiac Enzymes: No results found for this basename: CKTOTAL, CKMB, CKMBINDEX, TROPONINI,  in the last 72 hours BNP: No components found with this basename: POCBNP,  D-Dimer: No results found for this basename: DDIMER,  in the  last 72 hours Hemoglobin A1C: No results found for this basename: HGBA1C,  in the last 72 hours Fasting Lipid Panel: No results found for this basename: CHOL, HDL, LDLCALC, TRIG, CHOLHDL, LDLDIRECT,  in the last 72 hours Thyroid Function Tests: No results found for this basename: TSH, T4TOTAL, FREET3, T3FREE, THYROIDAB,  in the last 72 hours Anemia Panel: No results found for this basename: VITAMINB12, FOLATE, FERRITIN, TIBC, IRON, RETICCTPCT,  in the last 72 hours Coag Panel:   Lab Results  Component Value Date   INR 2.04* 03/17/2013   INR 1.17 11/27/2012   INR 1.26 11/20/2012    RADIOLOGY: Dg Pelvis 1-2 Views  03/11/2013   CLINICAL DATA:  Fall  EXAM: PELVIS - 1-2 VIEW  COMPARISON:  None.  FINDINGS: There is no evidence of pelvic fracture or diastasis. No other pelvic bone lesions are seen. Suboptimal visualization due to body habitus and technique. Clips project over the pelvis. Left total hip arthroplasty is grossly unremarkable but incompletely visualized.  IMPRESSION: No displaced pelvic fracture allowing for technique as above.   Electronically Signed   By: Conchita Paris M.D.   On: 03/11/2013 19:22   Dg Elbow Complete Left  03/11/2013  CLINICAL DATA:  Fall  EXAM: LEFT ELBOW - COMPLETE 3+ VIEW  COMPARISON:  None.  FINDINGS: There is no evidence of fracture, dislocation, or joint effusion. There is no evidence of arthropathy or other focal bone abnormality. Soft tissues are unremarkable. The patient was unable to cooperate for optimal positioning.  IMPRESSION: Negative.   Electronically Signed   By: Conchita Paris M.D.   On: 03/11/2013 19:21   Dg Elbow Complete Right  03/11/2013   CLINICAL DATA:  Fall  EXAM: RIGHT ELBOW - COMPLETE 3+ VIEW  COMPARISON:  None.  FINDINGS: There is no evidence of fracture, dislocation, or joint effusion. There is no evidence of arthropathy or other focal bone abnormality. Soft tissues are unremarkable. The patient was unable to be properly positioned for  all views.  IMPRESSION: Negative.   Electronically Signed   By: Conchita Paris M.D.   On: 03/11/2013 19:22   Ct Head Wo Contrast  03/11/2013   CLINICAL DATA:  Fall, altered mental status and headache  EXAM: CT HEAD WITHOUT CONTRAST  TECHNIQUE: Contiguous axial images were obtained from the base of the skull through the vertex without intravenous contrast.  COMPARISON:  Prior CT scan of the head 11/27/2012  FINDINGS: Negative for acute intracranial hemorrhage, acute infarction, mass, mass effect, hydrocephalus or midline shift. Gray-white differentiation is preserved throughout. No acute soft tissue or calvarial abnormality. Stable cerebral and cerebellar volume loss with compensatory ex vacuo dilatation of the lateral ventricles. Mild periventricular white matter hypoattenuation consistent with longstanding microvascular ischemic white matter changes. Chronic partial right mastoid effusion. The left mastoid air cells are well aerated. Partial opacification of the left sphenoid sinus. This is a new finding compared to prior. Atherosclerotic calcification in the bilateral cavernous and supra clinoid carotid arteries.  IMPRESSION: 1. No acute intracranial abnormality. 2. Developing partial opacification of the left sphenoid sinus consistent with inflammatory paranasal sinus disease. 3. Chronic partial right mastoid opacification. Query chronic mastoiditis? 4. Stable cerebral and cerebellar atrophy, mild chronic ischemic white matter changes and intracranial atherosclerosis.   Electronically Signed   By: Jacqulynn Cadet M.D.   On: 03/11/2013 19:34   Dg Chest Port 1 View  03/11/2013   CLINICAL DATA:  Non ST elevation myocardial infarction  EXAM: PORTABLE CHEST - 1 VIEW  COMPARISON:  Prior chest x-ray 11/06/2012  FINDINGS: Stable cardiac and mediastinal contours with mild cardiomegaly. Atherosclerotic calcification noted in the transverse aorta. Negative for pulmonary edema. No pneumothorax, focal airspace  consolidation or pleural effusion. No acute osseous abnormality.  IMPRESSION: Stable cardiomegaly without evidence of failure.   Electronically Signed   By: Jacqulynn Cadet M.D.   On: 03/11/2013 19:56   Dg Knee Complete 4 Views Right  03/11/2013   CLINICAL DATA:  Fall  EXAM: RIGHT KNEE - COMPLETE 4+ VIEW  COMPARISON:  None.  FINDINGS: There is no evidence of fracture, dislocation, or joint effusion. There is no evidence of arthropathy or other focal bone abnormality. Soft tissues are unremarkable. Tricompartmental degenerative change is noted. Detail is obscured by patient inability to remain still for the examination. Vascular calcifications are noted. No suprapatellar effusion.  IMPRESSION: Negative.   Electronically Signed   By: Conchita Paris M.D.   On: 03/11/2013 19:21    Assessment/Plan  Principal Problem:  NSTEMI (non-ST elevated myocardial infarction) - cath 03/2012 with nonobstructive ASCAD - continue medical mangement.  Patient does not want repeat cath. Active Problems:  Hypertension  Atrial fibrillation - currently in afib rate controlled  CAD (  coronary artery disease)  Rectal Bleeding  Plan:  1.  Continue Lopressor/Cardizem for rate control 2.  Flex sig will be done today by GI and then hopefully can restart anticoagulation.  Given his age would recommend Eliquis.  Sueanne Margarita, MD  03/18/2013  8:24 AM

## 2013-03-19 LAB — BASIC METABOLIC PANEL
BUN: 22 mg/dL (ref 6–23)
CHLORIDE: 107 meq/L (ref 96–112)
CO2: 22 mEq/L (ref 19–32)
Calcium: 9.1 mg/dL (ref 8.4–10.5)
Creatinine, Ser: 0.9 mg/dL (ref 0.50–1.35)
GFR, EST NON AFRICAN AMERICAN: 79 mL/min — AB (ref >90)
Glucose, Bld: 134 mg/dL — ABNORMAL HIGH (ref 70–99)
POTASSIUM: 4.4 meq/L (ref 3.7–5.3)
Sodium: 139 mEq/L (ref 137–147)

## 2013-03-19 LAB — CBC
HCT: 33.1 % — ABNORMAL LOW (ref 39.0–52.0)
Hemoglobin: 11.1 g/dL — ABNORMAL LOW (ref 13.0–17.0)
MCH: 33.1 pg (ref 26.0–34.0)
MCHC: 33.5 g/dL (ref 30.0–36.0)
MCV: 98.8 fL (ref 78.0–100.0)
PLATELETS: 182 10*3/uL (ref 150–400)
RBC: 3.35 MIL/uL — ABNORMAL LOW (ref 4.22–5.81)
RDW: 14.6 % (ref 11.5–15.5)
WBC: 11 10*3/uL — AB (ref 4.0–10.5)

## 2013-03-19 LAB — GLUCOSE, CAPILLARY
GLUCOSE-CAPILLARY: 123 mg/dL — AB (ref 70–99)
Glucose-Capillary: 154 mg/dL — ABNORMAL HIGH (ref 70–99)
Glucose-Capillary: 175 mg/dL — ABNORMAL HIGH (ref 70–99)
Glucose-Capillary: 150 mg/dL — ABNORMAL HIGH (ref 70–99)

## 2013-03-19 NOTE — Progress Notes (Signed)
SUBJECTIVE:  No complaints  OBJECTIVE:   Vitals:   Filed Vitals:   03/18/13 1345 03/18/13 1412 03/18/13 2034 03/19/13 0614  BP: 114/69 118/76 108/71 110/75  Pulse: 94 97 82 86  Temp:  97.3 F (36.3 C) 98.3 F (36.8 C) 98.3 F (36.8 C)  TempSrc:  Oral Oral Oral  Resp: 14 18 20 18   Height:      Weight:      SpO2: 98% 95% 97% 97%   I&O's:   Intake/Output Summary (Last 24 hours) at 03/19/13 [id number] Last data filed at 03/19/13 [id number]  Gross per 24 hour  Intake      0 ml  Output   2550 ml  Net  -2550 ml   TELEMETRY: Reviewed telemetry pt in atrial fibrillation      PHYSICAL EXAM General: Well developed, well nourished, in no acute distress Head: Eyes PERRLA, No xanthomas.   Normal cephalic and atramatic  Lungs:   Clear bilaterally to auscultation and percussion. Heart:   Irregularly irregular S1 S2 Pulses are 2+ & equal. Abdomen: Bowel sounds are positive, abdomen soft and non-tender without masses Extremities:   No clubbing, cyanosis or edema.  DP +1 Neuro: Alert and oriented X 3. Psych:  Good affect, responds appropriately   LABS: Basic Metabolic Panel:  Recent Labs  03/17/13 1120 03/19/13 0637  NA 142 139  K 4.2 4.4  CL 108 107  CO2 23 22  GLUCOSE 138* 134*  BUN 32* 22  CREATININE 1.02 0.90  CALCIUM 9.2 9.1   Liver Function Tests: No results found for this basename: AST, ALT, ALKPHOS, BILITOT, PROT, ALBUMIN,  in the last 72 hours No results found for this basename: LIPASE, AMYLASE,  in the last 72 hours CBC:  Recent Labs  03/17/13 1120 03/19/13 0637  WBC 10.9* 11.0*  HGB 11.1* 11.1*  HCT 33.4* 33.1*  MCV 99.1 98.8  PLT 162 182   Cardiac Enzymes: No results found for this basename: CKTOTAL, CKMB, CKMBINDEX, TROPONINI,  in the last 72 hours BNP: No components found with this basename: POCBNP,  D-Dimer: No results found for this basename: DDIMER,  in the last 72 hours Hemoglobin A1C: No results found for this basename: HGBA1C,  in the last 72  hours Fasting Lipid Panel: No results found for this basename: CHOL, HDL, LDLCALC, TRIG, CHOLHDL, LDLDIRECT,  in the last 72 hours Thyroid Function Tests: No results found for this basename: TSH, T4TOTAL, FREET3, T3FREE, THYROIDAB,  in the last 72 hours Anemia Panel: No results found for this basename: VITAMINB12, FOLATE, FERRITIN, TIBC, IRON, RETICCTPCT,  in the last 72 hours Coag Panel:   Lab Results  Component Value Date   INR 2.04* 03/17/2013   INR 1.17 11/27/2012   INR 1.26 11/20/2012    RADIOLOGY: Dg Pelvis 1-2 Views  03/11/2013   CLINICAL DATA:  Fall  EXAM: PELVIS - 1-2 VIEW  COMPARISON:  None.  FINDINGS: There is no evidence of pelvic fracture or diastasis. No other pelvic bone lesions are seen. Suboptimal visualization due to body habitus and technique. Clips project over the pelvis. Left total hip arthroplasty is grossly unremarkable but incompletely visualized.  IMPRESSION: No displaced pelvic fracture allowing for technique as above.   Electronically Signed   By: Conchita Paris M.D.   On: 03/11/2013 19:22   Dg Elbow Complete Left  03/11/2013   CLINICAL DATA:  Fall  EXAM: LEFT ELBOW - COMPLETE 3+ VIEW  COMPARISON:  None.  FINDINGS: There is no evidence of  fracture, dislocation, or joint effusion. There is no evidence of arthropathy or other focal bone abnormality. Soft tissues are unremarkable. The patient was unable to cooperate for optimal positioning.  IMPRESSION: Negative.   Electronically Signed   By: Conchita Paris M.D.   On: 03/11/2013 19:21   Dg Elbow Complete Right  03/11/2013   CLINICAL DATA:  Fall  EXAM: RIGHT ELBOW - COMPLETE 3+ VIEW  COMPARISON:  None.  FINDINGS: There is no evidence of fracture, dislocation, or joint effusion. There is no evidence of arthropathy or other focal bone abnormality. Soft tissues are unremarkable. The patient was unable to be properly positioned for all views.  IMPRESSION: Negative.   Electronically Signed   By: Conchita Paris M.D.   On:  03/11/2013 19:22   Ct Head Wo Contrast  03/11/2013   CLINICAL DATA:  Fall, altered mental status and headache  EXAM: CT HEAD WITHOUT CONTRAST  TECHNIQUE: Contiguous axial images were obtained from the base of the skull through the vertex without intravenous contrast.  COMPARISON:  Prior CT scan of the head 11/27/2012  FINDINGS: Negative for acute intracranial hemorrhage, acute infarction, mass, mass effect, hydrocephalus or midline shift. Gray-white differentiation is preserved throughout. No acute soft tissue or calvarial abnormality. Stable cerebral and cerebellar volume loss with compensatory ex vacuo dilatation of the lateral ventricles. Mild periventricular white matter hypoattenuation consistent with longstanding microvascular ischemic white matter changes. Chronic partial right mastoid effusion. The left mastoid air cells are well aerated. Partial opacification of the left sphenoid sinus. This is a new finding compared to prior. Atherosclerotic calcification in the bilateral cavernous and supra clinoid carotid arteries.  IMPRESSION: 1. No acute intracranial abnormality. 2. Developing partial opacification of the left sphenoid sinus consistent with inflammatory paranasal sinus disease. 3. Chronic partial right mastoid opacification. Query chronic mastoiditis? 4. Stable cerebral and cerebellar atrophy, mild chronic ischemic white matter changes and intracranial atherosclerosis.   Electronically Signed   By: Jacqulynn Cadet M.D.   On: 03/11/2013 19:34   Dg Chest Port 1 View  03/11/2013   CLINICAL DATA:  Non ST elevation myocardial infarction  EXAM: PORTABLE CHEST - 1 VIEW  COMPARISON:  Prior chest x-ray 11/06/2012  FINDINGS: Stable cardiac and mediastinal contours with mild cardiomegaly. Atherosclerotic calcification noted in the transverse aorta. Negative for pulmonary edema. No pneumothorax, focal airspace consolidation or pleural effusion. No acute osseous abnormality.  IMPRESSION: Stable cardiomegaly  without evidence of failure.   Electronically Signed   By: Jacqulynn Cadet M.D.   On: 03/11/2013 19:56   Dg Knee Complete 4 Views Right  03/11/2013   CLINICAL DATA:  Fall  EXAM: RIGHT KNEE - COMPLETE 4+ VIEW  COMPARISON:  None.  FINDINGS: There is no evidence of fracture, dislocation, or joint effusion. There is no evidence of arthropathy or other focal bone abnormality. Soft tissues are unremarkable. Tricompartmental degenerative change is noted. Detail is obscured by patient inability to remain still for the examination. Vascular calcifications are noted. No suprapatellar effusion.  IMPRESSION: Negative.   Electronically Signed   By: Conchita Paris M.D.   On: 03/11/2013 19:21   Assessment/Plan  Principal Problem:  NSTEMI (non-ST elevated myocardial infarction) - cath 03/2012 with nonobstructive ASCAD - continue medical mangement. Patient does not want repeat cath.  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  Plan:  1. Continue Lopressor/Cardizem for rate control  2. Anticoagulation on hold for now per GI to see if bleeding stops 3. Would like to restart anticoagulation as soon as possible to lower stroke risk.  Would recommend Eliquis with lower bleeding profile     Sueanne Margarita, MD  03/19/2013  8:26 AM

## 2013-03-19 NOTE — Progress Notes (Signed)
[facility] Progress Note  Subjective: The patient states that his rectal bleeding has slowed down  Objective: Vital signs in last 24 hours: Temp:  [97.3 F (36.3 C)-98.3 F (36.8 C)] 98.3 F (36.8 C) (02/22 0614) Pulse Rate:  [59-97] 86 (02/22 0614) Resp:  [14-23] 18 (02/22 0614) BP: (108-149)/(69-96) 110/75 mmHg (02/22 0614) SpO2:  [72 %-100 %] 97 % (02/22 0614) Weight change:    PE: He is in no distress  Lab Results: Results for orders placed during the hospital encounter of 03/11/13 (from the past 24 hour(s))  GLUCOSE, CAPILLARY     Status: None   Collection Time    03/18/13 11:25 AM      Result Value Ref Range   Glucose-Capillary 95  70 - 99 mg/dL   Comment 1 Notify RN    GLUCOSE, CAPILLARY     Status: Abnormal   Collection Time    03/18/13  4:20 PM      Result Value Ref Range   Glucose-Capillary 152 (*) 70 - 99 mg/dL   Comment 1 Notify RN    GLUCOSE, CAPILLARY     Status: Abnormal   Collection Time    03/18/13  9:38 PM      Result Value Ref Range   Glucose-Capillary 141 (*) 70 - 99 mg/dL  GLUCOSE, CAPILLARY     Status: Abnormal   Collection Time    03/19/13  6:35 AM      Result Value Ref Range   Glucose-Capillary 154 (*) 70 - 99 mg/dL  CBC     Status: Abnormal   Collection Time    03/19/13  6:37 AM      Result Value Ref Range   WBC 11.0 (*) 4.0 - 10.5 K/uL   RBC 3.35 (*) 4.22 - 5.81 MIL/uL   Hemoglobin 11.1 (*) 13.0 - 17.0 g/dL   HCT 33.1 (*) 39.0 - 52.0 %   MCV 98.8  78.0 - 100.0 fL   MCH 33.1  26.0 - 34.0 pg   MCHC 33.5  30.0 - 36.0 g/dL   RDW 14.6  11.5 - 15.5 %   Platelets 182  150 - 400 K/uL  BASIC METABOLIC PANEL     Status: Abnormal   Collection Time    03/19/13  6:37 AM      Result Value Ref Range   Sodium 139  137 - 147 mEq/L   Potassium 4.4  3.7 - 5.3 mEq/L   Chloride 107  96 - 112 mEq/L   CO2 22  19 - 32 mEq/L   Glucose, Bld 134 (*) 70 - 99 mg/dL   BUN 22  6 - 23 mg/dL   Creatinine, Ser 0.90  0.50 - 1.35 mg/dL   Calcium  9.1  8.4 - 10.5 mg/dL   GFR calc non Af Amer 79 (*) >90 mL/min   GFR calc Af Amer >90  >90 mL/min    Studies/Results: @[id number] @    Assessment: Rectal bleeding. Flexible sigmoidoscopy showed dilated capillary is with bleeding in the rectum. He does have a history of having had radiation for prostate cancer. Of note is that he was also manually disimpacting himself about a week ago.  Plan: Continue to hold anticoagulation with hopes that bleeding ceases. If he continues to bleed despite holding anticoagulation he will need to be prepped well and a repeat flexible sigmoidoscopy can be done with consideration of argon plasma coagulation.    Wonda Horner 03/19/2013, 11:21 AM  Lab Results  Component Value  Date   HGB 11.1* 03/19/2013   HGB 11.1* 03/17/2013   HGB 10.6* 03/17/2013   HCT 33.1* 03/19/2013   HCT 33.4* 03/17/2013   HCT 31.2* 03/17/2013   HCT 43 11/20/2011   ALKPHOS 61 03/12/2013   ALKPHOS 73 03/11/2013   ALKPHOS 86 11/27/2012   ALKPHOS 67 11/20/2011   AST 43* 03/12/2013   AST 39* 03/11/2013   AST 21 11/27/2012   AST 25 11/20/2011   ALT 11 03/12/2013   ALT 12 03/11/2013   ALT 19 11/27/2012

## 2013-03-19 NOTE — Progress Notes (Signed)
[facility] PROGRESS NOTE  [name] SFS:[id number] DOB: Nov 24, 1931 DOA: 03/11/2013 PCP: Kirk Ruths, MD  Assessment/Plan: Non-STEMI  - Cath 3/14 with nonobstructive CAD  - Cardiology on board. - Continue Lopressor, Cardizem, and Zocor  - D/c'd Pradaxa for GI evaluation and rectal bleeding. - ECHO with preserved EF and wall motion  - Continue med mgt   Rectal bleeding - Bright red blood noted in BM (starting on 2/20), pt is s/p flexible sigmoidoscopy (2/21). - Hgb 11.1 and Hct 33.1 on 2/22, H/H stable. - INR 2.04 on 2/20 - GI on board and recommending the following: Continue to hold any anticoagulation and see if  bleeding stops. If it does not then he will need to be cleaned out  and we will have to then proceed with repeat sigmoidoscopy with  attempts at argon plasma coagulation of bleeding sites in the  rectum - Hemodynamically stable, continue to monitor - once able to anticoagulant will consider Eliquis (per cardiology)   Fever/metabolic encephalopathy  - Likely due to fever and gouty flare  - Seen by Ortho suspect Gouty flare and didn't think he needed arthrocentesis  - Continue prednisone at home dose. He was given a higher dose for 2 days  - No other clear source of infection at this time, UA/CXR unremarkable  - Resolved. No fevers. Mentation at baseline.   R knee and ankle gouty flare  - See above   Mastoid sinusitis  - Fever on admission could be related to this  - Resolved with 5 day course of Levaquin (completed 2/18)  - Suspected cause of alteration in mental status  Atrial fibrillation with bradycardia now RVR  - Rate in 40s overnight 2/15 hence diltiazem stopped 2/16 am per cards recomendations  - But through the day 2/16 HR elevated in 90s-110.  - Called and d/w Dr.Jordan 2/17, Restarted Diltiazem. Currently HR in the 80's - Continue Lopressor and d/c'd pradaxa, see above.  Hypertension  - Stable, continue Lopressor and Cardizem    Diabetes mellitus  - Continue sliding scale insulin.  - Hold Glucotrol XL while in house  LEukocytosis  - Suspect related to steroids - Afebrile, monitor, Blood Cx negative   DVT proph: SCDs, Pradaxa d/c'd on 2/20  Code Status: Full Family Communication: No family at bedside. Discussed with patient Disposition Plan: Pending resolution of rectal bleeding and recommendations by GI/cardiology   Consultants:  Orthopedics   Cardiology  GI  Procedures:  None  Antibiotics:  Levaquin 5/5 days completed on 2/18  HPI/Subjective: No new complaints. Pt reports rectal bleeding has decreased today.  Objective: Filed Vitals:   03/19/13 0614  BP: 110/75  Pulse: 86  Temp: 98.3 F (36.8 C)  Resp: 18    Intake/Output Summary (Last 24 hours) at 03/19/13 1132 Last data filed at 03/19/13 3435  Gross per 24 hour  Intake      0 ml  Output   2150 ml  Net  -2150 ml   Filed Weights   03/12/13 0026  Weight: 101.8 kg (224 lb 6.9 oz)    Exam:   General: Patient in no acute distress, alert  Cardiovascular: Irregularly irregular, no murmurs  Respiratory: Clear to auscultation bilaterally, no wheezes  Abdomen: Soft, nondistended, mild diffuse lower abdominal tenderness with deep palpation. S/p flex sigmoidoscopy on 2/21.   Musculoskeletal: No cyanosis or clubbing  Data Reviewed: Basic Metabolic Panel:  Recent Labs Lab 03/13/13 0430 03/14/13 0400 03/16/13 0327 03/17/13 1120 03/19/13 0637  NA 137 141 140  142 139  K 4.4 4.5 4.3 4.2 4.4  CL 103 104 107 108 107  CO2 17* 22 23 23 22   GLUCOSE 134* 170* 192* 138* 134*  BUN 25* 36* 37* 32* 22  CREATININE 1.21 1.23 1.08 1.02 0.90  CALCIUM 9.7 9.9 9.3 9.2 9.1   Liver Function Tests: No results found for this basename: AST, ALT, ALKPHOS, BILITOT, PROT, ALBUMIN,  in the last 168 hours No results found for this basename: LIPASE, AMYLASE,  in the last 168 hours No results found for this basename: AMMONIA,  in the last  168 hours CBC:  Recent Labs Lab 03/13/13 0430 03/14/13 0400 03/16/13 0327 03/17/13 0220 03/17/13 1120 03/19/13 0637  WBC 11.3* 13.6* 8.7  --  10.9* 11.0*  HGB 11.2* 11.1* 11.0* 10.6* 11.1* 11.1*  HCT 33.4* 32.2* 32.5* 31.2* 33.4* 33.1*  MCV 100.6* 98.2 99.1  --  99.1 98.8  PLT 150 158 149*  --  162 182   Cardiac Enzymes: No results found for this basename: CKTOTAL, CKMB, CKMBINDEX, TROPONINI,  in the last 168 hours BNP (last 3 results)  Recent Labs  05/04/12 2006  PROBNP 353.7   CBG:  Recent Labs Lab 03/18/13 0623 03/18/13 1125 03/18/13 1620 03/18/13 2138 03/19/13 0635  GLUCAP 96 95 152* 141* 154*    Recent Results (from the past 240 hour(s))  CULTURE, BLOOD (ROUTINE X 2)     Status: None   Collection Time    03/11/13  8:34 PM      Result Value Ref Range Status   Specimen Description BLOOD LEFT ANTECUBITAL   Final   Special Requests BOTTLES DRAWN AEROBIC AND ANAEROBIC 8CC   Final   Culture NO GROWTH 5 DAYS   Final   Report Status 03/16/2013 FINAL   Final  CULTURE, BLOOD (ROUTINE X 2)     Status: None   Collection Time    03/11/13  8:34 PM      Result Value Ref Range Status   Specimen Description LEFT ANTECUBITAL   Final   Special Requests BOTTLES DRAWN AEROBIC ONLY 8CC    Final   Culture NO GROWTH 5 DAYS   Final   Report Status 03/16/2013 FINAL   Final  MRSA PCR SCREENING     Status: None   Collection Time    03/12/13 12:31 AM      Result Value Ref Range Status   MRSA by PCR NEGATIVE  NEGATIVE Final   Comment:            The GeneXpert MRSA Assay (FDA     approved for NASAL specimens     only), is one component of a     comprehensive MRSA colonization     surveillance program. It is not     intended to diagnose MRSA     infection nor to guide or     monitor treatment for     MRSA infections.     Studies: No results found.  Scheduled Meds: . atorvastatin  10 mg Oral q1800  . diltiazem  180 mg Oral Daily  . insulin aspart  0-9 Units  Subcutaneous TID WC  . metoprolol  50 mg Oral BID  . moxifloxacin  1 drop Left Eye Q6H  . predniSONE  10 mg Oral Q breakfast  . sodium chloride  1 drop Left Eye BID   Continuous Infusions:   Principal Problem:   NSTEMI (non-ST elevated myocardial infarction) Active Problems:   Hypertension   Atrial fibrillation  CAD (coronary artery disease)    Time spent: > 33 minutes    Larwance Sachs  [facility] Pager [phone]. If 7PM-7AM, please contact night-coverage at [email or web address], password [facility] 03/19/2013, 11:32 AM  LOS: 8 days

## 2013-03-20 ENCOUNTER — Encounter (HOSPITAL_COMMUNITY): Payer: Self-pay | Admitting: Gastroenterology

## 2013-03-20 LAB — CBC
HEMATOCRIT: 36.6 % — AB (ref 39.0–52.0)
Hemoglobin: 12.2 g/dL — ABNORMAL LOW (ref 13.0–17.0)
MCH: 33.2 pg (ref 26.0–34.0)
MCHC: 33.3 g/dL (ref 30.0–36.0)
MCV: 99.7 fL (ref 78.0–100.0)
Platelets: 229 10*3/uL (ref 150–400)
RBC: 3.67 MIL/uL — AB (ref 4.22–5.81)
RDW: 14.4 % (ref 11.5–15.5)
WBC: 13.5 10*3/uL — ABNORMAL HIGH (ref 4.0–10.5)

## 2013-03-20 LAB — GLUCOSE, CAPILLARY
GLUCOSE-CAPILLARY: 141 mg/dL — AB (ref 70–99)
Glucose-Capillary: 97 mg/dL (ref 70–99)
Glucose-Capillary: 149 mg/dL — ABNORMAL HIGH (ref 70–99)
Glucose-Capillary: 155 mg/dL — ABNORMAL HIGH (ref 70–99)

## 2013-03-20 NOTE — Progress Notes (Signed)
[facility] PROGRESS NOTE  Elkin Belfield Chalk LXB:[id number] DOB: 04-29-1931 DOA: 03/11/2013 PCP: Leonides Grills, MD  Assessment/Plan: Non-STEMI  - Cath 3/14 with nonobstructive CAD  - Cardiology on board. D/c'd tele on 2/23 - Continue Lopressor, Cardizem, and Zocor  - D/c'd Pradaxa for GI evaluation and rectal bleeding on 2/20. - ECHO with preserved EF and wall motion  - Continue med mgt   Rectal bleeding - Bright red blood noted in BM (starting on 2/20), pt is s/p flexible sigmoidoscopy (2/21). - Hgb 11.1 and Hct 33.1 on 2/22, H/H stable. Check CBC today - INR 2.04 on 2/20 - GI on board and recommending the following: Continue to hold any anticoagulation and see if  bleeding stops. If it does not then he will need to be cleaned out  and we will have to then proceed with repeat sigmoidoscopy with  attempts at argon plasma coagulation of bleeding sites in the  rectum - Hemodynamically stable, continue to monitor - once able to anticoagulant will consider starting patient on Eliquis (per cardiology)  - Last BM was blood tinged. No overt bleeding in last 24 hours. Mgt per GI's recomendations  Fever/metabolic encephalopathy : Resolved - Likely due to fever and gouty flare  - Seen by Ortho suspect Gouty flare and didn't think he needed arthrocentesis  - Continue prednisone at home dose. He was given a higher dose for 2 days  - No other clear source of infection at this time, UA/CXR unremarkable  - Resolved. No fevers. Mentation at baseline.   R knee and ankle gouty flare  - See above   Mastoid sinusitis  - Fever on admission could be related to this  - Resolved with 5 day course of Levaquin (completed 2/18)  - Suspected cause of alteration in mental status  Atrial fibrillation with bradycardia now RVR  - Rate in 40s overnight 2/15 hence diltiazem stopped 2/16 am per cards recomendations  - But through the day 2/16 HR elevated in 90s-110.  - Called and d/w Dr.Jordan 2/17,  Restarted Diltiazem. HR currently controlled on PO diltiazem - Continue Lopressor and d/c'd pradaxa, see above. - Discontinue Telemetry per cards recs.  Hypertension  - Stable, continue Lopressor and Cardizem   Diabetes mellitus  - Continue sliding scale insulin.  - Hold Glucotrol XL while in house  LEukocytosis  - Suspect related to steroids - Afebrile, monitor, Blood Cx negative   DVT proph: SCDs, Pradaxa d/c'd on 2/20  Code Status: Full Family Communication: No family at bedside. Discussed with patient Disposition Plan: Pending resolution of rectal bleeding and recommendations by GI/cardiology   Consultants:  Orthopedics   Cardiology  GI  Procedures:  Sigmoidoscopy  Antibiotics:  Levaquin 5/5 days completed on 2/18  HPI/Subjective: No new complaints. Pt denies rectal bleeding today. Requesting advancement of diet  Objective: Filed Vitals:   03/20/13 0526  BP: 117/78  Pulse: 95  Temp: 98.4 F (36.9 C)  Resp: 16    Intake/Output Summary (Last 24 hours) at 03/20/13 1005 Last data filed at 03/20/13 0700  Gross per 24 hour  Intake   1570 ml  Output   3800 ml  Net  -2230 ml   Filed Weights   03/12/13 0026  Weight: 101.8 kg (224 lb 6.9 oz)    Exam:   General: Patient in no acute distress, alert  Cardiovascular: Irregularly irregular, no murmurs  Respiratory: Clear to auscultation bilaterally, no wheezes  Abdomen: Soft, nondistended, non-tender. No rectal bleeding or blood on chucks per  external visual exam  Musculoskeletal: No cyanosis or clubbing  Data Reviewed: Basic Metabolic Panel:  Recent Labs Lab 03/14/13 0400 03/16/13 0327 03/17/13 1120 03/19/13 0637  NA 141 140 142 139  K 4.5 4.3 4.2 4.4  CL 104 107 108 107  CO2 22 23 23 22   GLUCOSE 170* 192* 138* 134*  BUN 36* 37* 32* 22  CREATININE 1.23 1.08 1.02 0.90  CALCIUM 9.9 9.3 9.2 9.1   Liver Function Tests: No results found for this basename: AST, ALT, ALKPHOS, BILITOT,  PROT, ALBUMIN,  in the last 168 hours No results found for this basename: LIPASE, AMYLASE,  in the last 168 hours No results found for this basename: AMMONIA,  in the last 168 hours CBC:  Recent Labs Lab 03/14/13 0400 03/16/13 0327 03/17/13 0220 03/17/13 1120 03/19/13 0637  WBC 13.6* 8.7  --  10.9* 11.0*  HGB 11.1* 11.0* 10.6* 11.1* 11.1*  HCT 32.2* 32.5* 31.2* 33.4* 33.1*  MCV 98.2 99.1  --  99.1 98.8  PLT 158 149*  --  162 182   Cardiac Enzymes: No results found for this basename: CKTOTAL, CKMB, CKMBINDEX, TROPONINI,  in the last 168 hours BNP (last 3 results)  Recent Labs  05/04/12 2006  PROBNP 353.7   CBG:  Recent Labs Lab 03/19/13 0635 03/19/13 1129 03/19/13 1623 03/19/13 2211 03/20/13 0637  GLUCAP 154* 123* 175* 150* 97    Recent Results (from the past 240 hour(s))  CULTURE, BLOOD (ROUTINE X 2)     Status: None   Collection Time    03/11/13  8:34 PM      Result Value Ref Range Status   Specimen Description BLOOD LEFT ANTECUBITAL   Final   Special Requests BOTTLES DRAWN AEROBIC AND ANAEROBIC 8CC   Final   Culture NO GROWTH 5 DAYS   Final   Report Status 03/16/2013 FINAL   Final  CULTURE, BLOOD (ROUTINE X 2)     Status: None   Collection Time    03/11/13  8:34 PM      Result Value Ref Range Status   Specimen Description LEFT ANTECUBITAL   Final   Special Requests BOTTLES DRAWN AEROBIC ONLY 8CC    Final   Culture NO GROWTH 5 DAYS   Final   Report Status 03/16/2013 FINAL   Final  MRSA PCR SCREENING     Status: None   Collection Time    03/12/13 12:31 AM      Result Value Ref Range Status   MRSA by PCR NEGATIVE  NEGATIVE Final   Comment:            The GeneXpert MRSA Assay (FDA     approved for NASAL specimens     only), is one component of a     comprehensive MRSA colonization     surveillance program. It is not     intended to diagnose MRSA     infection nor to guide or     monitor treatment for     MRSA infections.     Studies: No results  found.  Scheduled Meds: . atorvastatin  10 mg Oral q1800  . diltiazem  180 mg Oral Daily  . insulin aspart  0-9 Units Subcutaneous TID WC  . metoprolol  50 mg Oral BID  . moxifloxacin  1 drop Left Eye Q6H  . predniSONE  10 mg Oral Q breakfast  . sodium chloride  1 drop Left Eye BID   Continuous Infusions:   Principal  Problem:   NSTEMI (non-ST elevated myocardial infarction) Active Problems:   Hypertension   Atrial fibrillation   CAD (coronary artery disease)    Time spent: > 35 minutes    Larwance Sachs  [facility] Pager [phone]. If 7PM-7AM, please contact night-coverage at [email or web address], password [facility] 03/20/2013, 10:05 AM  LOS: 9 days

## 2013-03-20 NOTE — Progress Notes (Signed)
Physical Therapy Treatment Patient Details Name: [name] MRN: [id number] DOB: 31-Mar-1931 Today's Date: 03/20/2013 Time: 1751-0258 PT Time Calculation (min): 23 min  PT Assessment / Plan / Recommendation  History of Present Illness Presented to the ED with chief complaint of altered mental status, as per family patient became confused and had an assisted fall to the floor in the bathroom. +NSTEMI. Rt knee pain ?gout vs arthritis PMHx CVA, HTN, gout, DVT   PT Comments   Patient making slow gains with mobility and gait.  Follow Up Recommendations  Home health PT;Supervision/Assistance - 24 hour     Does the patient have the potential to tolerate intense rehabilitation     Barriers to Discharge        Equipment Recommendations  None recommended by PT    Recommendations for Other Services    Frequency Min 3X/week   Progress towards PT Goals Progress towards PT goals: Progressing toward goals  Plan Current plan remains appropriate    Precautions / Restrictions Precautions Precautions: Fall Restrictions Weight Bearing Restrictions: No   Pertinent Vitals/Pain Pain in right knee impacting mobility    Mobility  Transfers Overall transfer level: Needs assistance Equipment used: Rolling walker (2 wheeled) Transfers: Sit to/from Stand Sit to Stand: Min assist General transfer comment: Patient able to don boots independently in chair.  Assist to rise to standing from low chair.  Decreased control of descent into chair when returning to sitting. Ambulation/Gait Ambulation/Gait assistance: Min assist Ambulation Distance (Feet): 84 Feet Assistive device: Rolling walker (2 wheeled) Gait Pattern/deviations: Step-through pattern;Decreased step length - right;Decreased step length - left;Trendelenburg;Trunk flexed Gait velocity: Slow gait speed Gait velocity interpretation: Below normal speed for age/gender General Gait Details: Verbal cues to keep RW close to body and stand  upright.  Noted trendelenberg gait pattern on left, with pelvis dropping on right with Lt stance.  Patient fatigues quickly with mobility.      PT Goals (current goals can now be found in the care plan section)    Visit Information  Last PT Received On: 03/20/13 Assistance Needed: +1 History of Present Illness: Presented to the ED with chief complaint of altered mental status, as per family patient became confused and had an assisted fall to the floor in the bathroom. +NSTEMI. Rt knee pain ?gout vs arthritis PMHx CVA, HTN, gout, DVT    Subjective Data  Subjective: "I do better with my boots"   Cognition  Cognition Arousal/Alertness: Awake/alert Behavior During Therapy: WFL for tasks assessed/performed Overall Cognitive Status: Impaired/Different from baseline Area of Impairment: Safety/judgement Safety/Judgement: Decreased awareness of deficits;Decreased awareness of safety    Balance     End of Session PT - End of Session Equipment Utilized During Treatment: Gait belt;Other (comment) (Boots - patient feels more safe with boots vs socks) Activity Tolerance: Patient limited by fatigue Patient left: in chair;with call bell/phone within reach;with chair alarm set Nurse Communication: Mobility status   GP     Despina Pole 03/20/2013, 2:47 PM Carita Pian. Sanjuana Kava, [place] Pager [phone]

## 2013-03-20 NOTE — Progress Notes (Signed)
   SUBJECTIVE:  No chest pain.  No SOB   PHYSICAL EXAM Filed Vitals:   03/19/13 1434 03/19/13 1956 03/19/13 1959 03/20/13 0526  BP: 104/68 148/67 128/76 117/78  Pulse: 87 67 77 95  Temp: 97.6 F (36.4 C) 98.2 F (36.8 C) 98.4 F (36.9 C) 98.4 F (36.9 C)  TempSrc: Oral Oral Oral Oral  Resp: 19 18 18 16   Height:      Weight:      SpO2: 100% 96% 97% 96%   General:  Weak.  No acute distress Lungs:  Clear Heart:  Irregular Abdomen:  Positive bowel sounds, no rebound no guarding Extremities:  No edema  LABS:  Results for orders placed during the hospital encounter of 03/11/13 (from the past 24 hour(s))  GLUCOSE, CAPILLARY     Status: Abnormal   Collection Time    03/19/13 11:29 AM      Result Value Ref Range   Glucose-Capillary 123 (*) 70 - 99 mg/dL   Comment 1 Notify RN    GLUCOSE, CAPILLARY     Status: Abnormal   Collection Time    03/19/13  4:23 PM      Result Value Ref Range   Glucose-Capillary 175 (*) 70 - 99 mg/dL   Comment 1 Notify RN    GLUCOSE, CAPILLARY     Status: Abnormal   Collection Time    03/19/13 10:11 PM      Result Value Ref Range   Glucose-Capillary 150 (*) 70 - 99 mg/dL  GLUCOSE, CAPILLARY     Status: None   Collection Time    03/20/13  6:37 AM      Result Value Ref Range   Glucose-Capillary 97  70 - 99 mg/dL    Intake/Output Summary (Last 24 hours) at 03/20/13 0827 Last data filed at 03/20/13 0700  Gross per 24 hour  Intake   1570 ml  Output   3800 ml  Net  -2230 ml     ASSESSMENT AND PLAN:  NSTEMI (non-ST elevated myocardial infarction):  Medical management.  Continue lopressor and calcium channel blocker.   Hypertension:  BP OK.  Continue current therapy.   Atrial fibrillation:  Rate controlled.  No systemic anticoagulation secondary to rectal bleeding.  Telemetry reviewed.  Rate controlled atrial fib.  Per guidelines, OK to discontinue telemetry.  Restart Eliquis when OK with GI.    [name] 03/20/2013 8:27 AM

## 2013-03-21 LAB — GLUCOSE, CAPILLARY
GLUCOSE-CAPILLARY: 80 mg/dL (ref 70–99)
Glucose-Capillary: 161 mg/dL — ABNORMAL HIGH (ref 70–99)
Glucose-Capillary: 151 mg/dL — ABNORMAL HIGH (ref 70–99)
Glucose-Capillary: 187 mg/dL — ABNORMAL HIGH (ref 70–99)

## 2013-03-21 MED ORDER — MESALAMINE 1000 MG RE SUPP
500.0000 mg | Freq: Every day | RECTAL | Status: DC
  Administered 2013-03-21: 500 mg via RECTAL
  Filled 2013-03-21 (×3): qty 1

## 2013-03-21 MED ORDER — POLYETHYLENE GLYCOL 3350 17 G PO PACK
17.0000 g | PACK | Freq: Two times a day (BID) | ORAL | Status: DC
  Administered 2013-03-21 – 2013-03-22 (×4): 17 g via ORAL
  Filled 2013-03-21 (×6): qty 1

## 2013-03-21 NOTE — Progress Notes (Signed)
[facility] PROGRESS NOTE  [name] [id number] DOB: March 12, 1931 DOA: 03/11/2013 PCP: Leonides Grills, MD Brief Narrative: The patient is a 80 year old Caucasian male with history of hypertension, gout, hyperlipidemia, deep vein thrombophlebitis of left leg on 11/2010, stroke on 08/2009, 07/2010, history of prostate cancer, renal cyst, paroxysmal atrial fibrillation. He presented to the hospital secondary to altered mental status. 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 He was getting ready to be discharged when he was found to have rectal bleeding as such gastroenterologist was consulted and patient had flexible sigmoidoscopy which showed radiation proctitis.  Assessment/Plan: Non-STEMI  - Cath 3/14 with nonobstructive CAD  - Cardiology on board. D/c'd tele on 2/23 - Continue Lopressor, Cardizem, and Zocor  - D/c'd Pradaxa for GI evaluation and rectal bleeding on 2/20. - ECHO with preserved EF and wall motion  - Continue med mgt   Rectal bleeding: 2ary to ratiation proctitis - Bright red blood noted in BM (starting on 2/20), pt is s/p flexible sigmoidoscopy (2/21). - Hgb 11.1 and Hct 33.1 on 2/22, H/H stable. Check CBC today - INR 2.04 on 2/20 - GI on board and recommending the following: 1. Radiation Proctitis. Bleeding appears to have resolved. Will begin miralax bid, canasa sup at bed and should be ok to advance diet. Follow Hg and if stable and no bleeding ok to resume anticoagulation.  - Will reassess hgb levels next am after diet has been advanced. If hgb steady and no reports of bleeding will plan on starting patient on Eliquis ( per cardiology recommendations)  Fever/metabolic encephalopathy : Resolved - Likely due to fever and gouty flare  - Seen by Ortho suspect Gouty flare and didn't think he needed arthrocentesis  - Continue prednisone at home dose. He was given a higher  dose for 2 days  - No other clear source of infection at this time, UA/CXR unremarkable  - Resolved. No fevers. Mentation at baseline.   R knee and ankle gouty flare  - See above   Mastoid sinusitis  - Fever on admission most likely related to this  - Resolved with 5 day course of Levaquin (completed 2/18)  - Suspected cause of alteration in mental status (which currently has resolved)  Atrial fibrillation with bradycardia now RVR  - Rate in 40s overnight 2/15 hence diltiazem stopped 2/16 am per cards recomendations  - But through the day 2/16 HR elevated in 90s-110.  - Called and d/w Dr.Jordan 2/17, Restarted Diltiazem. HR currently controlled on PO diltiazem - Continue Lopressor and d/c'd pradaxa, see above. - Discontinue Telemetry per cards recs.  Hypertension  - Stable, continue Lopressor and Cardizem   Diabetes mellitus  - Continue sliding scale insulin.  - Hold Glucotrol XL while in house  LEukocytosis  - Suspect related to steroids - Afebrile, monitor, Blood Cx negative   DVT proph: SCDs, Pradaxa d/c'd on 2/20  Code Status: Full Family Communication: No family at bedside. Discussed with patient Disposition Plan: Advancing diet today, next am placing on anticoagulation if hgb stable.   Consultants:  Orthopedics   Cardiology  GI  Procedures:  Sigmoidoscopy  Antibiotics:  Levaquin 5/5 days completed on 2/18  HPI/Subjective: No new complaints. Pt denies rectal bleeding today.   Objective: Filed Vitals:   03/21/13 1316  BP: 112/65  Pulse: 77  Temp: 97.9 F (36.6 C)  Resp: 18    Intake/Output Summary (Last 24 hours) at  03/21/13 1319 Last data filed at 03/21/13 1230  Gross per 24 hour  Intake   1560 ml  Output   3625 ml  Net  -2065 ml   Filed Weights   03/12/13 0026  Weight: 101.8 kg (224 lb 6.9 oz)    Exam:   General: Patient in no acute distress, alert  Cardiovascular: Irregularly irregular, no murmurs  Respiratory: Clear to  auscultation bilaterally, no wheezes  Abdomen: Soft, nondistended, non-tender. No rectal bleeding or blood on chucks per external visual exam  Musculoskeletal: No cyanosis or clubbing  Data Reviewed: Basic Metabolic Panel:  Recent Labs Lab 03/16/13 0327 03/17/13 1120 03/19/13 0637  NA 140 142 139  K 4.3 4.2 4.4  CL 107 108 107  CO2 23 23 22   GLUCOSE 192* 138* 134*  BUN 37* 32* 22  CREATININE 1.08 1.02 0.90  CALCIUM 9.3 9.2 9.1   Liver Function Tests: No results found for this basename: AST, ALT, ALKPHOS, BILITOT, PROT, ALBUMIN,  in the last 168 hours No results found for this basename: LIPASE, AMYLASE,  in the last 168 hours No results found for this basename: AMMONIA,  in the last 168 hours CBC:  Recent Labs Lab 03/16/13 0327 03/17/13 0220 03/17/13 1120 03/19/13 0637 03/20/13 1040  WBC 8.7  --  10.9* 11.0* 13.5*  HGB 11.0* 10.6* 11.1* 11.1* 12.2*  HCT 32.5* 31.2* 33.4* 33.1* 36.6*  MCV 99.1  --  99.1 98.8 99.7  PLT 149*  --  162 182 229   Cardiac Enzymes: No results found for this basename: CKTOTAL, CKMB, CKMBINDEX, TROPONINI,  in the last 168 hours BNP (last 3 results)  Recent Labs  05/04/12 2006  PROBNP 353.7   CBG:  Recent Labs Lab 03/20/13 1120 03/20/13 1620 03/20/13 2113 03/21/13 0634 03/21/13 1126  GLUCAP 149* 155* 141* 80 161*    Recent Results (from the past 240 hour(s))  CULTURE, BLOOD (ROUTINE X 2)     Status: None   Collection Time    03/11/13  8:34 PM      Result Value Ref Range Status   Specimen Description BLOOD LEFT ANTECUBITAL   Final   Special Requests BOTTLES DRAWN AEROBIC AND ANAEROBIC 8CC   Final   Culture NO GROWTH 5 DAYS   Final   Report Status 03/16/2013 FINAL   Final  CULTURE, BLOOD (ROUTINE X 2)     Status: None   Collection Time    03/11/13  8:34 PM      Result Value Ref Range Status   Specimen Description LEFT ANTECUBITAL   Final   Special Requests BOTTLES DRAWN AEROBIC ONLY 8CC    Final   Culture NO GROWTH 5  DAYS   Final   Report Status 03/16/2013 FINAL   Final  MRSA PCR SCREENING     Status: None   Collection Time    03/12/13 12:31 AM      Result Value Ref Range Status   MRSA by PCR NEGATIVE  NEGATIVE Final   Comment:            The GeneXpert MRSA Assay (FDA     approved for NASAL specimens     only), is one component of a     comprehensive MRSA colonization     surveillance program. It is not     intended to diagnose MRSA     infection nor to guide or     monitor treatment for     MRSA infections.  Studies: No results found.  Scheduled Meds: . atorvastatin  10 mg Oral q1800  . diltiazem  180 mg Oral Daily  . insulin aspart  0-9 Units Subcutaneous TID WC  . mesalamine  500 mg Rectal QHS  . metoprolol  50 mg Oral BID  . moxifloxacin  1 drop Left Eye Q6H  . polyethylene glycol  17 g Oral BID  . predniSONE  10 mg Oral Q breakfast  . sodium chloride  1 drop Left Eye BID   Continuous Infusions:   Principal Problem:   NSTEMI (non-ST elevated myocardial infarction) Active Problems:   Hypertension   Atrial fibrillation   CAD (coronary artery disease)    Time spent: > 35 minutes    Velvet Bathe  [facility] Pager [phone]. If 7PM-7AM, please contact night-coverage at [email or web address], password [facility] 03/21/2013, 1:19 PM  LOS: 10 days

## 2013-03-21 NOTE — Progress Notes (Signed)
Physical Therapy Treatment Patient Details Name: [name] MRN: [id number] DOB: 1931-10-04 Today's Date: 03/21/2013 Time: 1001-1017 PT Time Calculation (min): 16 min  PT Assessment / Plan / Recommendation  History of Present Illness Presented to the ED with chief complaint of altered mental status, as per family patient became confused and had an assisted fall to the floor in the bathroom. +NSTEMI. Rt knee pain ?gout vs arthritis PMHx CVA, HTN, gout, DVT   PT Comments   Pt progressing with gait from last session. Would recommend attempting stairs first next session prior to fatigue from gait. Pt very pleasant and continues to need cues for safety.   Follow Up Recommendations  Home health PT;Supervision/Assistance - 24 hour     Does the patient have the potential to tolerate intense rehabilitation     Barriers to Discharge        Equipment Recommendations       Recommendations for Other Services    Frequency     Progress towards PT Goals Progress towards PT goals: Progressing toward goals  Plan Current plan remains appropriate    Precautions / Restrictions Precautions Precautions: Fall   Pertinent Vitals/Pain No pain VSS    Mobility  Bed Mobility Overal bed mobility: Needs Assistance Bed Mobility: Rolling;Sidelying to Sit Rolling: Supervision Sidelying to sit: Min assist General bed mobility comments: cueing for sequence with use of rail and assist to elevate trunk from surface Transfers Overall transfer level: Needs assistance Equipment used: Rolling walker (2 wheeled) Transfers: Sit to/from Stand Sit to Stand: Min assist General transfer comment: cues for hand placement and assist for anterior translation. Assist to don boots EOB Ambulation/Gait Ambulation/Gait assistance: Min assist Ambulation Distance (Feet): 180 Feet Assistive device: Rolling walker (2 wheeled) Gait Pattern/deviations: Step-through pattern;Decreased stride length;Trunk  flexed;Trendelenburg Gait velocity: Slow gait speed General Gait Details: verbal and tactile cues to step into RW and keep body in middle tendency to push RW to right, cues for head up and direction as well. Fatigued and unable to attempt stairs today    Exercises General Exercises - Lower Extremity Short Arc Quad: AROM;Seated;Both;20 reps Hip ABduction/ADduction: AROM;Seated;Both;20 reps Hip Flexion/Marching: AROM;Both;20 reps;Seated   PT Diagnosis:    PT Problem List:   PT Treatment Interventions:     PT Goals (current goals can now be found in the care plan section)    Visit Information  Last PT Received On: 03/21/13 Assistance Needed: +1 History of Present Illness: Presented to the ED with chief complaint of altered mental status, as per family patient became confused and had an assisted fall to the floor in the bathroom. +NSTEMI. Rt knee pain ?gout vs arthritis PMHx CVA, HTN, gout, DVT    Subjective Data      Cognition  Cognition Arousal/Alertness: Awake/alert Behavior During Therapy: WFL for tasks assessed/performed Overall Cognitive Status: Impaired/Different from baseline Area of Impairment: Safety/judgement Memory: Decreased short-term memory Safety/Judgement: Decreased awareness of safety Problem Solving: Requires verbal cues    Balance     End of Session PT - End of Session Equipment Utilized During Treatment: Gait belt Activity Tolerance: Patient tolerated treatment well Patient left: in chair;with call bell/phone within reach;with chair alarm set Nurse Communication: Mobility status   GP     Melford Aase 03/21/2013, 10:27 AM Elwyn Reach, [place]

## 2013-03-21 NOTE — Progress Notes (Signed)
   SUBJECTIVE:  No chest pain.  No SOB.  Ambulated.    PHYSICAL EXAM Filed Vitals:   03/20/13 1056 03/20/13 1411 03/20/13 2111 03/21/13 0500  BP: 110/68 114/80 110/76 115/81  Pulse: 90 74 81 89  Temp:  97.6 F (36.4 C) 97.4 F (36.3 C) 97.6 F (36.4 C)  TempSrc:  Oral Oral Oral  Resp:  16 17 17   Height:      Weight:      SpO2:  97% 98% 97%   General:  Weak.  No acute distress Lungs:  Clear Heart:  Irregular Abdomen:  Positive bowel sounds, no rebound no guarding Extremities:  No edema  LABS:  Results for orders placed during the hospital encounter of 03/11/13 (from the past 24 hour(s))  GLUCOSE, CAPILLARY     Status: Abnormal   Collection Time    03/20/13  4:20 PM      Result Value Ref Range   Glucose-Capillary 155 (*) 70 - 99 mg/dL   Comment 1 Notify RN    GLUCOSE, CAPILLARY     Status: Abnormal   Collection Time    03/20/13  9:13 PM      Result Value Ref Range   Glucose-Capillary 141 (*) 70 - 99 mg/dL  GLUCOSE, CAPILLARY     Status: None   Collection Time    03/21/13  6:34 AM      Result Value Ref Range   Glucose-Capillary 80  70 - 99 mg/dL  GLUCOSE, CAPILLARY     Status: Abnormal   Collection Time    03/21/13 11:26 AM      Result Value Ref Range   Glucose-Capillary 161 (*) 70 - 99 mg/dL   Comment 1 Notify RN     Comment 2 Documented in Chart      Intake/Output Summary (Last 24 hours) at 03/21/13 1151 Last data filed at 03/21/13 0730  Gross per 24 hour  Intake   1200 ml  Output   2975 ml  Net  -1775 ml     ASSESSMENT AND PLAN:  NSTEMI (non-ST elevated myocardial infarction):  Medical management.  Continue lopressor and calcium channel blocker.   Hypertension:  BP OK.  Continue current therapy.   Atrial fibrillation:  Off of tele per guidelines.  No systemic anticoagulation secondary to rectal bleeding.   Restart Eliquis when OK with GI.     We will follow as needed.     Jeneen Rinks [facility] 03/21/2013 11:51 AM

## 2013-03-21 NOTE — Progress Notes (Signed)
[facility] PROGRESS NOTE Subjective Asked to see again about rectal bleeding. 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 Denies any BRB, some old blood.  Objective: Vital signs in last 24 hours: Temp:  [97.4 F (36.3 C)-97.6 F (36.4 C)] 97.6 F (36.4 C) (02/24 0500) Pulse Rate:  [74-89] 89 (02/24 0500) Resp:  [16-17] 17 (02/24 0500) BP: (110-115)/(76-81) 115/81 mmHg (02/24 0500) SpO2:  [97 %-98 %] 97 % (02/24 0500) Last BM Date: 03/20/13  Intake/Output from previous day: 02/23 0701 - 02/24 0700 In: 840 [P.O.:840] Out: 2975 [Urine:2975] Intake/Output this shift: Total I/O In: 360 [P.O.:360] Out: -   PE:  Abdomen--soft nontender  Lab Results:  Recent Labs  03/19/13 0637 03/20/13 1040  WBC 11.0* 13.5*  HGB 11.1* 12.2*  HCT 33.1* 36.6*  PLT 182 229   BMET  Recent Labs  03/19/13 0637  NA 139  K 4.4  CL 107  CO2 22  CREATININE 0.90   LFT No results found for this basename: PROT, AST, ALT, ALKPHOS, BILITOT, BILIDIR, IBILI,  in the last 72 hours PT/INR No results found for this basename: LABPROT, INR,  in the last 72 hours PANCREAS No results found for this basename: LIPASE,  in the last 72 hours       Studies/Results: No results found.  Medications: I have reviewed the patient's current medications.  Assessment/Plan: 1. Radiation Proctitis. Bleeding appears to have resolved. Will begin miralax bid, canasa sup at bed and should be ok to advance diet. Follow Hg and if stable and no bleeding ok to resume anticoagulation.   Selvin Yun JR,Jarryn Altland L 03/21/2013, 11:46 AM

## 2013-03-22 DIAGNOSIS — R55 Syncope and collapse: Secondary | ICD-10-CM

## 2013-03-22 LAB — GLUCOSE, CAPILLARY
GLUCOSE-CAPILLARY: 169 mg/dL — AB (ref 70–99)
Glucose-Capillary: 106 mg/dL — ABNORMAL HIGH (ref 70–99)
Glucose-Capillary: 174 mg/dL — ABNORMAL HIGH (ref 70–99)
Glucose-Capillary: 147 mg/dL — ABNORMAL HIGH (ref 70–99)

## 2013-03-22 LAB — CBC
HEMATOCRIT: 33.1 % — AB (ref 39.0–52.0)
HEMOGLOBIN: 11.1 g/dL — AB (ref 13.0–17.0)
MCH: 33.3 pg (ref 26.0–34.0)
MCHC: 33.5 g/dL (ref 30.0–36.0)
MCV: 99.4 fL (ref 78.0–100.0)
Platelets: 198 10*3/uL (ref 150–400)
RBC: 3.33 MIL/uL — AB (ref 4.22–5.81)
RDW: 14.7 % (ref 11.5–15.5)
WBC: 10.4 10*3/uL (ref 4.0–10.5)

## 2013-03-22 MED ORDER — APIXABAN 5 MG PO TABS
5.0000 mg | ORAL_TABLET | Freq: Two times a day (BID) | ORAL | Status: DC
  Administered 2013-03-22 – 2013-03-23 (×3): 5 mg via ORAL
  Filled 2013-03-22 (×4): qty 1

## 2013-03-22 NOTE — Progress Notes (Addendum)
[facility] PROGRESS NOTE  Haakon Titsworth Martinez PFX:[id number] DOB: 22-Feb-1931 DOA: 03/11/2013 PCP: Leonides Grills, MD Brief Narrative: The patient is a 80 year old Caucasian male with history of hypertension, gout, hyperlipidemia, deep vein thrombophlebitis of left leg on 11/2010, stroke on 08/2009, 07/2010, history of prostate cancer, renal cyst, paroxysmal atrial fibrillation. He presented to the hospital secondary to altered mental status. 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 He was getting ready to be discharged when he was found to have rectal bleeding as such gastroenterologist was consulted and patient had flexible sigmoidoscopy which showed radiation proctitis.  Assessment/Plan: Non-STEMI  - Cath 3/14 with nonobstructive CAD  - Cardiology on board. D/c'd tele on 2/23 - Continue Lopressor, Cardizem, and Zocor  - D/c'd Pradaxa for GI evaluation and rectal bleeding on 2/20. - ECHO with preserved EF and wall motion  - Continue med mgt   Rectal bleeding: 2ary to ratiation proctitis - Bright red blood noted in BM (starting on 2/20), pt is s/p flexible sigmoidoscopy (2/21). - hb stable, pradaxa stopped Radiation Proctitis. Bleeding appears to have resolved. Will begin miralax bid, canasa sup at bed and should be ok to advance diet. Follow Hg and if stable and no bleeding ok to resume anticoagulation. -restart anticoagulation with Eloquis today  Fever/metabolic encephalopathy : Resolved - Likely due to fever and gouty flare  - Seen by Ortho suspect Gouty flare and didn't think he needed arthrocentesis  - was given a course of prednisone for 3-4days - No other clear source of infection at this time, UA/CXR unremarkable  - Resolved. No fevers. Mentation at baseline.   R knee and ankle gouty flare  - See above   Mastoid sinusitis  - Fever on admission most likely related to this  - Resolved  with 5 day course of Levaquin (completed 2/18)  - Suspected cause of alteration in mental status (which currently has resolved)  Atrial fibrillation with bradycardia now RVR  - Rate in 40s overnight 2/15 hence diltiazem stopped 2/16 am per cards recomendations  - But through the day 2/16 HR elevated in 90s-110.  - Called and d/w Dr.Jordan 2/17, Restarted Diltiazem. HR currently controlled on PO diltiazem - Continue Lopressor and d/c'd pradaxa, see above. - restart anticoagulation with Eloquis   Hypertension  - Stable, continue Lopressor and Cardizem   Diabetes mellitus  - Continue sliding scale insulin.  - Hold Glucotrol XL while in house  LEukocytosis  - Suspect related to steroids - Afebrile, monitor, Blood Cx negative   DVT proph: SCDs, Pradaxa d/c'd on 2/20  Code Status: Full Family Communication: No family at bedside. Discussed with patient Disposition Plan: home tomorrow if no bleeding on anticoagulation  Consultants:  Orthopedics   Cardiology  GI  Procedures:  Sigmoidoscopy  Antibiotics:  Levaquin 5/5 days completed on 2/18  HPI/Subjective: No new complaints. Pt denies rectal bleeding today, frustrated abt the fact that he wont be discharged today  Objective: Filed Vitals:   03/22/13 0429  BP: 111/68  Pulse: 71  Temp: 98.2 F (36.8 C)  Resp: 18    Intake/Output Summary (Last 24 hours) at 03/22/13 0837 Last data filed at 03/22/13 0430  Gross per 24 hour  Intake    480 ml  Output   3350 ml  Net  -2870 ml   Filed Weights   03/12/13 0026  Weight: 101.8 kg (224 lb 6.9 oz)    Exam:  General: Patient in no acute distress, alert, awake, oriented x3  Cardiovascular: Irregularly irregular, no murmurs  Respiratory: Clear to auscultation bilaterally, no wheezes  Abdomen: Soft, nondistended, non-tender. No rectal bleeding or blood on chucks per external visual exam  Musculoskeletal: No cyanosis or clubbing  Data Reviewed: Basic Metabolic  Panel:  Recent Labs Lab 03/16/13 0327 03/17/13 1120 03/19/13 0637  NA 140 142 139  K 4.3 4.2 4.4  CL 107 108 107  CO2 23 23 22   GLUCOSE 192* 138* 134*  BUN 37* 32* 22  CREATININE 1.08 1.02 0.90  CALCIUM 9.3 9.2 9.1   Liver Function Tests: No results found for this basename: AST, ALT, ALKPHOS, BILITOT, PROT, ALBUMIN,  in the last 168 hours No results found for this basename: LIPASE, AMYLASE,  in the last 168 hours No results found for this basename: AMMONIA,  in the last 168 hours CBC:  Recent Labs Lab 03/16/13 0327 03/17/13 0220 03/17/13 1120 03/19/13 0637 03/20/13 1040 03/22/13 0255  WBC 8.7  --  10.9* 11.0* 13.5* 10.4  HGB 11.0* 10.6* 11.1* 11.1* 12.2* 11.1*  HCT 32.5* 31.2* 33.4* 33.1* 36.6* 33.1*  MCV 99.1  --  99.1 98.8 99.7 99.4  PLT 149*  --  162 182 229 198   Cardiac Enzymes: No results found for this basename: CKTOTAL, CKMB, CKMBINDEX, TROPONINI,  in the last 168 hours BNP (last 3 results)  Recent Labs  05/04/12 2006  PROBNP 353.7   CBG:  Recent Labs Lab 03/21/13 0634 03/21/13 1126 03/21/13 1622 03/21/13 2046 03/22/13 0605  GLUCAP 80 161* 151* 187* 106*    No results found for this or any previous visit (from the past 240 hour(s)).   Studies: No results found.  Scheduled Meds: . atorvastatin  10 mg Oral q1800  . diltiazem  180 mg Oral Daily  . insulin aspart  0-9 Units Subcutaneous TID WC  . mesalamine  500 mg Rectal QHS  . metoprolol  50 mg Oral BID  . moxifloxacin  1 drop Left Eye Q6H  . polyethylene glycol  17 g Oral BID  . predniSONE  10 mg Oral Q breakfast  . sodium chloride  1 drop Left Eye BID   Continuous Infusions:   Principal Problem:   NSTEMI (non-ST elevated myocardial infarction) Active Problems:   Hypertension   Atrial fibrillation   CAD (coronary artery disease)    Time spent: > 25 minutes    [facility] Pager [phone]. If 7PM-7AM, please contact night-coverage at [email or web address],  password [facility] 03/22/2013, 8:37 AM  LOS: 11 days

## 2013-03-22 NOTE — Progress Notes (Signed)
ANTICOAGULATION CONSULT NOTE - Initial Consult  Pharmacy Consult for apixaban Indication: atrial fibrillation  Allergies  Allergen Reactions  . Tetanus Toxoids Swelling    Also allergic to "high powered pain medications."    Patient Measurements: Height: 5\' 9"  (175.3 cm) Weight: 224 lb 6.9 oz (101.8 kg) IBW/kg (Calculated) : 70.7  Vital Signs: Temp: 98.2 F (36.8 C) (02/25 0429) Temp src: Oral (02/25 0429) BP: 111/68 mmHg (02/25 0429) Pulse Rate: 71 (02/25 0429)  Labs:  Recent Labs  03/20/13 1040 03/22/13 0255  HGB 12.2* 11.1*  HCT 36.6* 33.1*  PLT 229 198    Estimated Creatinine Clearance: 78.2 ml/min (by C-G formula based on Cr of 0.9).   Medical History: Past Medical History  Diagnosis Date  . Hypertension   . Gout     Acute episode involving ankle in 12/2011  . Hyperlipidemia   . Deep vein thrombophlebitis of left leg 11/2010    LLE; S/P THA  . Fasting hyperglycemia   . Stroke 08/2009; 07/2010    TIA in 07/2010  . H/O heat stroke 08/2009  . Prostate cancer 2013    adenocarcinoma; Gleason grade 8; PSA 9.45; EBRT 2013-14  . Renal cyst   . Syncope     01/2012: Negative pharmacologic stress nuclear  . PAF (paroxysmal atrial fibrillation)     a. first noted 01/2012 during admission for URI;  b. seen again 03/2012 after nstemi of unknown origin - pradaxa initiated.  Marland Kitchen CAD (coronary artery disease)     a. 01/2012 Elev Ti-->nonischemic MV;  b. 03/2012 NSTEMI/Cath: LM 50, LAD 50p/m, 68m/d, LCX nl, RI 60ost sup branch, RCA diff nonobs, EF 55%    Medications:  apixaban 5 mg po bid  Assessment: 80 year old male admitted for altered mental status, prior to admission, pt was on dabigatran for AFib.  Pt had recent BRBPR, GI evaluated pt and found dilated capillaries with rectal bleeding, pt also has h/o of radiation proctitis.  Bleeding now has ceased, hgb stable at 11.1, plts 198.  Goal of Therapy:  Monitor platelets by anticoagulation protocol: Yes   Plan:   Apixaban 5 mg po bid CBC q72h Will continue to monitor for s/sx bleeding  Hughes Better, PharmD, BCPS Clinical Pharmacist Pager: [phone] 03/22/2013 10:19 AM

## 2013-03-23 DIAGNOSIS — E2749 Other adrenocortical insufficiency: Secondary | ICD-10-CM

## 2013-03-23 LAB — CBC
HCT: 35.1 % — ABNORMAL LOW (ref 39.0–52.0)
Hemoglobin: 11.8 g/dL — ABNORMAL LOW (ref 13.0–17.0)
MCH: 33.2 pg (ref 26.0–34.0)
MCHC: 33.6 g/dL (ref 30.0–36.0)
MCV: 98.9 fL (ref 78.0–100.0)
PLATELETS: 201 10*3/uL (ref 150–400)
RBC: 3.55 MIL/uL — ABNORMAL LOW (ref 4.22–5.81)
RDW: 14.4 % (ref 11.5–15.5)
WBC: 9.9 10*3/uL (ref 4.0–10.5)

## 2013-03-23 LAB — GLUCOSE, CAPILLARY
Glucose-Capillary: 132 mg/dL — ABNORMAL HIGH (ref 70–99)
Glucose-Capillary: 211 mg/dL — ABNORMAL HIGH (ref 70–99)

## 2013-03-23 MED ORDER — POLYETHYLENE GLYCOL 3350 17 G PO PACK: 17.0000 g | PACK | Freq: Every day | ORAL | Status: DC

## 2013-03-23 MED ORDER — APIXABAN 5 MG PO TABS: 5.0000 mg | ORAL_TABLET | Freq: Two times a day (BID) | ORAL | Status: DC

## 2013-03-23 MED ORDER — MESALAMINE 1000 MG RE SUPP: 500.0000 mg | Freq: Every day | RECTAL | Status: DC

## 2013-03-23 NOTE — Progress Notes (Signed)
IV removed at this time; pt given d/c instructions; pt to d/c home with wife and 24hr caretaker; brother to pick pt up this afternoon; will await brother to arrive; pt sitting up in chair at this time; pt bathed and dressed; will cont. To monitor.

## 2013-04-01 ENCOUNTER — Encounter (HOSPITAL_COMMUNITY): Payer: Self-pay | Admitting: Emergency Medicine

## 2013-04-01 ENCOUNTER — Emergency Department (HOSPITAL_COMMUNITY)
Admission: EM | Admit: 2013-04-01 | Discharge: 2013-04-01 | Disposition: A | Discharge status: Home / Self Care | Payer: Medicare Other | Attending: Emergency Medicine | Admitting: Emergency Medicine

## 2013-04-01 ENCOUNTER — Emergency Department (HOSPITAL_COMMUNITY): Payer: Medicare Other

## 2013-04-01 DIAGNOSIS — Z792 Long term (current) use of antibiotics: Secondary | ICD-10-CM | POA: Insufficient documentation

## 2013-04-01 DIAGNOSIS — Z79899 Other long term (current) drug therapy: Secondary | ICD-10-CM | POA: Insufficient documentation

## 2013-04-01 DIAGNOSIS — Z7901 Long term (current) use of anticoagulants: Secondary | ICD-10-CM | POA: Insufficient documentation

## 2013-04-01 DIAGNOSIS — J189 Pneumonia, unspecified organism: Secondary | ICD-10-CM

## 2013-04-01 DIAGNOSIS — Z87891 Personal history of nicotine dependence: Secondary | ICD-10-CM | POA: Insufficient documentation

## 2013-04-01 DIAGNOSIS — I2510 Atherosclerotic heart disease of native coronary artery without angina pectoris: Secondary | ICD-10-CM | POA: Insufficient documentation

## 2013-04-01 DIAGNOSIS — K5900 Constipation, unspecified: Secondary | ICD-10-CM | POA: Insufficient documentation

## 2013-04-01 DIAGNOSIS — IMO0002 Reserved for concepts with insufficient information to code with codable children: Secondary | ICD-10-CM | POA: Insufficient documentation

## 2013-04-01 DIAGNOSIS — Z86718 Personal history of other venous thrombosis and embolism: Secondary | ICD-10-CM | POA: Insufficient documentation

## 2013-04-01 DIAGNOSIS — I10 Essential (primary) hypertension: Secondary | ICD-10-CM | POA: Insufficient documentation

## 2013-04-01 DIAGNOSIS — Z8673 Personal history of transient ischemic attack (TIA), and cerebral infarction without residual deficits: Secondary | ICD-10-CM | POA: Insufficient documentation

## 2013-04-01 DIAGNOSIS — I4891 Unspecified atrial fibrillation: Secondary | ICD-10-CM | POA: Insufficient documentation

## 2013-04-01 DIAGNOSIS — J159 Unspecified bacterial pneumonia: Secondary | ICD-10-CM | POA: Insufficient documentation

## 2013-04-01 DIAGNOSIS — I252 Old myocardial infarction: Secondary | ICD-10-CM | POA: Insufficient documentation

## 2013-04-01 DIAGNOSIS — Z8546 Personal history of malignant neoplasm of prostate: Secondary | ICD-10-CM | POA: Insufficient documentation

## 2013-04-01 DIAGNOSIS — Z87448 Personal history of other diseases of urinary system: Secondary | ICD-10-CM | POA: Insufficient documentation

## 2013-04-01 DIAGNOSIS — E785 Hyperlipidemia, unspecified: Secondary | ICD-10-CM | POA: Insufficient documentation

## 2013-04-01 DIAGNOSIS — M109 Gout, unspecified: Secondary | ICD-10-CM | POA: Insufficient documentation

## 2013-04-01 HISTORY — DX: Radiation proctitis: K62.7

## 2013-04-01 HISTORY — DX: Non-ST elevation (NSTEMI) myocardial infarction: I21.4

## 2013-04-01 LAB — COMPREHENSIVE METABOLIC PANEL
ALK PHOS: 75 U/L (ref 39–117)
ALT: 30 U/L (ref 0–53)
AST: 24 U/L (ref 0–37)
Albumin: 3 g/dL — ABNORMAL LOW (ref 3.5–5.2)
BUN: 25 mg/dL — AB (ref 6–23)
CHLORIDE: 101 meq/L (ref 96–112)
CO2: 23 meq/L (ref 19–32)
Calcium: 9.6 mg/dL (ref 8.4–10.5)
Creatinine, Ser: 1.04 mg/dL (ref 0.50–1.35)
GFR calc Af Amer: 77 mL/min — ABNORMAL LOW (ref >90)
GFR calc non Af Amer: 66 mL/min — ABNORMAL LOW (ref >90)
GLUCOSE: 223 mg/dL — AB (ref 70–99)
Potassium: 4.5 mEq/L (ref 3.7–5.3)
Sodium: 137 mEq/L (ref 137–147)
Total Bilirubin: 0.3 mg/dL (ref 0.3–1.2)
Total Protein: 6.4 g/dL (ref 6.0–8.3)

## 2013-04-01 LAB — URINALYSIS, ROUTINE W REFLEX MICROSCOPIC
Bilirubin Urine: NEGATIVE
GLUCOSE, UA: NEGATIVE mg/dL
Hgb urine dipstick: NEGATIVE
KETONES UR: NEGATIVE mg/dL
Leukocytes, UA: NEGATIVE
Nitrite: NEGATIVE
PH: 6 (ref 5.0–8.0)
Protein, ur: NEGATIVE mg/dL
Specific Gravity, Urine: 1.02 (ref 1.005–1.030)
Urobilinogen, UA: 0.2 mg/dL (ref 0.0–1.0)

## 2013-04-01 LAB — CBC WITH DIFFERENTIAL/PLATELET
Basophils Absolute: 0 10*3/uL (ref 0.0–0.1)
Basophils Relative: 0 % (ref 0–1)
Eosinophils Absolute: 0 10*3/uL (ref 0.0–0.7)
Eosinophils Relative: 0 % (ref 0–5)
HEMATOCRIT: 35.1 % — AB (ref 39.0–52.0)
HEMOGLOBIN: 11.7 g/dL — AB (ref 13.0–17.0)
Lymphocytes Relative: 3 % — ABNORMAL LOW (ref 12–46)
Lymphs Abs: 0.2 10*3/uL — ABNORMAL LOW (ref 0.7–4.0)
MCH: 33.5 pg (ref 26.0–34.0)
MCHC: 33.3 g/dL (ref 30.0–36.0)
MCV: 100.6 fL — ABNORMAL HIGH (ref 78.0–100.0)
MONO ABS: 0.5 10*3/uL (ref 0.1–1.0)
MONOS PCT: 6 % (ref 3–12)
NEUTROS ABS: 8.5 10*3/uL — AB (ref 1.7–7.7)
Neutrophils Relative %: 92 % — ABNORMAL HIGH (ref 43–77)
Platelets: 156 10*3/uL (ref 150–400)
RBC: 3.49 MIL/uL — ABNORMAL LOW (ref 4.22–5.81)
RDW: 14.5 % (ref 11.5–15.5)
WBC: 9.3 10*3/uL (ref 4.0–10.5)

## 2013-04-01 LAB — LIPASE, BLOOD: Lipase: 13 U/L (ref 11–59)

## 2013-04-01 LAB — LACTIC ACID, PLASMA: Lactic Acid, Venous: 2.8 mmol/L — ABNORMAL HIGH (ref 0.5–2.2)

## 2013-04-01 MED ORDER — IOHEXOL 300 MG/ML  SOLN
100.0000 mL | Freq: Once | INTRAMUSCULAR | Status: AC | PRN
  Administered 2013-04-01: 100 mL via INTRAVENOUS

## 2013-04-01 MED ORDER — SODIUM CHLORIDE 0.9 % IV SOLN
INTRAVENOUS | Status: DC
  Administered 2013-04-01: 16:00:00 via INTRAVENOUS

## 2013-04-01 MED ORDER — IOHEXOL 300 MG/ML  SOLN
50.0000 mL | Freq: Once | INTRAMUSCULAR | Status: AC | PRN
  Administered 2013-04-01: 50 mL via ORAL

## 2013-04-01 MED ORDER — SODIUM CHLORIDE 0.9 % IV BOLUS (SEPSIS)
500.0000 mL | Freq: Once | INTRAVENOUS | Status: AC
  Administered 2013-04-01: 500 mL via INTRAVENOUS

## 2013-04-01 MED ORDER — LEVOFLOXACIN 750 MG PO TABS: 750.0000 mg | ORAL_TABLET | Freq: Every day | ORAL | Status: DC

## 2013-04-01 NOTE — Discharge Instructions (Signed)
°Emergency Department Resource Guide °1) Find a Doctor and Pay Out of Pocket °Although you won't have to find out who is covered by your insurance plan, it is a good idea to ask around and get recommendations. You will then need to call the office and see if the doctor you have chosen will accept you as a new patient and what types of options they offer for patients who are self-pay. Some doctors offer discounts or will set up payment plans for their patients who do not have insurance, but you will need to ask so you aren't surprised when you get to your appointment. ° °2) Contact Your Local Health Department °Not all health departments have doctors that can see patients for sick visits, but many do, so it is worth a call to see if yours does. If you don't know where your local health department is, you can check in your phone book. The CDC also has a tool to help you locate your state's health department, and many state websites also have listings of all of their local health departments. ° °3) Find a Walk-in Clinic °If your illness is not likely to be very severe or complicated, you may want to try a walk in clinic. These are popping up all over the country in pharmacies, drugstores, and shopping centers. They're usually staffed by nurse practitioners or physician assistants that have been trained to treat common illnesses and complaints. They're usually fairly quick and inexpensive. However, if you have serious medical issues or chronic medical problems, these are probably not your best option. ° °No Primary Care Doctor: °- Call Health Connect at  832-8000 - they can help you locate a primary care doctor that  accepts your insurance, provides certain services, etc. °- Physician Referral Service- 1-800-533-3463 ° °Chronic Pain Problems: °Organization         Address  Phone   Notes  °[facility]  ([phone]) [phone] Patients need to be referred by their primary care doctor.  ° °Medication  Assistance: °Organization         Address  Phone   Notes  °[place] [facility] [address] °[place], [place] [address] (336) 641-8030 --Must be a resident of [place] °-- Must have NO insurance coverage whatsoever (no Medicaid/ Medicare, etc.) °-- The pt. MUST have a primary care doctor that directs their care regularly and follows them in the community °  °[facility]  (866) 331-1348   °[employer]  (888) 892-1162   ° °Agencies that provide inexpensive medical care: °Organization         Address  Phone   Notes  °[facility]  (336) 832-8035   °[facility]    (336) 832-7272   °[facility] [address] °[place], [place] [address] (336) 832-4777   °[facility] [address], °[place] (336) 271-4999   °[facility]    (336) 373-0678   °[facility]    (336) 272-1050   °[facility] ° [address], [place] Phone:  (336) 832-4444, Fax:  (336) 832-4440 Hours of Operation:  9 am - 6 pm, M-F.  Also accepts Medicaid/Medicare and self-pay.  °[facility] ° [address], [place] Phone: (336) [phone], Fax: (336) [phone]. Hours of Operation:  8:30 am - 5:30 pm, M-F.  Also accepts Medicaid and self-pay.  °[facility] [place] [address], [place] Phone: (336) 878-6027   °[facility] [address], [place], [place] (336)723-1848, Ext. 123 Mondays & Thursdays: 7-9 AM.  First 15 patients are seen on a first come, first serve basis. °  ° °Medicaid-accepting [place] Providers: ° °Organization         Address  Phone   Notes  °[facility] [address], [place] (336) 641-2100 Also accepts self-pay patients.  °[facility] [address], [place] ° (336) [phone]   °[facility] [address], [place]  (336) 288-8857   °[facility] [address] [place] [address], [place] (336) 299-7000   °Veita Bland [address], [place]  ° (336) 373-1557 Only accepts [place] Access Medicaid patients after they have their name applied to their card.  ° °Self-Pay (no insurance) in [place]: ° °Organization         Address  Phone   Notes  °Sickle Cell Patients, [facility] [address], [place] (336) 832-1970   °[facility] [address], [place] (336) 832-4400   °[facility] ° [address], [place] (336) 992-4800   °[facility]/Dr. Osei-Bonsu ° [address] [place] [address], [place] or [address], [place] (336) [phone] Phone number for both [place] and [place] locations is the same.  °[facility] [address], [place] (336) [phone]   °[facility] [address] [place] [address], [place] or [address] (336) 852-7530 °(336) 878-2260   °[facility] [address], [place] (336) 350-1642, phone; (336) 294-5005, fax Sees patients 1st and 3rd Saturday of every month.  Must not qualify for public or private insurance (i.e. Medicaid, Medicare, [place] Health Choice, Veterans' Benefits) • Household income should be no more than 200% of the poverty level •The clinic cannot treat you if you are pregnant or think you are pregnant • Sexually transmitted diseases are not treated at the clinic.  ° ° °Dental Care: °Organization         Address  Phone  Notes  °[place] [facility] [address], [place] (336) 641-6152 Accepts children up to age 21 who are enrolled in Medicaid or [place] Health Choice; pregnant women with a Medicaid card; and children who have applied for Medicaid or [place] Health Choice, but were declined, whose parents can pay a reduced fee at time of service.  °[place]  [facility] [place]  [address], [place] (336) 641-7733 Accepts children up to age 21 who are enrolled in Medicaid or [place] Health Choice; pregnant women with a Medicaid card; and children who have applied for Medicaid or  Health Choice, but were declined, whose parents can pay a reduced fee at time of service.  °[facility] ° [address],  (336) 641-4533 Patients are seen by appointment only. Walk-ins are not accepted. [facility] will see patients 18 years of age and older. °Monday - Tuesday (8am-5pm) °Most Wednesdays (8:30-5pm) °$30 per visit, cash only  °[facility] ° [address], [place] (336) [phone] Patients are seen by appointment only. Walk-ins are not accepted. [facility] will see patients 18 years of age and older. °One   Wednesday Evening (Monthly: Volunteer Based).  $30 per visit, cash only  °[facility]  (919) [phone] for adults; Children under age 4, call Graduate Pediatric Dentistry at ([phone]) [phone]. Children aged 4-14, please call ([phone]) [phone] to request a pediatric application. ° Dental services are provided in all areas of dental care including fillings, crowns and bridges, complete and partial dentures, implants, gum treatment, root canals, and extractions. Preventive care is also provided. Treatment is provided to both adults and children. °Patients are selected via a lottery and there is often a waiting list. °  °[facility] [address], °[place] ° (336) 763-8833 [email or web address] °  °[facility] [address], [place], [place] (336)723-1848, Ext. 123 Second and Fourth Thursday of each month, opens at 6:30 AM; Clinic ends at 9 AM.  Patients are seen on a first-come first-served basis, and a limited number are seen during each clinic.  ° °[facility] ° [address], [place], [place] (336) [phone]    Eligibility Requirements °You must have lived in [place], [place], or [place] counties for at least the last three months. °  You cannot be eligible for state or federal sponsored healthcare insurance, including Veterans Administration, Medicaid, or Medicare. °  You generally cannot be eligible for healthcare insurance through your employer.  °  How to apply: °Eligibility screenings are held every Tuesday and Wednesday afternoon from 1:00 pm until 4:00 pm. You do not need an appointment for the interview!  °[facility] [address], [place], [place] 336-631-2330   °[facility]  336-342-8273   °[facility]  336-703-3100   °[facility]  336-570-6415   ° °Behavioral Health Resources in the Community: °Intensive Outpatient Programs °Organization         Address  Phone  Notes  °[facility] [address], [place], [place] 336-878-6098   °[facility] [address], [place], [place] 336-832-9800   °[facility] [address], [place], [place] ° 336-882-2125   °[facility] [address],  °[place], [place] 1-800-853-5163 or 336-641-4981   °Substance Abuse Resources °Organization         Address  Phone  Notes  °[facility]  336-882-2125   °[facility]  336-784-9470   °[facility]  336-285-9073   °[facility]  336-845-3988   °Residential & Outpatient Substance Abuse Program  1-800-659-3381   °Psychological Services °Organization         Address  Phone  Notes  ° Health  336- 832-9600   °[facility]  336- 378-7881   °[facility] [address], [place] 1-800-853-5163 or 336-641-4981   ° °Mobile Crisis Teams °Organization         Address  Phone  Notes  °[facility], Mobile Crisis Care Unit  1-877-626-1772   °Assertive °[facility] ° [address]  [place], [place] 336-834-9664   °Sharon DeEsch [address] °[place] 336-554-5454   ° °Self-Help/Support Groups °Organization         Address  Phone             Notes  °[facility] [place] - variety of support groups  336- 373-1402 Call for more information  °Narcotics Anonymous (NA), [facility] [address], °[place]  2 meetings at this location  ° °  Residential Treatment Programs Organization         Address  Phone  Notes  [facility] [address]    [place]  1-(414) 100-3977   [facility]  [address] [place] [address], [place], [place]   [place], [place] [phone] Admissions: 8am-3pm M-F  [facility] [place] [address],    [place], [place] [phone]   [facility] [address] [place], [place], [place]   [facility] [address],  [place], [place]   [facility] Intensive Outpatient [address], [place], [place]   [facility]  [place], [place] 1-(508) 754-8560 or 947-885-9276   [facility] [address], [place], [place] Accepts Medicaid  [facility] [address],  [place] 1-318 545 2581 Substance Abuse/Addiction Treatment   [facility]         Address  Phone  Notes  [facility]  865-532-1750   Domenic Schwab, PhD [address] [place], [place]   814-505-0583 or 385-876-6726   [facility] [place], [place] 517-088-4904   [facility] [address] [place], [place] (725)288-6335 Insurance/Medicaid/sponsorship through [facility] [address]                                    [place], [place] [phone] [place] [address], [place] ([phone]    Dr. Adele Schilder  [phone]   [facility] Dept. 1) [address] [place] 2) [place] 3)  [address], [place] ([phone]) [phone]  ([phone]   [place] [phone] or 616-236-9467 (After Hours)       Increase the fiber in your diet. Start to take over the counter stool softener (colace or miralax), as directed on packaging, for the next several weeks.  Continue to take your usual prescriptions as previously directed, including your new antibiotic prescription.  Call your regular medical doctor on Monday to schedule a follow up appointment within the next 2 days.  Return to the Emergency Department immediately if worsening.

## 2013-04-01 NOTE — ED Provider Notes (Signed)
CSN: [id number]     Arrival date & time 04/01/13  1341 History   First MD Initiated Contact with Patient 04/01/13 1529     Chief Complaint  Patient presents with  . Constipation     HPI Pt was seen at 1545.  Per pt, c/o gradual onset and persistence of constant constipation for the past 2 days. States he took an enema, miralax, castor oil with minimal results. Has been associated with generalized abd sensation "like I have to go to the bathroom." Denies N/V, no diarrhea, no rectal pain, no rectal bleeding, no dysuria, no fevers, no back pain, no rash, no CP/SOB, no black or blood in stools.       Past Medical History  Diagnosis Date  . Hypertension   . Gout     Acute episode involving ankle in 12/2011  . Hyperlipidemia   . Deep vein thrombophlebitis of left leg 11/2010    LLE; S/P THA  . Fasting hyperglycemia   . Stroke 08/2009; 07/2010    TIA in 07/2010  . H/O heat stroke 08/2009  . Prostate cancer 2013    adenocarcinoma; Gleason grade 8; PSA 9.45; EBRT 2013-14  . Renal cyst   . Syncope     01/2012: Negative pharmacologic stress nuclear  . PAF (paroxysmal atrial fibrillation)     a. first noted 01/2012 during admission for URI;  b. seen again 03/2012 after nstemi of unknown origin - pradaxa initiated.  Marland Kitchen CAD (coronary artery disease)     a. 01/2012 Elev Ti-->nonischemic MV;  b. 03/2012 NSTEMI/Cath: LM 50, LAD 50p/m, 78m/d, LCX nl, RI 60ost sup branch, RCA diff nonobs, EF 55%  . Radiation proctitis 02/2013  . NSTEMI (non-ST elevated myocardial infarction) 02/2013   Past Surgical History  Procedure Laterality Date  . Hip arthroplasty  12/08/2010    ARTHROPLASTY BIPOLAR HIP;  Surgeon-Olin; Left  . 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 I&d extremity  05/28/2011    Procedure: IRRIGATION AND DEBRIDEMENT EXTREMITY;  Surgeon: Linna Hoff, MD;  Location: [facility];  Service: Orthopedics;  Laterality: Right;  I & D Right hand/wrist  . Colonoscopy   07/18/2003     RMR: Rectum internal hemorrhoids.  Otherwise, normal rectum/ Left-sided diverticula.  Remainder of colonic mucosa-nl  . Flexible sigmoidoscopy N/A 03/18/2013    Procedure: FLEXIBLE SIGMOIDOSCOPY;  Surgeon: Wonda Horner, MD;  Location[facility] ENDOSCOPY;  Service: Endoscopy;  Laterality: N/A;   Family History  Problem Relation Age of Onset  . Prostate cancer Brother   . Colon cancer Sister 73  . Diabetes Mellitus II Father   . Stroke Father    History  Substance Use Topics  . Smoking status: Former Smoker    Types: Cigarettes, Cigars    Quit date: 01/27/1948  . Smokeless tobacco: Current User    Types: Chew     Comment: "quit smoking cigarettes age 32 or 39"  . Alcohol Use: No    Review of Systems ROS: Statement: All systems negative except as marked or noted in the HPI; Constitutional: Negative for fever and chills. ; ; Eyes: Negative for eye pain, redness and discharge. ; ; ENMT: Negative for ear pain, hoarseness, nasal congestion, sinus pressure and sore throat. ; ; Cardiovascular: Negative for chest pain, palpitations, diaphoresis, dyspnea and peripheral edema. ; ; Respiratory: Negative for cough, wheezing and stridor. ; ; Gastrointestinal: +constipation. Negative for nausea, vomiting, diarrhea, abdominal pain,  blood in stool, hematemesis, jaundice and rectal bleeding. . ; ; Genitourinary: Negative for dysuria, flank pain and hematuria. ; ; Musculoskeletal: Negative for back pain and neck pain. Negative for swelling and trauma.; ; Skin: Negative for pruritus, rash, abrasions, blisters, bruising and skin lesion.; ; Neuro: Negative for headache, lightheadedness and neck stiffness. Negative for weakness, altered level of consciousness , altered mental status, extremity weakness, paresthesias, involuntary movement, seizure and syncope.      Allergies  Tetanus toxoids  Home Medications   Current Outpatient Rx  Name  Route  Sig  Dispense  Refill  . albuterol (PROVENTIL) 4 MG tablet    Oral   Take 2 mg by mouth 2 (two) times daily.         Marland Kitchen allopurinol (ZYLOPRIM) 300 MG tablet   Oral   Take 300 mg by mouth every morning.          Marland Kitchen apixaban (ELIQUIS) 5 MG TABS tablet   Oral   Take 1 tablet (5 mg total) by mouth 2 (two) times daily.   60 tablet   0   . ascorbic acid (VITAMIN C) 500 MG tablet   Oral   Take 1,000 mg by mouth every evening.          . calcium-vitamin D (OSCAL WITH D) 500-200 MG-UNIT per tablet   Oral   Take 1 tablet by mouth every evening.          . castor oil liquid   Oral   Take by mouth once as needed for moderate constipation or severe constipation.         . chlorpheniramine-HYDROcodone (TUSSIONEX) 10-8 MG/5ML LQCR   Oral   Take 5 mLs by mouth 2 (two) times daily.         . COD LIVER OIL PO   Oral   Take 2 tablets by mouth 2 (two) times daily.          Marland Kitchen diltiazem (CARDIZEM CD) 180 MG 24 hr capsule   Oral   Take 180 mg by mouth every morning.          Marland Kitchen glipiZIDE (GLUCOTROL XL) 2.5 MG 24 hr tablet   Oral   Take 2.5 mg by mouth every morning.          Marland Kitchen levofloxacin (LEVAQUIN) 500 MG tablet   Oral   Take 500 mg by mouth daily. 10 day course starting on 03/31/2013         . metoprolol (LOPRESSOR) 50 MG tablet   Oral   Take 25 mg by mouth 2 (two) times daily.          . mineral oil enema   Rectal   Place 1 enema rectally once.         . Multiple Vitamin (MULITIVITAMIN WITH MINERALS) TABS   Oral   Take 1 tablet by mouth every evening.          . nitroGLYCERIN (NITROSTAT) 0.4 MG SL tablet   Sublingual   Place 0.4 mg under the tongue every 5 (five) minutes as needed for chest pain.         . polyethylene glycol (MIRALAX / GLYCOLAX) packet   Oral   Take 17 g by mouth daily as needed for mild constipation or severe constipation.         . predniSONE (DELTASONE) 10 MG tablet   Oral   Take 10-20 mg by mouth 2 (two) times daily as needed (Currently taking two  tablets in the morning and one  tablet at bedtime for gout). Hold this while you are on higher dose prednisone taper         . simvastatin (ZOCOR) 20 MG tablet   Oral   Take 20 mg by mouth every evening.          BP 113/68  Pulse 98  Temp(Src) 97.9 F (36.6 C) (Oral)  Resp 18  Ht 5\' 8"  (1.727 m)  Wt 225 lb (102.059 kg)  BMI 34.22 kg/m2  SpO2 96% Physical Exam 1550: Physical examination:  Nursing notes reviewed; Vital signs and O2 SAT reviewed;  Constitutional: Well developed, Well nourished, Well hydrated, In no acute distress; Head:  Normocephalic, atraumatic; Eyes: EOMI, PERRL, No scleral icterus; ENMT: Mouth and pharynx normal, Mucous membranes moist; Neck: Supple, Full range of motion, No lymphadenopathy; Cardiovascular: Regular rate and rhythm, No gallop; Respiratory: Breath sounds clear & equal bilaterally, No wheezes.  Speaking full sentences with ease, Normal respiratory effort/excursion; Chest: Nontender, Movement normal; Abdomen: Soft, Nontender, Nondistended, Normal bowel sounds; Genitourinary: No CVA tenderness; Extremities: Pulses normal, No tenderness, No edema, No calf edema or asymmetry.; Neuro: AA&Ox3, Major CN grossly intact.  Speech clear. No gross focal motor or sensory deficits in extremities. Climbs on and off stretcher easily by himself. Gait steady.; Skin: Color normal, Warm, Dry.   ED Course  Procedures     EKG Interpretation None      MDM  MDM Reviewed: previous chart, nursing note and vitals Reviewed previous: labs Interpretation: labs, CT scan and x-ray    Results for orders placed during the hospital encounter of 04/01/13  URINALYSIS, ROUTINE W REFLEX MICROSCOPIC      Result Value Ref Range   Color, Urine YELLOW  YELLOW   APPearance CLEAR  CLEAR   Specific Gravity, Urine 1.020  1.005 - 1.030   pH 6.0  5.0 - 8.0   Glucose, UA NEGATIVE  NEGATIVE mg/dL   Hgb urine dipstick NEGATIVE  NEGATIVE   Bilirubin Urine NEGATIVE  NEGATIVE   Ketones, ur NEGATIVE  NEGATIVE mg/dL    Protein, ur NEGATIVE  NEGATIVE mg/dL   Urobilinogen, UA 0.2  0.0 - 1.0 mg/dL   Nitrite NEGATIVE  NEGATIVE   Leukocytes, UA NEGATIVE  NEGATIVE  CBC WITH DIFFERENTIAL      Result Value Ref Range   WBC 9.3  4.0 - 10.5 K/uL   RBC 3.49 (*) 4.22 - 5.81 MIL/uL   Hemoglobin 11.7 (*) 13.0 - 17.0 g/dL   HCT 35.1 (*) 39.0 - 52.0 %   MCV 100.6 (*) 78.0 - 100.0 fL   MCH 33.5  26.0 - 34.0 pg   MCHC 33.3  30.0 - 36.0 g/dL   RDW 14.5  11.5 - 15.5 %   Platelets 156  150 - 400 K/uL   Neutrophils Relative % 92 (*) 43 - 77 %   Neutro Abs 8.5 (*) 1.7 - 7.7 K/uL   Lymphocytes Relative 3 (*) 12 - 46 %   Lymphs Abs 0.2 (*) 0.7 - 4.0 K/uL   Monocytes Relative 6  3 - 12 %   Monocytes Absolute 0.5  0.1 - 1.0 K/uL   Eosinophils Relative 0  0 - 5 %   Eosinophils Absolute 0.0  0.0 - 0.7 K/uL   Basophils Relative 0  0 - 1 %   Basophils Absolute 0.0  0.0 - 0.1 K/uL  COMPREHENSIVE METABOLIC PANEL      Result Value Ref Range   Sodium 137  137 - 147 mEq/L   Potassium 4.5  3.7 - 5.3 mEq/L   Chloride 101  96 - 112 mEq/L   CO2 23  19 - 32 mEq/L   Glucose, Bld 223 (*) 70 - 99 mg/dL   BUN 25 (*) 6 - 23 mg/dL   Creatinine, Ser 1.04  0.50 - 1.35 mg/dL   Calcium 9.6  8.4 - 10.5 mg/dL   Total Protein 6.4  6.0 - 8.3 g/dL   Albumin 3.0 (*) 3.5 - 5.2 g/dL   AST 24  0 - 37 U/L   ALT 30  0 - 53 U/L   Alkaline Phosphatase 75  39 - 117 U/L   Total Bilirubin 0.3  0.3 - 1.2 mg/dL   GFR calc non Af Amer 66 (*) >90 mL/min   GFR calc Af Amer 77 (*) >90 mL/min  LIPASE, BLOOD      Result Value Ref Range   Lipase 13  11 - 59 U/L  LACTIC ACID, PLASMA      Result Value Ref Range   Lactic Acid, Venous 2.8 (*) 0.5 - 2.2 mmol/L   Dg Chest 1 View 04/01/2013   CLINICAL DATA:  Constipation.  EXAM: CHEST - 1 VIEW  COMPARISON:  03/11/2013  FINDINGS: Mild cardiomegaly. Lungs are clear. No effusions or acute bony abnormality. No edema.  IMPRESSION: No active disease.   Electronically Signed   By: Rolm Baptise M.D.   On: 04/01/2013 17:46    Ct Abdomen Pelvis W Contrast 04/01/2013   CLINICAL DATA:  Abdominal pain.  Constipation.  EXAM: CT ABDOMEN AND PELVIS WITH CONTRAST  TECHNIQUE: Multidetector CT imaging of the abdomen and pelvis was performed using the standard protocol following bolus administration of intravenous contrast.  CONTRAST:  151mL OMNIPAQUE IOHEXOL 300 MG/ML SOLN, 67mL OMNIPAQUE IOHEXOL 300 MG/ML SOLN  COMPARISON:  11/09/2011  FINDINGS: There is mild bronchiectasis at the bases of the lower lobes, greater on the right. There is associated hazy opacity and reticular opacity at the posterior medial right lung base and minimal reticular opacity in the posterior medial left lung base. These findings are new from the prior study. A still may be chronic. The hazy opacity in the posterior medial right lung base suggests an acute infectious or inflammatory infiltrate.  Liver, spleen, gallbladder and pancreas are unremarkable. No bile duct dilation. No adrenal masses.  There are bilateral nonobstructing intrarenal stones and bilateral renal cysts is well as renal cortical thinning. This is stable. There are no ureteral stones. There is no hydronephrosis. Ureters are normal course and in caliber. The bladder is unremarkable. There is a ectasia of the abdominal aorta as well as some tortuosity and diffuse atherosclerotic plaque. This is stable.  No intraperitoneal or retroperitoneal adenopathy. There are no abnormal fluid collections.  There are a few colonic diverticula. No diverticulitis. No bowel wall thickening or inflammatory changes. Mild increased stool burden is noted throughout the colon. The small bowel is normal. A normal appendix is visualized.  A left hip prosthesis appears well seated and aligned. There are significant degenerative changes throughout the visualized spine with degenerative changes of the right hip. There is depression of the upper endplates of O16, L1 and L2 and moderate loss of vertebral body height of L5.  Depression of the upper endplates of W73 and L1 and the loss of height of L5 is new from the prior exam. Degenerative changes have also advanced.  IMPRESSION: 1. Mild increased stool burden throughout the colon. No obstruction.  2. There are scattered colonic diverticula but no diverticulitis. 3. Several compression fractures of the visualized spine and the prior study. They may all be chronic. It recent fracture should be suspected if there is significant back pain. 4. No other evidence of an acute abnormality. 5. Multiple chronic findings as detailed.   Electronically Signed   By: Lajean Manes M.D.   On: 04/01/2013 17:47    1845:  Pt does not want to stay in the ED any longer and wants to go home now.  Pt has gotten himself dressed and is sitting in a chair at his bedside, asking to be discharged now. States he "just came here to see if my colon was all blocked up."  Continues to deny CP/SOB/abd pain/back pain. Pt has tol PO well while in the ED without N/V. No stooling while in the ED.  Abd remains benign, resps easy. Lactic acid mildly elevated from "normal" lab range; IVF and PO fluids given. Pt does not want to stay for repeat level. WBC count normal and VS remain stable/afebrile/Sats 99% R/A. CT with possible early infiltrate. When this finding reviewed with pt, he states he was evaluated by his PMD yesterday and was rx levaquin and MDI for "what he thought was an early pneumonia."   Dx and testing d/w pt and family.  Questions answered.  Verb understanding, agreeable to d/c home with outpt f/u.        Alfonzo Feller, DO 04/05/13 1345

## 2013-04-01 NOTE — ED Notes (Signed)
Patient c/o constipation. Patient was admitted at [facility] 1 week ago and was told not to go 2 days without a BM because he had rectal bleeding due to patient disimpacting self. Patient denies any more rectal bleeding. Per patient took Miralax, Castrol oil, and enema with no relief.

## 2013-04-02 LAB — URINE CULTURE

## 2013-04-06 ENCOUNTER — Encounter: Payer: Self-pay | Admitting: Adult Health

## 2013-04-06 ENCOUNTER — Ambulatory Visit (INDEPENDENT_AMBULATORY_CARE_PROVIDER_SITE_OTHER): Payer: Medicare Other | Admitting: Adult Health

## 2013-04-06 VITALS — BP 153/68 | HR 108 | Ht 68.0 in

## 2013-04-06 DIAGNOSIS — I2510 Atherosclerotic heart disease of native coronary artery without angina pectoris: Secondary | ICD-10-CM

## 2013-04-06 DIAGNOSIS — I4891 Unspecified atrial fibrillation: Secondary | ICD-10-CM

## 2013-04-06 DIAGNOSIS — R55 Syncope and collapse: Secondary | ICD-10-CM

## 2013-04-06 NOTE — Progress Notes (Signed)
HPI: [name] is a 80 year old patient to be est. Dr. Pennelope Bracken we are following for ongoing assessment and management of atrial fibrillation, history of CAD, and hypertension. The patient was recently to the hospital non-ST elevated MI I, in February 2015. This recent cardiac catheterization in March of 2014 demonstrating,  nonobstructive CAD (presented with NSTEMI - peak trop 3.8) and preserved EF LM 50, LAD 50p/m, 8m/d, LCX nl, RI t sub branch, RCA diff nonobs, EF 55%.  The patient had fallen at home, and also had altered mental status. It was felt that the elevated troponins on most recent visit was related to gouty flareup and started on steroids,.  . He was noted to have atrial fibrillation with bradycardia, with an episode of RVR when dilitazem was stopped with rates in the 40s, but restarted with rates increasing to 120 beats per minute. He was continued on Lopressor and Paxil. Aspirin was discontinued, simvastatin was discontinued, and he was started on atorvastatin. Home dose of diltiazem was 180 mg daily. He is here for post hospital followup.    He comes today feeling better. His gout symptoms continue but are improved on prednisone. He denies any tachycardia, dyspnea on exertion, or bleeding symptoms. His main complaint is the cost of the Eliquis which he is taking twice a day. He would like to discuss an alternative to save him money. He also asked that we fill out the cardiovascular portion of his requests to be reinstated in a driver's license. His family members are with him asking multiple questions.     Allergies  Allergen Reactions  . Tetanus Toxoids Swelling    Also allergic to "high powered pain medications."    Current Outpatient Prescriptions  Medication Sig Dispense Refill  . albuterol (PROVENTIL) 4 MG tablet Take 2 mg by mouth 2 (two) times daily.      Marland Kitchen allopurinol (ZYLOPRIM) 300 MG tablet Take 300 mg by mouth every morning.       Marland Kitchen apixaban (ELIQUIS) 5 MG  TABS tablet Take 1 tablet (5 mg total) by mouth 2 (two) times daily.  60 tablet  0  . ascorbic acid (VITAMIN C) 500 MG tablet Take 1,000 mg by mouth every evening.       . calcium-vitamin D (OSCAL WITH D) 500-200 MG-UNIT per tablet Take 1 tablet by mouth every evening.       . castor oil liquid Take by mouth once as needed for moderate constipation or severe constipation.      . COD LIVER OIL PO Take 2 tablets by mouth 2 (two) times daily.       Marland Kitchen diltiazem (CARDIZEM CD) 180 MG 24 hr capsule Take 180 mg by mouth every morning.       Marland Kitchen glipiZIDE (GLUCOTROL XL) 2.5 MG 24 hr tablet Take 2.5 mg by mouth every morning.       Marland Kitchen levofloxacin (LEVAQUIN) 500 MG tablet Take 500 mg by mouth daily. 10 day course starting on 03/31/2013      . metoprolol (LOPRESSOR) 50 MG tablet 50 mg 2 (two) times daily. TAKEHALF TAB IN AM TAKE HALF TAB AT PM      . mineral oil enema Place 1 enema rectally once.      . Multiple Vitamin (MULITIVITAMIN WITH MINERALS) TABS Take 1 tablet by mouth every evening.       . nitroGLYCERIN (NITROSTAT) 0.4 MG SL tablet Place 0.4 mg under the tongue every 5 (five) minutes as needed for chest  pain.      . polyethylene glycol (MIRALAX / GLYCOLAX) packet Take 17 g by mouth daily as needed for mild constipation or severe constipation.      . predniSONE (DELTASONE) 10 MG tablet Take 10-20 mg by mouth 2 (two) times daily as needed (Currently taking two tablets in the morning and one tablet at bedtime for gout). Hold this while you are on higher dose prednisone taper      . simvastatin (ZOCOR) 20 MG tablet Take 20 mg by mouth every evening.       No current facility-administered medications for this visit.    Past Medical History  Diagnosis Date  . Hypertension   . Gout     Acute episode involving ankle in 12/2011  . Hyperlipidemia   . Deep vein thrombophlebitis of left leg 11/2010    LLE; S/P THA  . Fasting hyperglycemia   . Stroke 08/2009; 07/2010    TIA in 07/2010  . H/O heat stroke  08/2009  . Prostate cancer 2013    adenocarcinoma; Gleason grade 8; PSA 9.45; EBRT 2013-14  . Renal cyst   . Syncope     01/2012: Negative pharmacologic stress nuclear  . PAF (paroxysmal atrial fibrillation)     a. first noted 01/2012 during admission for URI;  b. seen again 03/2012 after nstemi of unknown origin - pradaxa initiated.  Marland Kitchen CAD (coronary artery disease)     a. 01/2012 Elev Ti-->nonischemic MV;  b. 03/2012 NSTEMI/Cath: LM 50, LAD 50p/m, 40m/d, LCX nl, RI 60ost sup branch, RCA diff nonobs, EF 55%  . Radiation proctitis 02/2013  . NSTEMI (non-ST elevated myocardial infarction) 02/2013    Past Surgical History  Procedure Laterality Date  . Hip arthroplasty  12/08/2010    ARTHROPLASTY BIPOLAR HIP;  Surgeon-Olin; Left  . 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 I&d extremity  05/28/2011    Procedure: IRRIGATION AND DEBRIDEMENT EXTREMITY;  Surgeon: Linna Hoff, MD;  Location: [facility];  Service: Orthopedics;  Laterality: Right;  I & D Right hand/wrist  . Colonoscopy   07/18/2003    RMR: Rectum internal hemorrhoids.  Otherwise, normal rectum/ Left-sided diverticula.  Remainder of colonic mucosa-nl  . Flexible sigmoidoscopy N/A 03/18/2013    Procedure: FLEXIBLE SIGMOIDOSCOPY;  Surgeon: Wonda Horner, MD;  Location: [facility] ENDOSCOPY;  Service: Endoscopy;  Laterality: N/A;  . Cardiac catheterization  03/2012    non-obstructive ASCAD, medial management only, pt does not want repeat cath    ROS: Review of systems complete and found to be negative unless listed above PHYSICAL EXAM BP 153/68  Pulse 108  Ht 5\' 8"  (1.727 m)  General: Well developed, well nourished, in no acute distress, obese. Head: Eyes PERRLA, No xanthomas.   Normal cephalic and atramatic  Lungs: Clear bilaterally to auscultation and percussion. Heart: HIRR S1 S2, without MRG.  Pulses are 2+ & equal.            No carotid bruit. No JVD.  No abdominal bruits. No femoral bruits. Abdomen: Bowel  sounds are positive, abdomen soft and non-tender without masses or                  Hernia's noted. Msk:  Back normal, slow gait, using walker for ambulation. Normal strength and tone for age. Extremities: No clubbing, cyanosis, mild dependent edema.  DP +1 Neuro: Alert and oriented X 3. Very hard of hearing Psych:  Good affect, responds appropriately   ASSESSMENT AND PLAN

## 2013-04-06 NOTE — Assessment & Plan Note (Signed)
Heart rate is currently controlled. It was rechecked in the exam room and found to be 78 beats per minute. He is requesting an alternative to Eliquis this is become too expensive for him. I have given him samples of Savaysa 60 mg to take daily. I have reviewed his Cr Cl at 83.14. He will finish his Eliquis Rx as he still has two weeks on this, and then begin the new medication. It is at a cost of $4 a month. This is explained to him and his family. He is given a 30 day free Rx as well which will need to have Rx written for him on next visit should he tolerate this medication. He will see Dr. Bronson Ing in 1 month.

## 2013-04-06 NOTE — Patient Instructions (Addendum)
Your physician recommends that you schedule a follow-up appointment in: 1 month with Dr.Koneswaran    Finish your Eliquis and then start Savaysa 60 mg daily-we gave you samples     Thank you for choosing [facility] !

## 2013-04-06 NOTE — Assessment & Plan Note (Signed)
Thought to be related to medication interactions. He has had no further complaints or symptoms.

## 2013-04-06 NOTE — Progress Notes (Deleted)
Name: [name]    DOB: 12/16/1931  Age: 80 y.o.  MR#: [id number]       PCP:  Leonides Grills, MD      Insurance: Payor: MEDICARE / Plan: MEDICARE PART A AND B / Product Type: *No Product type* /   CC:    Chief Complaint  Patient presents with  . Atrial Fibrillation  . Hypertension    VS Filed Vitals:   04/06/13 1318  BP: 153/68  Pulse: 108  Height: 5\' 8"  (1.727 m)    Weights Current Weight  04/01/13 225 lb (102.059 kg)  03/12/13 224 lb 6.9 oz (101.8 kg)  03/12/13 224 lb 6.9 oz (101.8 kg)    Blood Pressure  BP Readings from Last 3 Encounters:  04/06/13 153/68  04/01/13 121/93  03/23/13 131/69     Admit date:  (Not on file) Last encounter with RMR:  12/16/2012   Allergy Tetanus toxoids  Current Outpatient Prescriptions  Medication Sig Dispense Refill  . albuterol (PROVENTIL) 4 MG tablet Take 2 mg by mouth 2 (two) times daily.      Marland Kitchen allopurinol (ZYLOPRIM) 300 MG tablet Take 300 mg by mouth every morning.       Marland Kitchen apixaban (ELIQUIS) 5 MG TABS tablet Take 1 tablet (5 mg total) by mouth 2 (two) times daily.  60 tablet  0  . ascorbic acid (VITAMIN C) 500 MG tablet Take 1,000 mg by mouth every evening.       . calcium-vitamin D (OSCAL WITH D) 500-200 MG-UNIT per tablet Take 1 tablet by mouth every evening.       . castor oil liquid Take by mouth once as needed for moderate constipation or severe constipation.      . COD LIVER OIL PO Take 2 tablets by mouth 2 (two) times daily.       Marland Kitchen diltiazem (CARDIZEM CD) 180 MG 24 hr capsule Take 180 mg by mouth every morning.       Marland Kitchen glipiZIDE (GLUCOTROL XL) 2.5 MG 24 hr tablet Take 2.5 mg by mouth every morning.       Marland Kitchen levofloxacin (LEVAQUIN) 500 MG tablet Take 500 mg by mouth daily. 10 day course starting on 03/31/2013      . metoprolol (LOPRESSOR) 50 MG tablet 50 mg 2 (two) times daily. TAKEHALF TAB IN AM TAKE HALF TAB AT PM      . mineral oil enema Place 1 enema rectally once.      . Multiple Vitamin (MULITIVITAMIN WITH  MINERALS) TABS Take 1 tablet by mouth every evening.       . nitroGLYCERIN (NITROSTAT) 0.4 MG SL tablet Place 0.4 mg under the tongue every 5 (five) minutes as needed for chest pain.      . polyethylene glycol (MIRALAX / GLYCOLAX) packet Take 17 g by mouth daily as needed for mild constipation or severe constipation.      . predniSONE (DELTASONE) 10 MG tablet Take 10-20 mg by mouth 2 (two) times daily as needed (Currently taking two tablets in the morning and one tablet at bedtime for gout). Hold this while you are on higher dose prednisone taper      . simvastatin (ZOCOR) 20 MG tablet Take 20 mg by mouth every evening.       No current facility-administered medications for this visit.    Discontinued Meds:    Medications Discontinued During This Encounter  Medication Reason  . chlorpheniramine-HYDROcodone (TUSSIONEX) 10-8 MG/5ML [facility] Error    Patient  Active Problem List   Diagnosis Date Noted  . NSTEMI (non-ST elevated myocardial infarction) 03/11/2013  . Acute kidney injury 12/02/2012  . Gout attack 11/30/2012  . Lethargy 11/28/2012  . Unsteady gait 11/20/2012  . Acute encephalopathy 11/20/2012  . Generalized weakness 11/20/2012  . Chronic anticoagulation 11/20/2012  . Thrombocytopenia, unspecified 11/20/2012  . Acute gouty arthritis 04/12/2012  . Adrenal insufficiency 04/12/2012  . CAD (coronary artery disease) 04/10/2012  . Non-STEMI (non-ST elevated myocardial infarction) 04/09/2012  . Chest pain at rest 04/08/2012  . Hypertension   . Gout   . Hyperlipidemia   . Fasting hyperglycemia   . Cerebrovascular event   . Syncope   . Prostate cancer   . Atrial fibrillation 01/27/2012  . Deep vein thrombophlebitis of left leg 11/27/2010    LABS    Component Value Date/Time   NA 137 04/01/2013 1607   NA 139 03/19/2013 0637   NA 142 03/17/2013 1120   NA 147 11/20/2011 1036   K 4.5 04/01/2013 1607   K 4.4 03/19/2013 0637   K 4.2 03/17/2013 1120   K 4.5 11/20/2011 1036   CL 101  04/01/2013 1607   CL 107 03/19/2013 0637   CL 108 03/17/2013 1120   CL 109 11/20/2011 1036   CO2 23 04/01/2013 1607   CO2 22 03/19/2013 0637   CO2 23 03/17/2013 1120   CO2 25 11/20/2011 1036   GLUCOSE 223* 04/01/2013 1607   GLUCOSE 134* 03/19/2013 0637   GLUCOSE 138* 03/17/2013 1120   BUN 25* 04/01/2013 1607   BUN 22 03/19/2013 0637   BUN 32* 03/17/2013 1120   BUN 11 11/20/2011 1036   CREATININE 1.04 04/01/2013 1607   CREATININE 0.90 03/19/2013 0637   CREATININE 1.02 03/17/2013 1120   CREATININE 1.26 12/16/2012 1409   CREATININE 0.98 12/01/2011 1552   CREATININE 1.10 11/20/2011 1036   CALCIUM 9.6 04/01/2013 1607   CALCIUM 9.1 03/19/2013 0637   CALCIUM 9.2 03/17/2013 1120   CALCIUM 10.4 11/20/2011 1036   GFRNONAA 66* 04/01/2013 1607   GFRNONAA 79* 03/19/2013 0637   GFRNONAA 68* 03/17/2013 1120   GFRAA 77* 04/01/2013 1607   GFRAA >90 03/19/2013 0637   GFRAA 79* 03/17/2013 1120   CMP     Component Value Date/Time   NA 137 04/01/2013 1607   NA 147 11/20/2011 1036   K 4.5 04/01/2013 1607   K 4.5 11/20/2011 1036   CL 101 04/01/2013 1607   CL 109 11/20/2011 1036   CO2 23 04/01/2013 1607   CO2 25 11/20/2011 1036   GLUCOSE 223* 04/01/2013 1607   BUN 25* 04/01/2013 1607   BUN 11 11/20/2011 1036   CREATININE 1.04 04/01/2013 1607   CREATININE 1.26 12/16/2012 1409   CALCIUM 9.6 04/01/2013 1607   CALCIUM 10.4 11/20/2011 1036   PROT 6.4 04/01/2013 1607   PROT 6.2 11/20/2011 1036   ALBUMIN 3.0* 04/01/2013 1607   AST 24 04/01/2013 1607   AST 25 11/20/2011 1036   ALT 30 04/01/2013 1607   ALKPHOS 75 04/01/2013 1607   ALKPHOS 67 11/20/2011 1036   BILITOT 0.3 04/01/2013 1607   BILITOT 0.6 11/20/2011 1036   GFRNONAA 66* 04/01/2013 1607   GFRAA 77* 04/01/2013 1607       Component Value Date/Time   WBC 9.3 04/01/2013 1607   WBC 9.9 03/23/2013 0251   WBC 10.4 03/22/2013 0255   HGB 11.7* 04/01/2013 1607   HGB 11.8* 03/23/2013 0251   HGB 11.1* 03/22/2013 0255   HCT 35.1*  04/01/2013 1607   HCT 35.1* 03/23/2013 0251   HCT 33.1* 03/22/2013 0255    HCT 43 11/20/2011 1039   MCV 100.6* 04/01/2013 1607   MCV 98.9 03/23/2013 0251   MCV 99.4 03/22/2013 0255    Lipid Panel     Component Value Date/Time   CHOL 175 08/12/2010 0544   TRIG 161* 08/12/2010 0544   HDL 32* 08/12/2010 0544   CHOLHDL 5.5 08/12/2010 0544   VLDL 32 08/12/2010 0544   LDLCALC 111* 08/12/2010 0544    ABG    Component Value Date/Time   PHART 7.465* 11/20/2012 1515   PCO2ART 35.9 11/20/2012 1515   PO2ART 54.9* 11/20/2012 1515   HCO3 26.6* 11/27/2012 1923   TCO2 28 11/27/2012 1923   O2SAT 42.0 11/27/2012 1923     Lab Results  Component Value Date   TSH 1.347 11/20/2012   BNP (last 3 results)  Recent Labs  05/04/12 2006  PROBNP 353.7   Cardiac Panel (last 3 results) No results found for this basename: CKTOTAL, CKMB, TROPONINI, RELINDX,  in the last 72 hours  Iron/TIBC/Ferritin No results found for this basename: iron, tibc, ferritin     EKG Orders placed during the hospital encounter of 03/11/13  . ED EKG  . EKG 12-LEAD  . EKG 12-LEAD  . EKG  . EKG 12-LEAD  . EKG 12-LEAD     Prior Assessment and Plan Problem List as of 04/06/2013     Cardiovascular and Mediastinum   Hypertension   Last Assessment & Plan   12/16/2012 Office Visit Written 12/16/2012  1:42 PM by Lendon Colonel, NP     Blood pressure is well controlled currently. He is taking his time with position change. He states he is not dizzy or lightheaded. He has not fallen since discharge.    Deep vein thrombophlebitis of left leg   Last Assessment & Plan   03/15/2012 Office Visit Written 03/16/2012 12:17 PM by Yehuda Savannah, MD     DVT occurred postoperatively following total hip arthroplasty. Adequate course of anticoagulation has been completed. No further treatment required for this problem.    Cerebrovascular event   Syncope   Last Assessment & Plan   03/15/2012 Office Visit Written 03/16/2012 12:25 PM by Yehuda Savannah, MD     Etiology of syncope uncertain; no further testing  warranted at present.    Atrial fibrillation   Last Assessment & Plan   12/16/2012 Office Visit Edited 12/16/2012  1:45 PM by Lendon Colonel, NP     Heart rate is controlled on diltiazem and metoprolol. He is given samples of Pradaxa. Close follow up with CBC as he was mildly anemic on discharge. Will repeat CBC. He will watch for bleeding in stool and urine.  He is to report this to [place]. Also with recent fall, will watch closely as well, for need to stop anticoagulation.    Non-STEMI (non-ST elevated myocardial infarction)   Last Assessment & Plan   04/18/2012 Office Visit Written 04/18/2012  3:36 PM by Lendon Colonel, NP     Cardiac catheterization completed during recent hospitalization on 04/12/2012 demonstrated nonobstructive CAD. Continue on ACE inhibitor, beta blocker, and aspirin. No further cardiac testing will be completed at this time. His ejection fraction was found to be normal at 60% per echocardiogram.    CAD (coronary artery disease)   Last Assessment & Plan   12/16/2012 Office Visit Written 12/16/2012  1:45 PM by Lendon Colonel, NP  Denies chest pain or DOE.    NSTEMI (non-ST elevated myocardial infarction)     Endocrine   Fasting hyperglycemia   Last Assessment & Plan   03/15/2012 Office Visit Edited 03/17/2012  8:46 PM by Yehuda Savannah, MD     FBGs obtained on an outpatient basis over the past few months range from 97-118. While measurement of a hemoglobin A1c level would be reassuring, it does not appear that pharmacologic therapy is warranted at present.    Adrenal insufficiency     Nervous and Auditory   Acute encephalopathy     Genitourinary   Prostate cancer   Last Assessment & Plan   03/15/2012 Office Visit Written 03/16/2012 12:24 PM by Yehuda Savannah, MD     Currently receiving course of radiation therapy    Acute kidney injury     Other   Gout   Last Assessment & Plan   04/18/2012 Office Visit Written 04/18/2012  3:35 PM by Lendon Colonel, NP     He is being followed by primary care and as having an appointment tomorrow concerning his underlying gout symptoms.    Hyperlipidemia   Last Assessment & Plan   03/15/2012 Office Visit Written 03/16/2012 12:23 PM by Yehuda Savannah, MD     No recent assessment of lipids. Values in 2012 were somewhat suboptimal, but adequate in the absence of known coronary disease.  A repeat assessment would be useful and will be obtained.    Chest pain at rest   Acute gouty arthritis   Unsteady gait   Last Assessment & Plan   12/16/2012 Office Visit Written 12/16/2012  1:44 PM by Lendon Colonel, NP     Working with PT at home. Getting stronger each day. Now using walker.    Generalized weakness   Chronic anticoagulation   Thrombocytopenia, unspecified   Lethargy   Gout attack       Imaging: Dg Chest 1 View  04/01/2013   CLINICAL DATA:  Constipation.  EXAM: CHEST - 1 VIEW  COMPARISON:  03/11/2013  FINDINGS: Mild cardiomegaly. Lungs are clear. No effusions or acute bony abnormality. No edema.  IMPRESSION: No active disease.   Electronically Signed   By: Rolm Baptise M.D.   On: 04/01/2013 17:46   Dg Pelvis 1-2 Views  03/11/2013   CLINICAL DATA:  Fall  EXAM: PELVIS - 1-2 VIEW  COMPARISON:  None.  FINDINGS: There is no evidence of pelvic fracture or diastasis. No other pelvic bone lesions are seen. Suboptimal visualization due to body habitus and technique. Clips project over the pelvis. Left total hip arthroplasty is grossly unremarkable but incompletely visualized.  IMPRESSION: No displaced pelvic fracture allowing for technique as above.   Electronically Signed   By: Conchita Paris M.D.   On: 03/11/2013 19:22   Dg Elbow Complete Left  03/11/2013   CLINICAL DATA:  Fall  EXAM: LEFT ELBOW - COMPLETE 3+ VIEW  COMPARISON:  None.  FINDINGS: There is no evidence of fracture, dislocation, or joint effusion. There is no evidence of arthropathy or other focal bone abnormality. Soft tissues  are unremarkable. The patient was unable to cooperate for optimal positioning.  IMPRESSION: Negative.   Electronically Signed   By: Conchita Paris M.D.   On: 03/11/2013 19:21   Dg Elbow Complete Right  03/11/2013   CLINICAL DATA:  Fall  EXAM: RIGHT ELBOW - COMPLETE 3+ VIEW  COMPARISON:  None.  FINDINGS: There is no evidence of fracture,  dislocation, or joint effusion. There is no evidence of arthropathy or other focal bone abnormality. Soft tissues are unremarkable. The patient was unable to be properly positioned for all views.  IMPRESSION: Negative.   Electronically Signed   By: Conchita Paris M.D.   On: 03/11/2013 19:22   Ct Head Wo Contrast  03/11/2013   CLINICAL DATA:  Fall, altered mental status and headache  EXAM: CT HEAD WITHOUT CONTRAST  TECHNIQUE: Contiguous axial images were obtained from the base of the skull through the vertex without intravenous contrast.  COMPARISON:  Prior CT scan of the head 11/27/2012  FINDINGS: Negative for acute intracranial hemorrhage, acute infarction, mass, mass effect, hydrocephalus or midline shift. Gray-white differentiation is preserved throughout. No acute soft tissue or calvarial abnormality. Stable cerebral and cerebellar volume loss with compensatory ex vacuo dilatation of the lateral ventricles. Mild periventricular white matter hypoattenuation consistent with longstanding microvascular ischemic white matter changes. Chronic partial right mastoid effusion. The left mastoid air cells are well aerated. Partial opacification of the left sphenoid sinus. This is a new finding compared to prior. Atherosclerotic calcification in the bilateral cavernous and supra clinoid carotid arteries.  IMPRESSION: 1. No acute intracranial abnormality. 2. Developing partial opacification of the left sphenoid sinus consistent with inflammatory paranasal sinus disease. 3. Chronic partial right mastoid opacification. Query chronic mastoiditis? 4. Stable cerebral and cerebellar  atrophy, mild chronic ischemic white matter changes and intracranial atherosclerosis.   Electronically Signed   By: Jacqulynn Cadet M.D.   On: 03/11/2013 19:34   Ct Abdomen Pelvis W Contrast  04/01/2013   CLINICAL DATA:  Abdominal pain.  Constipation.  EXAM: CT ABDOMEN AND PELVIS WITH CONTRAST  TECHNIQUE: Multidetector CT imaging of the abdomen and pelvis was performed using the standard protocol following bolus administration of intravenous contrast.  CONTRAST:  153mL OMNIPAQUE IOHEXOL 300 MG/ML SOLN, 63mL OMNIPAQUE IOHEXOL 300 MG/ML SOLN  COMPARISON:  11/09/2011  FINDINGS: There is mild bronchiectasis at the bases of the lower lobes, greater on the right. There is associated hazy opacity and reticular opacity at the posterior medial right lung base and minimal reticular opacity in the posterior medial left lung base. These findings are new from the prior study. A still may be chronic. The hazy opacity in the posterior medial right lung base suggests an acute infectious or inflammatory infiltrate.  Liver, spleen, gallbladder and pancreas are unremarkable. No bile duct dilation. No adrenal masses.  There are bilateral nonobstructing intrarenal stones and bilateral renal cysts is well as renal cortical thinning. This is stable. There are no ureteral stones. There is no hydronephrosis. Ureters are normal course and in caliber. The bladder is unremarkable. There is a ectasia of the abdominal aorta as well as some tortuosity and diffuse atherosclerotic plaque. This is stable.  No intraperitoneal or retroperitoneal adenopathy. There are no abnormal fluid collections.  There are a few colonic diverticula. No diverticulitis. No bowel wall thickening or inflammatory changes. Mild increased stool burden is noted throughout the colon. The small bowel is normal. A normal appendix is visualized.  A left hip prosthesis appears well seated and aligned. There are significant degenerative changes throughout the visualized spine  with degenerative changes of the right hip. There is depression of the upper endplates of E93, L1 and L2 and moderate loss of vertebral body height of L5. Depression of the upper endplates of Y10 and L1 and the loss of height of L5 is new from the prior exam. Degenerative changes have also advanced.  IMPRESSION:  1. Mild increased stool burden throughout the colon. No obstruction. 2. There are scattered colonic diverticula but no diverticulitis. 3. Several compression fractures of the visualized spine and the prior study. They may all be chronic. It recent fracture should be suspected if there is significant back pain. 4. No other evidence of an acute abnormality. 5. Multiple chronic findings as detailed.   Electronically Signed   By: Lajean Manes M.D.   On: 04/01/2013 17:47   Dg Chest Port 1 View  03/11/2013   CLINICAL DATA:  Non ST elevation myocardial infarction  EXAM: PORTABLE CHEST - 1 VIEW  COMPARISON:  Prior chest x-ray 11/06/2012  FINDINGS: Stable cardiac and mediastinal contours with mild cardiomegaly. Atherosclerotic calcification noted in the transverse aorta. Negative for pulmonary edema. No pneumothorax, focal airspace consolidation or pleural effusion. No acute osseous abnormality.  IMPRESSION: Stable cardiomegaly without evidence of failure.   Electronically Signed   By: Jacqulynn Cadet M.D.   On: 03/11/2013 19:56   Dg Knee Complete 4 Views Right  03/11/2013   CLINICAL DATA:  Fall  EXAM: RIGHT KNEE - COMPLETE 4+ VIEW  COMPARISON:  None.  FINDINGS: There is no evidence of fracture, dislocation, or joint effusion. There is no evidence of arthropathy or other focal bone abnormality. Soft tissues are unremarkable. Tricompartmental degenerative change is noted. Detail is obscured by patient inability to remain still for the examination. Vascular calcifications are noted. No suprapatellar effusion.  IMPRESSION: Negative.   Electronically Signed   By: Conchita Paris M.D.   On: 03/11/2013 19:21

## 2013-04-06 NOTE — Assessment & Plan Note (Signed)
Denies chest pain or DOE. States he is walking around the house and feeling stronger everyday.

## 2013-04-29 ENCOUNTER — Encounter (HOSPITAL_COMMUNITY): Payer: Self-pay | Admitting: Emergency Medicine

## 2013-04-29 ENCOUNTER — Emergency Department (HOSPITAL_COMMUNITY)
Admission: EM | Admit: 2013-04-29 | Discharge: 2013-04-29 | Disposition: A | Discharge status: Home / Self Care | Payer: Medicare Other | Attending: Emergency Medicine | Admitting: Emergency Medicine

## 2013-04-29 DIAGNOSIS — Z87891 Personal history of nicotine dependence: Secondary | ICD-10-CM | POA: Insufficient documentation

## 2013-04-29 DIAGNOSIS — Z8719 Personal history of other diseases of the digestive system: Secondary | ICD-10-CM | POA: Insufficient documentation

## 2013-04-29 DIAGNOSIS — I10 Essential (primary) hypertension: Secondary | ICD-10-CM | POA: Insufficient documentation

## 2013-04-29 DIAGNOSIS — Z87448 Personal history of other diseases of urinary system: Secondary | ICD-10-CM | POA: Insufficient documentation

## 2013-04-29 DIAGNOSIS — R609 Edema, unspecified: Secondary | ICD-10-CM | POA: Insufficient documentation

## 2013-04-29 DIAGNOSIS — I252 Old myocardial infarction: Secondary | ICD-10-CM | POA: Insufficient documentation

## 2013-04-29 DIAGNOSIS — E119 Type 2 diabetes mellitus without complications: Secondary | ICD-10-CM | POA: Insufficient documentation

## 2013-04-29 DIAGNOSIS — B029 Zoster without complications: Secondary | ICD-10-CM | POA: Insufficient documentation

## 2013-04-29 DIAGNOSIS — Z792 Long term (current) use of antibiotics: Secondary | ICD-10-CM | POA: Insufficient documentation

## 2013-04-29 DIAGNOSIS — I4891 Unspecified atrial fibrillation: Secondary | ICD-10-CM | POA: Insufficient documentation

## 2013-04-29 DIAGNOSIS — Z79899 Other long term (current) drug therapy: Secondary | ICD-10-CM | POA: Insufficient documentation

## 2013-04-29 DIAGNOSIS — Z9889 Other specified postprocedural states: Secondary | ICD-10-CM | POA: Insufficient documentation

## 2013-04-29 DIAGNOSIS — I2510 Atherosclerotic heart disease of native coronary artery without angina pectoris: Secondary | ICD-10-CM | POA: Insufficient documentation

## 2013-04-29 DIAGNOSIS — IMO0002 Reserved for concepts with insufficient information to code with codable children: Secondary | ICD-10-CM | POA: Insufficient documentation

## 2013-04-29 DIAGNOSIS — E785 Hyperlipidemia, unspecified: Secondary | ICD-10-CM | POA: Insufficient documentation

## 2013-04-29 DIAGNOSIS — M109 Gout, unspecified: Secondary | ICD-10-CM | POA: Insufficient documentation

## 2013-04-29 DIAGNOSIS — Z8673 Personal history of transient ischemic attack (TIA), and cerebral infarction without residual deficits: Secondary | ICD-10-CM | POA: Insufficient documentation

## 2013-04-29 DIAGNOSIS — Z8546 Personal history of malignant neoplasm of prostate: Secondary | ICD-10-CM | POA: Insufficient documentation

## 2013-04-29 DIAGNOSIS — Z7901 Long term (current) use of anticoagulants: Secondary | ICD-10-CM | POA: Insufficient documentation

## 2013-04-29 DIAGNOSIS — Z86718 Personal history of other venous thrombosis and embolism: Secondary | ICD-10-CM | POA: Insufficient documentation

## 2013-04-29 MED ORDER — SULFAMETHOXAZOLE-TRIMETHOPRIM 800-160 MG PO TABS: 1.0000 | ORAL_TABLET | Freq: Two times a day (BID) | ORAL | Status: DC

## 2013-04-29 MED ORDER — VALACYCLOVIR HCL 1 G PO TABS: 1000.0000 mg | ORAL_TABLET | Freq: Three times a day (TID) | ORAL | Status: DC

## 2013-04-29 NOTE — ED Provider Notes (Signed)
CSN: [id number]     Arrival date & time 04/29/13  1002 History  This chart was scribed for [employer]. Alvino Chapel, MD,  by Stacy Gardner, ED Scribe. The patient was seen in room APA17/APA17 and the patient's care was started at 11:02 AM.    First MD Initiated Contact with Patient 04/29/13 1056     Chief Complaint  Patient presents with  . Rash     (Consider location/radiation/quality/duration/timing/severity/associated sxs/prior Treatment) Patient is a 80 y.o. male presenting with rash. The history is provided by the patient, medical records and a relative. No language interpreter was used.  Rash Associated symptoms: no fever    HPI Comments: [name] is a 80 y.o. male who presents to the Emergency Department complaining of rash to his right upper thigh and groin area that inially appeared last night. Pt was wearing pull ups and sweat pants when he urinated and soaked his clothing overnight. Denies fever and chills.  His glucose level was 167 this morning and he reports regularly monitoring his DM as instructed. Nothing seems to make his symptoms better. He did not mention taking anything for his symptoms.  Past Medical History  Diagnosis Date  . Hypertension   . Gout     Acute episode involving ankle in 12/2011  . Hyperlipidemia   . Deep vein thrombophlebitis of left leg 11/2010    LLE; S/P THA  . Fasting hyperglycemia   . Stroke 08/2009; 07/2010    TIA in 07/2010  . H/O heat stroke 08/2009  . Prostate cancer 2013    adenocarcinoma; Gleason grade 8; PSA 9.45; EBRT 2013-14  . Renal cyst   . Syncope     01/2012: Negative pharmacologic stress nuclear  . PAF (paroxysmal atrial fibrillation)     a. first noted 01/2012 during admission for URI;  b. seen again 03/2012 after nstemi of unknown origin - pradaxa initiated.  Marland Kitchen CAD (coronary artery disease)     a. 01/2012 Elev Ti-->nonischemic MV;  b. 03/2012 NSTEMI/Cath: LM 50, LAD 50p/m, 61m/d, LCX nl, RI 60ost sup branch, RCA diff nonobs,  EF 55%  . Radiation proctitis 02/2013  . NSTEMI (non-ST elevated myocardial infarction) 02/2013  . Diabetes mellitus without complication    Past Surgical History  Procedure Laterality Date  . Hip arthroplasty  12/08/2010    ARTHROPLASTY BIPOLAR HIP;  Surgeon-Olin; Left  . 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 I&d extremity  05/28/2011    Procedure: IRRIGATION AND DEBRIDEMENT EXTREMITY;  Surgeon: Linna Hoff, MD;  Location: [place];  Service: Orthopedics;  Laterality: Right;  I & D Right hand/wrist  . Colonoscopy   07/18/2003    RMR: Rectum internal hemorrhoids.  Otherwise, normal rectum/ Left-sided diverticula.  Remainder of colonic mucosa-nl  . Flexible sigmoidoscopy N/A 03/18/2013    Procedure: FLEXIBLE SIGMOIDOSCOPY;  Surgeon: Wonda Horner, MD;  Location: [facility] ENDOSCOPY;  Service: Endoscopy;  Laterality: N/A;  . Cardiac catheterization  03/2012    non-obstructive ASCAD, medial management only, pt does not want repeat cath   Family History  Problem Relation Age of Onset  . Prostate cancer Brother   . Colon cancer Sister 62  . Diabetes Mellitus II Father   . Stroke Father    History  Substance Use Topics  . Smoking status: Former Smoker    Types: Cigarettes, Cigars    Quit date: 01/27/1948  . Smokeless tobacco: Current User  Types: Chew     Comment: "quit smoking cigarettes age 76 or 70"  . Alcohol Use: No    Review of Systems  Constitutional: Negative for fever and chills.  Skin: Positive for rash.  All other systems reviewed and are negative.      Allergies  Tetanus toxoids  Home Medications   Current Outpatient Rx  Name  Route  Sig  Dispense  Refill  . albuterol (PROVENTIL) 4 MG tablet   Oral   Take 2 mg by mouth 2 (two) times daily.         Marland Kitchen allopurinol (ZYLOPRIM) 300 MG tablet   Oral   Take 300 mg by mouth every morning.          Marland Kitchen apixaban (ELIQUIS) 5 MG TABS tablet   Oral   Take 1 tablet (5 mg total) by mouth  2 (two) times daily.   60 tablet   0   . ascorbic acid (VITAMIN C) 500 MG tablet   Oral   Take 1,000 mg by mouth every evening.          . calcium-vitamin D (OSCAL WITH D) 500-200 MG-UNIT per tablet   Oral   Take 1 tablet by mouth every evening.          . castor oil liquid   Oral   Take by mouth once as needed for moderate constipation or severe constipation.         . COD LIVER OIL PO   Oral   Take 2 tablets by mouth 2 (two) times daily.          Marland Kitchen diltiazem (CARDIZEM CD) 180 MG 24 hr capsule   Oral   Take 180 mg by mouth every morning.          Marland Kitchen glipiZIDE (GLUCOTROL XL) 2.5 MG 24 hr tablet   Oral   Take 2.5 mg by mouth every morning.          Marland Kitchen levofloxacin (LEVAQUIN) 500 MG tablet   Oral   Take 500 mg by mouth daily. 10 day course starting on 03/31/2013         . metoprolol (LOPRESSOR) 50 MG tablet      50 mg 2 (two) times daily. TAKEHALF TAB IN AM TAKE HALF TAB AT PM         . mineral oil enema   Rectal   Place 1 enema rectally once.         . Multiple Vitamin (MULITIVITAMIN WITH MINERALS) TABS   Oral   Take 1 tablet by mouth every evening.          . nitroGLYCERIN (NITROSTAT) 0.4 MG SL tablet   Sublingual   Place 0.4 mg under the tongue every 5 (five) minutes as needed for chest pain.         . polyethylene glycol (MIRALAX / GLYCOLAX) packet   Oral   Take 17 g by mouth daily as needed for mild constipation or severe constipation.         . predniSONE (DELTASONE) 10 MG tablet   Oral   Take 10-20 mg by mouth 2 (two) times daily as needed (Currently taking two tablets in the morning and one tablet at bedtime for gout). Hold this while you are on higher dose prednisone taper         . simvastatin (ZOCOR) 20 MG tablet   Oral   Take 20 mg by mouth every evening.  BP 116/71  Pulse 96  Temp(Src) 98 F (36.7 C) (Oral)  Resp 20  SpO2 100% Physical Exam  Nursing note and vitals reviewed. Constitutional: He is oriented  to person, place, and time. He appears well-developed and well-nourished. No distress.  HENT:  Head: Normocephalic and atraumatic.  Neck: No tracheal deviation present.  Cardiovascular: Normal rate.   Pulmonary/Chest: Effort normal. No respiratory distress.  Abdominal: Soft. He exhibits no distension.  Musculoskeletal: Normal range of motion. He exhibits edema (bialteral pitting edema).        Neurological: He is alert and oriented to person, place, and time.  Skin: Skin is warm and dry. Rash noted. There is erythema.  vesicular rash with erythema from the L1 to L2 that extends to right buttock No purulent drainage Minimal tenderness  Psychiatric: He has a normal mood and affect. His behavior is normal.    ED Course  Procedures (including critical care time) DIAGNOSTIC STUDIES: Oxygen Saturation is 100% on room air, normal by my interpretation.    COORDINATION OF CARE:  11:06 AM Discussed course of care with pt . Pt understands and agrees.    Labs Review Labs Reviewed - No data to display Imaging Review No results found.   EKG Interpretation None      MDM   Final diagnoses:  None    Patient with dermatomal rash with vesicles to right groin area. It is not tender, however has to be considered a shingles. Patient is convinced that it is from just being wet with urine, however looks less like complaint dermatitis. Will treat with antivirals and antibiotics.   I personally performed the services described in this documentation, which was scribed in my presence. The recorded information has been reviewed and is accurate.       Jasper Riling. Alvino Chapel, MD 04/29/13 1248

## 2013-04-29 NOTE — ED Notes (Signed)
Pt c/o rash-like area and pain to right upper thigh and groin area. Pt states he first noticed area last night and symptoms became worse this morning.

## 2013-04-29 NOTE — Discharge Instructions (Signed)
Shingles Shingles (herpes zoster) is an infection that is caused by the same virus that causes chickenpox (varicella). The infection causes a painful skin rash and fluid-filled blisters, which eventually break open, crust over, and heal. It may occur in any area of the body, but it usually affects only one side of the body or face. The pain of shingles usually lasts about 1 month. However, some people with shingles may develop long-term (chronic) pain in the affected area of the body. Shingles often occurs many years after the person had chickenpox. It is more common:  In people older than 50 years.  In people with weakened immune systems, such as those with HIV, AIDS, or cancer.  In people taking medicines that weaken the immune system, such as transplant medicines.  In people under great stress. CAUSES  Shingles is caused by the varicella zoster virus (VZV), which also causes chickenpox. After a person is infected with the virus, it can remain in the person's body for years in an inactive state (dormant). To cause shingles, the virus reactivates and breaks out as an infection in a nerve root. The virus can be spread from person to person (contagious) through contact with open blisters of the shingles rash. It will only spread to people who have not had chickenpox. When these people are exposed to the virus, they may develop chickenpox. They will not develop shingles. Once the blisters scab over, the person is no longer contagious and cannot spread the virus to others. SYMPTOMS  Shingles shows up in stages. The initial symptoms may be pain, itching, and tingling in an area of the skin. This pain is usually described as burning, stabbing, or throbbing.In a few days or weeks, a painful red rash will appear in the area where the pain, itching, and tingling were felt. The rash is usually on one side of the body in a band or belt-like pattern. Then, the rash usually turns into fluid-filled blisters. They  will scab over and dry up in approximately 2 3 weeks. Flu-like symptoms may also occur with the initial symptoms, the rash, or the blisters. These may include:  Fever.  Chills.  Headache.  Upset stomach. DIAGNOSIS  Your caregiver will perform a skin exam to diagnose shingles. Skin scrapings or fluid samples may also be taken from the blisters. This sample will be examined under a microscope or sent to a lab for further testing. TREATMENT  There is no specific cure for shingles. Your caregiver will likely prescribe medicines to help you manage the pain, recover faster, and avoid long-term problems. This may include antiviral drugs, anti-inflammatory drugs, and pain medicines. HOME CARE INSTRUCTIONS   Take a cool bath or apply cool compresses to the area of the rash or blisters as directed. This may help with the pain and itching.   Only take over-the-counter or prescription medicines as directed by your caregiver.   Rest as directed by your caregiver.  Keep your rash and blisters clean with mild soap and cool water or as directed by your caregiver.  Do not pick your blisters or scratch your rash. Apply an anti-itch cream or numbing creams to the affected area as directed by your caregiver.  Keep your shingles rash covered with a loose bandage (dressing).  Avoid skin contact with:  Babies.   Pregnant women.   Children with eczema.   Elderly people with transplants.   People with chronic illnesses, such as leukemia or AIDS.   Wear loose-fitting clothing to help ease   the pain of material rubbing against the rash.  Keep all follow-up appointments with your caregiver.If the area involved is on your face, you may receive a referral for follow-up to a specialist, such as an eye doctor (ophthalmologist) or an ear, nose, and throat (ENT) doctor. Keeping all follow-up appointments will help you avoid eye complications, chronic pain, or disability.  SEEK IMMEDIATE MEDICAL  CARE IF:   You have facial pain, pain around the eye area, or loss of feeling on one side of your face.  You have ear pain or ringing in your ear.  You have loss of taste.  Your pain is not relieved with prescribed medicines.   Your redness or swelling spreads.   You have more pain and swelling.  Your condition is worsening or has changed.   You have a feveror persistent symptoms for more than 2 3 days.  You have a fever and your symptoms suddenly get worse. MAKE SURE YOU:  Understand these instructions.  Will watch your condition.  Will get help right away if you are not doing well or get worse. Document Released: 01/12/2005 Document Revised: 10/07/2011 Document Reviewed: 08/27/2011 [employer] Patient Information 2014 [employer].  

## 2013-05-06 ENCOUNTER — Emergency Department (HOSPITAL_COMMUNITY): Payer: Medicare Other

## 2013-05-06 ENCOUNTER — Inpatient Hospital Stay (HOSPITAL_COMMUNITY)
Admission: EM | Admit: 2013-05-06 | Discharge: 2013-05-08 | DRG: 596 | Disposition: A | Discharge status: Home / Self Care | Payer: Medicare Other | Attending: Internal Medicine | Admitting: Internal Medicine

## 2013-05-06 ENCOUNTER — Encounter (HOSPITAL_COMMUNITY): Payer: Self-pay | Admitting: Emergency Medicine

## 2013-05-06 DIAGNOSIS — R531 Weakness: Secondary | ICD-10-CM | POA: Diagnosis present

## 2013-05-06 DIAGNOSIS — Z79899 Other long term (current) drug therapy: Secondary | ICD-10-CM

## 2013-05-06 DIAGNOSIS — I4891 Unspecified atrial fibrillation: Secondary | ICD-10-CM | POA: Diagnosis present

## 2013-05-06 DIAGNOSIS — I2510 Atherosclerotic heart disease of native coronary artery without angina pectoris: Secondary | ICD-10-CM | POA: Diagnosis present

## 2013-05-06 DIAGNOSIS — Z7901 Long term (current) use of anticoagulants: Secondary | ICD-10-CM

## 2013-05-06 DIAGNOSIS — Z87891 Personal history of nicotine dependence: Secondary | ICD-10-CM

## 2013-05-06 DIAGNOSIS — Z8672 Personal history of thrombophlebitis: Secondary | ICD-10-CM

## 2013-05-06 DIAGNOSIS — R2681 Unsteadiness on feet: Secondary | ICD-10-CM | POA: Diagnosis present

## 2013-05-06 DIAGNOSIS — Z800 Family history of malignant neoplasm of digestive organs: Secondary | ICD-10-CM

## 2013-05-06 DIAGNOSIS — Z8042 Family history of malignant neoplasm of prostate: Secondary | ICD-10-CM

## 2013-05-06 DIAGNOSIS — G9340 Encephalopathy, unspecified: Secondary | ICD-10-CM

## 2013-05-06 DIAGNOSIS — Z8546 Personal history of malignant neoplasm of prostate: Secondary | ICD-10-CM

## 2013-05-06 DIAGNOSIS — E119 Type 2 diabetes mellitus without complications: Secondary | ICD-10-CM | POA: Diagnosis present

## 2013-05-06 DIAGNOSIS — Z66 Do not resuscitate: Secondary | ICD-10-CM | POA: Diagnosis present

## 2013-05-06 DIAGNOSIS — Z8673 Personal history of transient ischemic attack (TIA), and cerebral infarction without residual deficits: Secondary | ICD-10-CM

## 2013-05-06 DIAGNOSIS — E785 Hyperlipidemia, unspecified: Secondary | ICD-10-CM | POA: Diagnosis present

## 2013-05-06 DIAGNOSIS — I252 Old myocardial infarction: Secondary | ICD-10-CM

## 2013-05-06 DIAGNOSIS — I10 Essential (primary) hypertension: Secondary | ICD-10-CM | POA: Diagnosis present

## 2013-05-06 DIAGNOSIS — R269 Unspecified abnormalities of gait and mobility: Secondary | ICD-10-CM | POA: Diagnosis present

## 2013-05-06 DIAGNOSIS — E860 Dehydration: Secondary | ICD-10-CM | POA: Diagnosis present

## 2013-05-06 DIAGNOSIS — M109 Gout, unspecified: Secondary | ICD-10-CM | POA: Diagnosis present

## 2013-05-06 DIAGNOSIS — Z833 Family history of diabetes mellitus: Secondary | ICD-10-CM

## 2013-05-06 DIAGNOSIS — B029 Zoster without complications: Principal | ICD-10-CM | POA: Diagnosis present

## 2013-05-06 DIAGNOSIS — Z96649 Presence of unspecified artificial hip joint: Secondary | ICD-10-CM

## 2013-05-06 DIAGNOSIS — Z823 Family history of stroke: Secondary | ICD-10-CM

## 2013-05-06 LAB — CBC WITH DIFFERENTIAL/PLATELET
Basophils Absolute: 0 10*3/uL (ref 0.0–0.1)
Basophils Relative: 0 % (ref 0–1)
Eosinophils Absolute: 0 10*3/uL (ref 0.0–0.7)
Eosinophils Relative: 0 % (ref 0–5)
HCT: 40.3 % (ref 39.0–52.0)
Hemoglobin: 13.6 g/dL (ref 13.0–17.0)
Lymphocytes Relative: 6 % — ABNORMAL LOW (ref 12–46)
Lymphs Abs: 0.5 10*3/uL — ABNORMAL LOW (ref 0.7–4.0)
MCH: 33.7 pg (ref 26.0–34.0)
MCHC: 33.7 g/dL (ref 30.0–36.0)
MCV: 99.8 fL (ref 78.0–100.0)
Monocytes Absolute: 0.4 10*3/uL (ref 0.1–1.0)
Monocytes Relative: 6 % (ref 3–12)
Neutro Abs: 6.7 10*3/uL (ref 1.7–7.7)
Neutrophils Relative %: 88 % — ABNORMAL HIGH (ref 43–77)
Platelets: 150 10*3/uL (ref 150–400)
RBC: 4.04 MIL/uL — ABNORMAL LOW (ref 4.22–5.81)
RDW: 15.1 % (ref 11.5–15.5)
WBC: 7.7 10*3/uL (ref 4.0–10.5)

## 2013-05-06 LAB — BASIC METABOLIC PANEL
BUN: 26 mg/dL — ABNORMAL HIGH (ref 6–23)
CO2: 22 mEq/L (ref 19–32)
Calcium: 9.5 mg/dL (ref 8.4–10.5)
Chloride: 103 mEq/L (ref 96–112)
Creatinine, Ser: 1.12 mg/dL (ref 0.50–1.35)
GFR calc Af Amer: 70 mL/min — ABNORMAL LOW (ref >90)
GFR calc non Af Amer: 61 mL/min — ABNORMAL LOW (ref >90)
Glucose, Bld: 221 mg/dL — ABNORMAL HIGH (ref 70–99)
Potassium: 4.7 mEq/L (ref 3.7–5.3)
Sodium: 136 mEq/L — ABNORMAL LOW (ref 137–147)

## 2013-05-06 LAB — TROPONIN I: Troponin I: <0.3 ng/mL (ref <0.30)

## 2013-05-06 MED ORDER — GABAPENTIN 300 MG PO CAPS
300.0000 mg | ORAL_CAPSULE | Freq: Once | ORAL | Status: AC
  Administered 2013-05-06: 300 mg via ORAL
  Filled 2013-05-06: qty 1

## 2013-05-06 MED ORDER — IOHEXOL 300 MG/ML  SOLN
50.0000 mL | Freq: Once | INTRAMUSCULAR | Status: AC | PRN
  Administered 2013-05-06: 50 mL via ORAL

## 2013-05-06 MED ORDER — IOHEXOL 300 MG/ML  SOLN
100.0000 mL | Freq: Once | INTRAMUSCULAR | Status: AC | PRN
  Administered 2013-05-06: 100 mL via INTRAVENOUS

## 2013-05-06 MED ORDER — MORPHINE SULFATE 4 MG/ML IJ SOLN
4.0000 mg | Freq: Once | INTRAMUSCULAR | Status: AC
  Administered 2013-05-06: 4 mg via INTRAVENOUS
  Filled 2013-05-06: qty 1

## 2013-05-06 NOTE — ED Provider Notes (Signed)
CSN: [id number]     Arrival date & time 05/06/13  1827 History  This chart was scribed for Virgel Manifold, MD by Elby Beck, ED Scribe. This patient was seen in room APA08/APA08 and the patient's care was started at 6:55 PM.    Chief Complaint  Patient presents with  . Altered Mental Status    The history is provided by the patient. No language interpreter was used.    HPI Comments: [name] is a 80 y.o. Male with a history of HTN, DM, DVT, TIA, NSTEMI and prostate CA brought in by ambulance and accompanied by a relative to the Emergency Department complaining of intermittent, moderate right thigh and groin pain over the past 2 weeks, associated with a shingles diagnosis. Family also reports that he his "balance has been off" over the past 2 weeks. Relative reports that pt normally able to ambulate with the assistance of a walker until the past 2 weeks. Relative reports that pt is currently taking a second course of Prednisone after his shingles diagnosis, and that pt is not taking any pain medication. Relative also reports that pt had several episodes of emesis 5 days ago, but none since. Pt denies any weakness in his legs, fever or any urinary symptoms. Relative states that pt lives with his wife at home.   Past Medical History  Diagnosis Date  . Hypertension   . Gout     Acute episode involving ankle in 12/2011  . Hyperlipidemia   . Deep vein thrombophlebitis of left leg 11/2010    LLE; S/P THA  . Fasting hyperglycemia   . Stroke 08/2009; 07/2010    TIA in 07/2010  . H/O heat stroke 08/2009  . Prostate cancer 2013    adenocarcinoma; Gleason grade 8; PSA 9.45; EBRT 2013-14  . Renal cyst   . Syncope     01/2012: Negative pharmacologic stress nuclear  . PAF (paroxysmal atrial fibrillation)     a. first noted 01/2012 during admission for URI;  b. seen again 03/2012 after nstemi of unknown origin - pradaxa initiated.  Marland Kitchen CAD (coronary artery disease)     a. 01/2012 Elev  Ti-->nonischemic MV;  b. 03/2012 NSTEMI/Cath: LM 50, LAD 50p/m, 83m/d, LCX nl, RI 60ost sup branch, RCA diff nonobs, EF 55%  . Radiation proctitis 02/2013  . NSTEMI (non-ST elevated myocardial infarction) 02/2013  . Diabetes mellitus without complication    Past Surgical History  Procedure Laterality Date  . Hip arthroplasty  12/08/2010    ARTHROPLASTY BIPOLAR HIP;  Surgeon-Olin; Left  . 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 I&d extremity  05/28/2011    Procedure: IRRIGATION AND DEBRIDEMENT EXTREMITY;  Surgeon: Linna Hoff, MD;  Location: [place];  Service: Orthopedics;  Laterality: Right;  I & D Right hand/wrist  . Colonoscopy   07/18/2003    RMR: Rectum internal hemorrhoids.  Otherwise, normal rectum/ Left-sided diverticula.  Remainder of colonic mucosa-nl  . Flexible sigmoidoscopy N/A 03/18/2013    Procedure: FLEXIBLE SIGMOIDOSCOPY;  Surgeon: Wonda Horner, MD;  Location: [facility] ENDOSCOPY;  Service: Endoscopy;  Laterality: N/A;  . Cardiac catheterization  03/2012    non-obstructive ASCAD, medial management only, pt does not want repeat cath   Family History  Problem Relation Age of Onset  . Prostate cancer Brother   . Colon cancer Sister 44  . Diabetes Mellitus II Father   . Stroke Father  History  Substance Use Topics  . Smoking status: Former Smoker    Types: Cigarettes, Cigars    Quit date: 01/27/1948  . Smokeless tobacco: Current User    Types: Chew     Comment: "quit smoking cigarettes age 43 or 88"  . Alcohol Use: No    Review of Systems  Constitutional: Negative for fever.  Gastrointestinal: Positive for vomiting (resolved).  Genitourinary: Negative for dysuria, urgency, frequency and hematuria.  Musculoskeletal: Positive for gait problem.       Right thigh and groin pain  Neurological: Negative for weakness.  All other systems reviewed and are negative.   Allergies  Tetanus toxoids  Home Medications   Current Outpatient Rx   Name  Route  Sig  Dispense  Refill  . allopurinol (ZYLOPRIM) 300 MG tablet   Oral   Take 300 mg by mouth every morning.          Marland Kitchen ascorbic acid (VITAMIN C) 500 MG tablet   Oral   Take 500 mg by mouth 2 (two) times daily.          . calcium-vitamin D (OSCAL WITH D) 500-200 MG-UNIT per tablet   Oral   Take 1 tablet by mouth every evening.          . COD LIVER OIL PO   Oral   Take 2 tablets by mouth 2 (two) times daily.          Marland Kitchen diltiazem (CARDIZEM CD) 180 MG 24 hr capsule   Oral   Take 180 mg by mouth every morning.          Marland Kitchen doxycycline (VIBRA-TABS) 100 MG tablet   Oral   Take 100 mg by mouth 2 (two) times daily. Start 05/03/13         . Edoxaban Tosylate (SAVAYSA) 60 MG TABS   Oral   Take 1 tablet by mouth daily.         Marland Kitchen glipiZIDE (GLUCOTROL XL) 2.5 MG 24 hr tablet   Oral   Take 2.5 mg by mouth every morning.          . metoprolol (LOPRESSOR) 50 MG tablet   Oral   Take 25 mg by mouth 2 (two) times daily. TAKEHALF TAB IN AM TAKE HALF TAB AT PM         . Multiple Vitamin (MULITIVITAMIN WITH MINERALS) TABS   Oral   Take 1 tablet by mouth every evening.          . mupirocin ointment (BACTROBAN) 2 %   Topical   Apply 1 application topically 3 (three) times daily.         . predniSONE (DELTASONE) 10 MG tablet   Oral   Take 10-20 mg by mouth 2 (two) times daily as needed (Currently taking 1 tablet in the morning and one tablet at bedtime for gout.). Hold this while you are on higher dose prednisone taper         . simvastatin (ZOCOR) 20 MG tablet   Oral   Take 20 mg by mouth every evening.         . sulfamethoxazole-trimethoprim (SEPTRA DS) 800-160 MG per tablet   Oral   Take 1 tablet by mouth 2 (two) times daily.   10 tablet   0   . valACYclovir (VALTREX) 1000 MG tablet   Oral   Take 1 tablet (1,000 mg total) by mouth 3 (three) times daily.   21 tablet   0   .  furosemide (LASIX) 20 MG tablet   Oral   Take 20 mg by mouth  daily as needed for fluid.         . nitroGLYCERIN (NITROSTAT) 0.4 MG SL tablet   Sublingual   Place 0.4 mg under the tongue every 5 (five) minutes as needed for chest pain.         . polyethylene glycol (MIRALAX / GLYCOLAX) packet   Oral   Take 17 g by mouth daily as needed for mild constipation or severe constipation.          Triage Vitals: BP 114/69  Pulse 95  Temp(Src) 98.6 F (37 C) (Oral)  Resp 20  SpO2 97%  Physical Exam  Nursing note and vitals reviewed. Constitutional: He is oriented to person, place, and time. He appears well-developed and well-nourished.  Laying in bed. NAD. Obese.   HENT:  Head: Normocephalic and atraumatic.  Eyes: EOM are normal.  Neck: Normal range of motion.  Cardiovascular: Normal heart sounds and intact distal pulses.   Irregularly irregular  Pulmonary/Chest: Effort normal and breath sounds normal. No respiratory distress.  Abdominal: Soft. He exhibits no distension. There is tenderness.  Mild diffuse tenderness w/o rebound/guarding  Musculoskeletal: Normal range of motion.  Neurological: He is alert and oriented to person, place, and time. No cranial nerve deficit. He exhibits normal muscle tone. Coordination normal.  Speech clear. Content appropriate. Making jokes. CN 2-12 intact aside from [facility]. Strength 5/5 b/l u/l extremities.   Skin: Skin is warm and dry. Rash noted.  Rash, consistent with resolving shingles. Right L2 dermatome.  Psychiatric: He has a normal mood and affect. His behavior is normal. Judgment normal.    ED Course  Procedures (including critical care time)  DIAGNOSTIC STUDIES: Oxygen Saturation is 97% on RA, normal by my interpretation.    COORDINATION OF CARE: 7:03 PM- Discussed plan to order pain medication. Will also obtain diagnostic lab work and radiology. Pt advised of plan for treatment and pt agrees.  Medications  morphine 4 MG/ML injection 4 mg (4 mg Intravenous Given 05/06/13 1935)  gabapentin  (NEURONTIN) capsule 300 mg (300 mg Oral Given 05/06/13 2000)  iohexol (OMNIPAQUE) 300 MG/ML solution 50 mL (50 mLs Oral Contrast Given 05/06/13 1951)  iohexol (OMNIPAQUE) 300 MG/ML solution 100 mL (100 mLs Intravenous Contrast Given 05/06/13 2108)   Labs Review Labs Reviewed  BASIC METABOLIC PANEL - Abnormal; Notable for the following:    Sodium 136 (*)    Glucose, Bld 221 (*)    BUN 26 (*)    GFR calc non Af Amer 61 (*)    GFR calc Af Amer 70 (*)    All other components within normal limits  CBC WITH DIFFERENTIAL - Abnormal; Notable for the following:    RBC 4.04 (*)    Neutrophils Relative % 88 (*)    Lymphocytes Relative 6 (*)    Lymphs Abs 0.5 (*)    All other components within normal limits  TROPONIN I  URINALYSIS, ROUTINE W REFLEX MICROSCOPIC   Imaging Review Dg Chest 1 View  05/06/2013   CLINICAL DATA:  Lethargic, weakness, cough  EXAM: CHEST - 1 VIEW  COMPARISON:  DG CHEST 1 VIEW dated 04/01/2013  FINDINGS: Limited inspiratory effect. Stable mild cardiac enlargement. Vascular pattern normal. Lungs clear.  IMPRESSION: No active disease.   Electronically Signed   By: Skipper Cliche M.D.   On: 05/06/2013 21:42   Ct Abdomen Pelvis W Contrast  05/06/2013   CLINICAL  DATA:  Abdominal pain  EXAM: CT ABDOMEN AND PELVIS WITH CONTRAST  TECHNIQUE: Multidetector CT imaging of the abdomen and pelvis was performed using the standard protocol following bolus administration of intravenous contrast.  CONTRAST:  60mL OMNIPAQUE IOHEXOL 300 MG/ML SOLN, 124mL OMNIPAQUE IOHEXOL 300 MG/ML SOLN  COMPARISON:  04/01/2013  FINDINGS: The lung bases appear clear. No pleural or pericardial effusion identified.  Atherosclerotic disease. There is no liver abnormality identified. The gallbladder appears normal. No biliary dilatation. Normal appearance of the pancreas. The spleen is unremarkable.  Normal appearance of the adrenal glands. Bilateral renal cortical atrophy. There are multiple cysts within both kidneys  compatible with Bosniak category 1 and 2 renal cysts. Multiple bilateral renal calculi are identified. The largest stone is in the inferior pole of the left kidney measuring 5 mm, image 41/series 2. The urinary bladder appears normal. Seed implants are identified within the prostate gland.  Advanced calcified atherosclerotic disease involves the abdominal aorta. No aneurysm. There is no upper abdominal adenopathy identified. No pelvic or inguinal adenopathy noted.  The stomach appears normal. The small bowel loops have a normal course and caliber without evidence for bowel obstruction. The appendix is visualized and appears normal. Normal appearance of the proximal colon. Multiple distal colonic diverticula are identified without acute inflammation.  Previous left hip arthroplasty. There is multi level degenerative disc disease identified within the lumbar spine. Stable L5 compression fracture.  IMPRESSION: 1. No acute findings identified within the abdomen or pelvis. 2. Bilateral nonobstructing renal calculi. 3. Bilateral Bosniak category 1 and 2 renal cysts. 4.   Electronically Signed   By: Kerby Moors M.D.   On: 05/06/2013 21:38     EKG Interpretation None      MDM   Final diagnoses:  Shingles    81yM with continued pain from shingles. Tx'd for what sounds like presumed shingles on previous ED visit. Additionally given bactrim. Suspect clinically picture wasn't completely clear then and being covered for possible cellulitis.  Exam today consistent with resolving shingles. Crusted lesions clearly in R L2 dermatomal pattern. No cellulitis. W/u today has been pretty unremarkable, although UA still pending. Pt with poor mobility. Suspect further hampered by pain. Neurologically intact. Will provide additional pain medications. Will additionally add gabapentin as may potentially getting better relief.    I personally preformed the services scribed in my presence. The recorded information has been  reviewed is accurate. Virgel Manifold, MD.   Virgel Manifold, MD 05/15/13 [phone]

## 2013-05-06 NOTE — ED Notes (Signed)
Pt here from home for ? AMS per EMS. Pt states he is in pain from shingles he has had for two weeks

## 2013-05-06 NOTE — ED Notes (Signed)
Pt alert and oriented to name, month and year and place, grips equal, no facial droop noted

## 2013-05-07 ENCOUNTER — Emergency Department (HOSPITAL_COMMUNITY): Payer: Medicare Other

## 2013-05-07 ENCOUNTER — Encounter (HOSPITAL_COMMUNITY): Payer: Self-pay

## 2013-05-07 DIAGNOSIS — R5381 Other malaise: Secondary | ICD-10-CM

## 2013-05-07 DIAGNOSIS — G9340 Encephalopathy, unspecified: Secondary | ICD-10-CM

## 2013-05-07 DIAGNOSIS — B029 Zoster without complications: Principal | ICD-10-CM

## 2013-05-07 DIAGNOSIS — R5383 Other fatigue: Secondary | ICD-10-CM

## 2013-05-07 DIAGNOSIS — I4891 Unspecified atrial fibrillation: Secondary | ICD-10-CM

## 2013-05-07 DIAGNOSIS — R531 Weakness: Secondary | ICD-10-CM | POA: Diagnosis present

## 2013-05-07 DIAGNOSIS — M109 Gout, unspecified: Secondary | ICD-10-CM

## 2013-05-07 LAB — CREATININE, SERUM
CREATININE: 1.09 mg/dL (ref 0.50–1.35)
GFR calc Af Amer: 73 mL/min — ABNORMAL LOW (ref >90)
GFR, EST NON AFRICAN AMERICAN: 63 mL/min — AB (ref >90)

## 2013-05-07 LAB — URINALYSIS, ROUTINE W REFLEX MICROSCOPIC
Bilirubin Urine: NEGATIVE
Glucose, UA: NEGATIVE mg/dL
Hgb urine dipstick: NEGATIVE
Ketones, ur: NEGATIVE mg/dL
Leukocytes, UA: NEGATIVE
Nitrite: NEGATIVE
Protein, ur: NEGATIVE mg/dL
Specific Gravity, Urine: 1.01 (ref 1.005–1.030)
Urobilinogen, UA: 0.2 mg/dL (ref 0.0–1.0)
pH: 6.5 (ref 5.0–8.0)

## 2013-05-07 LAB — CBC
HEMATOCRIT: 40.7 % (ref 39.0–52.0)
HEMOGLOBIN: 13.6 g/dL (ref 13.0–17.0)
MCH: 33.5 pg (ref 26.0–34.0)
MCHC: 33.4 g/dL (ref 30.0–36.0)
MCV: 100.2 fL — ABNORMAL HIGH (ref 78.0–100.0)
Platelets: 151 10*3/uL (ref 150–400)
RBC: 4.06 MIL/uL — ABNORMAL LOW (ref 4.22–5.81)
RDW: 15.2 % (ref 11.5–15.5)
WBC: 7.8 10*3/uL (ref 4.0–10.5)

## 2013-05-07 LAB — MRSA PCR SCREENING: MRSA BY PCR: POSITIVE — AB

## 2013-05-07 LAB — GLUCOSE, CAPILLARY
GLUCOSE-CAPILLARY: 74 mg/dL (ref 70–99)
GLUCOSE-CAPILLARY: 183 mg/dL — AB (ref 70–99)
Glucose-Capillary: 85 mg/dL (ref 70–99)
Glucose-Capillary: 144 mg/dL — ABNORMAL HIGH (ref 70–99)

## 2013-05-07 MED ORDER — SIMVASTATIN 20 MG PO TABS: 20.0000 mg | ORAL_TABLET | Freq: Every evening | ORAL | Status: DC

## 2013-05-07 MED ORDER — ONDANSETRON HCL 4 MG PO TABS: 4.0000 mg | ORAL_TABLET | Freq: Four times a day (QID) | ORAL | Status: DC | PRN

## 2013-05-07 MED ORDER — ATORVASTATIN CALCIUM 10 MG PO TABS
10.0000 mg | ORAL_TABLET | Freq: Every day | ORAL | Status: DC
  Administered 2013-05-07 – 2013-05-08 (×2): 10 mg via ORAL
  Filled 2013-05-07 (×2): qty 1

## 2013-05-07 MED ORDER — METOPROLOL TARTRATE 25 MG PO TABS
25.0000 mg | ORAL_TABLET | Freq: Two times a day (BID) | ORAL | Status: DC
  Administered 2013-05-07 – 2013-05-08 (×3): 25 mg via ORAL
  Filled 2013-05-07 (×3): qty 1

## 2013-05-07 MED ORDER — DILTIAZEM HCL ER COATED BEADS 180 MG PO CP24
180.0000 mg | ORAL_CAPSULE | Freq: Every morning | ORAL | Status: DC
  Administered 2013-05-07 – 2013-05-08 (×2): 180 mg via ORAL
  Filled 2013-05-07 (×2): qty 1

## 2013-05-07 MED ORDER — PREDNISONE 10 MG PO TABS
10.0000 mg | ORAL_TABLET | Freq: Every day | ORAL | Status: DC
  Administered 2013-05-07 – 2013-05-08 (×2): 10 mg via ORAL
  Filled 2013-05-07 (×2): qty 1

## 2013-05-07 MED ORDER — ONDANSETRON HCL 4 MG/2ML IJ SOLN: 4.0000 mg | Freq: Four times a day (QID) | INTRAMUSCULAR | Status: DC | PRN

## 2013-05-07 MED ORDER — ALLOPURINOL 300 MG PO TABS
300.0000 mg | ORAL_TABLET | Freq: Every morning | ORAL | Status: DC
  Administered 2013-05-07 – 2013-05-08 (×2): 300 mg via ORAL
  Filled 2013-05-07 (×2): qty 1

## 2013-05-07 MED ORDER — CHLORHEXIDINE GLUCONATE CLOTH 2 % EX PADS
6.0000 | MEDICATED_PAD | Freq: Every day | CUTANEOUS | Status: DC
  Administered 2013-05-07 – 2013-05-08 (×2): 6 via TOPICAL

## 2013-05-07 MED ORDER — OXYCODONE-ACETAMINOPHEN 5-325 MG PO TABS: 1.0000 | ORAL_TABLET | ORAL | Status: DC | PRN

## 2013-05-07 MED ORDER — SODIUM CHLORIDE 0.9 % IJ SOLN
3.0000 mL | Freq: Two times a day (BID) | INTRAMUSCULAR | Status: DC
  Administered 2013-05-07 – 2013-05-08 (×3): 3 mL via INTRAVENOUS

## 2013-05-07 MED ORDER — SODIUM CHLORIDE 0.9 % IJ SOLN: 3.0000 mL | INTRAMUSCULAR | Status: DC | PRN

## 2013-05-07 MED ORDER — EDOXABAN TOSYLATE 60 MG PO TABS
60.0000 mg | ORAL_TABLET | Freq: Every day | ORAL | Status: DC
  Administered 2013-05-07: 60 mg via ORAL

## 2013-05-07 MED ORDER — PREDNISONE 10 MG PO TABS: 10.0000 mg | ORAL_TABLET | Freq: Two times a day (BID) | ORAL | Status: DC

## 2013-05-07 MED ORDER — MORPHINE SULFATE 2 MG/ML IJ SOLN
2.0000 mg | INTRAMUSCULAR | Status: DC | PRN
  Administered 2013-05-08 (×2): 2 mg via INTRAVENOUS
  Filled 2013-05-07 (×3): qty 1

## 2013-05-07 MED ORDER — MUPIROCIN 2 % EX OINT
1.0000 "application " | TOPICAL_OINTMENT | Freq: Three times a day (TID) | CUTANEOUS | Status: DC
  Administered 2013-05-07 – 2013-05-08 (×5): 1 via TOPICAL
  Filled 2013-05-07: qty 22

## 2013-05-07 MED ORDER — GABAPENTIN 300 MG PO CAPS: 300.0000 mg | ORAL_CAPSULE | Freq: Three times a day (TID) | ORAL | Status: DC

## 2013-05-07 MED ORDER — INSULIN ASPART 100 UNIT/ML ~~LOC~~ SOLN
0.0000 [IU] | Freq: Three times a day (TID) | SUBCUTANEOUS | Status: DC
  Administered 2013-05-07 – 2013-05-08 (×2): 2 [IU] via SUBCUTANEOUS
  Administered 2013-05-08: 1 [IU] via SUBCUTANEOUS
  Administered 2013-05-08: 2 [IU] via SUBCUTANEOUS

## 2013-05-07 MED ORDER — SODIUM CHLORIDE 0.9 % IV SOLN
250.0000 mL | INTRAVENOUS | Status: DC | PRN
Start: 2013-05-07 — End: 2013-05-08

## 2013-05-07 MED ORDER — DOXYCYCLINE HYCLATE 100 MG PO TABS
100.0000 mg | ORAL_TABLET | Freq: Two times a day (BID) | ORAL | Status: DC
  Administered 2013-05-07 – 2013-05-08 (×3): 100 mg via ORAL
  Filled 2013-05-07 (×3): qty 1

## 2013-05-07 MED ORDER — PREDNISONE 20 MG PO TABS
20.0000 mg | ORAL_TABLET | Freq: Every day | ORAL | Status: DC
  Administered 2013-05-07: 20 mg via ORAL
  Filled 2013-05-07: qty 1

## 2013-05-07 MED ORDER — MUPIROCIN 2 % EX OINT
1.0000 | TOPICAL_OINTMENT | Freq: Two times a day (BID) | CUTANEOUS | Status: DC
Start: 2013-05-07 — End: 2013-05-08
  Administered 2013-05-07 (×2): 1 via NASAL

## 2013-05-07 MED ORDER — VALACYCLOVIR HCL 500 MG PO TABS
1000.0000 mg | ORAL_TABLET | Freq: Three times a day (TID) | ORAL | Status: DC
  Administered 2013-05-07 – 2013-05-08 (×5): 1000 mg via ORAL
  Filled 2013-05-07 (×10): qty 2

## 2013-05-07 NOTE — ED Provider Notes (Addendum)
Patient initially seen and evaluated by Dr. Wilson Singer for weakness and altered mentation. At time of patient care transfer, urinalysis is pending. Urinalysis is come back normal. I went to talk with the patient and family and they stated they were unable to take care of him because of his weakness. They have home health services and are still not able to take care of him. He will be kept in the ED and social service consult will be obtained for possible nursing home placement.  Delora Fuel, MD 96/78/93 [id number]  Social services are not available until April 13. Patient has altered mentation and weakness to range of only made for inpatient evaluation. Case is discussed with Dr. Darrick Meigs of [facility] who agrees to see the patient in the ED.  Delora Fuel, MD 75/10/25 [id number]

## 2013-05-07 NOTE — Progress Notes (Signed)
Patient was admitted by Dr. Darrick Meigs earlier this morning  Patient seen and examined.   Is admitted with generalized weakness, herpes zoster on his left leg. He is in respiratory isolation. Etiology of his generalized weakness is not entirely clear, although may be related to deconditioning. His neurologic exam doesn't show any focal weakness. We'll request a physical therapy consultation tomorrow. His symptoms have been present for greater than one week. May need placement. He is receiving IV fluids for dehydration and mental status is improving. He is no longer lethargic.

## 2013-05-07 NOTE — Discharge Instructions (Signed)
Shingles Shingles is caused by the same virus that causes chickenpox. The first feelings may be pain or tingling. A rash will follow in a couple days. The rash may occur on any area of the body. Long-lasting pain is more likely in an elderly person. It can last months to years. There are medicines that can help prevent pain if you start taking them early. HOME CARE   Place cool cloths on the rash.  Only take medicine as told by your doctor.  You may use calamine lotion to relieve itchy skin.  Avoid touching:  Babies.  Children with inflamed skin (eczema).  People who have gotten transplanted organs.  People with chronic illnesses, such as leukemia and AIDS.  If the rash is on the face, you may need to see a specialist. Keep all appointments. Shingles must be kept away from the eyes, if possible.  Keep all appointments.  Avoid touching the eyes or eye area, if possible. GET HELP RIGHT AWAY IF:   You have any pain on the face or eye.  Your medicines do not help.  Your redness or puffiness (swelling) spreads.  You have a fever.  You notice any red lines going away from the rash area. MAKE SURE YOU:   Understand these instructions.  Will watch your condition.  Will get help right away if you are not doing well or get worse. Document Released: 07/01/2007 Document Revised: 04/06/2011 Document Reviewed: 07/01/2007 [facility] Patient Information 2014 [place], [place].  Neuropathic Pain We often think that pain has a physical cause. If we get rid of the cause, the pain should go away. Nerves themselves can also cause pain. It is called neuropathic pain, which means nerve abnormality. It may be difficult for the patients who have it and for the treating caregivers. Pain is usually described as acute (short-lived) or chronic (long-lasting). Acute pain is related to the physical sensations caused by an injury. It can last from a few seconds to many weeks, but it usually goes away when  normal healing occurs. Chronic pain lasts beyond the typical healing time. With neuropathic pain, the nerve fibers themselves may be damaged or injured. They then send incorrect signals to other pain centers. The pain you feel is real, but the cause is not easy to find.  CAUSES  Chronic pain can result from diseases, such as diabetes and shingles (an infection related to chickenpox), or from trauma, surgery, or amputation. It can also happen without any known injury or disease. The nerves are sending pain messages, even though there is no identifiable cause for such messages.   Other common causes of neuropathy include diabetes, phantom limb pain, or Regional Pain Syndrome (RPS).  As with all forms of chronic back pain, if neuropathy is not correctly treated, there can be a number of associated problems that lead to a downward cycle for the patient. These include depression, sleeplessness, feelings of fear and anxiety, limited social interaction and inability to do normal daily activities or work.  The most dramatic and mysterious example of neuropathic pain is called "phantom limb syndrome." This occurs when an arm or a leg has been removed because of illness or injury. The brain still gets pain messages from the nerves that originally carried impulses from the missing limb. These nerves now seem to misfire and cause troubling pain.  Neuropathic pain often seems to have no cause. It responds poorly to standard pain treatment. Neuropathic pain can occur after:  Shingles (herpes zoster virus infection).  A lasting burning sensation of the skin, caused usually by injury to a peripheral nerve.  Peripheral neuropathy which is widespread nerve damage, often caused by diabetes or alcoholism.  Phantom limb pain following an amputation.  Facial nerve problems (trigeminal neuralgia).  Multiple sclerosis.  Reflex sympathetic dystrophy.  Pain which comes with cancer and cancer  chemotherapy.  Entrapment neuropathy such as when pressure is put on a nerve such as in carpal tunnel syndrome.  Back, leg, and hip problems (sciatica).  Spine or back surgery.  HIV Infection or AIDS where nerves are infected by viruses. Your caregiver can explain items in the above list which may apply to you. SYMPTOMS  Characteristics of neuropathic pain are:  Severe, sharp, electric shock-like, shooting, lightening-like, knife-like.  Pins and needles sensation.  Deep burning, deep cold, or deep ache.  Persistent numbness, tingling, or weakness.  Pain resulting from light touch or other stimulus that would not usually cause pain.  Increased sensitivity to something that would normally cause pain, such as a pinprick. Pain may persist for months or years following the healing of damaged tissues. When this happens, pain signals no longer sound an alarm about current injuries or injuries about to happen. Instead, the alarm system itself is not working correctly.  Neuropathic pain may get worse instead of better over time. For some people, it can lead to serious disability. It is important to be aware that severe injury in a limb can occur without a proper, protective pain response.Burns, cuts, and other injuries may go unnoticed. Without proper treatment, these injuries can become infected or lead to further disability. Take any injury seriously, and consult your caregiver for treatment. DIAGNOSIS  When you have a pain with no known cause, your caregiver will probably ask some specific questions:   Do you have any other conditions, such as diabetes, shingles, multiple sclerosis, or HIV infection?  How would you describe your pain? (Neuropathic pain is often described as shooting, stabbing, burning, or searing.)  Is your pain worse at any time of the day? (Neuropathic pain is usually worse at night.)  Does the pain seem to follow a certain physical pathway?  Does the pain come from  an area that has missing or injured nerves? (An example would be phantom limb pain.)  Is the pain triggered by minor things such as rubbing against the sheets at night? These questions often help define the type of pain involved. Once your caregiver knows what is happening, treatment can begin. Anticonvulsant, antidepressant drugs, and various pain relievers seem to work in some cases. If another condition, such as diabetes is involved, better management of that disorder may relieve the neuropathic pain.  TREATMENT  Neuropathic pain is frequently long-lasting and tends not to respond to treatment with narcotic type pain medication. It may respond well to other drugs such as antiseizure and antidepressant medications. Usually, neuropathic problems do not completely go away, but partial improvement is often possible with proper treatment. Your caregivers have large numbers of medications available to treat you. Do not be discouraged if you do not get immediate relief. Sometimes different medications or a combination of medications will be tried before you receive the results you are hoping for. See your caregiver if you have pain that seems to be coming from nowhere and does not go away. Help is available.  SEEK IMMEDIATE MEDICAL CARE IF:   There is a sudden change in the quality of your pain, especially if the change is on only one  side of the body.  You notice changes of the skin, such as redness, black or purple discoloration, swelling, or an ulcer.  You cannot move the affected limbs. Document Released: 10/10/2003 Document Revised: 04/06/2011 Document Reviewed: 10/10/2003 [facility] Patient Information 2014 [place].  Fatigue Fatigue is a feeling of tiredness, lack of energy, lack of motivation, or feeling tired all the time. Having enough rest, good nutrition, and reducing stress will normally reduce fatigue. Consult your caregiver if it persists. The nature of your fatigue will help your  caregiver to find out its cause. The treatment is based on the cause.  CAUSES  There are many causes for fatigue. Most of the time, fatigue can be traced to one or more of your habits or routines. Most causes fit into one or more of three general areas. They are: Lifestyle problems  Sleep disturbances.  Overwork.  Physical exertion.  Unhealthy habits.  Poor eating habits or eating disorders.  Alcohol and/or drug use .  Lack of proper nutrition (malnutrition). Psychological problems  Stress and/or anxiety problems.  Depression.  Grief.  Boredom. Medical Problems or Conditions  Anemia.  Pregnancy.  Thyroid gland problems.  Recovery from major surgery.  Continuous pain.  Emphysema or asthma that is not well controlled  Allergic conditions.  Diabetes.  Infections (such as mononucleosis).  Obesity.  Sleep disorders, such as sleep apnea.  Heart failure or other heart-related problems.  Cancer.  Kidney disease.  Liver disease.  Effects of certain medicines such as antihistamines, cough and cold remedies, prescription pain medicines, heart and blood pressure medicines, drugs used for treatment of cancer, and some antidepressants. SYMPTOMS  The symptoms of fatigue include:   Lack of energy.  Lack of drive (motivation).  Drowsiness.  Feeling of indifference to the surroundings. DIAGNOSIS  The details of how you feel help guide your caregiver in finding out what is causing the fatigue. You will be asked about your present and past health condition. It is important to review all medicines that you take, including prescription and non-prescription items. A thorough exam will be done. You will be questioned about your feelings, habits, and normal lifestyle. Your caregiver may suggest blood tests, urine tests, or other tests to look for common medical causes of fatigue.  TREATMENT  Fatigue is treated by correcting the underlying cause. For example, if you  have continuous pain or depression, treating these causes will improve how you feel. Similarly, adjusting the dose of certain medicines will help in reducing fatigue.  HOME CARE INSTRUCTIONS   Try to get the required amount of good sleep every night.  Eat a healthy and nutritious diet, and drink enough water throughout the day.  Practice ways of relaxing (including yoga or meditation).  Exercise regularly.  Make plans to change situations that cause stress. Act on those plans so that stresses decrease over time. Keep your work and personal routine reasonable.  Avoid street drugs and minimize use of alcohol.  Start taking a daily multivitamin after consulting your caregiver. SEEK MEDICAL CARE IF:   You have persistent tiredness, which cannot be accounted for.  You have fever.  You have unintentional weight loss.  You have headaches.  You have disturbed sleep throughout the night.  You are feeling sad.  You have constipation.  You have dry skin.  You have gained weight.  You are taking any new or different medicines that you suspect are causing fatigue.  You are unable to sleep at night.  You develop any  unusual swelling of your legs or other parts of your body. SEEK IMMEDIATE MEDICAL CARE IF:   You are feeling confused.  Your vision is blurred.  You feel faint or pass out.  You develop severe headache.  You develop severe abdominal, pelvic, or back pain.  You develop chest pain, shortness of breath, or an irregular or fast heartbeat.  You are unable to pass a normal amount of urine.  You develop abnormal bleeding such as bleeding from the rectum or you vomit blood.  You have thoughts about harming yourself or committing suicide.  You are worried that you might harm someone else. MAKE SURE YOU:   Understand these instructions.  Will watch your condition.  Will get help right away if you are not doing well or get worse. Document Released: 11/09/2006  Document Revised: 04/06/2011 Document Reviewed: 11/09/2006 [facility] Patient Information 2014 [place].

## 2013-05-07 NOTE — Progress Notes (Signed)
Patient converted from Simvastatin 20 mg prescription, to Atorvastatin 10 g prescription, per therapeutic interchange authorized by [facility] due to increase risk of rhabdomyolysis risk with Simvastatin doses greater than 10 mg and patient on diltiazem.  [facility] 05/07/2013  1 PM
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
## 2013-05-08 ENCOUNTER — Telehealth: Payer: Self-pay | Admitting: *Deleted

## 2013-05-08 DIAGNOSIS — B029 Zoster without complications: Secondary | ICD-10-CM | POA: Diagnosis present

## 2013-05-08 DIAGNOSIS — I10 Essential (primary) hypertension: Secondary | ICD-10-CM

## 2013-05-08 DIAGNOSIS — R269 Unspecified abnormalities of gait and mobility: Secondary | ICD-10-CM

## 2013-05-08 LAB — BASIC METABOLIC PANEL
BUN: 29 mg/dL — ABNORMAL HIGH (ref 6–23)
CHLORIDE: 102 meq/L (ref 96–112)
CO2: 23 mEq/L (ref 19–32)
CREATININE: 1.02 mg/dL (ref 0.50–1.35)
Calcium: 9 mg/dL (ref 8.4–10.5)
GFR calc Af Amer: 79 mL/min — ABNORMAL LOW (ref >90)
GFR calc non Af Amer: 68 mL/min — ABNORMAL LOW (ref >90)
Glucose, Bld: 199 mg/dL — ABNORMAL HIGH (ref 70–99)
POTASSIUM: 5 meq/L (ref 3.7–5.3)
Sodium: 136 mEq/L — ABNORMAL LOW (ref 137–147)

## 2013-05-08 LAB — CBC
HCT: 37.7 % — ABNORMAL LOW (ref 39.0–52.0)
Hemoglobin: 12.8 g/dL — ABNORMAL LOW (ref 13.0–17.0)
MCH: 34 pg (ref 26.0–34.0)
MCHC: 34 g/dL (ref 30.0–36.0)
MCV: 100 fL (ref 78.0–100.0)
PLATELETS: 145 10*3/uL — AB (ref 150–400)
RBC: 3.77 MIL/uL — ABNORMAL LOW (ref 4.22–5.81)
RDW: 15.1 % (ref 11.5–15.5)
WBC: 7.4 10*3/uL (ref 4.0–10.5)

## 2013-05-08 LAB — GLUCOSE, CAPILLARY
GLUCOSE-CAPILLARY: 175 mg/dL — AB (ref 70–99)
Glucose-Capillary: 143 mg/dL — ABNORMAL HIGH (ref 70–99)
Glucose-Capillary: 158 mg/dL — ABNORMAL HIGH (ref 70–99)

## 2013-05-08 MED ORDER — OXYCODONE-ACETAMINOPHEN 5-325 MG PO TABS: 1.0000 | ORAL_TABLET | ORAL | Status: DC | PRN

## 2013-05-08 MED ORDER — VALACYCLOVIR HCL 1 G PO TABS: 1000.0000 mg | ORAL_TABLET | Freq: Three times a day (TID) | ORAL | Status: DC

## 2013-05-08 MED ORDER — GABAPENTIN 300 MG PO CAPS: 300.0000 mg | ORAL_CAPSULE | Freq: Three times a day (TID) | ORAL | Status: DC

## 2013-05-08 NOTE — Discharge Summary (Signed)
Patient seen and examined.  Agree with note as above.  He is being treated for shingles on his right thigh.  He is difficulty with his gait, likely related to pain from his shingles and generalized weakness from inactivity.  He does not have any focal deficits.  He has been seen by physical therapy and is recommended for home health RN.  Patient is ready for discharge home.  All other chronic issues are stable.  [employer]

## 2013-05-08 NOTE — Discharge Summary (Signed)
Physician Discharge Summary  [name] [id number] DOB: 1931-08-01 DOA: 05/06/2013  PCP: Leonides Grills, MD  Admit date: 05/06/2013 Discharge date: 05/08/2013  Time spent: 40 minutes  Recommendations for Outpatient Follow-up:  1. Follow up with Cardiology 05/10/13 for evaluation of medication. Patient and family convinced weakness result of Savaysa. He was discharged on same until evaluation by cards 2. Discharged to home with home health for PT for strength and endurance.   3. PCP in 2 weeks for evaluation of rash on right thigh and general strength and endurence.  Discharge Diagnoses:  Principal Problem:   Herpes zoster Active Problems:   Hypertension   Atrial fibrillation   Unsteady gait   Chronic anticoagulation   Weakness   Discharge Condition: stable  Diet recommendation: carb modified  Filed Weights   05/07/13 0509  Weight: 96.752 kg (213 lb 4.8 oz)    History of present illness:  80 year old male who has a past medical history of Hypertension; Gout; Hyperlipidemia; Deep vein thrombophlebitis of left leg (11/2010); Fasting hyperglycemia; Stroke (08/2009; 07/2010); H/O heat stroke (08/2009); Prostate cancer (2013); Renal cyst; Syncope; PAF (paroxysmal atrial fibrillation); CAD (coronary artery disease); Radiation proctitis (02/2013); NSTEMI (non-ST elevated myocardial infarction) (02/2013); and Diabetes mellitus without complication.  brought to the hospital by ambulance on 4/12/145 with brother who provided most of the history  as patient was very somnolent after he received morphine. As per brother, patient had intermittent moderate right thigh and groin pain over the previous 2 weeks and was diagnosed with shingles. Since then patient's balance had been very off and he had not been able to walk on his own . Patient also was given prednisone along with valtrex by his PCP.  He denied fever, no chest pain or shortness of breath. Patient had been complaining of right thigh  pain at time of admission was very somnolent of previously morphine for pain control.  Hospital Course:  Herpes zoster: will continue valtrex for total 7 days. Continue prednisone. Lesions dry. Some pain with ambulation. Will provide pain medicine. Follow up with PCP 1 week for evaluation of resolution.  Weakness: etiology uncertain but may be related to deconditioning. No focal weakness on exam.  Evaluated by PT who did recommend [facility]. On day of discharge ambulated with walker. He does experience pain with ambulation due to #1. Family and patient convinced that weakness started with initiation  of Savaysa. It was explained that savaysa not have these side effects. Chart review indicates patient changed to this medication from Eliquis due to cost. Prior to this he was on pradaxa but this was changed due to GI bleed. Family wanted to go back to pradaxa.   afib with chronic anticoagulation: see #2. Will continue savaysa at discharge as no indication to discontinue. Lengthy discussion with patient and family regarding likelyhood of this being cause of weakness. Family prefers pradaxa but this contraindicated due to recent GI bleed. Pt has appointment with cardiology 05/10/13 to discuss alternative meds.   HTN: controlled during hospitalization.  Procedures:  none  Consultations:  none  Discharge Exam: Filed Vitals:   05/08/13 0853  BP: 126/70  Pulse: 92  Temp:   Resp:     General: well nourished NAD Cardiovascular: irregularly irregular. No MGR No LE edema Respiratory: normal effort BS clear bilaterally no wheeze Skin: right upper thigh with brown dotted rash. No open lesion/drainage.  Neuro: alert and oriented x3. Speech clear. Facial symmetry.  Discharge Instructions You were cared for by a hospitalist  during your hospital stay. If you have any questions about your discharge medications or the care you received while you were in the hospital after you are discharged, you can call  the unit and asked to speak with the hospitalist on call if the hospitalist that took care of you is not available. Once you are discharged, your primary care physician will handle any further medical issues. Please note that NO REFILLS for any discharge medications will be authorized once you are discharged, as it is imperative that you return to your primary care physician (or establish a relationship with a primary care physician if you do not have one) for your aftercare needs so that they can reassess your need for medications and monitor your lab values.       Future Appointments Provider Department Dept Phone   05/10/2013 3:50 PM Lendon Colonel, NP [facility] [place] [phone]   06/01/2013 11:20 AM Herminio Commons, MD [facility] [place] [phone]       Medication List    STOP taking these medications       doxycycline 100 MG tablet  Commonly known as:  VIBRA-TABS     sulfamethoxazole-trimethoprim 800-160 MG per tablet  Commonly known as:  SEPTRA DS      TAKE these medications       allopurinol 300 MG tablet  Commonly known as:  ZYLOPRIM  Take 300 mg by mouth every morning.     ascorbic acid 500 MG tablet  Commonly known as:  VITAMIN C  Take 500 mg by mouth 2 (two) times daily.     calcium-vitamin D 500-200 MG-UNIT per tablet  Commonly known as:  OSCAL WITH D  Take 1 tablet by mouth every evening.     COD LIVER OIL PO  Take 2 tablets by mouth 2 (two) times daily.     diltiazem 180 MG 24 hr capsule  Commonly known as:  CARDIZEM CD  Take 180 mg by mouth every morning.     furosemide 20 MG tablet  Commonly known as:  LASIX  Take 20 mg by mouth daily as needed for fluid.     gabapentin 300 MG capsule  Commonly known as:  NEURONTIN  Take 1 capsule (300 mg total) by mouth 3 (three) times daily.     glipiZIDE 2.5 MG 24 hr tablet  Commonly known as:  GLUCOTROL XL  Take 2.5 mg by mouth every morning.     metoprolol 50 MG tablet  Commonly  known as:  LOPRESSOR  Take 25 mg by mouth 2 (two) times daily. TAKEHALF TAB IN AM TAKE HALF TAB AT PM     multivitamin with minerals Tabs tablet  Take 1 tablet by mouth every evening.     mupirocin ointment 2 %  Commonly known as:  BACTROBAN  Apply 1 application topically 3 (three) times daily.     nitroGLYCERIN 0.4 MG SL tablet  Commonly known as:  NITROSTAT  Place 0.4 mg under the tongue every 5 (five) minutes as needed for chest pain.     oxyCODONE-acetaminophen 5-325 MG per tablet  Commonly known as:  PERCOCET/ROXICET  Take 1-2 tablets by mouth every 4 (four) hours as needed for severe pain.     polyethylene glycol packet  Commonly known as:  MIRALAX / GLYCOLAX  Take 17 g by mouth daily as needed for mild constipation or severe constipation.     predniSONE 10 MG tablet  Commonly known as:  DELTASONE  Take 10-20  mg by mouth 2 (two) times daily as needed (Currently taking 1 tablet in the morning and one tablet at bedtime for gout.). Hold this while you are on higher dose prednisone taper     SAVAYSA 60 MG Tabs  Generic drug:  Edoxaban Tosylate  Take 1 tablet by mouth daily.     simvastatin 20 MG tablet  Commonly known as:  ZOCOR  Take 20 mg by mouth every evening.     valACYclovir 1000 MG tablet  Commonly known as:  VALTREX  Take 1 tablet (1,000 mg total) by mouth 3 (three) times daily.       Allergies  Allergen Reactions  . Tetanus Toxoids Swelling    Also allergic to "high powered pain medications."   Follow-up Information   Schedule an appointment as soon as possible for a visit with Leonides Grills, MD.   Specialty:  Family Medicine   Contact information:   [address] [place] [address] [phone]        The results of significant diagnostics from this hospitalization (including imaging, microbiology, ancillary and laboratory) are listed below for reference.    Significant Diagnostic Studies: Dg Chest 1  View  05/06/2013   CLINICAL DATA:  Lethargic, weakness, cough  EXAM: CHEST - 1 VIEW  COMPARISON:  DG CHEST 1 VIEW dated 04/01/2013  FINDINGS: Limited inspiratory effect. Stable mild cardiac enlargement. Vascular pattern normal. Lungs clear.  IMPRESSION: No active disease.   Electronically Signed   By: Skipper Cliche M.D.   On: 05/06/2013 21:42   Ct Head Wo Contrast  05/07/2013   CLINICAL DATA:  Weakness. Altered mental status. History of hypertension, stroke, prostate cancer, syncope, radiation proctitis, diabetes, coronary artery disease.  EXAM: CT HEAD WITHOUT CONTRAST  TECHNIQUE: Contiguous axial images were obtained from the base of the skull through the vertex without intravenous contrast.  COMPARISON:  03/11/2013  FINDINGS: Diffuse cerebral atrophy. Ventricular dilatation consistent with central atrophy. Patchy low-attenuation changes in the periventricular white matter likely represents small vessel ischemic change. No mass effect or midline shift. No abnormal extra-axial fluid collections. Gray-white matter junctions are distinct. Basal cisterns are not effaced. No evidence of acute intracranial hemorrhage. Vascular calcifications. Retention cysts in the maxillary antra. Opacification of the right mastoid air cells. Since the previous study, the sphenoid opacification has improved.  IMPRESSION: No acute intracranial abnormalities. Chronic atrophy and small vessel ischemic changes.   Electronically Signed   By: Lucienne Capers M.D.   On: 05/07/2013 03:05   Ct Abdomen Pelvis W Contrast  05/06/2013   CLINICAL DATA:  Abdominal pain  EXAM: CT ABDOMEN AND PELVIS WITH CONTRAST  TECHNIQUE: Multidetector CT imaging of the abdomen and pelvis was performed using the standard protocol following bolus administration of intravenous contrast.  CONTRAST:  9mL OMNIPAQUE IOHEXOL 300 MG/ML SOLN, 161mL OMNIPAQUE IOHEXOL 300 MG/ML SOLN  COMPARISON:  04/01/2013  FINDINGS: The lung bases appear clear. No pleural or  pericardial effusion identified.  Atherosclerotic disease. There is no liver abnormality identified. The gallbladder appears normal. No biliary dilatation. Normal appearance of the pancreas. The spleen is unremarkable.  Normal appearance of the adrenal glands. Bilateral renal cortical atrophy. There are multiple cysts within both kidneys compatible with Bosniak category 1 and 2 renal cysts. Multiple bilateral renal calculi are identified. The largest stone is in the inferior pole of the left kidney measuring 5 mm, image 41/series 2. The urinary bladder appears normal. Seed implants are identified within the prostate  gland.  Advanced calcified atherosclerotic disease involves the abdominal aorta. No aneurysm. There is no upper abdominal adenopathy identified. No pelvic or inguinal adenopathy noted.  The stomach appears normal. The small bowel loops have a normal course and caliber without evidence for bowel obstruction. The appendix is visualized and appears normal. Normal appearance of the proximal colon. Multiple distal colonic diverticula are identified without acute inflammation.  Previous left hip arthroplasty. There is multi level degenerative disc disease identified within the lumbar spine. Stable L5 compression fracture.  IMPRESSION: 1. No acute findings identified within the abdomen or pelvis. 2. Bilateral nonobstructing renal calculi. 3. Bilateral Bosniak category 1 and 2 renal cysts. 4.   Electronically Signed   By: Kerby Moors M.D.   On: 05/06/2013 21:38    Microbiology: Recent Results (from the past 240 hour(s))  MRSA PCR SCREENING     Status: Abnormal   Collection Time    05/07/13  5:00 AM      Result Value Ref Range Status   MRSA by PCR POSITIVE (*) NEGATIVE Final   Comment:            The GeneXpert MRSA Assay (FDA     approved for NASAL specimens     only), is one component of a     comprehensive MRSA colonization     surveillance program. It is not     intended to diagnose MRSA      infection nor to guide or     monitor treatment for     MRSA infections.     RESULT CALLED TO, READ BACK BY AND VERIFIED WITH:     SURLES,M AT 1017 ON 05/07/13 BY GODFREY,O     Labs: Basic Metabolic Panel:  Recent Labs Lab 05/06/13 1939 05/07/13 0650 05/08/13 0549  NA 136*  --  136*  K 4.7  --  5.0  CL 103  --  102  CO2 22  --  23  GLUCOSE 221*  --  199*  BUN 26*  --  29*  CREATININE 1.12 1.09 1.02  CALCIUM 9.5  --  9.0   Liver Function Tests: No results found for this basename: AST, ALT, ALKPHOS, BILITOT, PROT, ALBUMIN,  in the last 168 hours No results found for this basename: LIPASE, AMYLASE,  in the last 168 hours No results found for this basename: AMMONIA,  in the last 168 hours CBC:  Recent Labs Lab 05/06/13 1939 05/07/13 0650 05/08/13 0549  WBC 7.7 7.8 7.4  NEUTROABS 6.7  --   --   HGB 13.6 13.6 12.8*  HCT 40.3 40.7 37.7*  MCV 99.8 100.2* 100.0  PLT 150 151 145*   Cardiac Enzymes:  Recent Labs Lab 05/06/13 1939  TROPONINI <0.30   BNP: BNP (last 3 results) No results found for this basename: PROBNP,  in the last 8760 hours CBG:  Recent Labs Lab 05/07/13 1142 05/07/13 1653 05/07/13 2151 05/08/13 0838 05/08/13 1217  GLUCAP 74 144* 183* 175* 143*       Signed:  Lezlie Octave Black  [facility] 05/08/2013, 2:16 PM

## 2013-05-08 NOTE — Progress Notes (Signed)
Discussed patients refusal of edoxoban with Dr. Roderic Palau and Dyanne Carrel FNP. Patient wants prodaxa back. Physician aware.

## 2013-05-08 NOTE — Evaluation (Signed)
Physical Therapy Evaluation Patient Details Name: [name] MRN: [id number] DOB: July 09, 1931 Today's Date: 05/08/2013   History of Present Illness  Pt was admitted to the hospital with shingles and the sudden inability to walk.  He states that on 05-03-13 he developed a sudden onset of LE weakness and could not walk at all.  Normally, he is able to walk short distances in the home with a walker (from one room to another).  In the community, he uses a w/c.  He and wife have a 24 hour CG who assist with all ADLs as well as care for the home and cooking .  He is obese with a hx of CVA, gout, DVT and NSTEMI..  Clinical Impression  Pt was seen for evaluation.  He is fully oriented today and was very cooperative.  Pain continues to be a major problem for him.  He received morphine about 1 hour prior to my visit and had intense intermittent groin pain throughout the eval.  I alerted RN to this.  Pt is upset that he isn't able to take normal size steps:  "I have to take baby steps".  His mobility and transfer ability is baseline.  He was able to ambulate at least 6' with a walker.  Standing stability with a walker was WNL.  His preference is to go home at d/c with HHPT.  Since he has a full time CG and a full array of DME, I think that this would be fine.  However, if he changes his mind, he would be appropriate for SNF.  Hopefully, his ambulatory endurance will improve daily, although he is not highly ambulatory under the best of circumstances.    Follow Up Recommendations Home health PT    Equipment Recommendations  None recommended by PT    Recommendations for Other Services   none    Precautions / Restrictions Precautions Precautions: Fall Restrictions Weight Bearing Restrictions: No      Mobility  Bed Mobility Overal bed mobility: Needs Assistance Bed Mobility: Supine to Sit     Supine to sit: Supervision;HOB elevated     General bed mobility comments: pt sleeps in a recliner at  home  Transfers Overall transfer level: Needs assistance Equipment used: Rolling walker (2 wheeled) Transfers: Sit to/from Stand Sit to Stand: Supervision         General transfer comment: standing stability is WNL with a walker  Ambulation/Gait Ambulation/Gait assistance: Supervision Ambulation Distance (Feet): 6 Feet Assistive device: Rolling walker (2 wheeled) Gait Pattern/deviations: Trunk flexed;Decreased stride length   Gait velocity interpretation: Below normal speed for age/gender General Gait Details: pt probably could have walked a bit further but I did not feel that I could safely manage him without backup assistance.  Stairs  N/A                     Balance Overall balance assessment: Needs assistance Sitting-balance support: No upper extremity supported;Feet supported Sitting balance-Leahy Scale: Good     Standing balance support: Bilateral upper extremity supported Standing balance-Leahy Scale: Good                                    Home Living Family/patient expects to be discharged to:: Private residence Living Arrangements: Spouse/significant other Available Help at Discharge: Family;Personal care attendant;Available 24 hours/day Type of Home: House Home Access: Ramped entrance     Home Layout:  One level Home Equipment: [place] - 2 wheels;Cane - single point;Bedside commode;Wheelchair - manual;Tub bench      Prior Function Level of Independence: Needs assistance   Gait / Transfers Assistance Needed: pt ambulates short distances with a walker, has a hx of falls...able to transfer independently  ADL's / Homemaking Assistance Needed: assist with bathing and dressing                Extremity/Trunk Assessment               Lower Extremity Assessment: Overall WFL for tasks assessed (MMT strength= 4/5)      Cervical / Trunk Assessment: Kyphotic  Communication   Communication: HOH  Cognition  Arousal/Alertness: Awake/alert Behavior During Therapy: WFL for tasks assessed/performed Overall Cognitive Status: Within Functional Limits for tasks assessed                                    Assessment/Plan    PT Assessment Patient needs continued PT services  PT Diagnosis Difficulty walking;Acute pain   PT Problem List Decreased activity tolerance;Decreased mobility;Pain;Obesity  PT Treatment Interventions Gait training;Functional mobility training;Therapeutic exercise   PT Goals (Current goals can be found in the Care Plan section) Acute Rehab PT Goals Patient Stated Goal: wnat to be able to walk independently in the home PT Goal Formulation: With patient Time For Goal Achievement: 05/22/13 Potential to Achieve Goals: Fair    Frequency Min 3X/week   Barriers to discharge   none                   End of Session Equipment Utilized During Treatment: Gait belt Activity Tolerance: Patient limited by fatigue;Patient limited by pain Patient left: in chair;with call bell/phone within reach Nurse Communication: Mobility status         Time: 9767-3419 PT Time Calculation (min): 61 min   Charges:   PT Evaluation $Initial PT Evaluation Tier I: 1 Procedure     PT G CodesSable Feil 05/08/2013, 10:53 AM

## 2013-05-08 NOTE — Telephone Encounter (Signed)
Pt niece is calling just to give [place] a update on him. He started taking new medication for blood thinner. He is hospitalized at the moment loss the ability to walk and function. Not sure if it related to this new medication but wanted [place] to be aware.

## 2013-05-08 NOTE — Telephone Encounter (Signed)
FYI: Pt's niece called and wanted to make [place] aware that since pt started 61, he could not lift his foot. Now pt is in hospital because of Lower extremity weakness. This nurse advised this pt that this is not a side effect to this drug. Pt's niece is determined that it is.

## 2013-05-08 NOTE — Progress Notes (Signed)
Utilization Review Complete  

## 2013-05-08 NOTE — Care Management Note (Signed)
    Page 1 of 1   05/08/2013     3:05:54 PM   CARE MANAGEMENT NOTE 05/08/2013  Patient:  [name], [name]   Account Number:  [id number]  Date Initiated:  05/08/2013  Documentation initiated by:  Claretha Cooper  Subjective/Objective Assessment:   Pt hard of hearing. CSW spoke with brother who indicated pt would like to DC home. PT suggested HH PT. Brother told CM the pt previously used [facility] and would like to use them again.     Action/Plan:   Anticipated DC Date:  05/08/2013   Anticipated DC Plan:  [place]  In-house referral  Clinical Social Worker      DC Planning Services  CM consult      Choice offered to / List presented to:          [facility] arranged  [facility] PT      [place].   Status of service:  Completed, signed off Medicare Important Message given?  NA - LOS <3 / Initial given by admissions (If response is "NO", the following Medicare IM given date fields will be blank) Date Medicare IM given:   Date Additional Medicare IM given:    Discharge Disposition:  [place]  Per UR Regulation:    If discussed at Long Length of Stay Meetings, dates discussed:    Comments:  05/08/13 Claretha Cooper RN BSN CM

## 2013-05-08 NOTE — Telephone Encounter (Signed)
Thank you for the information.

## 2013-05-08 NOTE — Clinical Social Work Psychosocial (Signed)
Clinical Social Work Department BRIEF PSYCHOSOCIAL ASSESSMENT 05/08/2013  Patient:  LADALE, SHERBURN     Account Number:  [id number]     Admit date:  05/06/2013  Clinical Social Worker:  Wyatt Haste  Date/Time:  05/08/2013 11:23 AM  Referred by:  Physician  Date Referred:  05/08/2013 Referred for  SNF Placement   Other Referral:   Interview type:  Patient Other interview type:   brother- Iona Beard    PSYCHOSOCIAL DATA Living Status:  WIFE Admitted from facility:   Level of care:   Primary support name:  Iona Beard Primary support relationship to patient:  [place] Degree of support available:   supportive    CURRENT CONCERNS Current Concerns  Post-Acute Placement   Other Concerns:    SOCIAL WORK ASSESSMENT / PLAN CSW met with pt at bedside. Pt alert and oriented, but hard of hearing. Completed some of assessment with him and then called his brother, Iona Beard. Pt reports he lives with his wife and they have 24 hours a day care in the home. Pt came to ED over weekend and reportedly family said he could not return home because they could not manage him. PT assessed pt today and pt states he is returning home and is open to home health which PT felt was appropriate. CSW called his brother who states that the reason they could not manage him at home was an old hospital bed. He indicates that pt was doing fine at home until he started taking a blood thinner and he feels that is what has caused pt's problems. He wants to discuss this with MD. [facility] and had just stopped the week before because he was doing so well. Brother requests that this be resumed when he is d/c. CM notified.   Assessment/plan status:  Referral to [employer] Other assessment/ plan:   Information/referral to community resources:   CM for home health/equipment needs    [name]/FAMILY'S RESPONSE TO PLAN OF CARE: Pt requests to return home at d/c. Brother aware and agreeable. Pt has  around the clock care at home. CSW will sign off, but can be reconsulted if needed.       Benay Pike, [place]

## 2013-05-08 NOTE — Progress Notes (Signed)
Patient assisted back to bed. One person assist. Tolerated well.

## 2013-05-10 ENCOUNTER — Encounter: Payer: Self-pay | Admitting: Adult Health

## 2013-05-10 ENCOUNTER — Ambulatory Visit (INDEPENDENT_AMBULATORY_CARE_PROVIDER_SITE_OTHER): Payer: Medicare Other | Admitting: Adult Health

## 2013-05-10 VITALS — BP 142/81 | HR 88 | Wt 217.0 lb

## 2013-05-10 DIAGNOSIS — I2510 Atherosclerotic heart disease of native coronary artery without angina pectoris: Secondary | ICD-10-CM

## 2013-05-10 DIAGNOSIS — I4891 Unspecified atrial fibrillation: Secondary | ICD-10-CM

## 2013-05-10 DIAGNOSIS — I10 Essential (primary) hypertension: Secondary | ICD-10-CM

## 2013-05-10 MED ORDER — SIMVASTATIN 20 MG PO TABS: 20.0000 mg | ORAL_TABLET | Freq: Every evening | ORAL | Status: AC

## 2013-05-10 NOTE — Progress Notes (Signed)
HPI: [name] is a 80 year old patient to be est with  Dr. Bronson Ing or Harl Bowie,  for follow for ongoing assessment and management of atrial fibrillation, CAD, and hypertension.       The patient had a catheterization in March of 2014 demonstrated nonobstructive CAD with EF of 55%. He was recently discharged from any PET hospital after complaining of weakness in his leg, he was convinced it is related to [facility] a new NOAC.      But was diagnosed with herpes zoster of the leg. He was discharged home with home health and physical therapy for strength and endurance. He is here for ongoing followup posthospitalization.   He comes today angry, cantankerous, and argumentative. He has multiple complaints. He is convinced that [facility]  is causing him to have his overall generalized fatigue and weakness in his leg. He refuses to believe that shingles has caused this to occur. He is accompanied by his daughter and a Dietitian.   They wish me to go over his antiviral meds and his pain medications. I have advised him that they will need to discuss this with the primary care physician.               Allergies  Allergen Reactions  . Tetanus Toxoids Swelling    Also allergic to "high powered pain medications."    Current Outpatient Prescriptions  Medication Sig Dispense Refill  . allopurinol (ZYLOPRIM) 300 MG tablet Take 300 mg by mouth every morning.       Marland Kitchen ascorbic acid (VITAMIN C) 500 MG tablet Take 500 mg by mouth 2 (two) times daily.       . calcium-vitamin D (OSCAL WITH D) 500-200 MG-UNIT per tablet Take 1 tablet by mouth every evening.       . COD LIVER OIL PO Take 2 tablets by mouth 2 (two) times daily.       Marland Kitchen diltiazem (CARDIZEM CD) 180 MG 24 hr capsule Take 180 mg by mouth every morning.       . Edoxaban Tosylate (SAVAYSA) 60 MG TABS Take 1 tablet by mouth daily.      . furosemide (LASIX) 20 MG tablet Take 20 mg by mouth daily as needed for fluid.      Marland Kitchen gabapentin (NEURONTIN)  300 MG capsule Take 1 capsule (300 mg total) by mouth 3 (three) times daily.  30 capsule  0  . glipiZIDE (GLUCOTROL XL) 2.5 MG 24 hr tablet Take 2.5 mg by mouth every morning.       . metoprolol (LOPRESSOR) 50 MG tablet Take 25 mg by mouth 2 (two) times daily. TAKEHALF TAB IN AM TAKE HALF TAB AT PM      . Multiple Vitamin (MULITIVITAMIN WITH MINERALS) TABS Take 1 tablet by mouth every evening.       . mupirocin ointment (BACTROBAN) 2 % Apply 1 application topically 3 (three) times daily.      . nitroGLYCERIN (NITROSTAT) 0.4 MG SL tablet Place 0.4 mg under the tongue every 5 (five) minutes as needed for chest pain.      Marland Kitchen oxyCODONE-acetaminophen (PERCOCET/ROXICET) 5-325 MG per tablet Take 1-2 tablets by mouth every 4 (four) hours as needed for severe pain.  20 tablet  0  . polyethylene glycol (MIRALAX / GLYCOLAX) packet Take 17 g by mouth daily as needed for mild constipation or severe constipation.      . predniSONE (DELTASONE) 10 MG tablet Take 10-20 mg by mouth 2 (two) times  daily as needed (Currently taking 1 tablet in the morning and one tablet at bedtime for gout.). Hold this while you are on higher dose prednisone taper      . simvastatin (ZOCOR) 20 MG tablet Take 20 mg by mouth every evening.      . valACYclovir (VALTREX) 1000 MG tablet Take 1 tablet (1,000 mg total) by mouth 3 (three) times daily.  12 tablet  0   No current facility-administered medications for this visit.    Past Medical History  Diagnosis Date  . Hypertension   . Gout     Acute episode involving ankle in 12/2011  . Hyperlipidemia   . Deep vein thrombophlebitis of left leg 11/2010    LLE; S/P THA  . Fasting hyperglycemia   . Stroke 08/2009; 07/2010    TIA in 07/2010  . H/O heat stroke 08/2009  . Prostate cancer 2013    adenocarcinoma; Gleason grade 8; PSA 9.45; EBRT 2013-14  . Renal cyst   . Syncope     01/2012: Negative pharmacologic stress nuclear  . PAF (paroxysmal atrial fibrillation)     a. first noted  01/2012 during admission for URI;  b. seen again 03/2012 after nstemi of unknown origin - pradaxa initiated.  Marland Kitchen CAD (coronary artery disease)     a. 01/2012 Elev Ti-->nonischemic MV;  b. 03/2012 NSTEMI/Cath: LM 50, LAD 50p/m, 65m/d, LCX nl, RI 60ost sup branch, RCA diff nonobs, EF 55%  . Radiation proctitis 02/2013  . NSTEMI (non-ST elevated myocardial infarction) 02/2013  . Diabetes mellitus without complication     Past Surgical History  Procedure Laterality Date  . Hip arthroplasty  12/08/2010    ARTHROPLASTY BIPOLAR HIP;  Surgeon-Olin; Left  . 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 I&d extremity  05/28/2011    Procedure: IRRIGATION AND DEBRIDEMENT EXTREMITY;  Surgeon: Linna Hoff, MD;  Location: [place];  Service: Orthopedics;  Laterality: Right;  I & D Right hand/wrist  . Colonoscopy   07/18/2003    RMR: Rectum internal hemorrhoids.  Otherwise, normal rectum/ Left-sided diverticula.  Remainder of colonic mucosa-nl  . Flexible sigmoidoscopy N/A 03/18/2013    Procedure: FLEXIBLE SIGMOIDOSCOPY;  Surgeon: Wonda Horner, MD;  Location: [facility];  Service: Endoscopy;  Laterality: N/A;  . Cardiac catheterization  03/2012    non-obstructive ASCAD, medial management only, pt does not want repeat cath    Review of systems complete and found to be negative unless listed above PHYSICAL EXAM BP 142/81  Pulse 88  Wt 217 lb (98.431 kg) General: Well developed, well nourished, in no acute distress Head: Eyes PERRLA, No xanthomas.   Normal cephalic and atramatic  Lungs: Clear bilaterally to auscultation and percussion. Heart: HRIR S1 S2, without MRG.  Pulses are 2+ & equal.            No carotid bruit. No JVD.  No abdominal bruits. No femoral bruits. Abdomen: Bowel sounds are positive, abdomen soft and non-tender without masses or                  Hernia's noted. Msk:  Back normal, normal gait. Normal strength and tone for age. Extremities: No clubbing, cyanosis  or edema.  DP +1 Neuro: Alert and oriented X 3. Very hard of hearing. Psych:  Argumentative, angry.    ASSESSMENT AND PLAN

## 2013-05-10 NOTE — Assessment & Plan Note (Signed)
Currently well-controlled. We will not make any changes at this time. No followup labs are planned.

## 2013-05-10 NOTE — Patient Instructions (Signed)
Your physician wants you to follow-up in: 3-4 months You will receive a reminder letter in the mail two months in advance. If you don't receive a letter, please call our office to schedule the follow-up appointment.     Your physician recommends that you continue on your current medications as directed. Please refer to the Current Medication list given to you today.      Thank you for choosing [facility] !        

## 2013-05-10 NOTE — Assessment & Plan Note (Signed)
He denies any chest pain. But was very minute, and not forthcoming on any other symptoms other than his pain in his leg from the shingles. I will continue him on his current medication regimen and we followed in 3 months unless symptomatic. He will need to be est. with a local cardiologist, and will be seen either by Dr. Harl Bowie for Dr. Pennelope Bracken.

## 2013-05-10 NOTE — Assessment & Plan Note (Signed)
Heart rate is well-controlled currently. I have suggested to that we take him off the sedation for his own peace of mind that this is something that he is convinced causes his shingles outbreak. His family members do not wish to make any medication changes. He was on apixaban 5 mg twice a day, but was unhappy with the cost of this medication which is why he was changed to [facility]. His family members wish to keep him on his current medication regimen. He will be followed up with again in 3 months unless symptomatic.

## 2013-05-10 NOTE — Addendum Note (Signed)
Addended by: Barbarann Ehlers A on: 05/10/2013 05:47 PM   Modules accepted: Orders

## 2013-05-10 NOTE — Progress Notes (Deleted)
Name: [name]    DOB: [birth year]  Age: 80 y.o.  MR#: [id number]       PCP:  Leonides Grills, MD      Insurance: Payor: MEDICARE / Plan: MEDICARE PART A AND B / Product Type: *No Product type* /   CC:    Chief Complaint  Patient presents with  . Hypertension  . Atrial Fibrillation    VS Filed Vitals:   05/10/13 1553  BP: 142/81  Pulse: 88  Weight: 217 lb (98.431 kg)    Weights Current Weight  05/10/13 217 lb (98.431 kg)  05/08/13 214 lb 12.8 oz (97.433 kg)  04/01/13 225 lb (102.059 kg)    Blood Pressure  BP Readings from Last 3 Encounters:  05/10/13 142/81  05/08/13 110/67  04/29/13 116/71     Admit date:  (Not on file) Last encounter with RMR:  04/06/2013   Allergy Tetanus toxoids  Current Outpatient Prescriptions  Medication Sig Dispense Refill  . allopurinol (ZYLOPRIM) 300 MG tablet Take 300 mg by mouth every morning.       Marland Kitchen ascorbic acid (VITAMIN C) 500 MG tablet Take 500 mg by mouth 2 (two) times daily.       . calcium-vitamin D (OSCAL WITH D) 500-200 MG-UNIT per tablet Take 1 tablet by mouth every evening.       . COD LIVER OIL PO Take 2 tablets by mouth 2 (two) times daily.       Marland Kitchen diltiazem (CARDIZEM CD) 180 MG 24 hr capsule Take 180 mg by mouth every morning.       . Edoxaban Tosylate (SAVAYSA) 60 MG TABS Take 1 tablet by mouth daily.      . furosemide (LASIX) 20 MG tablet Take 20 mg by mouth daily as needed for fluid.      Marland Kitchen gabapentin (NEURONTIN) 300 MG capsule Take 1 capsule (300 mg total) by mouth 3 (three) times daily.  30 capsule  0  . glipiZIDE (GLUCOTROL XL) 2.5 MG 24 hr tablet Take 2.5 mg by mouth every morning.       . metoprolol (LOPRESSOR) 50 MG tablet Take 25 mg by mouth 2 (two) times daily. TAKEHALF TAB IN AM TAKE HALF TAB AT PM      . Multiple Vitamin (MULITIVITAMIN WITH MINERALS) TABS Take 1 tablet by mouth every evening.       . mupirocin ointment (BACTROBAN) 2 % Apply 1 application topically 3 (three) times daily.      .  nitroGLYCERIN (NITROSTAT) 0.4 MG SL tablet Place 0.4 mg under the tongue every 5 (five) minutes as needed for chest pain.      Marland Kitchen oxyCODONE-acetaminophen (PERCOCET/ROXICET) 5-325 MG per tablet Take 1-2 tablets by mouth every 4 (four) hours as needed for severe pain.  20 tablet  0  . polyethylene glycol (MIRALAX / GLYCOLAX) packet Take 17 g by mouth daily as needed for mild constipation or severe constipation.      . predniSONE (DELTASONE) 10 MG tablet Take 10-20 mg by mouth 2 (two) times daily as needed (Currently taking 1 tablet in the morning and one tablet at bedtime for gout.). Hold this while you are on higher dose prednisone taper      . simvastatin (ZOCOR) 20 MG tablet Take 20 mg by mouth every evening.      . valACYclovir (VALTREX) 1000 MG tablet Take 1 tablet (1,000 mg total) by mouth 3 (three) times daily.  12 tablet  0   No current  facility-administered medications for this visit.    Discontinued Meds:   There are no discontinued medications.  Patient Active Problem List   Diagnosis Date Noted  . Herpes zoster 05/08/2013  . Weakness 05/07/2013  . Acute kidney injury 12/02/2012  . Gout attack 11/30/2012  . Lethargy 11/28/2012  . Unsteady gait 11/20/2012  . Acute encephalopathy 11/20/2012  . Generalized weakness 11/20/2012  . Chronic anticoagulation 11/20/2012  . Thrombocytopenia, unspecified 11/20/2012  . Acute gouty arthritis 04/12/2012  . Adrenal insufficiency 04/12/2012  . CAD (coronary artery disease) 04/10/2012  . Non-STEMI (non-ST elevated myocardial infarction) 04/09/2012  . Chest pain at rest 04/08/2012  . Hypertension   . Gout   . Hyperlipidemia   . Fasting hyperglycemia   . Cerebrovascular event   . Syncope   . Prostate cancer   . Atrial fibrillation 01/27/2012  . Deep vein thrombophlebitis of left leg 11/27/2010    LABS    Component Value Date/Time   NA 136* 05/08/2013 0549   NA 136* 05/06/2013 1939   NA 137 04/01/2013 1607   NA 147 11/20/2011 1036   K  5.0 05/08/2013 0549   K 4.7 05/06/2013 1939   K 4.5 04/01/2013 1607   K 4.5 11/20/2011 1036   CL 102 05/08/2013 0549   CL 103 05/06/2013 1939   CL 101 04/01/2013 1607   CL 109 11/20/2011 1036   CO2 23 05/08/2013 0549   CO2 22 05/06/2013 1939   CO2 23 04/01/2013 1607   CO2 25 11/20/2011 1036   GLUCOSE 199* 05/08/2013 0549   GLUCOSE 221* 05/06/2013 1939   GLUCOSE 223* 04/01/2013 1607   BUN 29* 05/08/2013 0549   BUN 26* 05/06/2013 1939   BUN 25* 04/01/2013 1607   BUN 11 11/20/2011 1036   CREATININE 1.02 05/08/2013 0549   CREATININE 1.09 05/07/2013 0650   CREATININE 1.12 05/06/2013 1939   CREATININE 1.26 12/16/2012 1409   CREATININE 0.98 12/01/2011 1552   CREATININE 1.10 11/20/2011 1036   CALCIUM 9.0 05/08/2013 0549   CALCIUM 9.5 05/06/2013 1939   CALCIUM 9.6 04/01/2013 1607   CALCIUM 10.4 11/20/2011 1036   GFRNONAA 68* 05/08/2013 0549   GFRNONAA 63* 05/07/2013 0650   GFRNONAA 61* 05/06/2013 1939   GFRAA 79* 05/08/2013 0549   GFRAA 73* 05/07/2013 0650   GFRAA 70* 05/06/2013 1939   CMP     Component Value Date/Time   NA 136* 05/08/2013 0549   NA 147 11/20/2011 1036   K 5.0 05/08/2013 0549   K 4.5 11/20/2011 1036   CL 102 05/08/2013 0549   CL 109 11/20/2011 1036   CO2 23 05/08/2013 0549   CO2 25 11/20/2011 1036   GLUCOSE 199* 05/08/2013 0549   BUN 29* 05/08/2013 0549   BUN 11 11/20/2011 1036   CREATININE 1.02 05/08/2013 0549   CREATININE 1.26 12/16/2012 1409   CALCIUM 9.0 05/08/2013 0549   CALCIUM 10.4 11/20/2011 1036   PROT 6.4 04/01/2013 1607   PROT 6.2 11/20/2011 1036   ALBUMIN 3.0* 04/01/2013 1607   AST 24 04/01/2013 1607   AST 25 11/20/2011 1036   ALT 30 04/01/2013 1607   ALKPHOS 75 04/01/2013 1607   ALKPHOS 67 11/20/2011 1036   BILITOT 0.3 04/01/2013 1607   BILITOT 0.6 11/20/2011 1036   GFRNONAA 68* 05/08/2013 0549   GFRAA 79* 05/08/2013 0549       Component Value Date/Time   WBC 7.4 05/08/2013 0549   WBC 7.8 05/07/2013 0650   WBC 7.7 05/06/2013 1939  HGB 12.8* 05/08/2013 0549   HGB 13.6 05/07/2013  0650   HGB 13.6 05/06/2013 1939   HCT 37.7* 05/08/2013 0549   HCT 40.7 05/07/2013 0650   HCT 40.3 05/06/2013 1939   HCT 43 11/20/2011 1039   MCV 100.0 05/08/2013 0549   MCV 100.2* 05/07/2013 0650   MCV 99.8 05/06/2013 1939    Lipid Panel     Component Value Date/Time   CHOL 175 08/12/2010 0544   TRIG 161* 08/12/2010 0544   HDL 32* 08/12/2010 0544   CHOLHDL 5.5 08/12/2010 0544   VLDL 32 08/12/2010 0544   LDLCALC 111* 08/12/2010 0544    ABG    Component Value Date/Time   PHART 7.465* 11/20/2012 1515   PCO2ART 35.9 11/20/2012 1515   PO2ART 54.9* 11/20/2012 1515   HCO3 26.6* 11/27/2012 1923   TCO2 28 11/27/2012 1923   O2SAT 42.0 11/27/2012 1923     Lab Results  Component Value Date   TSH 1.347 11/20/2012   BNP (last 3 results) No results found for this basename: PROBNP,  in the last 8760 hours Cardiac Panel (last 3 results) No results found for this basename: CKTOTAL, CKMB, TROPONINI, RELINDX,  in the last 72 hours  Iron/TIBC/Ferritin No results found for this basename: iron, tibc, ferritin     EKG Orders placed during the hospital encounter of 05/06/13  . EKG     Prior Assessment and Plan Problem List as of 05/10/2013     Cardiovascular and Mediastinum   Hypertension   Last Assessment & Plan   12/16/2012 Office Visit Written 12/16/2012  1:42 PM by Lendon Colonel, NP     Blood pressure is well controlled currently. He is taking his time with position change. He states he is not dizzy or lightheaded. He has not fallen since discharge.    Deep vein thrombophlebitis of left leg   Last Assessment & Plan   03/15/2012 Office Visit Written 03/16/2012 12:17 PM by Yehuda Savannah, MD     DVT occurred postoperatively following total hip arthroplasty. Adequate course of anticoagulation has been completed. No further treatment required for this problem.    Cerebrovascular event   Syncope   Last Assessment & Plan   04/06/2013 Office Visit Written 04/06/2013  2:29 PM by Lendon Colonel, NP     Thought to be related to medication interactions. He has had no further complaints or symptoms.     Atrial fibrillation   Last Assessment & Plan   04/06/2013 Office Visit Written 04/06/2013  2:28 PM by Lendon Colonel, NP     Heart rate is currently controlled. It was rechecked in the exam room and found to be 78 beats per minute. He is requesting an alternative to Eliquis this is become too expensive for him. I have given him samples of Savaysa 60 mg to take daily. I have reviewed his Cr Cl at 83.14. He will finish his Eliquis Rx as he still has two weeks on this, and then begin the new medication. It is at a cost of $4 a month. This is explained to him and his family. He is given a 30 day free Rx as well which will need to have Rx written for him on next visit should he tolerate this medication. He will see Dr. Bronson Ing in 1 month.    Non-STEMI (non-ST elevated myocardial infarction)   Last Assessment & Plan   04/18/2012 Office Visit Written 04/18/2012  3:36 PM by Lendon Colonel, NP  Cardiac catheterization completed during recent hospitalization on 04/12/2012 demonstrated nonobstructive CAD. Continue on ACE inhibitor, beta blocker, and aspirin. No further cardiac testing will be completed at this time. His ejection fraction was found to be normal at 60% per echocardiogram.    CAD (coronary artery disease)   Last Assessment & Plan   04/06/2013 Office Visit Written 04/06/2013  2:28 PM by Lendon Colonel, NP     Denies chest pain or DOE. States he is walking around the house and feeling stronger everyday.       Endocrine   Fasting hyperglycemia   Last Assessment & Plan   03/15/2012 Office Visit Edited 03/17/2012  8:46 PM by Yehuda Savannah, MD     FBGs obtained on an outpatient basis over the past few months range from 97-118. While measurement of a hemoglobin A1c level would be reassuring, it does not appear that pharmacologic therapy is warranted at present.     Adrenal insufficiency     Nervous and Auditory   Acute encephalopathy     Genitourinary   Prostate cancer   Last Assessment & Plan   03/15/2012 Office Visit Written 03/16/2012 12:24 PM by Yehuda Savannah, MD     Currently receiving course of radiation therapy    Acute kidney injury     Other   Gout   Last Assessment & Plan   04/18/2012 Office Visit Written 04/18/2012  3:35 PM by Lendon Colonel, NP     He is being followed by primary care and as having an appointment tomorrow concerning his underlying gout symptoms.    Hyperlipidemia   Last Assessment & Plan   03/15/2012 Office Visit Written 03/16/2012 12:23 PM by Yehuda Savannah, MD     No recent assessment of lipids. Values in 2012 were somewhat suboptimal, but adequate in the absence of known coronary disease.  A repeat assessment would be useful and will be obtained.    Chest pain at rest   Acute gouty arthritis   Unsteady gait   Last Assessment & Plan   12/16/2012 Office Visit Written 12/16/2012  1:44 PM by Lendon Colonel, NP     Working with PT at home. Getting stronger each day. Now using walker.    Generalized weakness   Chronic anticoagulation   Thrombocytopenia, unspecified   Lethargy   Gout attack   Weakness   Herpes zoster       Imaging: Dg Chest 1 View  05/06/2013   CLINICAL DATA:  Lethargic, weakness, cough  EXAM: CHEST - 1 VIEW  COMPARISON:  DG CHEST 1 VIEW dated 04/01/2013  FINDINGS: Limited inspiratory effect. Stable mild cardiac enlargement. Vascular pattern normal. Lungs clear.  IMPRESSION: No active disease.   Electronically Signed   By: Skipper Cliche M.D.   On: 05/06/2013 21:42   Ct Head Wo Contrast  05/07/2013   CLINICAL DATA:  Weakness. Altered mental status. History of hypertension, stroke, prostate cancer, syncope, radiation proctitis, diabetes, coronary artery disease.  EXAM: CT HEAD WITHOUT CONTRAST  TECHNIQUE: Contiguous axial images were obtained from the base of the skull through the  vertex without intravenous contrast.  COMPARISON:  03/11/2013  FINDINGS: Diffuse cerebral atrophy. Ventricular dilatation consistent with central atrophy. Patchy low-attenuation changes in the periventricular white matter likely represents small vessel ischemic change. No mass effect or midline shift. No abnormal extra-axial fluid collections. Gray-white matter junctions are distinct. Basal cisterns are not effaced. No evidence of acute intracranial hemorrhage. Vascular calcifications. Retention cysts in  the maxillary antra. Opacification of the right mastoid air cells. Since the previous study, the sphenoid opacification has improved.  IMPRESSION: No acute intracranial abnormalities. Chronic atrophy and small vessel ischemic changes.   Electronically Signed   By: Lucienne Capers M.D.   On: 05/07/2013 03:05   Ct Abdomen Pelvis W Contrast  05/06/2013   CLINICAL DATA:  Abdominal pain  EXAM: CT ABDOMEN AND PELVIS WITH CONTRAST  TECHNIQUE: Multidetector CT imaging of the abdomen and pelvis was performed using the standard protocol following bolus administration of intravenous contrast.  CONTRAST:  47mL OMNIPAQUE IOHEXOL 300 MG/ML SOLN, 146mL OMNIPAQUE IOHEXOL 300 MG/ML SOLN  COMPARISON:  04/01/2013  FINDINGS: The lung bases appear clear. No pleural or pericardial effusion identified.  Atherosclerotic disease. There is no liver abnormality identified. The gallbladder appears normal. No biliary dilatation. Normal appearance of the pancreas. The spleen is unremarkable.  Normal appearance of the adrenal glands. Bilateral renal cortical atrophy. There are multiple cysts within both kidneys compatible with Bosniak category 1 and 2 renal cysts. Multiple bilateral renal calculi are identified. The largest stone is in the inferior pole of the left kidney measuring 5 mm, image 41/series 2. The urinary bladder appears normal. Seed implants are identified within the prostate gland.  Advanced calcified atherosclerotic disease  involves the abdominal aorta. No aneurysm. There is no upper abdominal adenopathy identified. No pelvic or inguinal adenopathy noted.  The stomach appears normal. The small bowel loops have a normal course and caliber without evidence for bowel obstruction. The appendix is visualized and appears normal. Normal appearance of the proximal colon. Multiple distal colonic diverticula are identified without acute inflammation.  Previous left hip arthroplasty. There is multi level degenerative disc disease identified within the lumbar spine. Stable L5 compression fracture.  IMPRESSION: 1. No acute findings identified within the abdomen or pelvis. 2. Bilateral nonobstructing renal calculi. 3. Bilateral Bosniak category 1 and 2 renal cysts. 4.   Electronically Signed   By: Kerby Moors M.D.   On: 05/06/2013 21:38

## 2013-05-16 ENCOUNTER — Telehealth: Payer: Self-pay | Admitting: *Deleted

## 2013-05-16 MED ORDER — EDOXABAN TOSYLATE 60 MG PO TABS: 1.0000 | ORAL_TABLET | Freq: Every day | ORAL | Status: DC

## 2013-05-16 NOTE — Telephone Encounter (Signed)
Andy from [employer] called and states that [name] is not covered. Eliquis , Pradaxa, Xarelto are. Please advise

## 2013-05-16 NOTE — Telephone Encounter (Signed)
On review of Medicaid Part D. It is found that Xarelto is $40 and Eliquis is not listed as covered. Therefore for cost assistance, recommend Xarelto 20 mg daily. Attempts to contact his niece have been unsuccessful.

## 2013-05-16 NOTE — Telephone Encounter (Signed)
Medication sent via escribe.  

## 2013-05-16 NOTE — Telephone Encounter (Signed)
[facility]  Pt was seen last week and needs his new Rx called in for blood thinner. They checked with pharmacy today and still have not received it.

## 2013-05-16 NOTE — Telephone Encounter (Signed)
Would restart him on Eliquis as he was taking before. Tried to call niece, Scherry Ran, but was unable to leave a voice mail. Due to Medicare Part D confusion of coverage, he can return to original anticoagulant. If he does not wish to do so, he will need to discuss this with primary cardiologist on follow up and accept the risks of no anticoagulation therapy.

## 2013-05-17 ENCOUNTER — Telehealth: Payer: Self-pay | Admitting: *Deleted

## 2013-05-17 NOTE — Telephone Encounter (Signed)
Faxed form on savaysa in box

## 2013-05-18 ENCOUNTER — Telehealth: Payer: Self-pay

## 2013-05-18 ENCOUNTER — Telehealth: Payer: Self-pay | Admitting: *Deleted

## 2013-05-18 ENCOUNTER — Other Ambulatory Visit: Payer: Self-pay | Admitting: *Deleted

## 2013-05-18 ENCOUNTER — Telehealth: Payer: Self-pay | Admitting: Adult Health

## 2013-05-18 MED ORDER — RIVAROXABAN 20 MG PO TABS: 20.0000 mg | ORAL_TABLET | Freq: Every day | ORAL | Status: AC

## 2013-05-18 NOTE — Telephone Encounter (Signed)
[employer] [phone] Notice of approval  Xarelto 20 mg tab quanity 30 per 30 days 05/18/13 through 05/19/14  Member ID [id number],OIBBC Number [id number]  [name] is aware as is pt's niece [name]

## 2013-05-18 NOTE — Telephone Encounter (Signed)
Called Prior auth for Xarelto 20 mg. Office should be contacted within 24 hours for approval. Ref # is [id number]

## 2013-05-18 NOTE — Telephone Encounter (Signed)
Prior authorization / please see refill bin / tgs

## 2013-05-18 NOTE — Telephone Encounter (Signed)
Pt's niece will call me back later today. She will call Jonni Sanger at [facility] today to see which medication would be cheaper eliquis or Xarelto.

## 2013-05-18 NOTE — Telephone Encounter (Signed)
Pt's niece called back stating the pt has had some rectal bleeding. She states that she spoke to the pharmacy and Xarelto will be the only medication covered. Sent Xarelto 20 mg to pharmacy per pt's niece. Pt is going to see his GI Dr hopefully this week.

## 2013-05-18 NOTE — Telephone Encounter (Signed)
Pt is wanting to switch back to eliquis the cost for the savaysa is more. The cost was the only reason they were switching.

## 2013-05-18 NOTE — Telephone Encounter (Signed)
Pt's care taker Threasa Beards called wanting to know about pt they got a letter in the mail from Dr. Gala Romney 11/2012 stating it is time to schedule a ov. Per Threasa Beards pt never got results from his TCS. Pt had a TCS done by another doctor in 02/2013 look in chart under procedures. Pt was a in patient for rectal bleed at [facility] so they went ahead and did a colonoscopy on him. Pt is having rectal bleeding today per Threasa Beards it was a lot it was not mixed with anything pt did not have a BM the blood just came out, per [facility] pt has no visible hemorrhoids, pt is on blood thinners, [facility] Dr. Bronson Ing put pt on blood thinners. Threasa Beards is worried about pt having rectal bleeding being he is on blood thinners also. I made Melanie aware that I will send Dr. Roseanne Kaufman nurse a message and let her know Threasa Beards understood. Please advise [phone].

## 2013-05-19 ENCOUNTER — Encounter (HOSPITAL_COMMUNITY): Payer: Self-pay | Admitting: Emergency Medicine

## 2013-05-19 ENCOUNTER — Inpatient Hospital Stay (HOSPITAL_COMMUNITY)
Admission: EM | Admit: 2013-05-19 | Discharge: 2013-06-06 | DRG: 377 | Disposition: A | Discharge status: Skilled Nursing Facility | Payer: Medicare Other | Attending: Internal Medicine | Admitting: Internal Medicine

## 2013-05-19 ENCOUNTER — Emergency Department (HOSPITAL_COMMUNITY): Payer: Medicare Other

## 2013-05-19 DIAGNOSIS — R2681 Unsteadiness on feet: Secondary | ICD-10-CM

## 2013-05-19 DIAGNOSIS — I2510 Atherosclerotic heart disease of native coronary artery without angina pectoris: Secondary | ICD-10-CM

## 2013-05-19 DIAGNOSIS — I214 Non-ST elevation (NSTEMI) myocardial infarction: Secondary | ICD-10-CM

## 2013-05-19 DIAGNOSIS — I252 Old myocardial infarction: Secondary | ICD-10-CM

## 2013-05-19 DIAGNOSIS — E1149 Type 2 diabetes mellitus with other diabetic neurological complication: Secondary | ICD-10-CM | POA: Diagnosis present

## 2013-05-19 DIAGNOSIS — Z800 Family history of malignant neoplasm of digestive organs: Secondary | ICD-10-CM

## 2013-05-19 DIAGNOSIS — R509 Fever, unspecified: Secondary | ICD-10-CM

## 2013-05-19 DIAGNOSIS — F05 Delirium due to known physiological condition: Secondary | ICD-10-CM

## 2013-05-19 DIAGNOSIS — R5381 Other malaise: Secondary | ICD-10-CM

## 2013-05-19 DIAGNOSIS — K921 Melena: Principal | ICD-10-CM | POA: Diagnosis present

## 2013-05-19 DIAGNOSIS — I4891 Unspecified atrial fibrillation: Secondary | ICD-10-CM | POA: Diagnosis present

## 2013-05-19 DIAGNOSIS — Z96649 Presence of unspecified artificial hip joint: Secondary | ICD-10-CM

## 2013-05-19 DIAGNOSIS — K922 Gastrointestinal hemorrhage, unspecified: Secondary | ICD-10-CM | POA: Diagnosis present

## 2013-05-19 DIAGNOSIS — I480 Paroxysmal atrial fibrillation: Secondary | ICD-10-CM | POA: Clinically undetermined

## 2013-05-19 DIAGNOSIS — Z8042 Family history of malignant neoplasm of prostate: Secondary | ICD-10-CM

## 2013-05-19 DIAGNOSIS — E2740 Unspecified adrenocortical insufficiency: Secondary | ICD-10-CM

## 2013-05-19 DIAGNOSIS — N179 Acute kidney failure, unspecified: Secondary | ICD-10-CM

## 2013-05-19 DIAGNOSIS — E785 Hyperlipidemia, unspecified: Secondary | ICD-10-CM | POA: Diagnosis present

## 2013-05-19 DIAGNOSIS — G9340 Encephalopathy, unspecified: Secondary | ICD-10-CM

## 2013-05-19 DIAGNOSIS — D696 Thrombocytopenia, unspecified: Secondary | ICD-10-CM

## 2013-05-19 DIAGNOSIS — IMO0002 Reserved for concepts with insufficient information to code with codable children: Secondary | ICD-10-CM | POA: Diagnosis present

## 2013-05-19 DIAGNOSIS — Y842 Radiological procedure and radiotherapy as the cause of abnormal reaction of the patient, or of later complication, without mention of misadventure at the time of the procedure: Secondary | ICD-10-CM | POA: Diagnosis present

## 2013-05-19 DIAGNOSIS — Z8619 Personal history of other infectious and parasitic diseases: Secondary | ICD-10-CM

## 2013-05-19 DIAGNOSIS — E876 Hypokalemia: Secondary | ICD-10-CM | POA: Diagnosis not present

## 2013-05-19 DIAGNOSIS — R55 Syncope and collapse: Secondary | ICD-10-CM

## 2013-05-19 DIAGNOSIS — Z87891 Personal history of nicotine dependence: Secondary | ICD-10-CM

## 2013-05-19 DIAGNOSIS — Z66 Do not resuscitate: Secondary | ICD-10-CM | POA: Diagnosis present

## 2013-05-19 DIAGNOSIS — Z823 Family history of stroke: Secondary | ICD-10-CM

## 2013-05-19 DIAGNOSIS — Z947 Corneal transplant status: Secondary | ICD-10-CM

## 2013-05-19 DIAGNOSIS — B029 Zoster without complications: Secondary | ICD-10-CM

## 2013-05-19 DIAGNOSIS — E1142 Type 2 diabetes mellitus with diabetic polyneuropathy: Secondary | ICD-10-CM | POA: Diagnosis present

## 2013-05-19 DIAGNOSIS — Z7901 Long term (current) use of anticoagulants: Secondary | ICD-10-CM

## 2013-05-19 DIAGNOSIS — C61 Malignant neoplasm of prostate: Secondary | ICD-10-CM | POA: Diagnosis present

## 2013-05-19 DIAGNOSIS — I80202 Phlebitis and thrombophlebitis of unspecified deep vessels of left lower extremity: Secondary | ICD-10-CM | POA: Diagnosis present

## 2013-05-19 DIAGNOSIS — I6350 Cerebral infarction due to unspecified occlusion or stenosis of unspecified cerebral artery: Secondary | ICD-10-CM | POA: Diagnosis not present

## 2013-05-19 DIAGNOSIS — R5383 Other fatigue: Secondary | ICD-10-CM

## 2013-05-19 DIAGNOSIS — R7301 Impaired fasting glucose: Secondary | ICD-10-CM

## 2013-05-19 DIAGNOSIS — R079 Chest pain, unspecified: Secondary | ICD-10-CM

## 2013-05-19 DIAGNOSIS — R531 Weakness: Secondary | ICD-10-CM

## 2013-05-19 DIAGNOSIS — Z8546 Personal history of malignant neoplasm of prostate: Secondary | ICD-10-CM

## 2013-05-19 DIAGNOSIS — K6289 Other specified diseases of anus and rectum: Secondary | ICD-10-CM | POA: Diagnosis present

## 2013-05-19 DIAGNOSIS — Z833 Family history of diabetes mellitus: Secondary | ICD-10-CM

## 2013-05-19 DIAGNOSIS — Z8673 Personal history of transient ischemic attack (TIA), and cerebral infarction without residual deficits: Secondary | ICD-10-CM

## 2013-05-19 DIAGNOSIS — I700 Atherosclerosis of aorta: Secondary | ICD-10-CM | POA: Diagnosis present

## 2013-05-19 DIAGNOSIS — I10 Essential (primary) hypertension: Secondary | ICD-10-CM | POA: Diagnosis present

## 2013-05-19 DIAGNOSIS — M109 Gout, unspecified: Secondary | ICD-10-CM | POA: Diagnosis present

## 2013-05-19 DIAGNOSIS — B0229 Other postherpetic nervous system involvement: Secondary | ICD-10-CM

## 2013-05-19 DIAGNOSIS — Z86718 Personal history of other venous thrombosis and embolism: Secondary | ICD-10-CM

## 2013-05-19 LAB — URINALYSIS, ROUTINE W REFLEX MICROSCOPIC
Bilirubin Urine: NEGATIVE
GLUCOSE, UA: 250 mg/dL — AB
Hgb urine dipstick: NEGATIVE
KETONES UR: NEGATIVE mg/dL
LEUKOCYTES UA: NEGATIVE
Nitrite: NEGATIVE
PH: 6 (ref 5.0–8.0)
Protein, ur: NEGATIVE mg/dL
SPECIFIC GRAVITY, URINE: 1.02 (ref 1.005–1.030)
Urobilinogen, UA: 0.2 mg/dL (ref 0.0–1.0)

## 2013-05-19 LAB — CBC WITH DIFFERENTIAL/PLATELET
Basophils Absolute: 0 10*3/uL (ref 0.0–0.1)
Basophils Relative: 0 % (ref 0–1)
EOS PCT: 0 % (ref 0–5)
Eosinophils Absolute: 0 10*3/uL (ref 0.0–0.7)
HEMATOCRIT: 38.4 % — AB (ref 39.0–52.0)
Hemoglobin: 13 g/dL (ref 13.0–17.0)
LYMPHS ABS: 0.5 10*3/uL — AB (ref 0.7–4.0)
LYMPHS PCT: 6 % — AB (ref 12–46)
MCH: 34.5 pg — ABNORMAL HIGH (ref 26.0–34.0)
MCHC: 33.9 g/dL (ref 30.0–36.0)
MCV: 101.9 fL — AB (ref 78.0–100.0)
Monocytes Absolute: 0.6 10*3/uL (ref 0.1–1.0)
Monocytes Relative: 7 % (ref 3–12)
NEUTROS ABS: 7.4 10*3/uL (ref 1.7–7.7)
Neutrophils Relative %: 87 % — ABNORMAL HIGH (ref 43–77)
PLATELETS: 151 10*3/uL (ref 150–400)
RBC: 3.77 MIL/uL — AB (ref 4.22–5.81)
RDW: 16.8 % — ABNORMAL HIGH (ref 11.5–15.5)
WBC: 8.5 10*3/uL (ref 4.0–10.5)

## 2013-05-19 LAB — COMPREHENSIVE METABOLIC PANEL
ALT: 35 U/L (ref 0–53)
AST: 25 U/L (ref 0–37)
Albumin: 2.8 g/dL — ABNORMAL LOW (ref 3.5–5.2)
Alkaline Phosphatase: 53 U/L (ref 39–117)
BUN: 25 mg/dL — ABNORMAL HIGH (ref 6–23)
CALCIUM: 10.3 mg/dL (ref 8.4–10.5)
CHLORIDE: 99 meq/L (ref 96–112)
CO2: 19 meq/L (ref 19–32)
Creatinine, Ser: 0.99 mg/dL (ref 0.50–1.35)
GFR, EST AFRICAN AMERICAN: 88 mL/min — AB (ref >90)
GFR, EST NON AFRICAN AMERICAN: 76 mL/min — AB (ref >90)
GLUCOSE: 259 mg/dL — AB (ref 70–99)
Potassium: 4.8 mEq/L (ref 3.7–5.3)
SODIUM: 134 meq/L — AB (ref 137–147)
Total Bilirubin: 0.3 mg/dL (ref 0.3–1.2)
Total Protein: 5.5 g/dL — ABNORMAL LOW (ref 6.0–8.3)

## 2013-05-19 LAB — TYPE AND SCREEN
ABO/RH(D): A NEG
Antibody Screen: NEGATIVE

## 2013-05-19 LAB — PROTIME-INR
INR: 1.4 (ref 0.00–1.49)
Prothrombin Time: 16.8 seconds — ABNORMAL HIGH (ref 11.6–15.2)

## 2013-05-19 LAB — MRSA PCR SCREENING: MRSA by PCR: NEGATIVE

## 2013-05-19 LAB — POC OCCULT BLOOD, ED: Fecal Occult Bld: POSITIVE — AB

## 2013-05-19 LAB — ABO/RH: ABO/RH(D): A NEG

## 2013-05-19 MED ORDER — SODIUM CHLORIDE 0.9 % IJ SOLN: 3.0000 mL | INTRAMUSCULAR | Status: DC | PRN

## 2013-05-19 MED ORDER — POLYETHYLENE GLYCOL 3350 17 G PO PACK: 17.0000 g | PACK | Freq: Every day | ORAL | Status: DC | PRN

## 2013-05-19 MED ORDER — ADULT MULTIVITAMIN W/MINERALS CH
1.0000 | ORAL_TABLET | Freq: Every evening | ORAL | Status: DC
  Administered 2013-05-19 – 2013-06-04 (×14): 1 via ORAL
  Filled 2013-05-19 (×19): qty 1

## 2013-05-19 MED ORDER — SODIUM CHLORIDE 0.9 % IJ SOLN
3.0000 mL | Freq: Two times a day (BID) | INTRAMUSCULAR | Status: DC
  Administered 2013-05-20 – 2013-05-31 (×20): 3 mL via INTRAVENOUS

## 2013-05-19 MED ORDER — PREDNISONE 10 MG PO TABS
10.0000 mg | ORAL_TABLET | Freq: Two times a day (BID) | ORAL | Status: DC | PRN
  Administered 2013-05-28: 20 mg via ORAL
  Filled 2013-05-19: qty 2

## 2013-05-19 MED ORDER — SODIUM CHLORIDE 0.9 % IV BOLUS (SEPSIS)
1000.0000 mL | Freq: Once | INTRAVENOUS | Status: AC
  Administered 2013-05-19: 1000 mL via INTRAVENOUS

## 2013-05-19 MED ORDER — GABAPENTIN 300 MG PO CAPS
300.0000 mg | ORAL_CAPSULE | Freq: Three times a day (TID) | ORAL | Status: DC
  Administered 2013-05-19 – 2013-05-20 (×2): 300 mg via ORAL
  Filled 2013-05-19 (×4): qty 1

## 2013-05-19 MED ORDER — HYDROMORPHONE HCL PF 1 MG/ML IJ SOLN
1.0000 mg | Freq: Once | INTRAMUSCULAR | Status: AC
  Administered 2013-05-19: 1 mg via INTRAVENOUS
  Filled 2013-05-19: qty 1

## 2013-05-19 MED ORDER — DILTIAZEM HCL ER COATED BEADS 180 MG PO CP24
180.0000 mg | ORAL_CAPSULE | Freq: Every morning | ORAL | Status: DC
  Administered 2013-05-20 – 2013-06-06 (×17): 180 mg via ORAL
  Filled 2013-05-19 (×18): qty 1

## 2013-05-19 MED ORDER — FUROSEMIDE 20 MG PO TABS
20.0000 mg | ORAL_TABLET | Freq: Every day | ORAL | Status: DC | PRN
  Administered 2013-05-21: 20 mg via ORAL
  Filled 2013-05-19: qty 1

## 2013-05-19 MED ORDER — NITROGLYCERIN 0.4 MG SL SUBL: 0.4000 mg | SUBLINGUAL_TABLET | SUBLINGUAL | Status: DC | PRN

## 2013-05-19 MED ORDER — SIMVASTATIN 20 MG PO TABS
20.0000 mg | ORAL_TABLET | Freq: Every evening | ORAL | Status: DC
  Administered 2013-05-19: 20 mg via ORAL
  Filled 2013-05-19 (×3): qty 1

## 2013-05-19 MED ORDER — HYDROMORPHONE HCL PF 1 MG/ML IJ SOLN
1.0000 mg | INTRAMUSCULAR | Status: DC | PRN
  Administered 2013-05-19 – 2013-05-20 (×3): 1 mg via INTRAVENOUS
  Filled 2013-05-19 (×3): qty 1

## 2013-05-19 MED ORDER — ONDANSETRON HCL 4 MG/2ML IJ SOLN
4.0000 mg | Freq: Once | INTRAMUSCULAR | Status: AC
  Administered 2013-05-19: 4 mg via INTRAVENOUS
  Filled 2013-05-19: qty 2

## 2013-05-19 MED ORDER — METOPROLOL TARTRATE 25 MG PO TABS
25.0000 mg | ORAL_TABLET | Freq: Two times a day (BID) | ORAL | Status: DC
  Administered 2013-05-19 – 2013-06-06 (×31): 25 mg via ORAL
  Filled 2013-05-19 (×43): qty 1

## 2013-05-19 MED ORDER — CALCIUM CARBONATE-VITAMIN D 500-200 MG-UNIT PO TABS
1.0000 | ORAL_TABLET | Freq: Every evening | ORAL | Status: DC
  Administered 2013-05-19 – 2013-06-04 (×14): 1 via ORAL
  Filled 2013-05-19 (×20): qty 1

## 2013-05-19 MED ORDER — SODIUM CHLORIDE 0.9 % IV SOLN
INTRAVENOUS | Status: AC
  Administered 2013-05-19: 18:00:00 via INTRAVENOUS

## 2013-05-19 MED ORDER — VALACYCLOVIR HCL 500 MG PO TABS
500.0000 mg | ORAL_TABLET | Freq: Two times a day (BID) | ORAL | Status: DC
  Administered 2013-05-19 – 2013-05-20 (×2): 500 mg via ORAL
  Filled 2013-05-19 (×3): qty 1

## 2013-05-19 MED ORDER — GLIPIZIDE ER 2.5 MG PO TB24
2.5000 mg | ORAL_TABLET | Freq: Every day | ORAL | Status: DC
  Administered 2013-05-20 – 2013-05-31 (×12): 2.5 mg via ORAL
  Filled 2013-05-19 (×15): qty 1

## 2013-05-19 MED ORDER — HYDROCODONE-ACETAMINOPHEN 10-325 MG PO TABS
1.0000 | ORAL_TABLET | Freq: Four times a day (QID) | ORAL | Status: DC | PRN
  Administered 2013-05-19 – 2013-05-28 (×11): 1 via ORAL
  Filled 2013-05-19 (×11): qty 1

## 2013-05-19 MED ORDER — INSULIN ASPART 100 UNIT/ML ~~LOC~~ SOLN
0.0000 [IU] | Freq: Three times a day (TID) | SUBCUTANEOUS | Status: DC
  Administered 2013-05-21 – 2013-05-27 (×4): 2 [IU] via SUBCUTANEOUS
  Administered 2013-05-28: 11 [IU] via SUBCUTANEOUS
  Administered 2013-05-28: 8 [IU] via SUBCUTANEOUS
  Administered 2013-05-29 (×2): 2 [IU] via SUBCUTANEOUS
  Administered 2013-05-31 – 2013-06-01 (×2): 3 [IU] via SUBCUTANEOUS

## 2013-05-19 MED ORDER — SODIUM CHLORIDE 0.9 % IV SOLN
250.0000 mL | INTRAVENOUS | Status: DC | PRN
  Administered 2013-05-31: 250 mL via INTRAVENOUS

## 2013-05-19 MED ORDER — MUPIROCIN 2 % EX OINT
1.0000 "application " | TOPICAL_OINTMENT | Freq: Three times a day (TID) | CUTANEOUS | Status: DC
  Administered 2013-05-19 – 2013-05-25 (×19): 1 via TOPICAL
  Filled 2013-05-19 (×2): qty 22

## 2013-05-19 MED ORDER — ALLOPURINOL 300 MG PO TABS
300.0000 mg | ORAL_TABLET | Freq: Every morning | ORAL | Status: DC
  Administered 2013-05-20 – 2013-06-06 (×17): 300 mg via ORAL
  Filled 2013-05-19 (×19): qty 1

## 2013-05-19 MED ORDER — POLYETHYLENE GLYCOL 3350 17 G PO PACK
17.0000 g | PACK | Freq: Two times a day (BID) | ORAL | Status: DC
  Administered 2013-05-19 – 2013-06-06 (×24): 17 g via ORAL
  Filled 2013-05-19 (×42): qty 1

## 2013-05-19 NOTE — Consult Note (Signed)
[facility] CONSULT Reason for consult: G.I. bleeding Referring Physician: [facility]. PCP: Dr. Orson Ape. G.I.: Dr Wendall Mola is an 80 y.o. male.  HPI: patient has a history of prostate cancer and has undergone previous radiation therapy. This was external beam radiation and extended 2013/14. He was admitted 2/15 with rectal bleeding and underwent sigmoidoscopy by Dr Penelope Coop. This was done in unprepped bowel. There were no abnormalities other than radiation proctitis. The patient has a history of atrial fibrillation and has had previous strokes as well as DDTs and his legs. He said coronary disease and NSTEMI in the past. For these reasons he has been on various anticoagulants Eliquis in the past and most recently Xarelto. He did have a Xarelto yesterday. He recently has been bothered with shingles in spite of having had the shingles vaccine. He is having constipation has been taking Miralax at home but is not really had regular bowel movements although the bowel movements he has had to have been soft. Yesterday he began to pass bright red blood and was admitted to the hospital. Hemoglobin 13 he has had no preceding melena and is not having abdominal pain and bleeding is been described as bright red of the significant amount around his bowel movements.  Past Medical History  Diagnosis Date  . Hypertension   . Gout     Acute episode involving ankle in 12/2011  . Hyperlipidemia   . Deep vein thrombophlebitis of left leg 11/2010    LLE; S/P THA  . Fasting hyperglycemia   . Stroke 08/2009; 07/2010    TIA in 07/2010  . H/O heat stroke 08/2009  . Prostate cancer 2013    adenocarcinoma; Gleason grade 8; PSA 9.45; EBRT 2013-14  . Renal cyst   . Syncope     01/2012: Negative pharmacologic stress nuclear  . PAF (paroxysmal atrial fibrillation)     a. first noted 01/2012 during admission for URI;  b. seen again 03/2012 after nstemi of unknown origin - pradaxa initiated.  Marland Kitchen CAD  (coronary artery disease)     a. 01/2012 Elev Ti-->nonischemic MV;  b. 03/2012 NSTEMI/Cath: LM 50, LAD 50p/m, 73md, LCX nl, RI 60ost sup branch, RCA diff nonobs, EF 55%  . Radiation proctitis 02/2013  . NSTEMI (non-ST elevated myocardial infarction) 02/2013  . Diabetes mellitus without complication     Past Surgical History  Procedure Laterality Date  . Hip arthroplasty  12/08/2010    ARTHROPLASTY BIPOLAR HIP;  Surgeon-Olin; Left  . 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 I&d extremity  05/28/2011    Procedure: IRRIGATION AND DEBRIDEMENT EXTREMITY;  Surgeon: FLinna Hoff MD;  Location: [facility]  Service: Orthopedics;  Laterality: Right;  I & D Right hand/wrist  . Colonoscopy   07/18/2003    RMR: Rectum internal hemorrhoids.  Otherwise, normal rectum/ Left-sided diverticula.  Remainder of colonic mucosa-nl  . Flexible sigmoidoscopy N/A 03/18/2013    Procedure: FLEXIBLE SIGMOIDOSCOPY;  Surgeon: SWonda Horner MD;  [facility];  Service: Endoscopy;  Laterality: N/A;  . Cardiac catheterization  03/2012    non-obstructive ASCAD, medial management only, pt does not want repeat cath    Family History  Problem Relation Age of Onset  . Prostate cancer Brother   . Colon cancer Sister 774 . Diabetes Mellitus II Father   . Stroke Father     Social History:  reports that he quit smoking  about 65 years ago. His smoking use included Cigarettes and Cigars. He smoked 0.00 packs per day. His smokeless tobacco use includes Chew. He reports that he does not drink alcohol or use illicit drugs.  Allergies:  Allergies  Allergen Reactions  . Tetanus Toxoids Swelling    Also allergic to "high powered pain medications."  . Oxycodone     Makes him loopy    Medications; Prior to Admission medications   Medication Sig Start Date End Date Taking? Authorizing Provider  allopurinol (ZYLOPRIM) 300 MG tablet Take 300 mg by mouth every morning.    Yes Historical  Provider, MD  calcium-vitamin D (OSCAL WITH D) 500-200 MG-UNIT per tablet Take 1 tablet by mouth every evening.    Yes Historical Provider, MD  COD LIVER OIL PO Take 1 tablet by mouth 2 (two) times daily.    Yes Historical Provider, MD  diltiazem (CARDIZEM CD) 180 MG 24 hr capsule Take 180 mg by mouth every morning.    Yes Historical Provider, MD  Edoxaban Tosylate (SAVAYSA) 60 MG TABS Take 60 mg by mouth daily.   Yes Historical Provider, MD  furosemide (LASIX) 20 MG tablet Take 20 mg by mouth daily as needed for fluid.   Yes Historical Provider, MD  gabapentin (NEURONTIN) 300 MG capsule Take 1 capsule (300 mg total) by mouth 3 (three) times daily. 05/08/13  Yes Kathie Dike, MD  glipiZIDE (GLUCOTROL XL) 2.5 MG 24 hr tablet Take 2.5 mg by mouth every morning.    Yes Historical Provider, MD  HYDROcodone-acetaminophen (NORCO) 10-325 MG per tablet Take 1 tablet by mouth every 6 (six) hours as needed for moderate pain.   Yes Historical Provider, MD  metoprolol (LOPRESSOR) 50 MG tablet Take 25 mg by mouth 2 (two) times daily. TAKEHALF TAB IN AM TAKE HALF TAB AT PM   Yes Historical Provider, MD  Multiple Vitamin (MULITIVITAMIN WITH MINERALS) TABS Take 1 tablet by mouth every evening.    Yes Historical Provider, MD  mupirocin ointment (BACTROBAN) 2 % Apply 1 application topically 3 (three) times daily.   Yes Historical Provider, MD  nitroGLYCERIN (NITROSTAT) 0.4 MG SL tablet Place 0.4 mg under the tongue every 5 (five) minutes as needed for chest pain.   Yes Historical Provider, MD  polyethylene glycol (MIRALAX / GLYCOLAX) packet Take 17 g by mouth daily as needed for mild constipation or severe constipation. 03/23/13  Yes Domenic Polite, MD  predniSONE (DELTASONE) 10 MG tablet Take 10-20 mg by mouth 2 (two) times daily as needed (Currently taking 1 tablet in the morning and one tablet at bedtime for gout.). Hold this while you are on higher dose prednisone taper 12/02/12  Yes Ripudeep K Rai, MD  simvastatin  (ZOCOR) 20 MG tablet Take 1 tablet (20 mg total) by mouth every evening. 05/10/13  Yes Lendon Colonel, NP  valACYclovir (VALTREX) 500 MG tablet Take 500 mg by mouth 2 (two) times daily.   Yes Historical Provider, MD  rivaroxaban (XARELTO) 20 MG TABS tablet Take 1 tablet (20 mg total) by mouth daily with supper. 05/18/13   Lendon Colonel, NP   . sodium chloride   Intravenous STAT  . [START ON 05/20/2013] allopurinol  300 mg Oral q morning - 10a  . calcium-vitamin D  1 tablet Oral QPM  . [START ON 05/20/2013] diltiazem  180 mg Oral q morning - 10a  . gabapentin  300 mg Oral TID  . [START ON 05/20/2013] glipiZIDE  2.5 mg Oral Q breakfast  .  insulin aspart  0-15 Units Subcutaneous TID WC  . metoprolol  25 mg Oral BID  . multivitamin with minerals  1 tablet Oral QPM  . mupirocin ointment  1 application Topical TID  . simvastatin  20 mg Oral QPM  . sodium chloride  3 mL Intravenous Q12H  . valACYclovir  500 mg Oral BID   PRN Meds sodium chloride, furosemide, HYDROcodone-acetaminophen, HYDROmorphone (DILAUDID) injection, nitroGLYCERIN, polyethylene glycol, predniSONE, sodium chloride Results for orders placed during the hospital encounter of 05/19/13 (from the past 48 hour(s))  CBC WITH DIFFERENTIAL     Status: Abnormal   Collection Time    05/19/13  2:48 PM      Result Value Ref Range   WBC 8.5  4.0 - 10.5 K/uL   RBC 3.77 (*) 4.22 - 5.81 MIL/uL   Hemoglobin 13.0  13.0 - 17.0 g/dL   HCT 38.4 (*) 39.0 - 52.0 %   MCV 101.9 (*) 78.0 - 100.0 fL   MCH 34.5 (*) 26.0 - 34.0 pg   MCHC 33.9  30.0 - 36.0 g/dL   RDW 16.8 (*) 11.5 - 15.5 %   Platelets 151  150 - 400 K/uL   Neutrophils Relative % 87 (*) 43 - 77 %   Neutro Abs 7.4  1.7 - 7.7 K/uL   Lymphocytes Relative 6 (*) 12 - 46 %   Lymphs Abs 0.5 (*) 0.7 - 4.0 K/uL   Monocytes Relative 7  3 - 12 %   Monocytes Absolute 0.6  0.1 - 1.0 K/uL   Eosinophils Relative 0  0 - 5 %   Eosinophils Absolute 0.0  0.0 - 0.7 K/uL   Basophils Relative 0   0 - 1 %   Basophils Absolute 0.0  0.0 - 0.1 K/uL  COMPREHENSIVE METABOLIC PANEL     Status: Abnormal   Collection Time    05/19/13  2:48 PM      Result Value Ref Range   Sodium 134 (*) 137 - 147 mEq/L   Potassium 4.8  3.7 - 5.3 mEq/L   Chloride 99  96 - 112 mEq/L   CO2 19  19 - 32 mEq/L   Glucose, Bld 259 (*) 70 - 99 mg/dL   BUN 25 (*) 6 - 23 mg/dL   Creatinine, Ser 0.99  0.50 - 1.35 mg/dL   Calcium 10.3  8.4 - 10.5 mg/dL   Total Protein 5.5 (*) 6.0 - 8.3 g/dL   Albumin 2.8 (*) 3.5 - 5.2 g/dL   AST 25  0 - 37 U/L   ALT 35  0 - 53 U/L   Alkaline Phosphatase 53  39 - 117 U/L   Total Bilirubin 0.3  0.3 - 1.2 mg/dL   GFR calc non Af Amer 76 (*) >90 mL/min   GFR calc Af Amer 88 (*) >90 mL/min   Comment: (NOTE)     The eGFR has been calculated using the CKD EPI equation.     This calculation has not been validated in all clinical situations.     eGFR's persistently <90 mL/min signify possible Chronic Kidney     Disease.  TYPE AND SCREEN     Status: None   Collection Time    05/19/13  3:04 PM      Result Value Ref Range   ABO/RH(D) A NEG     Antibody Screen NEG     Sample Expiration 05/22/2013    ABO/RH     Status: None   Collection Time  05/19/13  3:04 PM      Result Value Ref Range   ABO/RH(D) A NEG    PROTIME-INR     Status: Abnormal   Collection Time    05/19/13  3:14 PM      Result Value Ref Range   Prothrombin Time 16.8 (*) 11.6 - 15.2 seconds   INR 1.40  0.00 - 1.49  POC OCCULT BLOOD, ED     Status: Abnormal   Collection Time    05/19/13  3:31 PM      Result Value Ref Range   Fecal Occult Bld POSITIVE (*) NEGATIVE  URINALYSIS, ROUTINE W REFLEX MICROSCOPIC     Status: Abnormal   Collection Time    05/19/13  4:05 PM      Result Value Ref Range   Color, Urine YELLOW  YELLOW   APPearance CLEAR  CLEAR   Specific Gravity, Urine 1.020  1.005 - 1.030   pH 6.0  5.0 - 8.0   Glucose, UA 250 (*) NEGATIVE mg/dL   Hgb urine dipstick NEGATIVE  NEGATIVE   Bilirubin Urine  NEGATIVE  NEGATIVE   Ketones, ur NEGATIVE  NEGATIVE mg/dL   Protein, ur NEGATIVE  NEGATIVE mg/dL   Urobilinogen, UA 0.2  0.0 - 1.0 mg/dL   Nitrite NEGATIVE  NEGATIVE   Leukocytes, UA NEGATIVE  NEGATIVE   Comment: MICROSCOPIC NOT DONE ON URINES WITH NEGATIVE PROTEIN, BLOOD, LEUKOCYTES, NITRITE, OR GLUCOSE <1000 mg/dL.    Dg Abd Acute W/chest  05/19/2013   CLINICAL DATA:  constipation  EXAM: ACUTE ABDOMEN SERIES (ABDOMEN 2 VIEW & CHEST 1 VIEW)  COMPARISON:  DG CHEST 1 VIEW dated 05/06/2013; CT ABD - PELV W/ CM dated 04/01/2013; CT ABD - PELV W/ CM dated 05/06/2013  FINDINGS: There is no evidence of dilated bowel loops or free intraperitoneal air. No radiopaque calculi or other significant radiographic abnormality is seen. The cardiac silhouette is enlarged. Both lungs are clear.  IMPRESSION: Negative abdominal radiographs.  No acute cardiopulmonary disease.   Electronically Signed   By: Margaree Mackintosh M.D.   On: 05/19/2013 16:32              Blood pressure 115/64, pulse 91, temperature 97.8 F (36.6 C), temperature source Oral, resp. rate 18, SpO2 96.00%.  Physical exam:   General-- elderly white male in no distress. Multiple echymoses all over his arms. Heart-- regular rate and rhythm without murmurs are gallops Lungs--clear Abdomen-- distended and obese but soft and nontender   Assessment: 1. Painless hematochezia. This is almost certainly due to radiation proctitis. Patient just had sigmoidoscopy show no other source of bleeding about 6 weeks ago. I don't think that needs to be repeated as long as the bleeding stops. 2. Anticoagulation due to afib, DVT, history of the mind coronary disease etc.  Plan: 1. What holds relative of for now and treated him with Miralax 2 to 3 times a day as needed to obtain soft bowel movements. See no need for endoscopic evaluation unless the bleeding does not stop. We will follow with you.   Winfield Cunas. 05/19/2013, 5:33 PM

## 2013-05-19 NOTE — ED Provider Notes (Signed)
CSN: [id number]     Arrival date & time 05/19/13  1432 History   First MD Initiated Contact with Patient 05/19/13 1432     Chief Complaint  Patient presents with  . Rectal Bleeding  . Rash     (Consider location/radiation/quality/duration/timing/severity/associated sxs/prior Treatment) The history is provided by the patient.  [name] is a 80 y.o. male hx of HTN, HL, L leg DVT on xarelto, CAD, recent diagnosis of shingles here with rectal bleeding. He had an episode of bright red blood per rectum yesterday, around 1 teaspoon in volume. He then had some gas and had a second episode. He had this several months ago and had sigmoidoscopy that showed bleeding from dilated capillary from rectum, likely secondary to self disimpaction and previous radiation for prostate cancer. He was admitted recently for weakness and pain control for shingles. He is still on valtrex and doesn't like to take pain meds so has been in pain for several days. Per family, he is not disoriented, just in severe pain.    Past Medical History  Diagnosis Date  . Hypertension   . Gout     Acute episode involving ankle in 12/2011  . Hyperlipidemia   . Deep vein thrombophlebitis of left leg 11/2010    LLE; S/P THA  . Fasting hyperglycemia   . Stroke 08/2009; 07/2010    TIA in 07/2010  . H/O heat stroke 08/2009  . Prostate cancer 2013    adenocarcinoma; Gleason grade 8; PSA 9.45; EBRT 2013-14  . Renal cyst   . Syncope     01/2012: Negative pharmacologic stress nuclear  . PAF (paroxysmal atrial fibrillation)     a. first noted 01/2012 during admission for URI;  b. seen again 03/2012 after nstemi of unknown origin - pradaxa initiated.  Marland Kitchen CAD (coronary artery disease)     a. 01/2012 Elev Ti-->nonischemic MV;  b. 03/2012 NSTEMI/Cath: LM 50, LAD 50p/m, 22m/d, LCX nl, RI 60ost sup branch, RCA diff nonobs, EF 55%  . Radiation proctitis 02/2013  . NSTEMI (non-ST elevated myocardial infarction) 02/2013  . Diabetes mellitus  without complication    Past Surgical History  Procedure Laterality Date  . Hip arthroplasty  12/08/2010    ARTHROPLASTY BIPOLAR HIP;  Surgeon-Olin; Left  . 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 I&d extremity  05/28/2011    Procedure: IRRIGATION AND DEBRIDEMENT EXTREMITY;  Surgeon: Linna Hoff, MD;  Location: [facility];  Service: Orthopedics;  Laterality: Right;  I & D Right hand/wrist  . Colonoscopy   07/18/2003    RMR: Rectum internal hemorrhoids.  Otherwise, normal rectum/ Left-sided diverticula.  Remainder of colonic mucosa-nl  . Flexible sigmoidoscopy N/A 03/18/2013    Procedure: FLEXIBLE SIGMOIDOSCOPY;  Surgeon: Wonda Horner, MD;  [facility];  Service: Endoscopy;  Laterality: N/A;  . Cardiac catheterization  03/2012    non-obstructive ASCAD, medial management only, pt does not want repeat cath   Family History  Problem Relation Age of Onset  . Prostate cancer Brother   . Colon cancer Sister 76  . Diabetes Mellitus II Father   . Stroke Father    History  Substance Use Topics  . Smoking status: Former Smoker    Types: Cigarettes, Cigars    Quit date: 01/27/1948  . Smokeless tobacco: Current User    Types: Chew     Comment: "quit smoking cigarettes age 7 or 82"  .  Alcohol Use: No    Review of Systems  Gastrointestinal: Positive for blood in stool and hematochezia.  Skin: Positive for rash.  All other systems reviewed and are negative.     Allergies  Tetanus toxoids and Oxycodone  Home Medications   Prior to Admission medications   Medication Sig Start Date End Date Taking? Authorizing Provider  allopurinol (ZYLOPRIM) 300 MG tablet Take 300 mg by mouth every morning.    Yes Historical Provider, MD  calcium-vitamin D (OSCAL WITH D) 500-200 MG-UNIT per tablet Take 1 tablet by mouth every evening.    Yes Historical Provider, MD  COD LIVER OIL PO Take 1 tablet by mouth 2 (two) times daily.    Yes Historical Provider, MD   diltiazem (CARDIZEM CD) 180 MG 24 hr capsule Take 180 mg by mouth every morning.    Yes Historical Provider, MD  Edoxaban Tosylate (SAVAYSA) 60 MG TABS Take 60 mg by mouth daily.   Yes Historical Provider, MD  furosemide (LASIX) 20 MG tablet Take 20 mg by mouth daily as needed for fluid.   Yes Historical Provider, MD  gabapentin (NEURONTIN) 300 MG capsule Take 1 capsule (300 mg total) by mouth 3 (three) times daily. 05/08/13  Yes Kathie Dike, MD  glipiZIDE (GLUCOTROL XL) 2.5 MG 24 hr tablet Take 2.5 mg by mouth every morning.    Yes Historical Provider, MD  HYDROcodone-acetaminophen (NORCO) 10-325 MG per tablet Take 1 tablet by mouth every 6 (six) hours as needed for moderate pain.   Yes Historical Provider, MD  metoprolol (LOPRESSOR) 50 MG tablet Take 25 mg by mouth 2 (two) times daily. TAKEHALF TAB IN AM TAKE HALF TAB AT PM   Yes Historical Provider, MD  Multiple Vitamin (MULITIVITAMIN WITH MINERALS) TABS Take 1 tablet by mouth every evening.    Yes Historical Provider, MD  mupirocin ointment (BACTROBAN) 2 % Apply 1 application topically 3 (three) times daily.   Yes Historical Provider, MD  nitroGLYCERIN (NITROSTAT) 0.4 MG SL tablet Place 0.4 mg under the tongue every 5 (five) minutes as needed for chest pain.   Yes Historical Provider, MD  polyethylene glycol (MIRALAX / GLYCOLAX) packet Take 17 g by mouth daily as needed for mild constipation or severe constipation. 03/23/13  Yes Domenic Polite, MD  predniSONE (DELTASONE) 10 MG tablet Take 10-20 mg by mouth 2 (two) times daily as needed (Currently taking 1 tablet in the morning and one tablet at bedtime for gout.). Hold this while you are on higher dose prednisone taper 12/02/12  Yes Ripudeep K Rai, MD  simvastatin (ZOCOR) 20 MG tablet Take 1 tablet (20 mg total) by mouth every evening. 05/10/13  Yes Lendon Colonel, NP  valACYclovir (VALTREX) 500 MG tablet Take 500 mg by mouth 2 (two) times daily.   Yes Historical Provider, MD  rivaroxaban  (XARELTO) 20 MG TABS tablet Take 1 tablet (20 mg total) by mouth daily with supper. 05/18/13   Lendon Colonel, NP   BP 127/57  Pulse 90  Temp(Src) 97.5 F (36.4 C) (Oral)  Resp 22  SpO2 100% Physical Exam  Nursing note and vitals reviewed. Constitutional: He is oriented to person, place, and time.  Uncomfortable, crying in pain   HENT:  Head: Normocephalic.  Mouth/Throat: Oropharynx is clear and moist.  Eyes: Conjunctivae are normal. Pupils are equal, round, and reactive to light.  Neck: Normal range of motion. Neck supple.  Cardiovascular: Normal rate, regular rhythm and normal heart sounds.   Pulmonary/Chest: Effort normal  and breath sounds normal. No respiratory distress. He has no wheezes. He has no rales.  Abdominal: Soft. Bowel sounds are normal. He exhibits no distension. There is no tenderness. There is no rebound and no guarding.  Obese   Genitourinary:  No hemorrhoids, not impacted. No obvious blood.   Musculoskeletal: Normal range of motion.  Neurological: He is alert and oriented to person, place, and time.  Moving all extremities   Skin: Skin is warm.  Vesicular rash R groin and R upper leg   Psychiatric: He has a normal mood and affect. His behavior is normal. Judgment and thought content normal.    ED Course  Procedures (including critical care time) Labs Review Labs Reviewed  CBC WITH DIFFERENTIAL - Abnormal; Notable for the following:    RBC 3.77 (*)    HCT 38.4 (*)    MCV 101.9 (*)    MCH 34.5 (*)    RDW 16.8 (*)    Neutrophils Relative % 87 (*)    Lymphocytes Relative 6 (*)    Lymphs Abs 0.5 (*)    All other components within normal limits  COMPREHENSIVE METABOLIC PANEL - Abnormal; Notable for the following:    Sodium 134 (*)    Glucose, Bld 259 (*)    BUN 25 (*)    Total Protein 5.5 (*)    Albumin 2.8 (*)    GFR calc non Af Amer 76 (*)    GFR calc Af Amer 88 (*)    All other components within normal limits  PROTIME-INR - Abnormal; Notable  for the following:    Prothrombin Time 16.8 (*)    All other components within normal limits  POC OCCULT BLOOD, ED - Abnormal; Notable for the following:    Fecal Occult Bld POSITIVE (*)    All other components within normal limits  URINALYSIS, ROUTINE W REFLEX MICROSCOPIC  TYPE AND SCREEN  ABO/RH    Imaging Review No results found.   EKG Interpretation None      MDM   Final diagnoses:  None    [name] is a 80 y.o. male here with rectal bleeding and pain from shingles. Will get labs, type and screen, coags. Will give pain meds.   4 pm Occ positive, brown stool. Hg 13, no need for transfusion. I called Dr. Oletta Lamas, who will see patient in the hospital.   4:18 PM Patient admitted to medicine service.     Wandra Arthurs, MD 05/19/13 [phone]

## 2013-05-19 NOTE — ED Notes (Signed)
Pt presents via [name] from home with c/o bright red blood from rectum since yesterday and shingles rash to right thigh/hip. Pt c/o intense pain to right thigh. Per pt's niece and brother, pt has been slightly disoriented which is not the pt's baseline x2 days

## 2013-05-19 NOTE — Progress Notes (Signed)
Obtained PCR MRSA screening d/t pt hx of positive MRSA, placed on contact isolation per order.  PCR sent to labe

## 2013-05-19 NOTE — H&P (Signed)
[facility] History and Physical  [name] GHW:[id number] DOB: 07/12/1931 DOA: 05/19/2013  Referring physician:  PCP: Leonides Grills, MD  Specialists:   Chief Complaint: Bleeding  HPI: [name] is a 80 y.o. male  With a history of hypertension, hyperlipidemia, left leg DVT, coronary artery disease, history of rectal bleeding, prostate cancer status post radiation, presents emergency department for bright red blood per rectum. Patient states this has been ongoing for approximately 4 days. He had a similar episode in February of 2015 at which point he underwent a sigmoidoscopy which showed bleeding from a dilated capillary, which was due to self disimpaction. Patient was recently admitted on 05/07/2013 weakness and pain control shingles. He is currently taking Valtrex and prednisone. Patient is currently in pain. He only complains of shaking and weakness.  Review of Systems:  Constitutional: Denies fever, chills, diaphoresis, appetite change and fatigue.  HEENT: Denies photophobia, eye pain, redness, hearing loss, ear pain, congestion, sore throat, rhinorrhea, sneezing, mouth sores, trouble swallowing, neck pain, neck stiffness and tinnitus.   Respiratory: Denies SOB, DOE, cough, chest tightness,  and wheezing.   Cardiovascular: Denies chest pain, palpitations and leg swelling.  Gastrointestinal: Admits to bright red blood per rectum. Genitourinary: Denies dysuria, urgency, frequency, hematuria, flank pain and difficulty urinating.  Musculoskeletal: Denies myalgias, back pain, joint swelling, arthralgias and gait problem.  Skin: Has a rash Neurological: Complains of feeling weak and shaky. Hematological: Denies adenopathy. Easy bruising, personal or family bleeding history  Psychiatric/Behavioral: Denies suicidal ideation, mood changes, confusion, nervousness, sleep disturbance and agitation  Past Medical History  Diagnosis Date  . Hypertension   . Gout     Acute  episode involving ankle in 12/2011  . Hyperlipidemia   . Deep vein thrombophlebitis of left leg 11/2010    LLE; S/P THA  . Fasting hyperglycemia   . Stroke 08/2009; 07/2010    TIA in 07/2010  . H/O heat stroke 08/2009  . Prostate cancer 2013    adenocarcinoma; Gleason grade 8; PSA 9.45; EBRT 2013-14  . Renal cyst   . Syncope     01/2012: Negative pharmacologic stress nuclear  . PAF (paroxysmal atrial fibrillation)     a. first noted 01/2012 during admission for URI;  b. seen again 03/2012 after nstemi of unknown origin - pradaxa initiated.  Marland Kitchen CAD (coronary artery disease)     a. 01/2012 Elev Ti-->nonischemic MV;  b. 03/2012 NSTEMI/Cath: LM 50, LAD 50p/m, 38m/d, LCX nl, RI 60ost sup branch, RCA diff nonobs, EF 55%  . Radiation proctitis 02/2013  . NSTEMI (non-ST elevated myocardial infarction) 02/2013  . Diabetes mellitus without complication    Past Surgical History  Procedure Laterality Date  . Hip arthroplasty  12/08/2010    ARTHROPLASTY BIPOLAR HIP;  Surgeon-Olin; Left  . 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 I&d extremity  05/28/2011    Procedure: IRRIGATION AND DEBRIDEMENT EXTREMITY;  Surgeon: Linna Hoff, MD;  Location: [facility];  Service: Orthopedics;  Laterality: Right;  I & D Right hand/wrist  . Colonoscopy   07/18/2003    RMR: Rectum internal hemorrhoids.  Otherwise, normal rectum/ Left-sided diverticula.  Remainder of colonic mucosa-nl  . Flexible sigmoidoscopy N/A 03/18/2013    Procedure: FLEXIBLE SIGMOIDOSCOPY;  Surgeon: Wonda Horner, MD;  Location: [facility]COPY;  Service: Endoscopy;  Laterality: N/A;  . Cardiac catheterization  03/2012    non-obstructive ASCAD, medial management only, pt does  not want repeat cath   Social History:  reports that he quit smoking about 65 years ago. His smoking use included Cigarettes and Cigars. He smoked 0.00 packs per day. His smokeless tobacco use includes Chew. He reports that he does not drink alcohol or use  illicit drugs. Currently lives at home with his wife. Has caregivers at home.  Allergies  Allergen Reactions  . Tetanus Toxoids Swelling    Also allergic to "high powered pain medications."  . Oxycodone     Makes him loopy    Family History  Problem Relation Age of Onset  . Prostate cancer Brother   . Colon cancer Sister 56  . Diabetes Mellitus II Father   . Stroke Father     Prior to Admission medications   Medication Sig Start Date End Date Taking? Authorizing Provider  allopurinol (ZYLOPRIM) 300 MG tablet Take 300 mg by mouth every morning.    Yes Historical Provider, MD  calcium-vitamin D (OSCAL WITH D) 500-200 MG-UNIT per tablet Take 1 tablet by mouth every evening.    Yes Historical Provider, MD  COD LIVER OIL PO Take 1 tablet by mouth 2 (two) times daily.    Yes Historical Provider, MD  diltiazem (CARDIZEM CD) 180 MG 24 hr capsule Take 180 mg by mouth every morning.    Yes Historical Provider, MD  Edoxaban Tosylate (SAVAYSA) 60 MG TABS Take 60 mg by mouth daily.   Yes Historical Provider, MD  furosemide (LASIX) 20 MG tablet Take 20 mg by mouth daily as needed for fluid.   Yes Historical Provider, MD  gabapentin (NEURONTIN) 300 MG capsule Take 1 capsule (300 mg total) by mouth 3 (three) times daily. 05/08/13  Yes Kathie Dike, MD  glipiZIDE (GLUCOTROL XL) 2.5 MG 24 hr tablet Take 2.5 mg by mouth every morning.    Yes Historical Provider, MD  HYDROcodone-acetaminophen (NORCO) 10-325 MG per tablet Take 1 tablet by mouth every 6 (six) hours as needed for moderate pain.   Yes Historical Provider, MD  metoprolol (LOPRESSOR) 50 MG tablet Take 25 mg by mouth 2 (two) times daily. TAKEHALF TAB IN AM TAKE HALF TAB AT PM   Yes Historical Provider, MD  Multiple Vitamin (MULITIVITAMIN WITH MINERALS) TABS Take 1 tablet by mouth every evening.    Yes Historical Provider, MD  mupirocin ointment (BACTROBAN) 2 % Apply 1 application topically 3 (three) times daily.   Yes Historical Provider, MD    nitroGLYCERIN (NITROSTAT) 0.4 MG SL tablet Place 0.4 mg under the tongue every 5 (five) minutes as needed for chest pain.   Yes Historical Provider, MD  polyethylene glycol (MIRALAX / GLYCOLAX) packet Take 17 g by mouth daily as needed for mild constipation or severe constipation. 03/23/13  Yes Domenic Polite, MD  predniSONE (DELTASONE) 10 MG tablet Take 10-20 mg by mouth 2 (two) times daily as needed (Currently taking 1 tablet in the morning and one tablet at bedtime for gout.). Hold this while you are on higher dose prednisone taper 12/02/12  Yes Ripudeep K Rai, MD  simvastatin (ZOCOR) 20 MG tablet Take 1 tablet (20 mg total) by mouth every evening. 05/10/13  Yes Lendon Colonel, NP  valACYclovir (VALTREX) 500 MG tablet Take 500 mg by mouth 2 (two) times daily.   Yes Historical Provider, MD  rivaroxaban (XARELTO) 20 MG TABS tablet Take 1 tablet (20 mg total) by mouth daily with supper. 05/18/13   Lendon Colonel, NP   Physical Exam: Danley Danker Vitals:  05/19/13 1603  BP:   Pulse: 90  Temp:   Resp: 22     General: Well developed, well nourished, mild distress, appears stated age  HEENT: NCAT, PERRLA, EOMI, Anicteic Sclera, mucous membranes moist.  Neck: Supple, no JVD, no masses  Cardiovascular: S1 S2 auscultated, no rubs, murmurs or gallops. Regular rate and rhythm.  Respiratory: Clear to auscultation bilaterally with equal chest rise  Abdomen: Soft, obese, nontender, nondistended, + bowel sounds  Extremities: warm dry without cyanosis clubbing or edema  Neuro: AAOx3, patient will not cooperate with exam due to pain, moves all 4 extremities   Skin: Vesicular rash noted in the right groin and right inner thigh  Labs on Admission:  Basic Metabolic Panel:  Recent Labs Lab 05/19/13 1448  NA 134*  K 4.8  CL 99  CO2 19  GLUCOSE 259*  BUN 25*  CREATININE 0.99  CALCIUM 10.3   Liver Function Tests:  Recent Labs Lab 05/19/13 1448  AST 25  ALT 35  ALKPHOS 53  BILITOT  0.3  PROT 5.5*  ALBUMIN 2.8*   No results found for this basename: LIPASE, AMYLASE,  in the last 168 hours No results found for this basename: AMMONIA,  in the last 168 hours CBC:  Recent Labs Lab 05/19/13 1448  WBC 8.5  NEUTROABS 7.4  HGB 13.0  HCT 38.4*  MCV 101.9*  PLT 151   Cardiac Enzymes: No results found for this basename: CKTOTAL, CKMB, CKMBINDEX, TROPONINI,  in the last 168 hours  BNP (last 3 results) No results found for this basename: PROBNP,  in the last 8760 hours CBG: No results found for this basename: GLUCAP,  in the last 168 hours  Radiological Exams on Admission: No results found.  EKG: None  Assessment/Plan  Lower GI bleed/bright red blood per rectum -Patient will be admitted to medical floor. -Currently hemoglobin is stable at 13, will continue to monitor CBC -Gastroenterology, Dr. Oletta Lamas, has been consulted by the ER -Patient had similar episode in February 2015, underwent sigmoidoscopy -Patient does have a history of prostate cancer status post radiation  Shingles  -Continue about her and prednisone  Hypertension -Continue metoprolol and Lasix  Diabetes mellitus with neuropathy -Continue glipizide -Continue gabapentin for neuropathy -Will place patient on insulin sliding scale with CBG monitoring -Last hemoglobin A1c November 2014 6.6  Hyperlipidemia -Continue simvastatin  Gout -Continue allopurinol  Paroxysmal atrial fibrillation -Continue diltiazem -Will hold anticoagulation at this time  DVT -Will hold anticoagulation secondary to GI bleed  History of prostate cancer status post radiation -Stable  DVT prophylaxis: SCDs  Code Status: DNR  Condition: Guarded  Family Communication: Family at bedside. Admission, patients condition and plan of care including tests being ordered have been discussed with the patient and family who indicate understanding and agree with the plan and Code Status.  Disposition Plan:  Admitted   Time spent: 60 minutes  Hardy D.O. [facility] Pager [phone]  If 7PM-7AM, please contact night-coverage [email or web address] Password [facility] 05/19/2013, 4:27 PM

## 2013-05-20 DIAGNOSIS — B0229 Other postherpetic nervous system involvement: Secondary | ICD-10-CM

## 2013-05-20 LAB — GLUCOSE, CAPILLARY
GLUCOSE-CAPILLARY: 110 mg/dL — AB (ref 70–99)
Glucose-Capillary: 90 mg/dL (ref 70–99)
Glucose-Capillary: 105 mg/dL — ABNORMAL HIGH (ref 70–99)
Glucose-Capillary: 103 mg/dL — ABNORMAL HIGH (ref 70–99)
Glucose-Capillary: 104 mg/dL — ABNORMAL HIGH (ref 70–99)

## 2013-05-20 LAB — CBC
HEMATOCRIT: 36.4 % — AB (ref 39.0–52.0)
Hemoglobin: 12.1 g/dL — ABNORMAL LOW (ref 13.0–17.0)
MCH: 34.9 pg — AB (ref 26.0–34.0)
MCHC: 33.2 g/dL (ref 30.0–36.0)
MCV: 104.9 fL — AB (ref 78.0–100.0)
PLATELETS: 135 10*3/uL — AB (ref 150–400)
RBC: 3.47 MIL/uL — ABNORMAL LOW (ref 4.22–5.81)
RDW: 17.3 % — AB (ref 11.5–15.5)
WBC: 7 10*3/uL (ref 4.0–10.5)

## 2013-05-20 LAB — BASIC METABOLIC PANEL
BUN: 21 mg/dL (ref 6–23)
CALCIUM: 9.6 mg/dL (ref 8.4–10.5)
CO2: 23 mEq/L (ref 19–32)
CREATININE: 1.1 mg/dL (ref 0.50–1.35)
Chloride: 107 mEq/L (ref 96–112)
GFR calc Af Amer: 72 mL/min — ABNORMAL LOW (ref >90)
GFR calc non Af Amer: 62 mL/min — ABNORMAL LOW (ref >90)
GLUCOSE: 91 mg/dL (ref 70–99)
Potassium: 4.4 mEq/L (ref 3.7–5.3)
Sodium: 142 mEq/L (ref 137–147)

## 2013-05-20 MED ORDER — GABAPENTIN 400 MG PO CAPS
400.0000 mg | ORAL_CAPSULE | Freq: Three times a day (TID) | ORAL | Status: DC
  Administered 2013-05-20 – 2013-05-22 (×8): 400 mg via ORAL
  Filled 2013-05-20 (×11): qty 1

## 2013-05-20 MED ORDER — HYDROMORPHONE HCL PF 1 MG/ML IJ SOLN
0.5000 mg | INTRAMUSCULAR | Status: DC | PRN
  Administered 2013-05-21 – 2013-05-22 (×5): 0.5 mg via INTRAVENOUS
  Filled 2013-05-20 (×6): qty 1

## 2013-05-20 MED ORDER — LIDOCAINE 4 % EX CREA
TOPICAL_CREAM | Freq: Two times a day (BID) | CUTANEOUS | Status: DC | PRN
  Administered 2013-05-21 – 2013-05-22 (×2): 1 via TOPICAL
  Filled 2013-05-20 (×2): qty 5

## 2013-05-20 MED ORDER — ATORVASTATIN CALCIUM 10 MG PO TABS
10.0000 mg | ORAL_TABLET | Freq: Every day | ORAL | Status: DC
  Administered 2013-05-20 – 2013-05-30 (×8): 10 mg via ORAL
  Filled 2013-05-20 (×12): qty 1

## 2013-05-20 MED ORDER — DOCUSATE SODIUM 100 MG PO CAPS
100.0000 mg | ORAL_CAPSULE | Freq: Two times a day (BID) | ORAL | Status: DC
  Administered 2013-05-20 – 2013-05-30 (×15): 100 mg via ORAL
  Filled 2013-05-20 (×15): qty 1

## 2013-05-20 NOTE — Progress Notes (Signed)
PROGRESS NOTE  [name] ZTI:[id number] DOB: [birth year] DOA: 05/19/2013 PCP: Leonides Grills, MD  Assessment/Plan:  Painless hematochezia. This is almost certainly due to radiation proctitis. Patient just had sigmoidoscopy show no other source of bleeding about 6 weeks ago.  -treated him with Miralax 2 to 3 times a day as needed to obtain soft bowel movements.  -no need for endoscopic evaluation unless the bleeding does not stop. -  Shingles  -treated but still painful -will increase neurontin  Hypertension  -Continue metoprolol and Lasix   Diabetes mellitus with neuropathy  -Continue glipizide  -Continue gabapentin for neuropathy  -Will place patient on insulin sliding scale with CBG monitoring  -Last hemoglobin A1c November 2014 6.6    Code Status: DNR Family Communication: family at bedside Disposition Plan:    Consultants:  GI  Procedures:       HPI/Subjective: Severe leg pain  Objective: Filed Vitals:   05/20/13 0509  BP: 133/73  Pulse: 103  Temp: 97.5 F (36.4 C)  Resp: 18    Intake/Output Summary (Last 24 hours) at 05/20/13 1104 Last data filed at 05/20/13 0949  Gross per 24 hour  Intake 1213.75 ml  Output    350 ml  Net 863.75 ml   There were no vitals filed for this visit.  Exam:  General: appears in pain  Cardiovascular: S1 S2 auscultated, no rubs, murmurs or gallops. Regular rate and rhythm.  Respiratory: Clear to auscultation bilaterally with equal chest rise  Abdomen: Soft, obese, nontender, nondistended, + bowel sounds  Neuro: AAOx3, patient will not cooperate with exam due to pain, moves all 4 extremities  Skin: scarring on right thigh  Data Reviewed: Basic Metabolic Panel:  Recent Labs Lab 05/19/13 1448 05/20/13 0630  NA 134* 142  K 4.8 4.4  CL 99 107  CO2 19 23  GLUCOSE 259* 91  BUN 25* 21  CREATININE 0.99 1.10  CALCIUM 10.3 9.6   Liver Function Tests:  Recent Labs Lab 05/19/13 1448  AST 25  ALT 35    ALKPHOS 53  BILITOT 0.3  PROT 5.5*  ALBUMIN 2.8*   No results found for this basename: LIPASE, AMYLASE,  in the last 168 hours No results found for this basename: AMMONIA,  in the last 168 hours CBC:  Recent Labs Lab 05/19/13 1448 05/20/13 0630  WBC 8.5 7.0  NEUTROABS 7.4  --   HGB 13.0 12.1*  HCT 38.4* 36.4*  MCV 101.9* 104.9*  PLT 151 135*   Cardiac Enzymes: No results found for this basename: CKTOTAL, CKMB, CKMBINDEX, TROPONINI,  in the last 168 hours BNP (last 3 results) No results found for this basename: PROBNP,  in the last 8760 hours CBG:  Recent Labs Lab 05/20/13 0732  GLUCAP 105*    Recent Results (from the past 240 hour(s))  MRSA PCR SCREENING     Status: None   Collection Time    05/19/13  7:37 PM      Result Value Ref Range Status   MRSA by PCR NEGATIVE  NEGATIVE Final   Comment:            The GeneXpert MRSA Assay (FDA     approved for NASAL specimens     only), is one component of a     comprehensive MRSA colonization     surveillance program. It is not     intended to diagnose MRSA     infection nor to guide or     monitor treatment for  MRSA infections.     Studies: Dg Abd Acute W/chest  05/19/2013   CLINICAL DATA:  constipation  EXAM: ACUTE ABDOMEN SERIES (ABDOMEN 2 VIEW & CHEST 1 VIEW)  COMPARISON:  DG CHEST 1 VIEW dated 05/06/2013; CT ABD - PELV W/ CM dated 04/01/2013; CT ABD - PELV W/ CM dated 05/06/2013  FINDINGS: There is no evidence of dilated bowel loops or free intraperitoneal air. No radiopaque calculi or other significant radiographic abnormality is seen. The cardiac silhouette is enlarged. Both lungs are clear.  IMPRESSION: Negative abdominal radiographs.  No acute cardiopulmonary disease.   Electronically Signed   By: Margaree Mackintosh M.D.   On: 05/19/2013 16:32    Scheduled Meds: . allopurinol  300 mg Oral q morning - 10a  . calcium-vitamin D  1 tablet Oral QPM  . diltiazem  180 mg Oral q morning - 10a  . docusate sodium  100 mg  Oral BID  . gabapentin  400 mg Oral TID  . glipiZIDE  2.5 mg Oral Q breakfast  . insulin aspart  0-15 Units Subcutaneous TID WC  . metoprolol  25 mg Oral BID  . multivitamin with minerals  1 tablet Oral QPM  . mupirocin ointment  1 application Topical TID  . polyethylene glycol  17 g Oral BID  . simvastatin  20 mg Oral QPM  . sodium chloride  3 mL Intravenous Q12H   Continuous Infusions:  Antibiotics Given (last 72 hours)   Date/Time Action Medication Dose   05/19/13 2141 Given   valACYclovir (VALTREX) tablet 500 mg 500 mg   05/20/13 1004 Given   valACYclovir (VALTREX) tablet 500 mg 500 mg      Principal Problem:   Lower GI bleed Active Problems:   Hypertension   Gout   Hyperlipidemia   Deep vein thrombophlebitis of left leg   Prostate cancer   GI bleed   Paroxysmal a-fib    Time spent: [facility] Pager [phone]. If 7PM-7AM, please contact night-coverage at [email or web address], password [facility] 05/20/2013, 11:04 AM  LOS: 1 day

## 2013-05-20 NOTE — Progress Notes (Signed)
This nurse was called to room by CNA, pt moaning from pain to right leg, PRN medication administered as ordered and requested by pt.  Will continue to monitor effectiveness of medication

## 2013-05-20 NOTE — Progress Notes (Signed)
[name] 12:56 PM  Subjective: Patient currently sleeping and difficult to arouse secondary to increased pain medicine secondary to his shingles and his case was discussed with his sister and he has not had any further obvious bleeding Objective: Vital signs stable afebrile abdomen is a little tender and somewhat distended hemoglobin slight decrease other labs are okay  Assessment: Multiple medical problems including radiation proctitis  Plan: I discussed with his sister had we are happy to proceed with a flexible sigmoidoscopy and APC to coagulate the radiation proctitis if bleeding continues and we can even do it as an outpatient and a trial of Rowasa suppositories during times of bleeding would be okay as well and I will check on tomorrow  Jeryl Columbia

## 2013-05-21 DIAGNOSIS — G9340 Encephalopathy, unspecified: Secondary | ICD-10-CM

## 2013-05-21 LAB — GLUCOSE, CAPILLARY
GLUCOSE-CAPILLARY: 76 mg/dL (ref 70–99)
Glucose-Capillary: 89 mg/dL (ref 70–99)
Glucose-Capillary: 126 mg/dL — ABNORMAL HIGH (ref 70–99)
Glucose-Capillary: 90 mg/dL (ref 70–99)

## 2013-05-21 NOTE — Progress Notes (Signed)
PROGRESS NOTE  [name] ATF:[id number] DOB: 06/12/1931 DOA: 05/19/2013 PCP: Leonides Grills, MD  Assessment/Plan:  Painless hematochezia.  due to radiation proctitis. Patient just had sigmoidoscopy show no other source of bleeding about 6 weeks ago.  -treat him with Miralax 2 to 3 times a day as needed to obtain soft bowel movements.  -no need for endoscopic evaluation unless the bleeding does not stop.  -has yet to have a BM today  Shingles  -treated but still painful -will increase neurontin -add lidocaine topical  Hypertension  -Continue metoprolol and Lasix   Diabetes mellitus with neuropathy  -Continue glipizide  -Continue gabapentin for neuropathy  -Will place patient on insulin sliding scale with CBG monitoring  -Last hemoglobin A1c November 2014 6.6    Code Status: DNR Family Communication: family at bedside Disposition Plan: PT eval   Consultants:  GI  Procedures:       HPI/Subjective: Severe leg pain- burning in leg  Objective: Filed Vitals:   05/21/13 0451  BP: 128/90  Pulse: 98  Temp: 98.3 F (36.8 C)  Resp: 16    Intake/Output Summary (Last 24 hours) at 05/21/13 1017 Last data filed at 05/21/13 0300  Gross per 24 hour  Intake    840 ml  Output   1100 ml  Net   -260 ml   Filed Weights   05/20/13 1108  Weight: 97.07 kg (214 lb)    Exam:  General: appears in pain  Cardiovascular: S1 S2 auscultated, no rubs, murmurs or gallops. Regular rate and rhythm.  Respiratory: Clear to auscultation bilaterally with equal chest rise  Abdomen: Soft, obese, nontender, nondistended, + bowel sounds  Neuro: AAOx3, patient will not cooperate with exam due to pain, moves all 4 extremities  Skin: scarring on right thigh  Data Reviewed: Basic Metabolic Panel:  Recent Labs Lab 05/19/13 1448 05/20/13 0630  NA 134* 142  K 4.8 4.4  CL 99 107  CO2 19 23  GLUCOSE 259* 91  BUN 25* 21  CREATININE 0.99 1.10  CALCIUM 10.3 9.6   Liver  Function Tests:  Recent Labs Lab 05/19/13 1448  AST 25  ALT 35  ALKPHOS 53  BILITOT 0.3  PROT 5.5*  ALBUMIN 2.8*   No results found for this basename: LIPASE, AMYLASE,  in the last 168 hours No results found for this basename: AMMONIA,  in the last 168 hours CBC:  Recent Labs Lab 05/19/13 1448 05/20/13 0630  WBC 8.5 7.0  NEUTROABS 7.4  --   HGB 13.0 12.1*  HCT 38.4* 36.4*  MCV 101.9* 104.9*  PLT 151 135*   Cardiac Enzymes: No results found for this basename: CKTOTAL, CKMB, CKMBINDEX, TROPONINI,  in the last 168 hours BNP (last 3 results) No results found for this basename: PROBNP,  in the last 8760 hours CBG:  Recent Labs Lab 05/19/13 2203 05/20/13 0732 05/20/13 1158 05/20/13 1652 05/21/13 0717  GLUCAP 110* 105* 103* 104* 76    Recent Results (from the past 240 hour(s))  MRSA PCR SCREENING     Status: None   Collection Time    05/19/13  7:37 PM      Result Value Ref Range Status   MRSA by PCR NEGATIVE  NEGATIVE Final   Comment:            The GeneXpert MRSA Assay (FDA     approved for NASAL specimens     only), is one component of a     comprehensive MRSA colonization  surveillance program. It is not     intended to diagnose MRSA     infection nor to guide or     monitor treatment for     MRSA infections.     Studies: Dg Abd Acute W/chest  05/19/2013   CLINICAL DATA:  constipation  EXAM: ACUTE ABDOMEN SERIES (ABDOMEN 2 VIEW & CHEST 1 VIEW)  COMPARISON:  DG CHEST 1 VIEW dated 05/06/2013; CT ABD - PELV W/ CM dated 04/01/2013; CT ABD - PELV W/ CM dated 05/06/2013  FINDINGS: There is no evidence of dilated bowel loops or free intraperitoneal air. No radiopaque calculi or other significant radiographic abnormality is seen. The cardiac silhouette is enlarged. Both lungs are clear.  IMPRESSION: Negative abdominal radiographs.  No acute cardiopulmonary disease.   Electronically Signed   By: Margaree Mackintosh M.D.   On: 05/19/2013 16:32    Scheduled Meds: .  allopurinol  300 mg Oral q morning - 10a  . atorvastatin  10 mg Oral q1800  . calcium-vitamin D  1 tablet Oral QPM  . diltiazem  180 mg Oral q morning - 10a  . docusate sodium  100 mg Oral BID  . gabapentin  400 mg Oral TID  . glipiZIDE  2.5 mg Oral Q breakfast  . insulin aspart  0-15 Units Subcutaneous TID WC  . metoprolol  25 mg Oral BID  . multivitamin with minerals  1 tablet Oral QPM  . mupirocin ointment  1 application Topical TID  . polyethylene glycol  17 g Oral BID  . sodium chloride  3 mL Intravenous Q12H   Continuous Infusions:  Antibiotics Given (last 72 hours)   Date/Time Action Medication Dose   05/19/13 2141 Given   valACYclovir (VALTREX) tablet 500 mg 500 mg   05/20/13 1004 Given   valACYclovir (VALTREX) tablet 500 mg 500 mg      Principal Problem:   Lower GI bleed Active Problems:   Hypertension   Gout   Hyperlipidemia   Deep vein thrombophlebitis of left leg   Prostate cancer   GI bleed   Paroxysmal a-fib    Time spent: [facility] Pager [phone]. If 7PM-7AM, please contact night-coverage at [email or web address], password [facility] 05/21/2013, 10:17 AM  LOS: 2 days

## 2013-05-21 NOTE — Progress Notes (Signed)
Pt moaning and grimicing in pain, wife at bedside, PRN pain medication administered, as well as PRN lasix d/t swelling to right leg.  Wife aware of above.  Will continue to monitor swelling and pain

## 2013-05-21 NOTE — Progress Notes (Signed)
Marily Lente Savas 12:23 PM  Subjective: Patient doing better today without signs of obvious bleeding and his case was discussed with a different relative who does think the bleeding has been minimal  Objective: Vital signs stable afebrile no acute distress abdomen minimal discomfort throughout slight distention no repeat hemoglobin  Assessment: Multiple medical problems in a patient with radiation proctitis  Plan: See yesterday's note for details happy to proceed with flexible sigmoidoscopy and APC if needed or trial of Rowasa suppositories and please call us if we could be of any further assistance  Jeryl Columbia

## 2013-05-22 LAB — GLUCOSE, CAPILLARY
GLUCOSE-CAPILLARY: 118 mg/dL — AB (ref 70–99)
Glucose-Capillary: 96 mg/dL (ref 70–99)
Glucose-Capillary: 92 mg/dL (ref 70–99)
Glucose-Capillary: 126 mg/dL — ABNORMAL HIGH (ref 70–99)

## 2013-05-22 MED ORDER — LACTULOSE 10 GM/15ML PO SOLN
20.0000 g | Freq: Every day | ORAL | Status: DC
  Administered 2013-05-22: 20 g via ORAL
  Filled 2013-05-22 (×2): qty 30

## 2013-05-22 MED ORDER — AMITRIPTYLINE HCL 25 MG PO TABS
25.0000 mg | ORAL_TABLET | Freq: Every day | ORAL | Status: DC
  Administered 2013-05-22: 25 mg via ORAL
  Filled 2013-05-22 (×2): qty 1

## 2013-05-22 NOTE — Evaluation (Signed)
Physical Therapy Evaluation Patient Details Name: [name] MRN: [id number] DOB: 06/02/1931 Today's Date: 05/22/2013   History of Present Illness  history of hypertension, hyperlipidemia, left leg DVT, coronary artery disease, history of rectal bleeding, prostate cancer status post radiation, presents emergency department for bright red blood per rectum. Patient states this has been ongoing for approximately 4 days. He had a similar episode in February of 2015 at which point he underwent a sigmoidoscopy which showed bleeding from a dilated capillary, which was due to self disimpaction. Patient was recently admitted on 05/07/2013 weakness and pain control shingles. He is currently taking Valtrex and prednisone. Patient is currently in pain. He only complains of shaking and weakness.  Clinical Impression  Pt with significant impairments in mobility and balance, requiring total A for all activities.  Pt will benefit from skilled PT as well as SNF at d/c to improve strength and decrease burden of care.    Follow Up Recommendations SNF    Equipment Recommendations  None recommended by PT    Recommendations for Other Services       Precautions / Restrictions Precautions Precautions: Fall Restrictions Weight Bearing Restrictions: No      Mobility  Bed Mobility         Supine to sit: Total assist     General bed mobility comments: pt sleeps in a recliner at home  Transfers                 General transfer comment: attempted transfers with bed raised and total A, pt unable to attend to task due to anxiety, unable to stand with total A  Ambulation/Gait                Stairs            Wheelchair Mobility    Modified Rankin (Stroke Patients Only)       Balance Overall balance assessment: Needs assistance   Sitting balance-Leahy Scale: Poor Sitting balance - Comments: pt requires mod/max A to sit edge of bed with B feet supported                                     Pertinent Vitals/Pain Pt c/o pain to touch due to shingles, RN aware.    Home Living Family/patient expects to be discharged to:: Private residence Living Arrangements: Spouse/significant other Available Help at Discharge: Family;Personal care attendant;Available 24 hours/day Type of Home: House Home Access: Ramped entrance     Home Layout: One level Home Equipment: Walker - 2 wheels;Cane - single point;Bedside commode;Wheelchair - manual;Tub bench Additional Comments: pts family member assisted with history    Prior Function Level of Independence: Needs assistance   Gait / Transfers Assistance Needed: pt ambulates short distances with a walker, has a hx of falls...able to transfer independently  ADL's / Homemaking Assistance Needed: assist with bathing and dressing  Comments: pt and wife have multiple people assisting in their care to cover 24 hours/day     Hand Dominance        Extremity/Trunk Assessment   Upper Extremity Assessment: Generalized weakness           Lower Extremity Assessment: Generalized weakness      Cervical / Trunk Assessment: Kyphotic  Communication   Communication: HOH;Expressive difficulties  Cognition Arousal/Alertness: Awake/alert Behavior During Therapy: WFL for tasks assessed/performed Overall Cognitive Status: History of cognitive impairments - at baseline  General Comments      Exercises        Assessment/Plan    PT Assessment Patient needs continued PT services  PT Diagnosis Generalized weakness;Acute pain   PT Problem List Decreased balance;Decreased activity tolerance;Decreased strength;Decreased mobility;Pain;Decreased safety awareness;Decreased cognition  PT Treatment Interventions DME instruction;Therapeutic exercise;Balance training;Neuromuscular re-education;Functional mobility training;Therapeutic activities;Patient/family education;Modalities   PT  Goals (Current goals can be found in the Care Plan section) Acute Rehab PT Goals Patient Stated Goal: none PT Goal Formulation: With patient/family Time For Goal Achievement: 06/05/13 Potential to Achieve Goals: Fair    Frequency Min 2X/week   Barriers to discharge        Co-evaluation               End of Session Equipment Utilized During Treatment: Gait belt Activity Tolerance: Patient limited by fatigue Patient left: in bed;with call bell/phone within reach;with family/visitor present Nurse Communication: Mobility status         Time: 0725-0745 PT Time Calculation (min): 20 min   Charges:   PT Evaluation $Initial PT Evaluation Tier I: 1 Procedure PT Treatments $Therapeutic Activity: 8-22 mins   PT G Codes:          Kennith Gain 05/22/2013, 7:58 AM

## 2013-05-22 NOTE — Clinical Social Work Placement (Addendum)
Clinical Social Work Department CLINICAL SOCIAL WORK PLACEMENT NOTE 05/22/2013  Patient:  JIDENNA, FIGGS  Account Number:  [id number] [place] date:  05/19/2013  Clinical Social Worker:  Raquel Sarna [place], LCSWA  Date/time:  05/22/2013 04:01 PM  Clinical Social Work is seeking post-discharge placement for this patient at the following level of care:   SKILLED NURSING   (*CSW will update this form in Epic as items are completed)   05/22/2013  Patient/family provided with [place] Department of Clinical Social Work's list of facilities offering this level of care within the geographic area requested by the patient (or if unable, by the patient's family).  05/22/2013  Patient/family informed of their freedom to choose among providers that offer the needed level of care, that participate in Medicare, Medicaid or managed care program needed by the patient, have an available bed and are willing to accept the patient.  05/22/2013  Patient/family informed of [facility]' ownership interest in [facility], as well as of the fact that they are under no obligation to receive care at this facility.  PASARR submitted to [facility] on  PASARR number received from [place] on   FL2 transmitted to all facilities in geographic area requested by pt/family on  05/22/2013 FL2 transmitted to all facilities within larger geographic area on   Patient informed that his/her managed care company has contracts with or will negotiate with  certain facilities, including the following:     Patient/family informed of bed offers received:  06/04/2013 Patient chooses bed at  [facility] Physician recommends and patient chooses bed at    Patient to be transferred to [facility] on 06/06/2013   Patient to be transferred to facility by [facility]  The following physician request were entered in Epic:   Additional Comments: PASARR existing  Pati Gallo, [place]  Worker [phone]

## 2013-05-22 NOTE — Progress Notes (Signed)
[facility]  Patient Status: Active (receiving services up to time of hospitalization)  [facility] is providing the following services: RN and PT  If patient discharges after hours, please call [phone].   [name] 05/22/2013, 12:14 PM

## 2013-05-22 NOTE — Clinical Social Work Psychosocial (Signed)
Clinical Social Work Department BRIEF PSYCHOSOCIAL ASSESSMENT 05/22/2013  Patient:  JARON, CZARNECKI     Account Number:  [id number]     Admit date:  05/19/2013  Clinical Social Worker:  Donna Christen  Date/Time:  05/22/2013 03:57 PM  Referred by:  Physician  Date Referred:  05/22/2013 Referred for  SNF Placement   Other Referral:   none.   Interview type:  Family Other interview type:   CSW spoke with pt's siblings at bedside while pt slept.    PSYCHOSOCIAL DATA Living Status:  WIFE Admitted from facility:   Level of care:   Primary support name:  Ferdinand Revoir Primary support relationship to patient:  SIBLING Degree of support available:   Strong support system.    CURRENT CONCERNS Current Concerns  Post-Acute Placement   Other Concerns:   none.    SOCIAL WORK ASSESSMENT / PLAN CSW met with pt's siblings at bedside to discuss discharge disposition. Per family, pt would prefer to return home with [facility] (previously working with prior to admission). Pt's family agreeable to SNF search in [facility] as PT recommended SNF placement at discharge. CSW has updated RNCM regarding possible discharge home with home health services.   Assessment/plan status:  Psychosocial Support/Ongoing Assessment of Needs Other assessment/ plan:   none   Information/referral to community resources:   [facility]. RNCM for home health services.    PATIENTS/FAMILYS RESPONSE TO PLAN OF CARE: Pt's family understanding and agreeable to CSW plan of care. Per pt's brother, pt and pt's family will be able to "manage" pt at home "if he [pt] can stand." Pt's brother informed CSW of family caring for pt at home prior to admission to [facility].       Pati Gallo, [place] Social Worker [phone]

## 2013-05-22 NOTE — Progress Notes (Signed)
PROGRESS NOTE  [name] GEX:[id number] DOB: [birth year] DOA: 05/19/2013 PCP: Leonides Grills, MD  Assessment/Plan:  Painless hematochezia.  due to radiation proctitis. Patient just had sigmoidoscopy show no other source of bleeding about 6 weeks ago.  -treat him with Miralax 2 to 3 times a day as needed to obtain soft bowel movements.  -no need for endoscopic evaluation unless the bleeding does not stop.  -has yet to have a BM today  Shingles  -treated but still painful -will increase neurontin -add lidocaine topical -add amytrptyline   Hypertension  -Continue metoprolol and Lasix   Diabetes mellitus with neuropathy  -Continue glipizide  -Continue gabapentin for neuropathy  -Will place patient on insulin sliding scale with CBG monitoring  -Last hemoglobin A1c November 2014 6.6    Code Status: DNR Family Communication: family at bedside Disposition Plan: PT eval   Consultants:  GI  Procedures:       HPI/Subjective: Still having pain Slightly improved with cream  Objective: Filed Vitals:   05/22/13 0515  BP: 110/58  Pulse: 89  Temp: 99.7 F (37.6 C)  Resp: 16    Intake/Output Summary (Last 24 hours) at 05/22/13 1028 Last data filed at 05/22/13 0916  Gross per 24 hour  Intake    720 ml  Output   2850 ml  Net  -2130 ml   Filed Weights   05/20/13 1108  Weight: 97.07 kg (214 lb)    Exam:  General: appears in pain  Cardiovascular: S1 S2 auscultated, no rubs, murmurs or gallops. Regular rate and rhythm.  Respiratory: Clear to auscultation bilaterally with equal chest rise  Abdomen: Soft, obese, nontender, nondistended, + bowel sounds  Neuro: AAOx3, patient will not cooperate with exam due to pain, moves all 4 extremities  Skin: scarring on right thigh  Data Reviewed: Basic Metabolic Panel:  Recent Labs Lab 05/19/13 1448 05/20/13 0630  NA 134* 142  K 4.8 4.4  CL 99 107  CO2 19 23  GLUCOSE 259* 91  BUN 25* 21  CREATININE 0.99  1.10  CALCIUM 10.3 9.6   Liver Function Tests:  Recent Labs Lab 05/19/13 1448  AST 25  ALT 35  ALKPHOS 53  BILITOT 0.3  PROT 5.5*  ALBUMIN 2.8*   No results found for this basename: LIPASE, AMYLASE,  in the last 168 hours No results found for this basename: AMMONIA,  in the last 168 hours CBC:  Recent Labs Lab 05/19/13 1448 05/20/13 0630  WBC 8.5 7.0  NEUTROABS 7.4  --   HGB 13.0 12.1*  HCT 38.4* 36.4*  MCV 101.9* 104.9*  PLT 151 135*   Cardiac Enzymes: No results found for this basename: CKTOTAL, CKMB, CKMBINDEX, TROPONINI,  in the last 168 hours BNP (last 3 results) No results found for this basename: PROBNP,  in the last 8760 hours CBG:  Recent Labs Lab 05/21/13 0717 05/21/13 1149 05/21/13 1658 05/21/13 2219 05/22/13 0850  GLUCAP 76 89 126* 90 96    Recent Results (from the past 240 hour(s))  MRSA PCR SCREENING     Status: None   Collection Time    05/19/13  7:37 PM      Result Value Ref Range Status   MRSA by PCR NEGATIVE  NEGATIVE Final   Comment:            The GeneXpert MRSA Assay (FDA     approved for NASAL specimens     only), is one component of a     comprehensive  MRSA colonization     surveillance program. It is not     intended to diagnose MRSA     infection nor to guide or     monitor treatment for     MRSA infections.     Studies: No results found.  Scheduled Meds: . allopurinol  300 mg Oral q morning - 10a  . atorvastatin  10 mg Oral q1800  . calcium-vitamin D  1 tablet Oral QPM  . diltiazem  180 mg Oral q morning - 10a  . docusate sodium  100 mg Oral BID  . gabapentin  400 mg Oral TID  . glipiZIDE  2.5 mg Oral Q breakfast  . insulin aspart  0-15 Units Subcutaneous TID WC  . metoprolol  25 mg Oral BID  . multivitamin with minerals  1 tablet Oral QPM  . mupirocin ointment  1 application Topical TID  . polyethylene glycol  17 g Oral BID  . sodium chloride  3 mL Intravenous Q12H   Continuous Infusions:  Antibiotics Given  (last 72 hours)   Date/Time Action Medication Dose   05/19/13 2141 Given   valACYclovir (VALTREX) tablet 500 mg 500 mg   05/20/13 1004 Given   valACYclovir (VALTREX) tablet 500 mg 500 mg      Principal Problem:   Lower GI bleed Active Problems:   Hypertension   Gout   Hyperlipidemia   Deep vein thrombophlebitis of left leg   Prostate cancer   GI bleed   Paroxysmal a-fib    Time spent: [place] [facility] Pager [phone]. If 7PM-7AM, please contact night-coverage at [email or web address], password [email or web address] 05/22/2013, 10:28 AM  LOS: 3 days

## 2013-05-23 ENCOUNTER — Inpatient Hospital Stay (HOSPITAL_COMMUNITY): Payer: Medicare Other

## 2013-05-23 DIAGNOSIS — R4182 Altered mental status, unspecified: Secondary | ICD-10-CM

## 2013-05-23 LAB — URINALYSIS, ROUTINE W REFLEX MICROSCOPIC
Bilirubin Urine: NEGATIVE
GLUCOSE, UA: NEGATIVE mg/dL
Hgb urine dipstick: NEGATIVE
KETONES UR: NEGATIVE mg/dL
Leukocytes, UA: NEGATIVE
Nitrite: NEGATIVE
Protein, ur: NEGATIVE mg/dL
Specific Gravity, Urine: 1.019 (ref 1.005–1.030)
Urobilinogen, UA: 0.2 mg/dL (ref 0.0–1.0)
pH: 6 (ref 5.0–8.0)

## 2013-05-23 LAB — GLUCOSE, CAPILLARY
GLUCOSE-CAPILLARY: 121 mg/dL — AB (ref 70–99)
Glucose-Capillary: 86 mg/dL (ref 70–99)
Glucose-Capillary: 94 mg/dL (ref 70–99)
Glucose-Capillary: 142 mg/dL — ABNORMAL HIGH (ref 70–99)

## 2013-05-23 LAB — BASIC METABOLIC PANEL
BUN: 16 mg/dL (ref 6–23)
CO2: 25 mEq/L (ref 19–32)
Calcium: 9.4 mg/dL (ref 8.4–10.5)
Chloride: 101 mEq/L (ref 96–112)
Creatinine, Ser: 0.95 mg/dL (ref 0.50–1.35)
GFR, EST AFRICAN AMERICAN: 89 mL/min — AB (ref >90)
GFR, EST NON AFRICAN AMERICAN: 77 mL/min — AB (ref >90)
Glucose, Bld: 86 mg/dL (ref 70–99)
Potassium: 3.7 mEq/L (ref 3.7–5.3)
SODIUM: 140 meq/L (ref 137–147)

## 2013-05-23 LAB — CBC
HCT: 37.5 % — ABNORMAL LOW (ref 39.0–52.0)
HEMOGLOBIN: 12.4 g/dL — AB (ref 13.0–17.0)
MCH: 34.4 pg — ABNORMAL HIGH (ref 26.0–34.0)
MCHC: 33.1 g/dL (ref 30.0–36.0)
MCV: 104.2 fL — ABNORMAL HIGH (ref 78.0–100.0)
Platelets: 133 10*3/uL — ABNORMAL LOW (ref 150–400)
RBC: 3.6 MIL/uL — ABNORMAL LOW (ref 4.22–5.81)
RDW: 17.5 % — ABNORMAL HIGH (ref 11.5–15.5)
WBC: 8.2 10*3/uL (ref 4.0–10.5)

## 2013-05-23 MED ORDER — LACTULOSE 10 GM/15ML PO SOLN
20.0000 g | Freq: Two times a day (BID) | ORAL | Status: DC
  Administered 2013-05-23 – 2013-05-30 (×10): 20 g via ORAL
  Filled 2013-05-23 (×16): qty 30

## 2013-05-23 MED ORDER — GABAPENTIN 300 MG PO CAPS
300.0000 mg | ORAL_CAPSULE | Freq: Three times a day (TID) | ORAL | Status: DC
  Administered 2013-05-23 – 2013-05-27 (×11): 300 mg via ORAL
  Filled 2013-05-23 (×17): qty 1

## 2013-05-23 MED ORDER — ACETAMINOPHEN 325 MG PO TABS
650.0000 mg | ORAL_TABLET | Freq: Four times a day (QID) | ORAL | Status: DC | PRN
  Administered 2013-05-23 – 2013-06-02 (×3): 650 mg via ORAL
  Filled 2013-05-23 (×3): qty 2

## 2013-05-23 MED ORDER — AMITRIPTYLINE HCL 25 MG PO TABS
12.5000 mg | ORAL_TABLET | Freq: Every day | ORAL | Status: DC
  Administered 2013-05-23 – 2013-06-05 (×10): 12.5 mg via ORAL
  Filled 2013-05-23 (×2): qty 0.5
  Filled 2013-05-23: qty 1
  Filled 2013-05-23: qty 0.5
  Filled 2013-05-23 (×2): qty 1
  Filled 2013-05-23 (×5): qty 0.5
  Filled 2013-05-23 (×2): qty 1
  Filled 2013-05-23 (×6): qty 0.5

## 2013-05-23 MED ORDER — LIDOCAINE 5 % EX PTCH
1.0000 | MEDICATED_PATCH | CUTANEOUS | Status: DC
  Administered 2013-05-23 – 2013-06-06 (×15): 1 via TRANSDERMAL
  Filled 2013-05-23 (×15): qty 1

## 2013-05-23 NOTE — Progress Notes (Addendum)
PROGRESS NOTE  [name] DOB: 12/11/1931 DOA: 05/19/2013 PCP: Scott Grills, MD   [name] is a 80 y.o. male  With a history of hypertension, hyperlipidemia, left leg DVT, coronary artery disease, history of rectal bleeding, prostate cancer status post radiation, presents emergency department for bright red blood per rectum. Patient states this has been ongoing for approximately 4 days. He had a similar episode in February of 2015 at which point he underwent a sigmoidoscopy which showed bleeding from a dilated capillary, which was due to self disimpaction. Patient was recently admitted on 05/07/2013 weakness and pain control shingles. He is currently taking Valtrex and prednisone. Patient is currently in pain. He only complains of shaking and weakness.   Assessment/Plan:  Painless hematochezia.  due to radiation proctitis. Patient just had sigmoidoscopy show no other source of bleeding about 6 weeks ago.  -treat him with Miralax 2 to 3 times a day as needed to obtain soft bowel movements.  -no need for endoscopic evaluation unless the bleeding does not stop.  -has yet to have a BM - giving PRNS as tolerated  Fever -check U/A Chest x ray -blood culture x 2  Shingles  -treated but still painful -trying to minimize narcotics as patient it makes patient lethargic -add lidocaine patch -add amytrptyline -neurontin PO   Hypertension  -Continue metoprolol and Lasix   Diabetes mellitus with neuropathy  -Continue glipizide  -Continue gabapentin for neuropathy  -Will place patient on insulin sliding scale with CBG monitoring  -Last hemoglobin A1c November 2014 6.6  Baseline at home appears to be sitting in chair and pivoting to toilet ?AMS here- prob multifactorial -will get neuro consult and MRI with and without contrast    Code Status: DNR Family Communication: family at bedside Disposition Plan: PT eval- recs SNF but family has 24 hour care givers  and want to take home   Consultants:  GI  Procedures:    HPI/Subjective: Pain slightly better controlled Answers questions but does not open eyes  Objective: Filed Vitals:   05/23/13 0632  BP: 127/62  Pulse: 92  Temp: 98 F (36.7 C)  Resp: 18    Intake/Output Summary (Last 24 hours) at 05/23/13 0950 Last data filed at 05/23/13 0850  Gross per 24 hour  Intake    240 ml  Output   1625 ml  Net  -1385 ml   Filed Weights   05/20/13 1108  Weight: 97.07 kg (214 lb)    Exam:  General: resting Cardiovascular: S1 S2 auscultated, no rubs, murmurs or gallops. Regular rate and rhythm.  Respiratory: Clear to auscultation bilaterally with equal chest rise  Abdomen: Soft, obese, nontender, nondistended, + bowel sounds  Neuro: moves all 4 ext- eyes closed Skin: scarring on right thigh  Data Reviewed: Basic Metabolic Panel:  Recent Labs Lab 05/19/13 1448 05/20/13 0630 05/23/13 0546  NA 134* 142 140  K 4.8 4.4 3.7  CL 99 107 101  CO2 19 23 25   GLUCOSE 259* 91 86  BUN 25* 21 16  CREATININE 0.99 1.10 0.95  CALCIUM 10.3 9.6 9.4   Liver Function Tests:  Recent Labs Lab 05/19/13 1448  AST 25  ALT 35  ALKPHOS 53  BILITOT 0.3  PROT 5.5*  ALBUMIN 2.8*   No results found for this basename: LIPASE, AMYLASE,  in the last 168 hours No results found for this basename: AMMONIA,  in the last 168 hours CBC:  Recent Labs Lab 05/19/13 1448 05/20/13 0630 05/23/13  0546  WBC 8.5 7.0 8.2  NEUTROABS 7.4  --   --   HGB 13.0 12.1* 12.4*  HCT 38.4* 36.4* 37.5*  MCV 101.9* 104.9* 104.2*  PLT 151 135* 133*   Cardiac Enzymes: No results found for this basename: CKTOTAL, CKMB, CKMBINDEX, TROPONINI,  in the last 168 hours BNP (last 3 results) No results found for this basename: PROBNP,  in the last 8760 hours CBG:  Recent Labs Lab 05/21/13 2219 05/22/13 0850 05/22/13 1204 05/22/13 1653 05/22/13 2214  GLUCAP 90 96 92 118* 126*    Recent Results (from the past  240 hour(s))  MRSA PCR SCREENING     Status: None   Collection Time    05/19/13  7:37 PM      Result Value Ref Range Status   MRSA by PCR NEGATIVE  NEGATIVE Final   Comment:            The GeneXpert MRSA Assay (FDA     approved for NASAL specimens     only), is one component of a     comprehensive MRSA colonization     surveillance program. It is not     intended to diagnose MRSA     infection nor to guide or     monitor treatment for     MRSA infections.     Studies: No results found.  Scheduled Meds: . allopurinol  300 mg Oral q morning - 10a  . amitriptyline  12.5 mg Oral QHS  . atorvastatin  10 mg Oral q1800  . calcium-vitamin D  1 tablet Oral QPM  . diltiazem  180 mg Oral q morning - 10a  . docusate sodium  100 mg Oral BID  . gabapentin  300 mg Oral TID  . glipiZIDE  2.5 mg Oral Q breakfast  . insulin aspart  0-15 Units Subcutaneous TID WC  . lactulose  20 g Oral Daily  . metoprolol  25 mg Oral BID  . multivitamin with minerals  1 tablet Oral QPM  . mupirocin ointment  1 application Topical TID  . polyethylene glycol  17 g Oral BID  . sodium chloride  3 mL Intravenous Q12H   Continuous Infusions:  Antibiotics Given (last 72 hours)   Date/Time Action Medication Dose   05/20/13 1004 Given   valACYclovir (VALTREX) tablet 500 mg 500 mg      Principal Problem:   Lower GI bleed Active Problems:   Hypertension   Gout   Hyperlipidemia   Deep vein thrombophlebitis of left leg   Prostate cancer   GI bleed   Paroxysmal a-fib    Time spent: [place] [facility] Pager [phone]. If 7PM-7AM, please contact night-coverage at [email or web address], password [facility] 05/23/2013, 9:50 AM  LOS: 4 days

## 2013-05-23 NOTE — Telephone Encounter (Signed)
Last tcs done by [facility] was in 2005

## 2013-05-23 NOTE — Care Management Note (Signed)
  Page 1 of 1   05/23/2013     9:33:26 AM CARE MANAGEMENT NOTE 05/23/2013  Patient:  [name], [name]   Account Number:  [id number]  Date Initiated:  05/23/2013  Documentation initiated by:  Magdalen Spatz  Subjective/Objective Assessment:     Action/Plan:   Anticipated DC Date:     Anticipated DC Plan:  [place]         Choice offered to / List presented to:          [facility] arranged  HH-1 RN  [place].   Status of service:  Completed, signed off Medicare Important Message given?   (If response is "NO", the following Medicare IM given date fields will be blank) Date Medicare IM given:   Date Additional Medicare IM given:    Discharge Disposition:    Per UR Regulation:    If discussed at Long Length of Stay Meetings, dates discussed:    Comments:  05-23-13 [facility] with [facility] prior to admission , family would like to continue with [facility] . Mary with [place] aware. Magdalen Spatz RN BSN [phone]

## 2013-05-23 NOTE — Telephone Encounter (Signed)
Late entry- this note came through on a day that I left early and was out the next day as well. Checked pts chart yesterday and pt had been admitted to [facility] and was currently an inpatient. Per MD notes , pt is going to be transferred to SNF after discharge.

## 2013-05-23 NOTE — Progress Notes (Signed)
Patient able to take medications with much coaxing from nurse and brother.  Patient keeps eyes closed entire time.  Brother fed patient breakfast and states he ate approximately 50%.

## 2013-05-23 NOTE — Consult Note (Signed)
NEURO HOSPITALIST CONSULT NOTE    Reason for Consult : AMS  HPI:                                                                                                                                          [name] is an 80 y.o. male who was diagnosed with Herpes Zoster 4 weeks ago. He is currently taking Valtrex and prednisone. Patient was admitted for BRBPR and over the hospital stay he has had a mental decline.  Getting a good history from family was difficult.  What I could glean is that patient does not live alone, cannot care for himself at baseline, cannot cook and does not walk. Per brother, he is able to hold a fairly normal conversation. Currently patient is confused and unable to answer my questions.  He is agitated when aroused and tends to hold his eyes shut.  While in hospital he has not had a temperature but today had a temperature 102. WBC 8.2   Past Medical History  Diagnosis Date  . Hypertension   . Gout     Acute episode involving ankle in 12/2011  . Hyperlipidemia   . Deep vein thrombophlebitis of left leg 11/2010    LLE; S/P THA  . Fasting hyperglycemia   . Stroke 08/2009; 07/2010    TIA in 07/2010  . H/O heat stroke 08/2009  . Prostate cancer 2013    adenocarcinoma; Gleason grade 8; PSA 9.45; EBRT 2013-14  . Renal cyst   . Syncope     01/2012: Negative pharmacologic stress nuclear  . PAF (paroxysmal atrial fibrillation)     a. first noted 01/2012 during admission for URI;  b. seen again 03/2012 after nstemi of unknown origin - pradaxa initiated.  Marland Kitchen CAD (coronary artery disease)     a. 01/2012 Elev Ti-->nonischemic MV;  b. 03/2012 NSTEMI/Cath: LM 50, LAD 50p/m, 32m/d, LCX nl, RI 60ost sup branch, RCA diff nonobs, EF 55%  . Radiation proctitis 02/2013  . NSTEMI (non-ST elevated myocardial infarction) 02/2013  . Diabetes mellitus without complication     Past Surgical History  Procedure Laterality Date  . Hip arthroplasty  12/08/2010   ARTHROPLASTY BIPOLAR HIP;  Surgeon-Olin; Left  . 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 I&d extremity  05/28/2011    Procedure: IRRIGATION AND DEBRIDEMENT EXTREMITY;  Surgeon: Linna Hoff, MD;  Location: [facility];  Service: Orthopedics;  Laterality: Right;  I & D Right hand/wrist  . Colonoscopy   07/18/2003    RMR: Rectum internal hemorrhoids.  Otherwise, normal rectum/ Left-sided diverticula.  Remainder of colonic mucosa-nl  . Flexible sigmoidoscopy N/A 03/18/2013    Procedure: FLEXIBLE SIGMOIDOSCOPY;  Surgeon: Kenney Houseman  Penelope Coop, MD;  Location: [facility];  Service: Endoscopy;  Laterality: N/A;  . Cardiac catheterization  03/2012    non-obstructive ASCAD, medial management only, pt does not want repeat cath    Family History  Problem Relation Age of Onset  . Prostate cancer Brother   . Colon cancer Sister 78  . Diabetes Mellitus II Father   . Stroke Father     Social History:  reports that he quit smoking about 65 years ago. His smoking use included Cigarettes and Cigars. He smoked 0.00 packs per day. His smokeless tobacco use includes Chew. He reports that he does not drink alcohol or use illicit drugs.  Allergies  Allergen Reactions  . Tetanus Toxoids Swelling    Also allergic to "high powered pain medications."  . Oxycodone     Makes him loopy    MEDICATIONS:                                                                                                                     Scheduled: . allopurinol  300 mg Oral q morning - 10a  . amitriptyline  12.5 mg Oral QHS  . atorvastatin  10 mg Oral q1800  . calcium-vitamin D  1 tablet Oral QPM  . diltiazem  180 mg Oral q morning - 10a  . docusate sodium  100 mg Oral BID  . gabapentin  300 mg Oral TID  . glipiZIDE  2.5 mg Oral Q breakfast  . insulin aspart  0-15 Units Subcutaneous TID WC  . lactulose  20 g Oral BID  . lidocaine  1 patch Transdermal Q24H  . metoprolol  25 mg Oral BID  . multivitamin  with minerals  1 tablet Oral QPM  . mupirocin ointment  1 application Topical TID  . polyethylene glycol  17 g Oral BID  . sodium chloride  3 mL Intravenous Q12H     ROS:                                                                                                                                       History obtained from the patient  General ROS: negative for - chills, fatigue, fever, night sweats, weight gain or weight loss Psychological ROS: negative for - behavioral disorder, hallucinations, memory difficulties, mood swings or suicidal ideation Ophthalmic ROS: negative for - blurry vision, double vision, eye pain or loss of vision ENT ROS:  negative for - epistaxis, nasal discharge, oral lesions, sore throat, tinnitus or vertigo Allergy and Immunology ROS: negative for - hives or itchy/watery eyes Hematological and Lymphatic ROS: negative for - bleeding problems, bruising or swollen lymph nodes Endocrine ROS: negative for - galactorrhea, hair pattern changes, polydipsia/polyuria or temperature intolerance Respiratory ROS: negative for - cough, hemoptysis, shortness of breath or wheezing Cardiovascular ROS: negative for - chest pain, dyspnea on exertion, edema or irregular heartbeat Gastrointestinal ROS: negative for - abdominal pain, diarrhea, hematemesis, nausea/vomiting or stool incontinence Genito-Urinary ROS: negative for - dysuria, hematuria, incontinence or urinary frequency/urgency Musculoskeletal ROS: negative for - joint swelling or muscular weakness Neurological ROS: as noted in HPI Dermatological ROS: negative for rash and skin lesion changes   Blood pressure 127/62, pulse 92, temperature 98 F (36.7 C), temperature source Axillary, resp. rate 18, height 5\' 8"  (1.727 m), weight 97.07 kg (214 lb), SpO2 96.00%.   Neurologic Examination:                                                                                                      Mental Status: Alert, not oriented  to time, place, date, thought content not appropriate.  Holds his eyes shut.  Speech fluent without evidence of aphasia.  Not able to follow 3 step commands. Cranial Nerves: II: Discs flat bilaterally; blinks to threat bilaterally, pupils equal, round, reactive to light and accommodation III,IV, VI: will not open eyes but shows good strength forcing his eyes shut, eyes disconjugate when opened, right eye did not deviate fully laterally and left eye did not deviate fully medially.  V,VII: smile symmetric, facial light touch sensation normal bilaterally VIII: hearing normal bilaterally IX,X: gag reflex present XI: bilateral shoulder shrug XII: midline tongue extension without atrophy or fasciculations  Motor:--exam difficult as patient would not take part.  Right : Upper extremity   4/5    Left:     Upper extremity   4/5  Lower extremity   3/5     Lower extremity   3/5 Tone and bulk:normal tone throughout; no atrophy noted Sensory: Pinprick and light touch intact throughout, bilaterally Deep Tendon Reflexes:  Right: Upper Extremity   Left: Upper extremity   biceps (C-5 to C-6) 2/4   biceps (C-5 to C-6) 2/4 tricep (C7) 2/4    triceps (C7) 2/4 Brachioradialis (C6) 2/4  Brachioradialis (C6) 2/4  Lower Extremity Lower Extremity  quadriceps (L-2 to L-4) 1/4   quadriceps (L-2 to L-4) 1/4 Achilles (S1) 0/4   Achilles (S1) 0/4  Plantars: Mute bilaterally Cerebellar: Would not take part in testing Gait: not able to assess due to mental status CV: pulses palpable throughout    Lab Results: Basic Metabolic Panel:  Recent Labs Lab 05/19/13 1448 05/20/13 0630 05/23/13 0546  NA 134* 142 140  K 4.8 4.4 3.7  CL 99 107 101  CO2 19 23 25   GLUCOSE 259* 91 86  BUN 25* 21 16  CREATININE 0.99 1.10 0.95  CALCIUM 10.3 9.6 9.4    Liver Function Tests:  Recent Labs Lab 05/19/13 1448  AST 25  ALT 35  ALKPHOS 53  BILITOT 0.3  PROT 5.5*  ALBUMIN 2.8*   No results found for this  basename: LIPASE, AMYLASE,  in the last 168 hours No results found for this basename: AMMONIA,  in the last 168 hours  CBC:  Recent Labs Lab 05/19/13 1448 05/20/13 0630 05/23/13 0546  WBC 8.5 7.0 8.2  NEUTROABS 7.4  --   --   HGB 13.0 12.1* 12.4*  HCT 38.4* 36.4* 37.5*  MCV 101.9* 104.9* 104.2*  PLT 151 135* 133*    Cardiac Enzymes: No results found for this basename: CKTOTAL, CKMB, CKMBINDEX, TROPONINI,  in the last 168 hours  Lipid Panel: No results found for this basename: CHOL, TRIG, HDL, CHOLHDL, VLDL, LDLCALC,  in the last 168 hours  CBG:  Recent Labs Lab 05/21/13 2219 05/22/13 0850 05/22/13 1204 05/22/13 1653 05/22/13 2214  GLUCAP 90 96 92 118* 126*    Microbiology: Results for orders placed during the hospital encounter of 05/19/13  MRSA PCR SCREENING     Status: None   Collection Time    05/19/13  7:37 PM      Result Value Ref Range Status   MRSA by PCR NEGATIVE  NEGATIVE Final   Comment:            The GeneXpert MRSA Assay (FDA     approved for NASAL specimens     only), is one component of a     comprehensive MRSA colonization     surveillance program. It is not     intended to diagnose MRSA     infection nor to guide or     monitor treatment for     MRSA infections.    Coagulation Studies: No results found for this basename: LABPROT, INR,  in the last 72 hours  Imaging: No results found.     Assessment and plan per attending neurologist  Etta Quill PA-C [facility] [phone]  05/23/2013, 1:53 PM   Assessment/Plan: 80 YO male currently being treated for HSV zoster.  Patient has shown a progressive mental decline over the past 4 days. Although all his temperatures have been normal a rectal temperature today showed 102. There is concern for possible herpes encephalitis due to fairly sudden onset of confusion.    Recommend: 1) MRI brain with and without contrast.  2) If MRI is negative LP by fluroscopy 3) Blood  cultures, CXR, Consider ID consult      Patient seen and examined together with physician assistant and I concur with the assessment and plan.  Dorian Pod, MD

## 2013-05-24 DIAGNOSIS — I2510 Atherosclerotic heart disease of native coronary artery without angina pectoris: Secondary | ICD-10-CM

## 2013-05-24 LAB — GLUCOSE, CAPILLARY
GLUCOSE-CAPILLARY: 96 mg/dL (ref 70–99)
Glucose-Capillary: 98 mg/dL (ref 70–99)
Glucose-Capillary: 107 mg/dL — ABNORMAL HIGH (ref 70–99)
Glucose-Capillary: 98 mg/dL (ref 70–99)

## 2013-05-24 NOTE — Progress Notes (Signed)
PROGRESS NOTE  [name] IWP:[id number] DOB: 11/16/1931 DOA: 05/19/2013 PCP: Scott Grills, MD   [name] is a 80 y.o. male  With a history of hypertension, hyperlipidemia, left leg DVT, coronary artery disease, history of rectal bleeding, prostate cancer status post radiation, presents emergency department for bright red blood per rectum. Patient states this has been ongoing for approximately 4 days. He had a similar episode in February of 2015 at which point he underwent a sigmoidoscopy which showed bleeding from a dilated capillary, which was due to self disimpaction. Patient was recently admitted on 05/07/2013 weakness and pain control shingles. He is currently taking Valtrex and prednisone. Patient is currently in pain. He only complains of shaking and weakness.   Assessment/Plan:  Painless hematochezia.  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 S/p sigmoidoscopy show no other source of bleeding about 6 weeks ago.  -started Miralax 2 to 3 times a day  -no need for endoscopic evaluation unless the bleeding does not stop.   Fever -check U/A clear -Chest x ray unremarkable -blood culture x 2 - prelim neg thus far  Shingles  -treated but still painful -trying to minimize narcotics as patient it makes patient lethargic -added lidocaine patch -added amytrptyline -neurontin PO   Hypertension  -Continue metoprolol and Lasix   Diabetes mellitus with neuropathy  -Continue glipizide  -Continue gabapentin for neuropathy  -Will place patient on insulin sliding scale with CBG monitoring  -Last hemoglobin A1c November 2014 6.6  Baseline at home appears to be sitting in chair and pivoting to toilet ?AMS here- prob multifactorial -Neurology consulted -Recommended MRI unremarkable -This AM, Pt is much more awake and alert per family. -LP was recommended by Neuro if MRI neg - will defer this to Neurology   Code Status: DNR Family Communication: family at  bedside Disposition Plan: PT eval- recs SNF but family has 24 hour care givers and want to take home   Consultants:  GI  Neurology  Procedures:  MRI brain 4/28  HPI/Subjective: No major issues overnight. Pt noted to be more awake and alert this AM  Objective: Filed Vitals:   05/24/13 0705  BP:   Pulse:   Temp: 99.1 F (37.3 C)  Resp:     Intake/Output Summary (Last 24 hours) at 05/24/13 0824 Last data filed at 05/24/13 0458  Gross per 24 hour  Intake    240 ml  Output    650 ml  Net   -410 ml   Filed Weights   05/20/13 1108  Weight: 97.07 kg (214 lb)    Exam:  General: easily arousable, in nad Cardiovascular: S1 S2 auscultated, no rubs, murmurs or gallops. Regular rate and rhythm.  Respiratory: Clear to auscultation bilaterally with equal chest rise  Abdomen: Soft, obese, nontender, nondistended, + bowel sounds  Neuro: moves all 4 ext- eyes closed Skin: scarring on right thigh  Data Reviewed: Basic Metabolic Panel:  Recent Labs Lab 05/19/13 1448 05/20/13 0630 05/23/13 0546  NA 134* 142 140  K 4.8 4.4 3.7  CL 99 107 101  CO2 19 23 25   GLUCOSE 259* 91 86  BUN 25* 21 16  CREATININE 0.99 1.10 0.95  CALCIUM 10.3 9.6 9.4   Liver Function Tests:  Recent Labs Lab 05/19/13 1448  AST 25  ALT 35  ALKPHOS 53  BILITOT 0.3  PROT 5.5*  ALBUMIN 2.8*   No results found for this basename: LIPASE, AMYLASE,  in the last 168 hours No results found for this basename:  AMMONIA,  in the last 168 hours CBC:  Recent Labs Lab 05/19/13 1448 05/20/13 0630 05/23/13 0546  WBC 8.5 7.0 8.2  NEUTROABS 7.4  --   --   HGB 13.0 12.1* 12.4*  HCT 38.4* 36.4* 37.5*  MCV 101.9* 104.9* 104.2*  PLT 151 135* 133*   Cardiac Enzymes: No results found for this basename: CKTOTAL, CKMB, CKMBINDEX, TROPONINI,  in the last 168 hours BNP (last 3 results) No results found for this basename: PROBNP,  in the last 8760 hours CBG:  Recent Labs Lab 05/23/13 0740  05/23/13 1251 05/23/13 1643 05/23/13 2148 05/24/13 0717  GLUCAP 86 94 142* 121* 96    Recent Results (from the past 240 hour(s))  MRSA PCR SCREENING     Status: None   Collection Time    05/19/13  7:37 PM      Result Value Ref Range Status   MRSA by PCR NEGATIVE  NEGATIVE Final   Comment:            The GeneXpert MRSA Assay (FDA     approved for NASAL specimens     only), is one component of a     comprehensive MRSA colonization     surveillance program. It is not     intended to diagnose MRSA     infection nor to guide or     monitor treatment for     MRSA infections.  CULTURE, BLOOD (ROUTINE X 2)     Status: None   Collection Time    05/23/13  4:25 PM      Result Value Ref Range Status   Specimen Description BLOOD RIGHT HAND   Final   Special Requests BOTTLES DRAWN AEROBIC ONLY 10CC   Final   Culture  Setup Time     Final   Value: 05/23/2013 22:25     Performed at [facility]   Culture     Final   Value:        BLOOD CULTURE RECEIVED NO GROWTH TO DATE CULTURE WILL BE HELD FOR 5 DAYS BEFORE ISSUING A FINAL NEGATIVE REPORT     Performed at [facility]   Report Status PENDING   Incomplete     Studies: Mr Brain Wo Contrast  05/23/2013   CLINICAL DATA:  Altered mental status. Recent herpes zoster infection. Hypertension. Diabetic. Prostate cancer.  EXAM: MRI HEAD WITHOUT CONTRAST  TECHNIQUE: Multiplanar, multiecho pulse sequences of the brain and surrounding structures were obtained without intravenous contrast.  COMPARISON:  05/07/2013 head CT.  11/21/2012 brain MR.  FINDINGS: Exam is motion degraded.  No acute infarct.  No MR evidence of herpes encephalitis. If this remains of high clinical concern correlation with cerebral spinal fluid analysis may be considered.  Atrophy. Ventricular prominence may reflect superimposed mild hydrocephalus and is without change.  Mild periventricular nonspecific white matter type changes may represent result of small vessel  disease although subependymal reabsorption of fluid not entirely excluded. Appearance unchanged.  No intracranial hemorrhage.  Small right vertebral artery once again noted which may predominantly end in a posterior inferior cerebellar artery distribution.  Partially empty sella unchanged and probably an incidental finding.  Mild polypoid opacification inferior aspect maxillary sinuses. Minimal mucosal thickening remainder of paranasal sinuses.  Persistent partial opacification right mastoid air cells.  IMPRESSION: Exam is motion degraded.  No acute infarct.  No MR evidence of herpes encephalitis.  Atrophy. Ventricular prominence may reflect superimposed mild hydrocephalus and is without change.  Mild  periventricular nonspecific white matter type changes may represent result of small vessel disease although subependymal reabsorption of fluid not entirely excluded. Appearance unchanged.  Mild polypoid opacification inferior aspect maxillary sinuses. Minimal mucosal thickening remainder of paranasal sinuses.  Persistent partial opacification right mastoid air cells.   Electronically Signed   By: Chauncey Cruel M.D.   On: 05/23/2013 19:12   Dg Chest Port 1 View  05/23/2013   CLINICAL DATA:  Shortness of breath, fever, confusion, history hypertension, prostate cancer, diabetes, MI  EXAM: PORTABLE CHEST - 1 VIEW  COMPARISON:  Portable exam 3009 hr compared to 05/19/2013  FINDINGS: Enlargement of cardiac silhouette.  Mildly tortuous thoracic aorta.  Mediastinal contours and pulmonary vascularity normal.  Bibasilar atelectasis greater on LEFT.  No acute infiltrate, pleural effusion or pneumothorax.  IMPRESSION: Enlargement of cardiac silhouette.  Bibasilar atelectasis.   Electronically Signed   By: Lavonia Dana M.D.   On: 05/23/2013 15:25    Scheduled Meds: . allopurinol  300 mg Oral q morning - 10a  . amitriptyline  12.5 mg Oral QHS  . atorvastatin  10 mg Oral q1800  . calcium-vitamin D  1 tablet Oral QPM  .  diltiazem  180 mg Oral q morning - 10a  . docusate sodium  100 mg Oral BID  . gabapentin  300 mg Oral TID  . glipiZIDE  2.5 mg Oral Q breakfast  . insulin aspart  0-15 Units Subcutaneous TID WC  . lactulose  20 g Oral BID  . lidocaine  1 patch Transdermal Q24H  . metoprolol  25 mg Oral BID  . multivitamin with minerals  1 tablet Oral QPM  . mupirocin ointment  1 application Topical TID  . polyethylene glycol  17 g Oral BID  . sodium chloride  3 mL Intravenous Q12H   Continuous Infusions:  Antibiotics Given (last 72 hours)   None      Principal Problem:   Lower GI bleed Active Problems:   Hypertension   Gout   Hyperlipidemia   Deep vein thrombophlebitis of left leg   Prostate cancer   GI bleed   Paroxysmal a-fib  Time spent: [facility] Pager [phone]. If 7PM-7AM, please contact night-coverage at [email or web address], password [facility] 05/24/2013, 8:24 AM  LOS: 5 days

## 2013-05-24 NOTE — Progress Notes (Signed)
NEURO HOSPITALIST PROGRESS NOTE   SUBJECTIVE:                                                                                                                        Patient is alert and oriented to hospital, date , month and brother who is next to him.  He has no complaints. Fever has decreased to 99.1. Per brother back to his baseline.   OBJECTIVE:                                                                                                                           Vital signs in last 24 hours: Temp:  [99.1 F (37.3 C)-102.1 F (38.9 C)] 99.1 F (37.3 C) (04/29 0705) Pulse Rate:  [81-84] 84 (04/29 0454) Resp:  [17] 17 (04/29 0454) BP: (114-125)/(62-64) 125/62 mmHg (04/29 0454) SpO2:  [96 %-97 %] 96 % (04/29 0454)  Intake/Output from previous day: 04/28 0701 - 04/29 0700 In: 240 [P.O.:240] Out: 950 [Urine:950] Intake/Output this shift: Total I/O In: 240 [P.O.:240] Out: -  Nutritional status: Cardiac  Past Medical History  Diagnosis Date  . Hypertension   . Gout     Acute episode involving ankle in 12/2011  . Hyperlipidemia   . Deep vein thrombophlebitis of left leg 11/2010    LLE; S/P THA  . Fasting hyperglycemia   . Stroke 08/2009; 07/2010    TIA in 07/2010  . H/O heat stroke 08/2009  . Prostate cancer 2013    adenocarcinoma; Gleason grade 8; PSA 9.45; EBRT 2013-14  . Renal cyst   . Syncope     01/2012: Negative pharmacologic stress nuclear  . PAF (paroxysmal atrial fibrillation)     a. first noted 01/2012 during admission for URI;  b. seen again 03/2012 after nstemi of unknown origin - pradaxa initiated.  Marland Kitchen CAD (coronary artery disease)     a. 01/2012 Elev Ti-->nonischemic MV;  b. 03/2012 NSTEMI/Cath: LM 50, LAD 50p/m, 95m/d, LCX nl, RI 60ost sup branch, RCA diff nonobs, EF 55%  . Radiation proctitis 02/2013  . NSTEMI (non-ST elevated myocardial infarction) 02/2013  . Diabetes mellitus without complication      Neurologic Exam:   Mental Status: Alert, oriented, thought content appropriate.  Speech fluent without evidence of aphasia.  Able to follow 3 step commands without difficulty. Cranial Nerves: II: Visual fields grossly normal, pupils equal, round, reactive to light and accommodation III,IV, VI: ptosis not present, extra-ocular motions intact bilaterally V,VII: smile symmetric, facial light touch sensation normal bilaterally VIII: hearing normal bilaterally IX,X: gag reflex present XI: bilateral shoulder shrug XII: midline tongue extension without atrophy or fasciculations  Motor: Right : Upper extremity   5/5    Left:     Upper extremity   5/5  Lower extremity   5/5     Lower extremity   5/5 Tone and bulk:normal tone throughout; no atrophy noted Sensory: Pinprick and light touch intact throughout, bilaterally Deep Tendon Reflexes:  Right: Upper Extremity   Left: Upper extremity   biceps (C-5 to C-6) 2/4   biceps (C-5 to C-6) 2/4 tricep (C7) 2/4    triceps (C7) 2/4 Brachioradialis (C6) 2/4  Brachioradialis (C6) 2/4  Lower Extremity Lower Extremity  quadriceps (L-2 to L-4) 1/4   quadriceps (L-2 to L-4) 1/4 Achilles (S1) 0/4   Achilles (S1) 0/4  Plantars: Mute bilaterally    Lab Results: Basic Metabolic Panel:  Recent Labs Lab 05/19/13 1448 05/20/13 0630 05/23/13 0546  NA 134* 142 140  K 4.8 4.4 3.7  CL 99 107 101  CO2 19 23 25   GLUCOSE 259* 91 86  BUN 25* 21 16  CREATININE 0.99 1.10 0.95  CALCIUM 10.3 9.6 9.4    Liver Function Tests:  Recent Labs Lab 05/19/13 1448  AST 25  ALT 35  ALKPHOS 53  BILITOT 0.3  PROT 5.5*  ALBUMIN 2.8*   No results found for this basename: LIPASE, AMYLASE,  in the last 168 hours No results found for this basename: AMMONIA,  in the last 168 hours  CBC:  Recent Labs Lab 05/19/13 1448 05/20/13 0630 05/23/13 0546  WBC 8.5 7.0 8.2  NEUTROABS 7.4  --   --   HGB 13.0 12.1* 12.4*  HCT 38.4* 36.4* 37.5*  MCV 101.9* 104.9* 104.2*  PLT 151 135*  133*    Cardiac Enzymes: No results found for this basename: CKTOTAL, CKMB, CKMBINDEX, TROPONINI,  in the last 168 hours  Lipid Panel: No results found for this basename: CHOL, TRIG, HDL, CHOLHDL, VLDL, LDLCALC,  in the last 168 hours  CBG:  Recent Labs Lab 05/23/13 0740 05/23/13 1251 05/23/13 1643 05/23/13 2148 05/24/13 0717  GLUCAP 86 94 142* 121* 96    Microbiology: Results for orders placed during the hospital encounter of 05/19/13  MRSA PCR SCREENING     Status: None   Collection Time    05/19/13  7:37 PM      Result Value Ref Range Status   MRSA by PCR NEGATIVE  NEGATIVE Final   Comment:            The GeneXpert MRSA Assay (FDA     approved for NASAL specimens     only), is one component of a     comprehensive MRSA colonization     surveillance program. It is not     intended to diagnose MRSA     infection nor to guide or     monitor treatment for     MRSA infections.  CULTURE, BLOOD (ROUTINE X 2)     Status: None   Collection Time    05/23/13  4:25 PM      Result Value Ref Range Status   Specimen Description BLOOD RIGHT HAND  Final   Special Requests BOTTLES DRAWN AEROBIC ONLY 10CC   Final   Culture  Setup Time     Final   Value: 05/23/2013 22:25     Performed at [facility]   Culture     Final   Value:        BLOOD CULTURE RECEIVED NO GROWTH TO DATE CULTURE WILL BE HELD FOR 5 DAYS BEFORE ISSUING A FINAL NEGATIVE REPORT     Performed at [facility]   Report Status PENDING   Incomplete    Coagulation Studies: No results found for this basename: LABPROT, INR,  in the last 72 hours  Imaging: Mr Brain Wo Contrast  05/23/2013   CLINICAL DATA:  Altered mental status. Recent herpes zoster infection. Hypertension. Diabetic. Prostate cancer.  EXAM: MRI HEAD WITHOUT CONTRAST  TECHNIQUE: Multiplanar, multiecho pulse sequences of the brain and surrounding structures were obtained without intravenous contrast.  COMPARISON:  05/07/2013 head CT.   11/21/2012 brain MR.  FINDINGS: Exam is motion degraded.  No acute infarct.  No MR evidence of herpes encephalitis. If this remains of high clinical concern correlation with cerebral spinal fluid analysis may be considered.  Atrophy. Ventricular prominence may reflect superimposed mild hydrocephalus and is without change.  Mild periventricular nonspecific white matter type changes may represent result of small vessel disease although subependymal reabsorption of fluid not entirely excluded. Appearance unchanged.  No intracranial hemorrhage.  Small right vertebral artery once again noted which may predominantly end in a posterior inferior cerebellar artery distribution.  Partially empty sella unchanged and probably an incidental finding.  Mild polypoid opacification inferior aspect maxillary sinuses. Minimal mucosal thickening remainder of paranasal sinuses.  Persistent partial opacification right mastoid air cells.  IMPRESSION: Exam is motion degraded.  No acute infarct.  No MR evidence of herpes encephalitis.  Atrophy. Ventricular prominence may reflect superimposed mild hydrocephalus and is without change.  Mild periventricular nonspecific white matter type changes may represent result of small vessel disease although subependymal reabsorption of fluid not entirely excluded. Appearance unchanged.  Mild polypoid opacification inferior aspect maxillary sinuses. Minimal mucosal thickening remainder of paranasal sinuses.  Persistent partial opacification right mastoid air cells.   Electronically Signed   By: Chauncey Cruel M.D.   On: 05/23/2013 19:12   Dg Chest Port 1 View  05/23/2013   CLINICAL DATA:  Shortness of breath, fever, confusion, history hypertension, prostate cancer, diabetes, MI  EXAM: PORTABLE CHEST - 1 VIEW  COMPARISON:  Portable exam [id number] hr compared to 05/19/2013  FINDINGS: Enlargement of cardiac silhouette.  Mildly tortuous thoracic aorta.  Mediastinal contours and pulmonary vascularity normal.   Bibasilar atelectasis greater on LEFT.  No acute infiltrate, pleural effusion or pneumothorax.  IMPRESSION: Enlargement of cardiac silhouette.  Bibasilar atelectasis.   Electronically Signed   By: Lavonia Dana M.D.   On: 05/23/2013 15:25       MEDICATIONS  Scheduled: . allopurinol  300 mg Oral q morning - 10a  . amitriptyline  12.5 mg Oral QHS  . atorvastatin  10 mg Oral q1800  . calcium-vitamin D  1 tablet Oral QPM  . diltiazem  180 mg Oral q morning - 10a  . docusate sodium  100 mg Oral BID  . gabapentin  300 mg Oral TID  . glipiZIDE  2.5 mg Oral Q breakfast  . insulin aspart  0-15 Units Subcutaneous TID WC  . lactulose  20 g Oral BID  . lidocaine  1 patch Transdermal Q24H  . metoprolol  25 mg Oral BID  . multivitamin with minerals  1 tablet Oral QPM  . mupirocin ointment  1 application Topical TID  . polyethylene glycol  17 g Oral BID  . sodium chloride  3 mL Intravenous Q12H    ASSESSMENT/PLAN:                                                                                                             80 YO male currently being treated for HSV zoster. Patient has shown a progressive mental decline over the past 4 days. On consultation charting noted he was Afebrile. Upon request --a rectal temperature was taken and noted patient was febrile at 102.  Today patient still has LG fever but has improved significantly.  He is alert, oriented, following all commands. MRI brain showed no signs of herpes encephalitis. At this time would hold off on LP and treat underlying source.  Will continue to follow.     Assessment and plan discussed with with attending physician and they are in agreement.    Etta Quill PA-C [facility] [phone]  05/24/2013, 10:45 AM

## 2013-05-25 DIAGNOSIS — I6789 Other cerebrovascular disease: Secondary | ICD-10-CM

## 2013-05-25 DIAGNOSIS — G9340 Encephalopathy, unspecified: Secondary | ICD-10-CM

## 2013-05-25 DIAGNOSIS — K922 Gastrointestinal hemorrhage, unspecified: Secondary | ICD-10-CM

## 2013-05-25 DIAGNOSIS — I2510 Atherosclerotic heart disease of native coronary artery without angina pectoris: Secondary | ICD-10-CM

## 2013-05-25 DIAGNOSIS — I4891 Unspecified atrial fibrillation: Secondary | ICD-10-CM

## 2013-05-25 LAB — GLUCOSE, CAPILLARY
Glucose-Capillary: 100 mg/dL — ABNORMAL HIGH (ref 70–99)
Glucose-Capillary: 104 mg/dL — ABNORMAL HIGH (ref 70–99)
Glucose-Capillary: 109 mg/dL — ABNORMAL HIGH (ref 70–99)
Glucose-Capillary: 112 mg/dL — ABNORMAL HIGH (ref 70–99)

## 2013-05-25 NOTE — Progress Notes (Signed)
PROGRESS NOTE  TAELYN NEMES WEX:[id number] DOB: September 04, 1931 DOA: 05/19/2013 PCP: Leonides Grills, MD   [name] is a 80 y.o. male  With a history of hypertension, hyperlipidemia, left leg DVT, coronary artery disease, history of rectal bleeding, prostate cancer status post radiation, presents emergency department for bright red blood per rectum. Patient states this has been ongoing for approximately 4 days. He had a similar episode in February of 2015 at which point he underwent a sigmoidoscopy which showed bleeding from a dilated capillary, which was due to self disimpaction. Patient was recently admitted on 05/07/2013 weakness and pain control shingles. He is currently taking Valtrex and prednisone. Patient is currently in pain. He only complains of shaking and weakness.   Assessment/Plan:  Painless hematochezia.  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 S/p sigmoidoscopy show no other source of bleeding about 6 weeks ago.  -started Miralax 2 to 3 times a day  -no need for endoscopic evaluation unless the bleeding does not stop.   Fever -U/A clear -Chest x ray unremarkable -blood culture x 2 - prelim neg thus far -temp of 100.8 early this AM - mentation cont to improve  Shingles  -treated but still painful -trying to minimize narcotics as patient it makes patient lethargic -added lidocaine patch -added amytrptyline -neurontin PO  Hypertension  -Continue metoprolol and Lasix   Diabetes mellitus with neuropathy  -Continue glipizide  -Continue gabapentin for neuropathy  -Will place patient on insulin sliding scale with CBG monitoring  -Last hemoglobin A1c November 2014 6.6  Baseline at home appears to be sitting in chair and pivoting to toilet ?AMS here- prob multifactorial -Neurology consulted -Recommended MRI - unremarkable -This AM, pt continues to show improvement in level of alertness -LP was recommended by Neuro if MRI neg - discussed with Neurology. Given t-max of  over 100 this AM, LP is recommended to r/o CNS infection -Per neuro, recommend IR guided LP - will order - Family at bedside reports improving mentation  Code Status: DNR Family Communication: family at bedside Disposition Plan: PT eval- recs SNF but family has 24 hour care givers and want to take home  Consultants:  GI  Neurology  Procedures:  MRI brain 4/28  HPI/Subjective: No major issues overnight. Pt continues to be more awake.  Objective: Filed Vitals:   05/25/13 0504  BP: 112/62  Pulse: 98  Temp: 100.8 F (38.2 C)  Resp: 18    Intake/Output Summary (Last 24 hours) at 05/25/13 1330 Last data filed at 05/25/13 0956  Gross per 24 hour  Intake    720 ml  Output    950 ml  Net   -230 ml   Filed Weights   05/20/13 1108  Weight: 97.07 kg (214 lb)    Exam:  General: easily arousable, in nad Cardiovascular: S1 S2 auscultated, no rubs, murmurs or gallops. Regular rate and rhythm.  Respiratory: Clear to auscultation bilaterally with equal chest rise  Abdomen: Soft, obese, nontender, nondistended, + bowel sounds  Neuro: moves all 4 ext- eyes closed Skin: scarring on right thigh  Data Reviewed: Basic Metabolic Panel:  Recent Labs Lab 05/19/13 1448 05/20/13 0630 05/23/13 0546  NA 134* 142 140  K 4.8 4.4 3.7  CL 99 107 101  CO2 19 23 25   GLUCOSE 259* 91 86  BUN 25* 21 16  CREATININE 0.99 1.10 0.95  CALCIUM 10.3 9.6 9.4   Liver Function Tests:  Recent Labs Lab 05/19/13 1448  AST 25  ALT 35  ALKPHOS  53  BILITOT 0.3  PROT 5.5*  ALBUMIN 2.8*   No results found for this basename: LIPASE, AMYLASE,  in the last 168 hours No results found for this basename: AMMONIA,  in the last 168 hours CBC:  Recent Labs Lab 05/19/13 1448 05/20/13 0630 05/23/13 0546  WBC 8.5 7.0 8.2  NEUTROABS 7.4  --   --   HGB 13.0 12.1* 12.4*  HCT 38.4* 36.4* 37.5*  MCV 101.9* 104.9* 104.2*  PLT 151 135* 133*   Cardiac Enzymes: No results found for this basename:  CKTOTAL, CKMB, CKMBINDEX, TROPONINI,  in the last 168 hours BNP (last 3 results) No results found for this basename: PROBNP,  in the last 8760 hours CBG:  Recent Labs Lab 05/24/13 1151 05/24/13 1642 05/24/13 2236 05/25/13 0734 05/25/13 1153  GLUCAP 98 107* 98 100* 104*    Recent Results (from the past 240 hour(s))  MRSA PCR SCREENING     Status: None   Collection Time    05/19/13  7:37 PM      Result Value Ref Range Status   MRSA by PCR NEGATIVE  NEGATIVE Final   Comment:            The GeneXpert MRSA Assay (FDA     approved for NASAL specimens     only), is one component of a     comprehensive MRSA colonization     surveillance program. It is not     intended to diagnose MRSA     infection nor to guide or     monitor treatment for     MRSA infections.  CULTURE, BLOOD (ROUTINE X 2)     Status: None   Collection Time    05/23/13  4:25 PM      Result Value Ref Range Status   Specimen Description BLOOD RIGHT HAND   Final   Special Requests BOTTLES DRAWN AEROBIC ONLY 10CC   Final   Culture  Setup Time     Final   Value: 05/23/2013 22:25     Performed at [facility]   Culture     Final   Value:        BLOOD CULTURE RECEIVED NO GROWTH TO DATE CULTURE WILL BE HELD FOR 5 DAYS BEFORE ISSUING A FINAL NEGATIVE REPORT     Performed at [facility]   Report Status PENDING   Incomplete     Studies: Mr Brain Wo Contrast  05/23/2013   CLINICAL DATA:  Altered mental status. Recent herpes zoster infection. Hypertension. Diabetic. Prostate cancer.  EXAM: MRI HEAD WITHOUT CONTRAST  TECHNIQUE: Multiplanar, multiecho pulse sequences of the brain and surrounding structures were obtained without intravenous contrast.  COMPARISON:  05/07/2013 head CT.  11/21/2012 brain MR.  FINDINGS: Exam is motion degraded.  No acute infarct.  No MR evidence of herpes encephalitis. If this remains of high clinical concern correlation with cerebral spinal fluid analysis may be considered.   Atrophy. Ventricular prominence may reflect superimposed mild hydrocephalus and is without change.  Mild periventricular nonspecific white matter type changes may represent result of small vessel disease although subependymal reabsorption of fluid not entirely excluded. Appearance unchanged.  No intracranial hemorrhage.  Small right vertebral artery once again noted which may predominantly end in a posterior inferior cerebellar artery distribution.  Partially empty sella unchanged and probably an incidental finding.  Mild polypoid opacification inferior aspect maxillary sinuses. Minimal mucosal thickening remainder of paranasal sinuses.  Persistent partial opacification right mastoid air cells.  IMPRESSION: Exam is motion degraded.  No acute infarct.  No MR evidence of herpes encephalitis.  Atrophy. Ventricular prominence may reflect superimposed mild hydrocephalus and is without change.  Mild periventricular nonspecific white matter type changes may represent result of small vessel disease although subependymal reabsorption of fluid not entirely excluded. Appearance unchanged.  Mild polypoid opacification inferior aspect maxillary sinuses. Minimal mucosal thickening remainder of paranasal sinuses.  Persistent partial opacification right mastoid air cells.   Electronically Signed   By: Chauncey Cruel M.D.   On: 05/23/2013 19:12   Dg Chest Port 1 View  05/23/2013   CLINICAL DATA:  Shortness of breath, fever, confusion, history hypertension, prostate cancer, diabetes, MI  EXAM: PORTABLE CHEST - 1 VIEW  COMPARISON:  Portable exam 0076 hr compared to 05/19/2013  FINDINGS: Enlargement of cardiac silhouette.  Mildly tortuous thoracic aorta.  Mediastinal contours and pulmonary vascularity normal.  Bibasilar atelectasis greater on LEFT.  No acute infiltrate, pleural effusion or pneumothorax.  IMPRESSION: Enlargement of cardiac silhouette.  Bibasilar atelectasis.   Electronically Signed   By: Lavonia Dana M.D.   On:  05/23/2013 15:25    Scheduled Meds: . allopurinol  300 mg Oral q morning - 10a  . amitriptyline  12.5 mg Oral QHS  . atorvastatin  10 mg Oral q1800  . calcium-vitamin D  1 tablet Oral QPM  . diltiazem  180 mg Oral q morning - 10a  . docusate sodium  100 mg Oral BID  . gabapentin  300 mg Oral TID  . glipiZIDE  2.5 mg Oral Q breakfast  . insulin aspart  0-15 Units Subcutaneous TID WC  . lactulose  20 g Oral BID  . lidocaine  1 patch Transdermal Q24H  . metoprolol  25 mg Oral BID  . multivitamin with minerals  1 tablet Oral QPM  . mupirocin ointment  1 application Topical TID  . polyethylene glycol  17 g Oral BID  . sodium chloride  3 mL Intravenous Q12H   Continuous Infusions:  Antibiotics Given (last 72 hours)   None      Principal Problem:   Lower GI bleed Active Problems:   Hypertension   Gout   Hyperlipidemia   Deep vein thrombophlebitis of left leg   Prostate cancer   GI bleed   Paroxysmal a-fib  Time spent: [facility] Pager [phone]. If 7PM-7AM, please contact night-coverage at [email or web address], password [facility] 05/25/2013, 1:30 PM  LOS: 6 days

## 2013-05-25 NOTE — Progress Notes (Signed)
Physical Therapy Treatment Patient Details Name: STEFANOS HAYNESWORTH MRN: [id number] DOB: 30-Jul-1931 Today's Date: 05/25/2013    History of Present Illness history of hypertension, hyperlipidemia, left leg DVT, coronary artery disease, history of rectal bleeding, prostate cancer status post radiation, presents emergency department for bright red blood per rectum. Patient states this has been ongoing for approximately 4 days. He had a similar episode in February of 2015 at which point he underwent a sigmoidoscopy which showed bleeding from a dilated capillary, which was due to self disimpaction. Patient was recently admitted on 05/07/2013 weakness and pain control shingles. He is currently taking Valtrex and prednisone. Patient is currently in pain. He only complains of shaking and weakness.    PT Comments    Due to extensive loose stool, pt's session not only emphasized standing via steady, but consisted of many rolls with graded assist.  Pt's declining ability to participate in standing/transfers led to an abort of sitting in the chair.  Follow Up Recommendations  SNF (though family may insist going home)     Equipment Recommendations  None recommended by PT    Recommendations for Other Services       Precautions / Restrictions Precautions Precautions: Fall    Mobility  Bed Mobility Overal bed mobility: Needs Assistance Bed Mobility: Rolling;Sidelying to Sit;Sit to Supine Rolling: Mod assist;Max assist;+2 for safety/equipment (easily over 15 rolls during pericare and positioning) Sidelying to sit: Max assist;+2 for physical assistance   Sit to supine: Total assist;+2 for physical assistance   General bed mobility comments: Due to extensive and multiple pericare needs, pt needed to be assist to roll many times, each one pt assisted.  Significant truncal assist of two to get to EOB  Transfers Overall transfer level: Needs assistance Equipment used:  (Steady) Transfers: Sit to/from  Stand Sit to Stand: Total assist;+2 physical assistance         General transfer comment: Even with steady, pt became too fatigued to assist.  Sitting in the chair was aborted due to pt becoming less participative and lethargic.  Ambulation/Gait                 Stairs            Wheelchair Mobility    Modified Rankin (Stroke Patients Only)       Balance Overall balance assessment: Needs assistance Sitting-balance support: Feet supported;Bilateral upper extremity supported Sitting balance-Leahy Scale: Poor Sitting balance - Comments: pt requires mod/max A to sit edge of bed with B feet supported Postural control: Other (comment) (Forward lean) Standing balance support: During functional activity;Bilateral upper extremity supported Standing balance-Leahy Scale: Zero Standing balance comment: Total assist of 2 persons to standing today in steady.                    Cognition Arousal/Alertness: Awake/alert;Lethargic (at various points in treatment, ?BP changes) Behavior During Therapy: Agitated;Flat affect (varying throughout treatment) Overall Cognitive Status: History of cognitive impairments - at baseline                      Exercises      General Comments        Pertinent Vitals/Pain     Home Living                      Prior Function            PT Goals (current goals can now be found in the  care plan section) Acute Rehab PT Goals PT Goal Formulation: With patient/family Time For Goal Achievement: 06/05/13 Potential to Achieve Goals: Fair Progress towards PT goals: Not progressing toward goals - comment (very lethargic today)    Frequency  Min 2X/week    PT Plan Current plan remains appropriate    Co-evaluation             End of Session   Activity Tolerance: Patient limited by fatigue;Patient limited by lethargy Patient left: in bed;with call bell/phone within reach;with family/visitor present;with  nursing/sitter in room     Time: 7829-5621 PT Time Calculation (min): 70 min  Charges:  $Therapeutic Activity: 68-82 mins                    G CodesTessie Fass Darcelle Herrada 05/25/2013, 12:10 PM 05/25/2013  Donnella Sham, [place] [phone]  (pager)

## 2013-05-26 ENCOUNTER — Inpatient Hospital Stay (HOSPITAL_COMMUNITY): Payer: Medicare Other

## 2013-05-26 DIAGNOSIS — K922 Gastrointestinal hemorrhage, unspecified: Secondary | ICD-10-CM

## 2013-05-26 DIAGNOSIS — I4891 Unspecified atrial fibrillation: Secondary | ICD-10-CM

## 2013-05-26 DIAGNOSIS — R4182 Altered mental status, unspecified: Secondary | ICD-10-CM

## 2013-05-26 DIAGNOSIS — I2510 Atherosclerotic heart disease of native coronary artery without angina pectoris: Secondary | ICD-10-CM

## 2013-05-26 DIAGNOSIS — I6789 Other cerebrovascular disease: Secondary | ICD-10-CM

## 2013-05-26 DIAGNOSIS — G9340 Encephalopathy, unspecified: Secondary | ICD-10-CM

## 2013-05-26 LAB — BASIC METABOLIC PANEL
BUN: 14 mg/dL (ref 6–23)
CO2: 25 mEq/L (ref 19–32)
Calcium: 9.1 mg/dL (ref 8.4–10.5)
Chloride: 100 mEq/L (ref 96–112)
Creatinine, Ser: 0.9 mg/dL (ref 0.50–1.35)
GFR calc non Af Amer: 79 mL/min — ABNORMAL LOW (ref >90)
Glucose, Bld: 95 mg/dL (ref 70–99)
POTASSIUM: 3.5 meq/L — AB (ref 3.7–5.3)
SODIUM: 138 meq/L (ref 137–147)

## 2013-05-26 LAB — CBC WITH DIFFERENTIAL/PLATELET
BASOS PCT: 0 % (ref 0–1)
Basophils Absolute: 0 10*3/uL (ref 0.0–0.1)
EOS ABS: 0.1 10*3/uL (ref 0.0–0.7)
Eosinophils Relative: 2 % (ref 0–5)
HCT: 33.7 % — ABNORMAL LOW (ref 39.0–52.0)
Hemoglobin: 11.5 g/dL — ABNORMAL LOW (ref 13.0–17.0)
LYMPHS ABS: 1 10*3/uL (ref 0.7–4.0)
Lymphocytes Relative: 15 % (ref 12–46)
MCH: 35.1 pg — ABNORMAL HIGH (ref 26.0–34.0)
MCHC: 34.1 g/dL (ref 30.0–36.0)
MCV: 102.7 fL — ABNORMAL HIGH (ref 78.0–100.0)
Monocytes Absolute: 0.9 10*3/uL (ref 0.1–1.0)
Monocytes Relative: 13 % — ABNORMAL HIGH (ref 3–12)
NEUTROS PCT: 70 % (ref 43–77)
Neutro Abs: 4.8 10*3/uL (ref 1.7–7.7)
PLATELETS: 123 10*3/uL — AB (ref 150–400)
RBC: 3.28 MIL/uL — AB (ref 4.22–5.81)
RDW: 17.3 % — ABNORMAL HIGH (ref 11.5–15.5)
WBC: 6.7 10*3/uL (ref 4.0–10.5)

## 2013-05-26 LAB — CLOSTRIDIUM DIFFICILE BY PCR: CDIFFPCR: NEGATIVE

## 2013-05-26 LAB — GLUCOSE, CAPILLARY
GLUCOSE-CAPILLARY: 92 mg/dL (ref 70–99)
GLUCOSE-CAPILLARY: 88 mg/dL (ref 70–99)
Glucose-Capillary: 104 mg/dL — ABNORMAL HIGH (ref 70–99)
Glucose-Capillary: 66 mg/dL — ABNORMAL LOW (ref 70–99)
Glucose-Capillary: 91 mg/dL (ref 70–99)

## 2013-05-26 MED ORDER — GLUCERNA SHAKE PO LIQD
237.0000 mL | Freq: Two times a day (BID) | ORAL | Status: DC
  Administered 2013-05-27 – 2013-06-06 (×17): 237 mL via ORAL
  Filled 2013-05-26: qty 237

## 2013-05-26 NOTE — Progress Notes (Signed)
PROGRESS NOTE  [name] HYQ:[id number] DOB: 11-06-1931 DOA: 05/19/2013 PCP: Leonides Grills, MD   [name] is a 80 y.o. male  With a history of hypertension, hyperlipidemia, left leg DVT, coronary artery disease, history of rectal bleeding, prostate cancer status post radiation, presents emergency department for bright red blood per rectum. Patient states this has been ongoing for approximately 4 days. He had a similar episode in February of 2015 at which point he underwent a sigmoidoscopy which showed bleeding from a dilated capillary, which was due to self disimpaction. Patient was recently admitted on 05/07/2013 weakness and pain control shingles. He is currently taking Valtrex and prednisone. Patient is currently in pain. He only complains of shaking and weakness.   Assessment/Plan:  Painless hematochezia.  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 S/p sigmoidoscopy show no other source of bleeding about 6 weeks ago.  -started Miralax 2 to 3 times a day  -no need for endoscopic evaluation unless the bleeding does not stop. -Follow CBC   Fever -U/A clear -Chest x ray unremarkable -blood culture x 2 - prelim neg thus far -temp of 100.8 yesterday AM -Discussed with Neurology yesterday - plans for bedside LP today  Shingles  -treated but still painful -trying to minimize narcotics as patient it makes patient lethargic -added lidocaine patch -added amytrptyline -neurontin PO  Hypertension  -Continue metoprolol and Lasix   Diabetes mellitus with neuropathy  -Continue glipizide  -Continue gabapentin for neuropathy  -Will place patient on insulin sliding scale with CBG monitoring  -Last hemoglobin A1c November 2014 6.6  Baseline at home appears to be sitting in chair and pivoting to toilet ?AMS here- prob multifactorial -Neurology consulted -Recommended MRI - unremarkable -LP was recommended by Neuro if MRI neg - discussed with Neurology. Given t-max of over 100, LP is  recommended to r/o CNS infection  Code Status: DNR Family Communication: family at bedside Disposition Plan: PT eval- recs SNF but family has 24 hour care givers and want to take home  Consultants:  GI  Neurology  Procedures:  MRI brain 4/28  HPI/Subjective: No complaints. Mildly confused this AM  Objective: Filed Vitals:   05/26/13 0552  BP: 96/63  Pulse: 86  Temp: 99 F (37.2 C)  Resp: 19    Intake/Output Summary (Last 24 hours) at 05/26/13 0839 Last data filed at 05/26/13 0709  Gross per 24 hour  Intake    720 ml  Output   1025 ml  Net   -305 ml   Filed Weights   05/20/13 1108  Weight: 97.07 kg (214 lb)    Exam:  General: easily arousable, in nad Cardiovascular: S1 S2 auscultated, no rubs, murmurs or gallops. Regular rate and rhythm.  Respiratory: Clear to auscultation bilaterally with equal chest rise  Abdomen: Soft, obese, nontender, nondistended, + bowel sounds  Neuro: moves all 4 ext- eyes closed Skin: scarring on right thigh  Data Reviewed: Basic Metabolic Panel:  Recent Labs Lab 05/19/13 1448 05/20/13 0630 05/23/13 0546  NA 134* 142 140  K 4.8 4.4 3.7  CL 99 107 101  CO2 19 23 25   GLUCOSE 259* 91 86  BUN 25* 21 16  CREATININE 0.99 1.10 0.95  CALCIUM 10.3 9.6 9.4   Liver Function Tests:  Recent Labs Lab 05/19/13 1448  AST 25  ALT 35  ALKPHOS 53  BILITOT 0.3  PROT 5.5*  ALBUMIN 2.8*   No results found for this basename: LIPASE, AMYLASE,  in the last 168 hours No results  found for this basename: AMMONIA,  in the last 168 hours CBC:  Recent Labs Lab 05/19/13 1448 05/20/13 0630 05/23/13 0546  WBC 8.5 7.0 8.2  NEUTROABS 7.4  --   --   HGB 13.0 12.1* 12.4*  HCT 38.4* 36.4* 37.5*  MCV 101.9* 104.9* 104.2*  PLT 151 135* 133*   Cardiac Enzymes: No results found for this basename: CKTOTAL, CKMB, CKMBINDEX, TROPONINI,  in the last 168 hours BNP (last 3 results) No results found for this basename: PROBNP,  in the last 8760  hours CBG:  Recent Labs Lab 05/25/13 0734 05/25/13 1153 05/25/13 1634 05/25/13 2302 05/26/13 0801  GLUCAP 100* 104* 109* 112* 104*    Recent Results (from the past 240 hour(s))  MRSA PCR SCREENING     Status: None   Collection Time    05/19/13  7:37 PM      Result Value Ref Range Status   MRSA by PCR NEGATIVE  NEGATIVE Final   Comment:            The GeneXpert MRSA Assay (FDA     approved for NASAL specimens     only), is one component of a     comprehensive MRSA colonization     surveillance program. It is not     intended to diagnose MRSA     infection nor to guide or     monitor treatment for     MRSA infections.  CULTURE, BLOOD (ROUTINE X 2)     Status: None   Collection Time    05/23/13  4:25 PM      Result Value Ref Range Status   Specimen Description BLOOD RIGHT HAND   Final   Special Requests BOTTLES DRAWN AEROBIC ONLY 10CC   Final   Culture  Setup Time     Final   Value: 05/23/2013 22:25     Performed at [facility]   Culture     Final   Value:        BLOOD CULTURE RECEIVED NO GROWTH TO DATE CULTURE WILL BE HELD FOR 5 DAYS BEFORE ISSUING A FINAL NEGATIVE REPORT     Performed at [facility]   Report Status PENDING   Incomplete  CLOSTRIDIUM DIFFICILE BY PCR     Status: None   Collection Time    05/26/13  4:39 AM      Result Value Ref Range Status   C difficile by pcr NEGATIVE  NEGATIVE Final     Studies: No results found.  Scheduled Meds: . allopurinol  300 mg Oral q morning - 10a  . amitriptyline  12.5 mg Oral QHS  . atorvastatin  10 mg Oral q1800  . calcium-vitamin D  1 tablet Oral QPM  . diltiazem  180 mg Oral q morning - 10a  . docusate sodium  100 mg Oral BID  . gabapentin  300 mg Oral TID  . glipiZIDE  2.5 mg Oral Q breakfast  . insulin aspart  0-15 Units Subcutaneous TID WC  . lactulose  20 g Oral BID  . lidocaine  1 patch Transdermal Q24H  . metoprolol  25 mg Oral BID  . multivitamin with minerals  1 tablet Oral QPM  .  mupirocin ointment  1 application Topical TID  . polyethylene glycol  17 g Oral BID  . sodium chloride  3 mL Intravenous Q12H   Continuous Infusions:  Antibiotics Given (last 72 hours)   None      Principal  Problem:   Lower GI bleed Active Problems:   Hypertension   Gout   Hyperlipidemia   Deep vein thrombophlebitis of left leg   Prostate cancer   GI bleed   Paroxysmal a-fib  Time spent: [facility] Pager [phone]. If 7PM-7AM, please contact night-coverage at [email or web address], password [facility] 05/26/2013, 8:39 AM  LOS: 7 days

## 2013-05-26 NOTE — Progress Notes (Signed)
INITIAL NUTRITION ASSESSMENT  DOCUMENTATION CODES Per approved criteria  -Obesity Unspecified   INTERVENTION: Provide Glucerna Shakes TID Continue Multivitamin with minerals daily Recommend additional Vitamin C supplement to promote wound healing- 200 mg daily  NUTRITION DIAGNOSIS: Increased nutrient needs related to wound healing as evidenced by estimated energy needs and stage 2 and stage 1 pressure ulcers. .   Goal: Pt to meet >/= 90% of their estimated nutrition needs   Monitor:  PO intake, weight, labs, I/O's, plan of care  Reason for Assessment: Low braden  80 y.o. male  Admitting Dx: Lower GI bleed  ASSESSMENT: 80 y.o. male with a history of hypertension, hyperlipidemia, left leg DVT, coronary artery disease, history of rectal bleeding, prostate cancer status post radiation, presents emergency department for bright red blood per rectum. Patient states this has been ongoing for approximately 4 days. Patient was recently admitted on 05/07/2013 weakness and pain control shingles. He is currently taking Valtrex and prednisone. Patient is currently in pain. He only complains of shaking and weakness.  Pt asleep at time of visit. Per pt's brother at bedside pt has been eating well but, some meals he eats less than 50%. Per brother pt was eating well PTA. Per nursing notes, pt is eating 80-100% of some meals but, only 0, 35, or 50% of other meals. Pt with two pressure ulcers. Per weight history below pt has lost 5% of his body weight within the past 2 months.   Limited physical exam due to pt sleeping.  Nutrition Focused Physical Exam:  Subcutaneous Fat:  Orbital Region: wnl Upper Arm Region: wnl Thoracic and Lumbar Region: NA  Muscle:  Temple Region: wnl Clavicle Bone Region: NA Clavicle and Acromion Bone Region: NA Scapular Bone Region: NA Dorsal Hand: mild wasting Patellar Region: wnl Anterior Thigh Region: moderate wasting Posterior Calf Region: NA  Edema: none  noted   Height: Ht Readings from Last 1 Encounters:  05/20/13 5\' 8"  (1.727 m)    Weight: Wt Readings from Last 1 Encounters:  05/20/13 214 lb (97.07 kg)    Ideal Body Weight: 154 lbs  % Ideal Body Weight: 139%  Wt Readings from Last 10 Encounters:  05/20/13 214 lb (97.07 kg)  05/10/13 217 lb (98.431 kg)  05/08/13 214 lb 12.8 oz (97.433 kg)  04/01/13 225 lb (102.059 kg)  03/12/13 224 lb 6.9 oz (101.8 kg)  03/12/13 224 lb 6.9 oz (101.8 kg)  12/16/12 226 lb (102.513 kg)  12/02/12 226 lb 4.8 oz (102.649 kg)  11/20/12 225 lb (102.059 kg)  05/19/12 211 lb (95.709 kg)    Usual Body Weight: 225 lbs   % Usual Body Weight: 95%  BMI:  Body mass index is 32.55 kg/(m^2).  Estimated Nutritional Needs: Kcal: 1800-2000 Protein: 100-115 grams Fluid: 1.8-2 L/day  Skin: non-pitting RLE and LLE edema; stage 2 pressure ulcer to right buttocks, stage 1 pressure ulcer to left buttocks  Diet Order: Cardiac  EDUCATION NEEDS: -No education needs identified at this time   Intake/Output Summary (Last 24 hours) at 05/26/13 0851 Last data filed at 05/26/13 0709  Gross per 24 hour  Intake    720 ml  Output   1025 ml  Net   -305 ml    Last BM: 4/24   Labs:   Recent Labs Lab 05/19/13 1448 05/20/13 0630 05/23/13 0546  NA 134* 142 140  K 4.8 4.4 3.7  CL 99 107 101  CO2 19 23 25   BUN 25* 21 16  CREATININE 0.99  1.10 0.95  CALCIUM 10.3 9.6 9.4  GLUCOSE 259* 91 86    CBG (last 3)   Recent Labs  05/25/13 1634 05/25/13 2302 05/26/13 0801  GLUCAP 109* 112* 104*    Scheduled Meds: . allopurinol  300 mg Oral q morning - 10a  . amitriptyline  12.5 mg Oral QHS  . atorvastatin  10 mg Oral q1800  . calcium-vitamin D  1 tablet Oral QPM  . diltiazem  180 mg Oral q morning - 10a  . docusate sodium  100 mg Oral BID  . gabapentin  300 mg Oral TID  . glipiZIDE  2.5 mg Oral Q breakfast  . insulin aspart  0-15 Units Subcutaneous TID WC  . lactulose  20 g Oral BID  .  lidocaine  1 patch Transdermal Q24H  . metoprolol  25 mg Oral BID  . multivitamin with minerals  1 tablet Oral QPM  . mupirocin ointment  1 application Topical TID  . polyethylene glycol  17 g Oral BID  . sodium chloride  3 mL Intravenous Q12H    Continuous Infusions:   Past Medical History  Diagnosis Date  . Hypertension   . Gout     Acute episode involving ankle in 12/2011  . Hyperlipidemia   . Deep vein thrombophlebitis of left leg 11/2010    LLE; S/P THA  . Fasting hyperglycemia   . Stroke 08/2009; 07/2010    TIA in 07/2010  . H/O heat stroke 08/2009  . Prostate cancer 2013    adenocarcinoma; Gleason grade 8; PSA 9.45; EBRT 2013-14  . Renal cyst   . Syncope     01/2012: Negative pharmacologic stress nuclear  . PAF (paroxysmal atrial fibrillation)     a. first noted 01/2012 during admission for URI;  b. seen again 03/2012 after nstemi of unknown origin - pradaxa initiated.  Marland Kitchen CAD (coronary artery disease)     a. 01/2012 Elev Ti-->nonischemic MV;  b. 03/2012 NSTEMI/Cath: LM 50, LAD 50p/m, 77m/d, LCX nl, RI 60ost sup branch, RCA diff nonobs, EF 55%  . Radiation proctitis 02/2013  . NSTEMI (non-ST elevated myocardial infarction) 02/2013  . Diabetes mellitus without complication     Past Surgical History  Procedure Laterality Date  . Hip arthroplasty  12/08/2010    ARTHROPLASTY BIPOLAR HIP;  Surgeon-Olin; Left  . 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 I&d extremity  05/28/2011    Procedure: IRRIGATION AND DEBRIDEMENT EXTREMITY;  Surgeon: Linna Hoff, MD;  Location: [facility];  Service: Orthopedics;  Laterality: Right;  I & D Right hand/wrist  . Colonoscopy   07/18/2003    RMR: Rectum internal hemorrhoids.  Otherwise, normal rectum/ Left-sided diverticula.  Remainder of colonic mucosa-nl  . Flexible sigmoidoscopy N/A 03/18/2013    Procedure: FLEXIBLE SIGMOIDOSCOPY;  Surgeon: Wonda Horner, MD;  Location: [facility] ENDOSCOPY;  Service: Endoscopy;  Laterality:  N/A;  . Cardiac catheterization  03/2012    non-obstructive ASCAD, medial management only, pt does not want repeat cath    Pryor Ochoa RD, LDN Inpatient Clinical Dietitian Pager: [phone] After Hours Pager: [phone]

## 2013-05-26 NOTE — Progress Notes (Signed)
Lumbar puncture was attempted at the L3-4 interspace following sterile preparation and instillation of 1% lidocaine for local anesthesia. Lumbar puncture was unsuccessful. Patient tolerated procedure well with no complications.  Rush Farmer M.D. [facility] [phone]

## 2013-05-27 ENCOUNTER — Inpatient Hospital Stay (HOSPITAL_COMMUNITY): Payer: Medicare Other

## 2013-05-27 DIAGNOSIS — I6789 Other cerebrovascular disease: Secondary | ICD-10-CM

## 2013-05-27 LAB — GLUCOSE, CAPILLARY
Glucose-Capillary: 84 mg/dL (ref 70–99)
Glucose-Capillary: 130 mg/dL — ABNORMAL HIGH (ref 70–99)
Glucose-Capillary: 149 mg/dL — ABNORMAL HIGH (ref 70–99)
Glucose-Capillary: 118 mg/dL — ABNORMAL HIGH (ref 70–99)

## 2013-05-27 LAB — FECAL LACTOFERRIN, QUANT: Fecal Lactoferrin: NEGATIVE

## 2013-05-27 MED ORDER — GABAPENTIN 100 MG PO CAPS
100.0000 mg | ORAL_CAPSULE | Freq: Three times a day (TID) | ORAL | Status: DC
  Administered 2013-05-27: 17:00:00 via ORAL
  Administered 2013-05-27 – 2013-05-28 (×3): 100 mg via ORAL
  Filled 2013-05-27 (×4): qty 1

## 2013-05-27 MED ORDER — FUROSEMIDE 10 MG/ML IJ SOLN
40.0000 mg | Freq: Every day | INTRAMUSCULAR | Status: DC
  Administered 2013-05-27 – 2013-06-01 (×6): 40 mg via INTRAVENOUS
  Filled 2013-05-27 (×7): qty 4

## 2013-05-27 NOTE — Progress Notes (Signed)
PCXR done,Lasix given as ord. 02/2l via [place].condom cath on,family @bs .

## 2013-05-27 NOTE — Progress Notes (Signed)
PROGRESS NOTE  MARLEY CHARLOT UXL:[id number] DOB: 11/17/1931 DOA: 05/19/2013 PCP: Leonides Grills, MD   Javontay Vandam [name] is a 80 y.o. male  With a history of hypertension, hyperlipidemia, left leg DVT, coronary artery disease, history of rectal bleeding, prostate cancer status post radiation, presents emergency department for bright red blood per rectum. Patient states this has been ongoing for approximately 4 days. He had a similar episode in February of 2015 at which point he underwent a sigmoidoscopy which showed bleeding from a dilated capillary, which was due to self disimpaction. Patient was recently admitted on 05/07/2013 weakness and pain control shingles. He is currently taking Valtrex and prednisone. Patient is currently in pain. He only complains of shaking and weakness.   Assessment/Plan:  Painless hematochezia.  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 S/p sigmoidoscopy show no other source of bleeding about 6 weeks ago.  -started Miralax 2 to 3 times a day  -no need for endoscopic evaluation unless the bleeding does not stop. -Follow CBC   Fever -U/A clear -Chest x ray unremarkable -blood culture x 2 - prelim neg thus far -temp of 100.8 yesterday AM -Attempted LP at bedside by Neurology - unsucessful - will d/w neurology if LP still warranted. Afebrile overnight  Shingles  -treated but still painful -trying to minimize narcotics as patient it makes patient lethargic -added lidocaine patch -added amytrptyline -neurontin PO - reportedly was not taking at home secondary to medication induced lethargy  Hypertension  -Continue metoprolol and Lasix   Diabetes mellitus with neuropathy  -Continue glipizide  -Continue gabapentin for neuropathy  -Will place patient on insulin sliding scale with CBG monitoring  -Last hemoglobin A1c November 2014 6.6  Baseline at home appears to be sitting in chair and pivoting to toilet ?AMS here- prob multifactorial -Neurology  consulted -Recommended MRI - unremarkable -LP was recommended by Neuro if MRI neg - discussed with Neurology. Given t-max of over 100 recently, LP is recommended to r/o CNS infection  Code Status: DNR Family Communication: family at bedside Disposition Plan: PT eval- recs SNF but family has 24 hour care givers and want to take home  Consultants:  GI  Neurology  Procedures:  MRI brain 4/28  HPI/Subjective: No acute events noted  Objective: Filed Vitals:   05/27/13 0525  BP: 105/65  Pulse: 80  Temp: 98.9 F (37.2 C)  Resp: 18    Intake/Output Summary (Last 24 hours) at 05/27/13 0903 Last data filed at 05/27/13 0122  Gross per 24 hour  Intake      3 ml  Output    650 ml  Net   -647 ml   Filed Weights   05/20/13 1108  Weight: 97.07 kg (214 lb)    Exam:  General: easily arousable, in nad Cardiovascular: S1 S2 auscultated, no rubs, murmurs or gallops. Regular rate and rhythm.  Respiratory: Clear to auscultation bilaterally with equal chest rise  Abdomen: Soft, obese, nontender, nondistended, + bowel sounds  Neuro: moves all 4 ext- eyes closed Skin: scarring on right thigh  Data Reviewed: Basic Metabolic Panel:  Recent Labs Lab 05/23/13 0546 05/26/13 1025  NA 140 138  K 3.7 3.5*  CL 101 100  CO2 25 25  GLUCOSE 86 95  BUN 16 14  CREATININE 0.95 0.90  CALCIUM 9.4 9.1   Liver Function Tests: No results found for this basename: AST, ALT, ALKPHOS, BILITOT, PROT, ALBUMIN,  in the last 168 hours No results found for this basename: LIPASE, AMYLASE,  in the last  168 hours No results found for this basename: AMMONIA,  in the last 168 hours CBC:  Recent Labs Lab 05/23/13 0546 05/26/13 1025  WBC 8.2 6.7  NEUTROABS  --  4.8  HGB 12.4* 11.5*  HCT 37.5* 33.7*  MCV 104.2* 102.7*  PLT 133* 123*   Cardiac Enzymes: No results found for this basename: CKTOTAL, CKMB, CKMBINDEX, TROPONINI,  in the last 168 hours BNP (last 3 results) No results found for this  basename: PROBNP,  in the last 8760 hours CBG:  Recent Labs Lab 05/26/13 1154 05/26/13 1715 05/26/13 2213 05/26/13 2301 05/27/13 0807  GLUCAP 92 88 66* 91 84    Recent Results (from the past 240 hour(s))  MRSA PCR SCREENING     Status: None   Collection Time    05/19/13  7:37 PM      Result Value Ref Range Status   MRSA by PCR NEGATIVE  NEGATIVE Final   Comment:            The GeneXpert MRSA Assay (FDA     approved for NASAL specimens     only), is one component of a     comprehensive MRSA colonization     surveillance program. It is not     intended to diagnose MRSA     infection nor to guide or     monitor treatment for     MRSA infections.  CULTURE, BLOOD (ROUTINE X 2)     Status: None   Collection Time    05/23/13  4:25 PM      Result Value Ref Range Status   Specimen Description BLOOD RIGHT HAND   Final   Special Requests BOTTLES DRAWN AEROBIC ONLY 10CC   Final   Culture  Setup Time     Final   Value: 05/23/2013 22:25     Performed at [facility]   Culture     Final   Value:        BLOOD CULTURE RECEIVED NO GROWTH TO DATE CULTURE WILL BE HELD FOR 5 DAYS BEFORE ISSUING A FINAL NEGATIVE REPORT     Performed at [facility]   Report Status PENDING   Incomplete  CLOSTRIDIUM DIFFICILE BY PCR     Status: None   Collection Time    05/26/13  4:39 AM      Result Value Ref Range Status   C difficile by pcr NEGATIVE  NEGATIVE Final     Studies: No results found.  Scheduled Meds: . allopurinol  300 mg Oral q morning - 10a  . amitriptyline  12.5 mg Oral QHS  . atorvastatin  10 mg Oral q1800  . calcium-vitamin D  1 tablet Oral QPM  . diltiazem  180 mg Oral q morning - 10a  . docusate sodium  100 mg Oral BID  . feeding supplement (GLUCERNA SHAKE)  237 mL Oral BID PC  . gabapentin  300 mg Oral TID  . glipiZIDE  2.5 mg Oral Q breakfast  . insulin aspart  0-15 Units Subcutaneous TID WC  . lactulose  20 g Oral BID  . lidocaine  1 patch Transdermal  Q24H  . metoprolol  25 mg Oral BID  . multivitamin with minerals  1 tablet Oral QPM  . polyethylene glycol  17 g Oral BID  . sodium chloride  3 mL Intravenous Q12H   Continuous Infusions:  Antibiotics Given (last 72 hours)   None      Principal Problem:  Lower GI bleed Active Problems:   Hypertension   Gout   Hyperlipidemia   Deep vein thrombophlebitis of left leg   Prostate cancer   GI bleed   Paroxysmal a-fib  Time spent: [facility] Pager [phone]. If 7PM-7AM, please contact night-coverage at [email or web address], password [facility] 05/27/2013, 9:03 AM  LOS: 8 days

## 2013-05-28 LAB — GLUCOSE, CAPILLARY
GLUCOSE-CAPILLARY: 272 mg/dL — AB (ref 70–99)
Glucose-Capillary: 112 mg/dL — ABNORMAL HIGH (ref 70–99)
Glucose-Capillary: 303 mg/dL — ABNORMAL HIGH (ref 70–99)
Glucose-Capillary: 334 mg/dL — ABNORMAL HIGH (ref 70–99)

## 2013-05-28 LAB — TSH: TSH: 1.06 u[IU]/mL (ref 0.350–4.500)

## 2013-05-28 NOTE — Progress Notes (Signed)
PROGRESS NOTE  [name] TMH:[id number] DOB: 1931/07/05 DOA: 05/19/2013 PCP: Leonides Grills, MD   Brendin Situ [name] is a 80 y.o. male  With a history of hypertension, hyperlipidemia, left leg DVT, coronary artery disease, history of rectal bleeding, prostate cancer status post radiation, presents emergency department for bright red blood per rectum. Patient states this has been ongoing for approximately 4 days. He had a similar episode in February of 2015 at which point he underwent a sigmoidoscopy which showed bleeding from a dilated capillary, which was due to self disimpaction. Patient was recently admitted on 05/07/2013 weakness and pain control shingles. He is currently taking Valtrex and prednisone. Patient is currently in pain. He only complains of shaking and weakness.   Assessment/Plan:  Painless hematochezia.  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 S/p sigmoidoscopy show no other source of bleeding about 6 weeks ago.  -started Miralax 2 to 3 times a day  -no need for endoscopic evaluation unless the bleeding does not stop. -Follow CBC   Fever -U/A clear -Chest x ray unremarkable -blood culture x 2 - prelim neg thus far -low grade temps in AM - 99 this AM -Attempted LP at bedside by Neurology - unsucessful  Shingles  -treated but still painful -trying to minimize narcotics as patient it makes patient lethargic -added lidocaine patch -added amytrptyline -neurontin PO - reportedly was not taking at home secondary to medication induced lethargy - decreased dose from 300mg  tid to 100mg  tid per below  Hypertension  -Continue metoprolol and Lasix   Diabetes mellitus with neuropathy  -Continue glipizide  -Continue gabapentin for neuropathy - decreased dose per below -Will place patient on insulin sliding scale with CBG monitoring  -Last hemoglobin A1c November 2014 6.6  Baseline at home appears to be sitting in chair and pivoting to toilet ?AMS here- prob multifactorial  vs polypharmacy? -Neurology consulted -Recommended MRI - unremarkable -Was unable to obtain bedside LP -Decreased neurontin from 300->100mg  tid -Consider d/c'ing neurontin entirely -Per neurology, check b12, folate, EEG, IR guided LP  Code Status: DNR Family Communication: family at bedside Disposition Plan: PT eval- recs SNF but family has 24 hour care givers and want to take home  Consultants:  GI  Neurology  Procedures:  MRI brain 4/28  HPI/Subjective: Per family, slightly more awake, but intermittently confused and lethargic  Objective: Filed Vitals:   05/28/13 0552  BP: 115/63  Pulse: 92  Temp: 99.1 F (37.3 C)  Resp: 16    Intake/Output Summary (Last 24 hours) at 05/28/13 1131 Last data filed at 05/28/13 1100  Gross per 24 hour  Intake    440 ml  Output   1050 ml  Net   -610 ml   Filed Weights   05/20/13 1108 05/27/13 1813 05/28/13 0552  Weight: 97.07 kg (214 lb) 105.235 kg (232 lb) 102.241 kg (225 lb 6.4 oz)    Exam:  General: easily arousable, in nad Cardiovascular: S1 S2 auscultated, no rubs, murmurs or gallops. Regular rate and rhythm.  Respiratory: Clear to auscultation bilaterally with equal chest rise  Abdomen: Soft, obese, nontender, nondistended, + bowel sounds  Neuro: moves all 4 ext- eyes closed Skin: scarring on right thigh  Data Reviewed: Basic Metabolic Panel:  Recent Labs Lab 05/23/13 0546 05/26/13 1025  NA 140 138  K 3.7 3.5*  CL 101 100  CO2 25 25  GLUCOSE 86 95  BUN 16 14  CREATININE 0.95 0.90  CALCIUM 9.4 9.1   Liver Function Tests: No results found  for this basename: AST, ALT, ALKPHOS, BILITOT, PROT, ALBUMIN,  in the last 168 hours No results found for this basename: LIPASE, AMYLASE,  in the last 168 hours No results found for this basename: AMMONIA,  in the last 168 hours CBC:  Recent Labs Lab 05/23/13 0546 05/26/13 1025  WBC 8.2 6.7  NEUTROABS  --  4.8  HGB 12.4* 11.5*  HCT 37.5* 33.7*  MCV 104.2* 102.7*   PLT 133* 123*   Cardiac Enzymes: No results found for this basename: CKTOTAL, CKMB, CKMBINDEX, TROPONINI,  in the last 168 hours BNP (last 3 results) No results found for this basename: PROBNP,  in the last 8760 hours CBG:  Recent Labs Lab 05/27/13 0807 05/27/13 1320 05/27/13 1722 05/27/13 2155 05/28/13 0753  GLUCAP 84 130* 149* 118* 112*    Recent Results (from the past 240 hour(s))  MRSA PCR SCREENING     Status: None   Collection Time    05/19/13  7:37 PM      Result Value Ref Range Status   MRSA by PCR NEGATIVE  NEGATIVE Final   Comment:            The GeneXpert MRSA Assay (FDA     approved for NASAL specimens     only), is one component of a     comprehensive MRSA colonization     surveillance program. It is not     intended to diagnose MRSA     infection nor to guide or     monitor treatment for     MRSA infections.  CULTURE, BLOOD (ROUTINE X 2)     Status: None   Collection Time    05/23/13  4:25 PM      Result Value Ref Range Status   Specimen Description BLOOD RIGHT HAND   Final   Special Requests BOTTLES DRAWN AEROBIC ONLY 10CC   Final   Culture  Setup Time     Final   Value: 05/23/2013 22:25     Performed at [facility]   Culture     Final   Value:        BLOOD CULTURE RECEIVED NO GROWTH TO DATE CULTURE WILL BE HELD FOR 5 DAYS BEFORE ISSUING A FINAL NEGATIVE REPORT     Performed at [facility]   Report Status PENDING   Incomplete  CLOSTRIDIUM DIFFICILE BY PCR     Status: None   Collection Time    05/26/13  4:39 AM      Result Value Ref Range Status   C difficile by pcr NEGATIVE  NEGATIVE Final     Studies: Dg Chest Port 1 View  05/27/2013   CLINICAL DATA:  Short of breath.  EXAM: PORTABLE CHEST - 1 VIEW  COMPARISON:  05/23/2013  FINDINGS: The cardiac silhouette remains mildly enlarged. No gross mediastinal or hilar masses. Lung volumes are relatively low. There is bronchovascular crowding. There is no convincing infiltrate or  edema. No pleural effusion or pneumothorax.  IMPRESSION: No acute cardiopulmonary disease. Stable appearance from the prior exam.   Electronically Signed   By: Lajean Manes M.D.   On: 05/27/2013 20:11    Scheduled Meds: . allopurinol  300 mg Oral q morning - 10a  . amitriptyline  12.5 mg Oral QHS  . atorvastatin  10 mg Oral q1800  . calcium-vitamin D  1 tablet Oral QPM  . diltiazem  180 mg Oral q morning - 10a  . docusate sodium  100 mg  Oral BID  . feeding supplement (GLUCERNA SHAKE)  237 mL Oral BID PC  . furosemide  40 mg Intravenous Daily  . gabapentin  100 mg Oral TID  . glipiZIDE  2.5 mg Oral Q breakfast  . insulin aspart  0-15 Units Subcutaneous TID WC  . lactulose  20 g Oral BID  . lidocaine  1 patch Transdermal Q24H  . metoprolol  25 mg Oral BID  . multivitamin with minerals  1 tablet Oral QPM  . polyethylene glycol  17 g Oral BID  . sodium chloride  3 mL Intravenous Q12H   Continuous Infusions:  Antibiotics Given (last 72 hours)   None      Principal Problem:   Lower GI bleed Active Problems:   Hypertension   Gout   Hyperlipidemia   Deep vein thrombophlebitis of left leg   Prostate cancer   GI bleed   Paroxysmal a-fib  Time spent: [facility] Pager [phone]. If 7PM-7AM, please contact night-coverage at [email or web address], password [facility] 05/28/2013, 11:31 AM  LOS: 9 days

## 2013-05-28 NOTE — Progress Notes (Signed)
Very sleepy,difficult to get pt. to swallow po meds,beginning to pocket meds. SBP 100,metoprolol held.

## 2013-05-29 ENCOUNTER — Inpatient Hospital Stay (HOSPITAL_COMMUNITY): Payer: Medicare Other

## 2013-05-29 LAB — COMPREHENSIVE METABOLIC PANEL
ALBUMIN: 2.2 g/dL — AB (ref 3.5–5.2)
ALT: 14 U/L (ref 0–53)
AST: 12 U/L (ref 0–37)
Alkaline Phosphatase: 57 U/L (ref 39–117)
BUN: 21 mg/dL (ref 6–23)
CO2: 27 mEq/L (ref 19–32)
Calcium: 9.3 mg/dL (ref 8.4–10.5)
Chloride: 97 mEq/L (ref 96–112)
Creatinine, Ser: 0.99 mg/dL (ref 0.50–1.35)
GFR calc Af Amer: 88 mL/min — ABNORMAL LOW (ref >90)
GFR calc non Af Amer: 76 mL/min — ABNORMAL LOW (ref >90)
Glucose, Bld: 156 mg/dL — ABNORMAL HIGH (ref 70–99)
POTASSIUM: 3.8 meq/L (ref 3.7–5.3)
Sodium: 136 mEq/L — ABNORMAL LOW (ref 137–147)
TOTAL PROTEIN: 5.4 g/dL — AB (ref 6.0–8.3)
Total Bilirubin: 0.3 mg/dL (ref 0.3–1.2)

## 2013-05-29 LAB — CBC
HCT: 31.3 % — ABNORMAL LOW (ref 39.0–52.0)
HEMOGLOBIN: 10.5 g/dL — AB (ref 13.0–17.0)
MCH: 33.5 pg (ref 26.0–34.0)
MCHC: 33.5 g/dL (ref 30.0–36.0)
MCV: 100 fL (ref 78.0–100.0)
Platelets: 161 10*3/uL (ref 150–400)
RBC: 3.13 MIL/uL — ABNORMAL LOW (ref 4.22–5.81)
RDW: 16.4 % — AB (ref 11.5–15.5)
WBC: 10.5 10*3/uL (ref 4.0–10.5)

## 2013-05-29 LAB — RPR

## 2013-05-29 LAB — CSF CELL COUNT WITH DIFFERENTIAL
Eosinophils, CSF: 0 % (ref 0–1)
LYMPHS CSF: 87 % — AB (ref 40–80)
MONOCYTE-MACROPHAGE-SPINAL FLUID: 13 % — AB (ref 15–45)
RBC Count, CSF: 12 /mm3 — ABNORMAL HIGH
Segmented Neutrophils-CSF: 0 % (ref 0–6)
Tube #: 1
WBC, CSF: 18 /mm3 (ref 0–5)

## 2013-05-29 LAB — GRAM STAIN

## 2013-05-29 LAB — GLUCOSE, CAPILLARY
GLUCOSE-CAPILLARY: 177 mg/dL — AB (ref 70–99)
GLUCOSE-CAPILLARY: 268 mg/dL — AB (ref 70–99)
Glucose-Capillary: 139 mg/dL — ABNORMAL HIGH (ref 70–99)
Glucose-Capillary: 204 mg/dL — ABNORMAL HIGH (ref 70–99)
Glucose-Capillary: 146 mg/dL — ABNORMAL HIGH (ref 70–99)

## 2013-05-29 LAB — HIV ANTIBODY (ROUTINE TESTING W REFLEX): HIV: NONREACTIVE

## 2013-05-29 LAB — CULTURE, BLOOD (ROUTINE X 2): CULTURE: NO GROWTH

## 2013-05-29 LAB — PROTEIN AND GLUCOSE, CSF
Glucose, CSF: 96 mg/dL — ABNORMAL HIGH (ref 43–76)
Total  Protein, CSF: 51 mg/dL — ABNORMAL HIGH (ref 15–45)

## 2013-05-29 LAB — VITAMIN B12: VITAMIN B 12: 417 pg/mL (ref 211–911)

## 2013-05-29 LAB — FOLATE RBC: RBC FOLATE: 1265 ng/mL — AB (ref >280)

## 2013-05-29 MED ORDER — DEXTROSE 5 % IV SOLN
10.0000 mg/kg | Freq: Three times a day (TID) | INTRAVENOUS | Status: DC
  Administered 2013-05-29: 965 mg via INTRAVENOUS
  Filled 2013-05-29 (×4): qty 19.3

## 2013-05-29 MED ORDER — INSULIN ASPART 100 UNIT/ML ~~LOC~~ SOLN
3.0000 [IU] | Freq: Once | SUBCUTANEOUS | Status: AC
  Administered 2013-05-29: 3 [IU] via SUBCUTANEOUS

## 2013-05-29 MED ORDER — RIVAROXABAN 20 MG PO TABS
20.0000 mg | ORAL_TABLET | Freq: Every day | ORAL | Status: DC
  Administered 2013-05-30 – 2013-06-04 (×6): 20 mg via ORAL
  Filled 2013-05-29 (×8): qty 1

## 2013-05-29 NOTE — Progress Notes (Signed)
PT Cancellation Note  Patient Details Name: SRIMAN TALLY MRN: [id number] DOB: 04/09/1931   Cancelled Treatment:    Reason Eval/Treat Not Completed: Patient at procedure or test/unavailable.  Will attempt treatment as appropriate   Kennith Gain 05/29/2013, 1:02 PM

## 2013-05-29 NOTE — Progress Notes (Signed)
NEURO HOSPITALIST PROGRESS NOTE   SUBJECTIVE:                                                                                                                         Patient still having problems with the post-herpatic neuralgia but his mental state has improved. He is oriented to hospital, year, month. Able to follow all commands. Temperature is 97.4.  OBJECTIVE:                                                                                                                           Vital signs in last 24 hours: Temp:  [97.4 F (36.3 C)-98.7 F (37.1 C)] 97.4 F (36.3 C) (05/04 7564) Pulse Rate:  [74-90] 74 (05/04 0608) Resp:  [18-20] 20 (05/04 0608) BP: (95-115)/(45-69) 95/45 mmHg (05/04 0608) SpO2:  [96 %-97 %] 96 % (05/04 0608) Weight:  [96.571 kg (212 lb 14.4 oz)] 96.571 kg (212 lb 14.4 oz) (05/04 3329)  Intake/Output from previous day: 05/03 0701 - 05/04 0700 In: 200  Out: -  Intake/Output this shift:   Nutritional status: Cardiac  Past Medical History  Diagnosis Date  . Hypertension   . Gout     Acute episode involving ankle in 12/2011  . Hyperlipidemia   . Deep vein thrombophlebitis of left leg 11/2010    LLE; S/P THA  . Fasting hyperglycemia   . Stroke 08/2009; 07/2010    TIA in 07/2010  . H/O heat stroke 08/2009  . Prostate cancer 2013    adenocarcinoma; Gleason grade 8; PSA 9.45; EBRT 2013-14  . Renal cyst   . Syncope     01/2012: Negative pharmacologic stress nuclear  . PAF (paroxysmal atrial fibrillation)     a. first noted 01/2012 during admission for URI;  b. seen again 03/2012 after nstemi of unknown origin - pradaxa initiated.  Marland Kitchen CAD (coronary artery disease)     a. 01/2012 Elev Ti-->nonischemic MV;  b. 03/2012 NSTEMI/Cath: LM 50, LAD 50p/m, 70m/d, LCX nl, RI 60ost sup branch, RCA diff nonobs, EF 55%  . Radiation proctitis 02/2013  . NSTEMI (non-ST elevated myocardial infarction) 02/2013  . Diabetes mellitus without  complication      Neurologic Exam:   Mental  Status: Alert, oriented, thought content appropriate.  Speech fluent without evidence of aphasia.  Able to follow 3 step commands without difficulty. Cranial Nerves: II: Visual fields grossly normal, pupils equal, round, reactive to light and accommodation III,IV, VI: ptosis not present, extra-ocular motions intact bilaterally V,VII: smile symmetric, facial light touch sensation normal bilaterally VIII: hearing normal bilaterally IX,X: gag reflex present XI: bilateral shoulder shrug XII: midline tongue extension without atrophy or fasciculations  Motor: Right : Upper extremity   5/5    Left:     Upper extremity   5/5  Lower extremity   5/5     Lower extremity   5/5 Tone and bulk:normal tone throughout; no atrophy noted Sensory: Pinprick and light touch intact throughout, bilaterally Deep Tendon Reflexes:  Right: Upper Extremity   Left: Upper extremity   biceps (C-5 to C-6) 2/4   biceps (C-5 to C-6) 2/4 tricep (C7) 2/4    triceps (C7) 2/4 Brachioradialis (C6) 2/4  Brachioradialis (C6) 2/4  Lower Extremity Lower Extremity  quadriceps (L-2 to L-4) 1/4   quadriceps (L-2 to L-4) 1/4 Achilles (S1) 0/4   Achilles (S1) 0/4  Plantars: Mute bilaterally   Lab Results: Basic Metabolic Panel:  Recent Labs Lab 05/23/13 0546 05/26/13 1025 05/29/13 0527  NA 140 138 136*  K 3.7 3.5* 3.8  CL 101 100 97  CO2 25 25 27   GLUCOSE 86 95 156*  BUN 16 14 21   CREATININE 0.95 0.90 0.99  CALCIUM 9.4 9.1 9.3    Liver Function Tests:  Recent Labs Lab 05/29/13 0527  AST 12  ALT 14  ALKPHOS 57  BILITOT 0.3  PROT 5.4*  ALBUMIN 2.2*   No results found for this basename: LIPASE, AMYLASE,  in the last 168 hours No results found for this basename: AMMONIA,  in the last 168 hours  CBC:  Recent Labs Lab 05/23/13 0546 05/26/13 1025 05/29/13 0527  WBC 8.2 6.7 10.5  NEUTROABS  --  4.8  --   HGB 12.4* 11.5* 10.5*  HCT 37.5* 33.7* 31.3*   MCV 104.2* 102.7* 100.0  PLT 133* 123* 161    Cardiac Enzymes: No results found for this basename: CKTOTAL, CKMB, CKMBINDEX, TROPONINI,  in the last 168 hours  Lipid Panel: No results found for this basename: CHOL, TRIG, HDL, CHOLHDL, VLDL, LDLCALC,  in the last 168 hours  CBG:  Recent Labs Lab 05/28/13 1138 05/28/13 1731 05/28/13 2215 05/29/13 0028 05/29/13 0807  GLUCAP 272* 303* 334* 268* 139*    Microbiology: Results for orders placed during the hospital encounter of 05/19/13  MRSA PCR SCREENING     Status: None   Collection Time    05/19/13  7:37 PM      Result Value Ref Range Status   MRSA by PCR NEGATIVE  NEGATIVE Final   Comment:            The GeneXpert MRSA Assay (FDA     approved for NASAL specimens     only), is one component of a     comprehensive MRSA colonization     surveillance program. It is not     intended to diagnose MRSA     infection nor to guide or     monitor treatment for     MRSA infections.  CULTURE, BLOOD (ROUTINE X 2)     Status: None   Collection Time    05/23/13  4:25 PM      Result Value Ref Range Status  Specimen Description BLOOD RIGHT HAND   Final   Special Requests BOTTLES DRAWN AEROBIC ONLY 10CC   Final   Culture  Setup Time     Final   Value: 05/23/2013 22:25     Performed at [facility]   Culture     Final   Value: NO GROWTH 5 DAYS     Performed at [facility]   Report Status 05/29/2013 FINAL   Final  CLOSTRIDIUM DIFFICILE BY PCR     Status: None   Collection Time    05/26/13  4:39 AM      Result Value Ref Range Status   C difficile by pcr NEGATIVE  NEGATIVE Final    Coagulation Studies: No results found for this basename: LABPROT, INR,  in the last 72 hours  Imaging: Dg Chest Port 1 View  05/27/2013   CLINICAL DATA:  Short of breath.  EXAM: PORTABLE CHEST - 1 VIEW  COMPARISON:  05/23/2013  FINDINGS: The cardiac silhouette remains mildly enlarged. No gross mediastinal or hilar masses. Lung  volumes are relatively low. There is bronchovascular crowding. There is no convincing infiltrate or edema. No pleural effusion or pneumothorax.  IMPRESSION: No acute cardiopulmonary disease. Stable appearance from the prior exam.   Electronically Signed   By: Lajean Manes M.D.   On: 05/27/2013 20:11       MEDICATIONS                                                                                                                        Scheduled: . allopurinol  300 mg Oral q morning - 10a  . amitriptyline  12.5 mg Oral QHS  . atorvastatin  10 mg Oral q1800  . calcium-vitamin D  1 tablet Oral QPM  . diltiazem  180 mg Oral q morning - 10a  . docusate sodium  100 mg Oral BID  . feeding supplement (GLUCERNA SHAKE)  237 mL Oral BID PC  . furosemide  40 mg Intravenous Daily  . glipiZIDE  2.5 mg Oral Q breakfast  . insulin aspart  0-15 Units Subcutaneous TID WC  . lactulose  20 g Oral BID  . lidocaine  1 patch Transdermal Q24H  . metoprolol  25 mg Oral BID  . multivitamin with minerals  1 tablet Oral QPM  . polyethylene glycol  17 g Oral BID  . sodium chloride  3 mL Intravenous Q12H    ASSESSMENT/PLAN:  80 YO male with history of HSV zoster, now completed course of antivirals. On admission was febrile with no source of fever found and AMS. Currently he is a febrile and more lucid. Due to fever of unknown origin LP was attempted but unable.  Awaiting LP by fluoroscopy. Also awaiting EEG.   Will continue to follow.    Assessment and plan discussed with with attending physician and they are in agreement.    Etta Quill PA-C [facility] [phone]  05/29/2013, 10:47 AM

## 2013-05-29 NOTE — Progress Notes (Signed)
Inpatient Diabetes Program Recommendations  AACE/ADA: New Consensus Statement on Inpatient Glycemic Control (2013)  Target Ranges:  Prepandial:   less than 140 mg/dL      Peak postprandial:   less than 180 mg/dL (1-2 hours)      Critically ill patients:  140 - 180 mg/dL     Results for JJ, ENYEART (MRN [id number]) as of 05/29/2013 13:12  Ref. Range 05/28/2013 07:53 05/28/2013 11:38 05/28/2013 17:31 05/28/2013 22:15  Glucose-Capillary Latest Range: 70-99 mg/dL 112 (H) 272 (H) 303 (H) 334 (H)    Results for LINN, GOETZE (MRN [id number]) as of 05/29/2013 13:12  Ref. Range 05/29/2013 08:07 05/29/2013 12:29  Glucose-Capillary Latest Range: 70-99 mg/dL 139 (H) 204 (H)     Patient having issues with postprandial glucose elevations.  Likely due to Prednisone therapy.   MD- Please add Novolog meal coverage- Novolog 4 units tid with meals   Will follow Wyn Quaker RN, MSN, CDE Diabetes Coordinator Inpatient Diabetes Program Team Pager: [phone] (8a-10p)

## 2013-05-29 NOTE — Procedures (Signed)
ELECTROENCEPHALOGRAM REPORT  Patient: [name]       Room #: 2K02 EEG No. ID: [id number] Age: 80 y.o.        Sex: male Referring Physician: Wyline Copas Report Date:  05/29/2013        Interpreting Physician: Wallie Char  History: [name] [name] is an 80 y.o. male with altered mental status with waxing and waning confusion, as well as intermittent low-grade fever.  Indications for study:  Assess severity of encephalopathy; rule out possible focal seizure activity.  Technique: This is an 18 channel routine scalp EEG performed at the bedside with bipolar and monopolar montages arranged in accordance to the international 10/20 system of electrode placement.   Description: This EEG recording was performed in wakefulness. Patient was noted to be confused at the time of the study. Eyebrow activity consisted of low amplitude diffuse symmetrical irregular delta activity with superimposed 8-9 Hz delta rhythm recorded from the posterior head region primarily. Photic stimulation was not performed. Hyperventilation was not performed. No epileptiform discharges were recorded. There were no areas of disproportionate focal slowing of cerebral activity.  Interpretation: CT shows minimal generalized nonspecific slowing of cerebral activity. No evidence of an epileptic disorder was seen.   Rush Farmer M.D. [facility] [phone]

## 2013-05-29 NOTE — Progress Notes (Signed)
EEG completed; results pending.    

## 2013-05-29 NOTE — Procedures (Signed)
EXAM: DIAGNOSTIC LUMBAR PUNCTURE UNDER FLUOROSCOPIC GUIDANCE FLUOROSCOPY TIME: [1 min 0 seconds.] PROCEDURE: Informed consent was obtained from the patient prior to the procedure, including potential complications of headache, allergy, and pain. With the patient prone, the lower back was prepped with Betadine. 1% Lidocaine was used for local anesthesia. Lumbar puncture was performed at the [L3-4] level using a [20 in] gauge needle with return of [clear] CSF with an opening pressure of [less than 9] cm water. [12]ml of CSF were obtained for laboratory studies. The patient tolerated the procedure well and there were no apparent complications. IMPRESSION: [Successful lumbar puncture under fluoroscopy.]

## 2013-05-29 NOTE — Progress Notes (Addendum)
PROGRESS NOTE  [name] OVF:[id number] DOB: March 26, 1931 DOA: 05/19/2013 PCP: Leonides Grills, MD   [name] [name] is a 80 y.o. male  With a history of hypertension, hyperlipidemia, left leg DVT, coronary artery disease, history of rectal bleeding, prostate cancer status post radiation, presents emergency department for bright red blood per rectum. Patient states this has been ongoing for approximately 4 days. He had a similar episode in February of 2015 at which point he underwent a sigmoidoscopy which showed bleeding from a dilated capillary, which was due to self disimpaction. Patient was recently admitted on 05/07/2013 weakness and pain control shingles. He is currently taking Valtrex and prednisone. Patient is currently in pain. He only complains of shaking and weakness.   Assessment/Plan:  Painless hematochezia.  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 S/p sigmoidoscopy show no other source of bleeding about 6 weeks ago.  -started Miralax 2 to 3 times a day - changed to PRN as pt started having loose stools -Hgb 10.5 currently (was 13 on 4/24). Hemodynamically stable. Cont to follow closely -Trial of anticoagulation per below - watch for bleed  Fever -U/A clear -Chest x ray unremarkable -blood culture x 2 - prelim neg thus far -low grade temps in AM - 99.7 this AM -Tmax 102.1 on 4/29 -Started empiric acyclovir (see below)  Shingles  -treated but still painful -trying to minimize narcotics as patient it makes patient lethargic -added lidocaine patch -added amytrptyline -neurontin held  Hypertension  -Continue metoprolol and Lasix   Diabetes mellitus with neuropathy  -Continue glipizide  -Continue gabapentin for neuropathy - decreased dose per below -Will place patient on insulin sliding scale with CBG monitoring  -Last hemoglobin A1c November 2014 6.6  Baseline at home appears to be sitting in chair and pivoting to toilet ?AMS here- prob multifactorial vs  polypharmacy? -Neurology consulted -Recommended MRI - unremarkable -Was unable to obtain bedside LP- s/p fluro guided LP -CSF with 18wbc, 12rbc, 96 glucose, 51 protein -Empiric acyclovir was started 5/4 -await csf cultures -Consult ID per Neuro recs  Chronic Anticoagulation - pt with hx of CVA in setting of afib - Pt with documented hx of LLE DVT s/p hip surgery - Pt is now s/p LP - discussed with fluro - Resumed xarelto - observe closely for bleed  Code Status: DNR Family Communication: family at bedside Disposition Plan: PT eval- recs SNF but family has 24 hour care givers and want to take home  Consultants:  GI  Neurology  Procedures:  MRI brain 4/28  HPI/Subjective: Today, pt less alert. Low grade temp in the AM  Objective: Filed Vitals:   05/29/13 0608  BP: 95/45  Pulse: 74  Temp: 97.4 F (36.3 C)  Resp: 20    Intake/Output Summary (Last 24 hours) at 05/29/13 1439 Last data filed at 05/29/13 1255  Gross per 24 hour  Intake      0 ml  Output    850 ml  Net   -850 ml   Filed Weights   05/27/13 1813 05/28/13 0552 05/29/13 0608  Weight: 105.235 kg (232 lb) 102.241 kg (225 lb 6.4 oz) 96.571 kg (212 lb 14.4 oz)    Exam:  General: easily arousable, more alert this AM, in nad Cardiovascular: S1 S2 auscultated, no rubs, murmurs or gallops. Regular rate and rhythm.  Respiratory: Clear to auscultation bilaterally with equal chest rise  Abdomen: Soft, obese, nontender, nondistended, + bowel sounds  Neuro: moves all 4 ext- eyes closed Skin: scarring on right thigh  Data Reviewed: Basic Metabolic Panel:  Recent Labs Lab 05/23/13 0546 05/26/13 1025 05/29/13 0527  NA 140 138 136*  K 3.7 3.5* 3.8  CL 101 100 97  CO2 25 25 27   GLUCOSE 86 95 156*  BUN 16 14 21   CREATININE 0.95 0.90 0.99  CALCIUM 9.4 9.1 9.3   Liver Function Tests:  Recent Labs Lab 05/29/13 0527  AST 12  ALT 14  ALKPHOS 57  BILITOT 0.3  PROT 5.4*  ALBUMIN 2.2*   No results  found for this basename: LIPASE, AMYLASE,  in the last 168 hours No results found for this basename: AMMONIA,  in the last 168 hours CBC:  Recent Labs Lab 05/23/13 0546 05/26/13 1025 05/29/13 0527  WBC 8.2 6.7 10.5  NEUTROABS  --  4.8  --   HGB 12.4* 11.5* 10.5*  HCT 37.5* 33.7* 31.3*  MCV 104.2* 102.7* 100.0  PLT 133* 123* 161   Cardiac Enzymes: No results found for this basename: CKTOTAL, CKMB, CKMBINDEX, TROPONINI,  in the last 168 hours BNP (last 3 results) No results found for this basename: PROBNP,  in the last 8760 hours CBG:  Recent Labs Lab 05/28/13 1731 05/28/13 2215 05/29/13 0028 05/29/13 0807 05/29/13 1229  GLUCAP 303* 334* 268* 139* 204*    Recent Results (from the past 240 hour(s))  MRSA PCR SCREENING     Status: None   Collection Time    05/19/13  7:37 PM      Result Value Ref Range Status   MRSA by PCR NEGATIVE  NEGATIVE Final   Comment:            The GeneXpert MRSA Assay (FDA     approved for NASAL specimens     only), is one component of a     comprehensive MRSA colonization     surveillance program. It is not     intended to diagnose MRSA     infection nor to guide or     monitor treatment for     MRSA infections.  CULTURE, BLOOD (ROUTINE X 2)     Status: None   Collection Time    05/23/13  4:25 PM      Result Value Ref Range Status   Specimen Description BLOOD RIGHT HAND   Final   Special Requests BOTTLES DRAWN AEROBIC ONLY 10CC   Final   Culture  Setup Time     Final   Value: 05/23/2013 22:25     Performed at [facility]   Culture     Final   Value: NO GROWTH 5 DAYS     Performed at [facility]   Report Status 05/29/2013 FINAL   Final  CLOSTRIDIUM DIFFICILE BY PCR     Status: None   Collection Time    05/26/13  4:39 AM      Result Value Ref Range Status   C difficile by pcr NEGATIVE  NEGATIVE Final     Studies: Dg Chest Port 1 View  05/27/2013   CLINICAL DATA:  Short of breath.  EXAM: PORTABLE CHEST - 1 VIEW   COMPARISON:  05/23/2013  FINDINGS: The cardiac silhouette remains mildly enlarged. No gross mediastinal or hilar masses. Lung volumes are relatively low. There is bronchovascular crowding. There is no convincing infiltrate or edema. No pleural effusion or pneumothorax.  IMPRESSION: No acute cardiopulmonary disease. Stable appearance from the prior exam.   Electronically Signed   By: Lajean Manes M.D.   On: 05/27/2013  20:11    Scheduled Meds: . allopurinol  300 mg Oral q morning - 10a  . amitriptyline  12.5 mg Oral QHS  . atorvastatin  10 mg Oral q1800  . calcium-vitamin D  1 tablet Oral QPM  . diltiazem  180 mg Oral q morning - 10a  . docusate sodium  100 mg Oral BID  . feeding supplement (GLUCERNA SHAKE)  237 mL Oral BID PC  . furosemide  40 mg Intravenous Daily  . glipiZIDE  2.5 mg Oral Q breakfast  . insulin aspart  0-15 Units Subcutaneous TID WC  . lactulose  20 g Oral BID  . lidocaine  1 patch Transdermal Q24H  . metoprolol  25 mg Oral BID  . multivitamin with minerals  1 tablet Oral QPM  . polyethylene glycol  17 g Oral BID  . sodium chloride  3 mL Intravenous Q12H   Continuous Infusions:  Antibiotics Given (last 72 hours)   None     Principal Problem:   Lower GI bleed Active Problems:   Hypertension   Gout   Hyperlipidemia   Deep vein thrombophlebitis of left leg   Cerebrovascular event   Prostate cancer   Atrial fibrillation   GI bleed   Paroxysmal a-fib  Time spent: 35 min  [facility] Pager [phone]. If 7PM-7AM, please contact night-coverage at [email or web address], password [facility] 05/29/2013, 2:39 PM  LOS: 10 days

## 2013-05-30 ENCOUNTER — Inpatient Hospital Stay (HOSPITAL_COMMUNITY): Payer: Medicare Other

## 2013-05-30 DIAGNOSIS — R509 Fever, unspecified: Secondary | ICD-10-CM

## 2013-05-30 DIAGNOSIS — N179 Acute kidney failure, unspecified: Secondary | ICD-10-CM

## 2013-05-30 DIAGNOSIS — Z7901 Long term (current) use of anticoagulants: Secondary | ICD-10-CM

## 2013-05-30 LAB — GLUCOSE, CAPILLARY
GLUCOSE-CAPILLARY: 97 mg/dL (ref 70–99)
Glucose-Capillary: 100 mg/dL — ABNORMAL HIGH (ref 70–99)
Glucose-Capillary: 83 mg/dL (ref 70–99)
Glucose-Capillary: 115 mg/dL — ABNORMAL HIGH (ref 70–99)

## 2013-05-30 LAB — URINALYSIS, ROUTINE W REFLEX MICROSCOPIC
Bilirubin Urine: NEGATIVE
Glucose, UA: NEGATIVE mg/dL
Hgb urine dipstick: NEGATIVE
Ketones, ur: NEGATIVE mg/dL
LEUKOCYTES UA: NEGATIVE
NITRITE: NEGATIVE
PH: 7.5 (ref 5.0–8.0)
Protein, ur: NEGATIVE mg/dL
SPECIFIC GRAVITY, URINE: 1.011 (ref 1.005–1.030)
Urobilinogen, UA: 0.2 mg/dL (ref 0.0–1.0)

## 2013-05-30 LAB — URIC ACID: Uric Acid, Serum: 7.6 mg/dL (ref 4.0–7.8)

## 2013-05-30 LAB — LIPASE, BLOOD: Lipase: 11 U/L (ref 11–59)

## 2013-05-30 LAB — AMYLASE: Amylase: 20 U/L (ref 0–105)

## 2013-05-30 MED ORDER — DEXTROSE 5 % IV SOLN
700.0000 mg | Freq: Three times a day (TID) | INTRAVENOUS | Status: DC
  Administered 2013-05-30 – 2013-06-01 (×8): 700 mg via INTRAVENOUS
  Filled 2013-05-30 (×9): qty 14

## 2013-05-30 MED ORDER — SODIUM CHLORIDE 0.9 % IV SOLN
2000.0000 mg | INTRAVENOUS | Status: AC
  Administered 2013-05-30: 2000 mg via INTRAVENOUS
  Filled 2013-05-30: qty 2000

## 2013-05-30 MED ORDER — SODIUM CHLORIDE 0.9 % IV SOLN
1.0000 g | INTRAVENOUS | Status: DC
  Administered 2013-05-30 – 2013-05-31 (×4): 1 g via INTRAVENOUS
  Filled 2013-05-30 (×8): qty 1000

## 2013-05-30 MED ORDER — DEXTROSE 5 % IV SOLN
2.0000 g | Freq: Two times a day (BID) | INTRAVENOUS | Status: DC
  Administered 2013-05-30 – 2013-06-02 (×6): 2 g via INTRAVENOUS
  Filled 2013-05-30 (×7): qty 2

## 2013-05-30 MED ORDER — VANCOMYCIN HCL IN DEXTROSE 1-5 GM/200ML-% IV SOLN
1000.0000 mg | Freq: Two times a day (BID) | INTRAVENOUS | Status: DC
  Administered 2013-05-31 – 2013-06-02 (×5): 1000 mg via INTRAVENOUS
  Filled 2013-05-30 (×9): qty 200

## 2013-05-30 NOTE — Progress Notes (Signed)
PROGRESS NOTE  VUE PAVON OEV:[id number] DOB: [birth year] DOA: 05/19/2013 PCP: Leonides Grills, MD   [name] is a 80 y.o. male  With a history of hypertension, hyperlipidemia, left leg DVT, coronary artery disease, history of rectal bleeding, prostate cancer status post radiation, presents emergency department for bright red blood per rectum. Patient states this has been ongoing for approximately 4 days. He had a similar episode in February of 2015 at which point he underwent a sigmoidoscopy which showed bleeding from a dilated capillary, which was due to self disimpaction. Patient was recently admitted on 05/07/2013 weakness and pain control shingles. He is currently taking Valtrex and prednisone. Patient is currently in pain. He only complains of shaking and weakness.  GI was consulted initially. Initially felt for no intervention if hgb remained stable. Pt later became febrile with tmax of 102 with ms changes. Pan cultures were unremarkable. Neurology was consulted who recommended LP. LP was notable for a wbc of 18. Empiric acyclovir was started. ID was consulted with recs for added vanc, rocephin, and amp.   Assessment/Plan:  Painless hematochezia.  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 S/p sigmoidoscopy show no other source of bleeding about 6 weeks ago.  -started Miralax 2 to 3 times a day - changed to PRN as pt started having loose stools -Hgb 10.5 currently (was 13 on 4/24). Hemodynamically stable. Cont to follow closely -Trial of anticoagulation per below - watch for bleed  Fever -U/A clear -Chest x ray unremarkable -blood culture x 2 - prelim neg thus far -low grade temps in AM - 99.7 this AM -Tmax 102.1 on 4/29 -Started empiric acyclovir (see below)  Shingles  -treated but still painful -trying to minimize narcotics as patient it makes patient lethargic -added lidocaine patch -added amytrptyline -neurontin held  Hypertension  -Continue metoprolol and Lasix    Diabetes mellitus with neuropathy  -Continue glipizide  -Continue gabapentin for neuropathy - decreased dose per below -Will place patient on insulin sliding scale with CBG monitoring  -Last hemoglobin A1c November 2014 6.6  Baseline at home appears to be sitting in chair and pivoting to toilet ?AMS here- prob multifactorial vs polypharmacy? -Neurology consulted -Recommended MRI - unremarkable -Was unable to obtain bedside LP- s/p fluro guided LP -CSF with 18wbc, 12rbc, 96 glucose, 51 protein -Empiric acyclovir was started 5/4 -await csf cultures -Consult ID per Neuro recs  Chronic Anticoagulation - pt with hx of CVA in setting of afib - Pt with documented hx of LLE DVT s/p hip surgery - Pt is now s/p LP - discussed with fluro - Resumed xarelto - observe closely for bleed  Code Status: DNR Family Communication: family at bedside Disposition Plan: PT eval- recs SNF but family has 24 hour care givers and want to take home  Consultants:  GI  Neurology  Procedures:  MRI brain 4/28  HPI/Subjective: Today, pt less alert. Low grade temp in the AM  Objective: Filed Vitals:   05/30/13 1320  BP: 133/66  Pulse: 107  Temp: 98.6 F (37 C)  Resp: 18    Intake/Output Summary (Last 24 hours) at 05/30/13 1947 Last data filed at 05/30/13 1320  Gross per 24 hour  Intake      0 ml  Output    600 ml  Net   -600 ml   Filed Weights   05/28/13 0552 05/29/13 0608 05/30/13 0500  Weight: 102.241 kg (225 lb 6.4 oz) 96.571 kg (212 lb 14.4 oz) 99.61 kg (219 lb 9.6 oz)  Exam:  General: easily arousable, more alert this AM, in nad Cardiovascular: S1 S2 auscultated, no rubs, murmurs or gallops. Regular rate and rhythm.  Respiratory: Clear to auscultation bilaterally with equal chest rise  Abdomen: Soft, obese, nontender, nondistended, + bowel sounds  Neuro: moves all 4 ext- eyes closed Skin: scarring on right thigh  Data Reviewed: Basic Metabolic Panel:  Recent Labs Lab  05/26/13 1025 05/29/13 0527  NA 138 136*  K 3.5* 3.8  CL 100 97  CO2 25 27  GLUCOSE 95 156*  BUN 14 21  CREATININE 0.90 0.99  CALCIUM 9.1 9.3   Liver Function Tests:  Recent Labs Lab 05/29/13 0527  AST 12  ALT 14  ALKPHOS 57  BILITOT 0.3  PROT 5.4*  ALBUMIN 2.2*    Recent Labs Lab 05/30/13 1724  LIPASE 11  AMYLASE 20   No results found for this basename: AMMONIA,  in the last 168 hours CBC:  Recent Labs Lab 05/26/13 1025 05/29/13 0527  WBC 6.7 10.5  NEUTROABS 4.8  --   HGB 11.5* 10.5*  HCT 33.7* 31.3*  MCV 102.7* 100.0  PLT 123* 161   Cardiac Enzymes: No results found for this basename: CKTOTAL, CKMB, CKMBINDEX, TROPONINI,  in the last 168 hours BNP (last 3 results) No results found for this basename: PROBNP,  in the last 8760 hours CBG:  Recent Labs Lab 05/29/13 1752 05/29/13 2131 05/30/13 0730 05/30/13 1141 05/30/13 1802  GLUCAP 146* 177* 97 100* 83    Recent Results (from the past 240 hour(s))  CULTURE, BLOOD (ROUTINE X 2)     Status: None   Collection Time    05/23/13  4:25 PM      Result Value Ref Range Status   Specimen Description BLOOD RIGHT HAND   Final   Special Requests BOTTLES DRAWN AEROBIC ONLY 10CC   Final   Culture  Setup Time     Final   Value: 05/23/2013 22:25     Performed at [facility]   Culture     Final   Value: NO GROWTH 5 DAYS     Performed at [facility]   Report Status 05/29/2013 FINAL   Final  CLOSTRIDIUM DIFFICILE BY PCR     Status: None   Collection Time    05/26/13  4:39 AM      Result Value Ref Range Status   C difficile by pcr NEGATIVE  NEGATIVE Final  CSF CULTURE     Status: None   Collection Time    05/29/13  2:20 PM      Result Value Ref Range Status   Specimen Description CSF   Final   Special Requests NONE   Final   Gram Stain     Final   Value: WBC PRESENT, PREDOMINANTLY MONONUCLEAR     NO ORGANISMS SEEN     CYTOSPIN Performed at [facility]     Performed at  [facility]   Culture     Final   Value: NO GROWTH 1 DAY     Performed at [facility]   Report Status PENDING   Incomplete  GRAM STAIN     Status: None   Collection Time    05/29/13  2:20 PM      Result Value Ref Range Status   Specimen Description CSF   Final   Special Requests NONE   Final   Gram Stain     Final   Value: WBC  PRESENT, PREDOMINANTLY MONONUCLEAR     NO ORGANISMS SEEN     CYTOSPIN SLIDE   Report Status 05/29/2013 FINAL   Final     Studies: Dg Fluoro Guide Lumbar Puncture  05/29/2013   CLINICAL DATA:  Mental status change with fevers.  EXAM: DIAGNOSTIC LUMBAR PUNCTURE UNDER FLUOROSCOPIC GUIDANCE  FLUOROSCOPY TIME:  1 min 0 seconds.  PROCEDURE: Informed consent was obtained from the patient prior to the procedure, including potential complications of headache, allergy, and pain. With the patient prone, the lower back was prepped with Betadine. 1% Lidocaine was used for local anesthesia. Lumbar puncture was performed at the L3-4 level using a 20 in gauge needle with return of clear CSF with an opening pressure of less than 9 cm water. 78ml of CSF were obtained for laboratory studies. The patient tolerated the procedure well and there were no apparent complications.  IMPRESSION: Successful lumbar puncture under fluoroscopy.   Electronically Signed   By: Lorin Picket M.D.   On: 05/29/2013 14:39    Scheduled Meds: . acyclovir  700 mg Intravenous Q8H  . allopurinol  300 mg Oral q morning - 10a  . amitriptyline  12.5 mg Oral QHS  . ampicillin (OMNIPEN) IV  1 g Intravenous 6 times per day  . atorvastatin  10 mg Oral q1800  . calcium-vitamin D  1 tablet Oral QPM  . cefTRIAXone (ROCEPHIN)  IV  2 g Intravenous Q12H  . diltiazem  180 mg Oral q morning - 10a  . docusate sodium  100 mg Oral BID  . feeding supplement (GLUCERNA SHAKE)  237 mL Oral BID PC  . furosemide  40 mg Intravenous Daily  . glipiZIDE  2.5 mg Oral Q breakfast  . insulin aspart  0-15 Units  Subcutaneous TID WC  . lidocaine  1 patch Transdermal Q24H  . metoprolol  25 mg Oral BID  . multivitamin with minerals  1 tablet Oral QPM  . polyethylene glycol  17 g Oral BID  . rivaroxaban  20 mg Oral Q supper  . sodium chloride  3 mL Intravenous Q12H  . vancomycin  2,000 mg Intravenous STAT  . [START ON 05/31/2013] vancomycin  1,000 mg Intravenous Q12H   Continuous Infusions:  Antibiotics Given (last 72 hours)   Date/Time Action Medication Dose Rate   05/29/13 2017 Given   acyclovir (ZOVIRAX) 965 mg in dextrose 5 % 150 mL IVPB 965 mg 169.3 mL/hr   05/30/13 0616 Given   acyclovir (ZOVIRAX) 700 mg in dextrose 5 % 100 mL IVPB 700 mg 114 mL/hr   05/30/13 1338 Given   acyclovir (ZOVIRAX) 700 mg in dextrose 5 % 100 mL IVPB 700 mg 114 mL/hr   05/30/13 1802 Given   ampicillin (OMNIPEN) 1 g in sodium chloride 0.9 % 50 mL IVPB 1 g 150 mL/hr   05/30/13 1803 Given   vancomycin (VANCOCIN) 2,000 mg in sodium chloride 0.9 % 500 mL IVPB 2,000 mg 250 mL/hr     Principal Problem:   Lower GI bleed Active Problems:   Hypertension   Gout   Hyperlipidemia   Deep vein thrombophlebitis of left leg   Cerebrovascular event   Prostate cancer   Atrial fibrillation   GI bleed   Paroxysmal a-fib  Time spent: 35 min  [facility] Pager [phone]. If 7PM-7AM, please contact night-coverage at [email or web address], password [facility] 05/30/2013, 7:47 PM  LOS: 11 days

## 2013-05-30 NOTE — Progress Notes (Signed)
PT Cancellation Note  Patient Details Name: [name] MRN: [id number] DOB: 06/15/1931   Cancelled Treatment:    Reason Eval/Treat Not Completed: Patient not medically ready;Medical issues which prohibited therapy.  Pt unable to stay aroused or coherent, unable to participate in skilled PT at this time.  RN and PA aware.   [name] 05/30/2013, 11:28 AM

## 2013-05-30 NOTE — Consult Note (Signed)
[name] for Infectious Disease  Date of Admission:  05/19/2013  Date of Consult:  05/30/2013  Reason for Consult: Mental Status Change, fever Referring Physician: Wyline [name]  Impression/Recommendation Mental Status Change Fever  Would Add vanco, ceftraixone, amp Await CSF Cx Check amylase lipase Check LE doppler Check uric acid D/c all non-essential meds  Comment- Not clear what has caused his mental status change. He has defervesced. It would seem unlikely that he would develop VZV encephalitis while on valtrex, as well it would seem unlikely that he would develop meningitis in hospital.  Consider repeat MRI of head.   Thank you so much for this interesting consult,   [name] (pager) [phone] [email or web address]  [name] [name] is an 80 y.o. male.  HPI: 80 yo M with hx of shingles 05-07-13 who returned to hospital on 05-19-13 with BRBPR. He was still on prednisone and valtrex. He was felt to have exacerbation of radiation proctitis.  By 4-28 he developed fever 102 and confusion. He was eva by neuro- LP deferred as no evidence of HSV on MRI. However his fever recurred and he underwent LP on 05-29-13: 18 WBC (87% L) , 12 RBC, Prot 51, Glc 96.  He was started on Acyclovir on 05-29-13.  He appears to have been afebrile since 05-25-13.   Past Medical History  Diagnosis Date  . Hypertension   . Gout     Acute episode involving ankle in 12/2011  . Hyperlipidemia   . Deep vein thrombophlebitis of left leg 11/2010    LLE; S/P THA  . Fasting hyperglycemia   . Stroke 08/2009; 07/2010    TIA in 07/2010  . H/O heat stroke 08/2009  . Prostate cancer 2013    adenocarcinoma; Gleason grade 8; PSA 9.45; EBRT 2013-14  . Renal cyst   . Syncope     01/2012: Negative pharmacologic stress nuclear  . PAF (paroxysmal atrial fibrillation)     a. first noted 01/2012 during admission for URI;  b. seen again 03/2012 after nstemi of unknown origin - pradaxa initiated.  Marland Kitchen CAD  (coronary artery disease)     a. 01/2012 Elev Ti-->nonischemic MV;  b. 03/2012 NSTEMI/Cath: LM 50, LAD 50p/m, 8md, LCX nl, RI 60ost sup branch, RCA diff nonobs, EF 55%  . Radiation proctitis 02/2013  . NSTEMI (non-ST elevated myocardial infarction) 02/2013  . Diabetes mellitus without complication     Past Surgical History  Procedure Laterality Date  . Hip arthroplasty  12/08/2010    ARTHROPLASTY BIPOLAR HIP;  Surgeon-Olin; Left  . 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 I&d extremity  05/28/2011    Procedure: IRRIGATION AND DEBRIDEMENT EXTREMITY;  Surgeon: FLinna Hoff MD;  [facility]  Service: Orthopedics;  Laterality: Right;  I & D Right hand/wrist  . Colonoscopy   07/18/2003    RMR: Rectum internal hemorrhoids.  Otherwise, normal rectum/ Left-sided diverticula.  Remainder of colonic mucosa-nl  . Flexible sigmoidoscopy N/A 03/18/2013    Procedure: FLEXIBLE SIGMOIDOSCOPY;  Surgeon: SWonda Horner MD;  [facility];  Service: Endoscopy;  Laterality: N/A;  . Cardiac catheterization  03/2012    non-obstructive ASCAD, medial management only, pt does not want repeat cath     Allergies  Allergen Reactions  . Tetanus Toxoids Swelling    Also allergic to "high powered pain medications."  . Oxycodone     Makes him loopy  Medications:  Scheduled: . acyclovir  700 mg Intravenous Q8H  . allopurinol  300 mg Oral q morning - 10a  . amitriptyline  12.5 mg Oral QHS  . atorvastatin  10 mg Oral q1800  . calcium-vitamin D  1 tablet Oral QPM  . diltiazem  180 mg Oral q morning - 10a  . docusate sodium  100 mg Oral BID  . feeding supplement (GLUCERNA SHAKE)  237 mL Oral BID PC  . furosemide  40 mg Intravenous Daily  . glipiZIDE  2.5 mg Oral Q breakfast  . insulin aspart  0-15 Units Subcutaneous TID WC  . lactulose  20 g Oral BID  . lidocaine  1 patch Transdermal Q24H  . metoprolol  25 mg Oral BID  . multivitamin with minerals  1 tablet Oral  QPM  . polyethylene glycol  17 g Oral BID  . rivaroxaban  20 mg Oral Q supper  . sodium chloride  3 mL Intravenous Q12H    Abtx:  Anti-infectives   Start     Dose/Rate Route Frequency Ordered Stop   05/30/13 0600  acyclovir (ZOVIRAX) 700 mg in dextrose 5 % 100 mL IVPB     700 mg 114 mL/hr over 60 Minutes Intravenous Every 8 hours 05/30/13 0217     05/29/13 1800  acyclovir (ZOVIRAX) 965 mg in dextrose 5 % 150 mL IVPB  Status:  Discontinued     10 mg/kg  96.6 kg 169.3 mL/hr over 60 Minutes Intravenous Every 8 hours 05/29/13 1702 05/30/13 0217   05/19/13 2200  valACYclovir (VALTREX) tablet 500 mg  Status:  Discontinued     500 mg Oral 2 times daily 05/19/13 1726 05/20/13 1103      Total days of antibiotics; 2 (acyclovir)          Social History:  reports that he quit smoking about 65 years ago. His smoking use included Cigarettes and Cigars. He smoked 0.00 packs per day. His smokeless tobacco use includes Chew. He reports that he does not drink alcohol or use illicit drugs.  Family History  Problem Relation Age of Onset  . Prostate cancer Brother   . Colon cancer Sister 27  . Diabetes Mellitus II Father   . Stroke Father     General ROS: unobtainable. pt not able to provide.   Blood pressure 133/66, pulse 107, temperature 98.6 F (37 C), temperature source Axillary, resp. rate 18, height _0  (1.727 m), weight 99.61 kg (219 lb 9.6 oz), SpO2 96.00%. General appearance: delirious, fatigued and no distress Eyes: negative findings: no photophobia Throat: normal findings: oropharynx pink & moist without lesions or evidence of thrush Neck: no adenopathy, supple, symmetrical, trachea midline and resists movement Lungs: clear to auscultation bilaterally Heart: regular rate and rhythm Abdomen: normal findings: bowel sounds normal and soft, non-tender Extremities: edema none and L ankle warmer than R   Results for orders placed during the hospital encounter of 05/19/13 (from the  past 48 hour(s))  GLUCOSE, CAPILLARY     Status: Abnormal   Collection Time    05/28/13  5:31 PM      Result Value Ref Range   Glucose-Capillary 303 (*) 70 - 99 mg/dL   Comment 1 Notify RN    GLUCOSE, CAPILLARY     Status: Abnormal   Collection Time    05/28/13 10:15 PM      Result Value Ref Range   Glucose-Capillary 334 (*) 70 - 99 mg/dL   Comment 1 Notify RN  Comment 2 Documented in Chart    GLUCOSE, CAPILLARY     Status: Abnormal   Collection Time    05/29/13 12:28 AM      Result Value Ref Range   Glucose-Capillary 268 (*) 70 - 99 mg/dL   Comment 1 Repeat Test     Comment 2 Notify RN    CBC     Status: Abnormal   Collection Time    05/29/13  5:27 AM      Result Value Ref Range   WBC 10.5  4.0 - 10.5 K/uL   RBC 3.13 (*) 4.22 - 5.81 MIL/uL   Hemoglobin 10.5 (*) 13.0 - 17.0 g/dL   HCT 31.3 (*) 39.0 - 52.0 %   MCV 100.0  78.0 - 100.0 fL   MCH 33.5  26.0 - 34.0 pg   MCHC 33.5  30.0 - 36.0 g/dL   RDW 16.4 (*) 11.5 - 15.5 %   Platelets 161  150 - 400 K/uL  COMPREHENSIVE METABOLIC PANEL     Status: Abnormal   Collection Time    05/29/13  5:27 AM      Result Value Ref Range   Sodium 136 (*) 137 - 147 mEq/L   Potassium 3.8  3.7 - 5.3 mEq/L   Chloride 97  96 - 112 mEq/L   CO2 27  19 - 32 mEq/L   Glucose, Bld 156 (*) 70 - 99 mg/dL   BUN 21  6 - 23 mg/dL   Creatinine, Ser 0.99  0.50 - 1.35 mg/dL   Calcium 9.3  8.4 - 10.5 mg/dL   Total Protein 5.4 (*) 6.0 - 8.3 g/dL   Albumin 2.2 (*) 3.5 - 5.2 g/dL   AST 12  0 - 37 U/L   ALT 14  0 - 53 U/L   Alkaline Phosphatase 57  39 - 117 U/L   Total Bilirubin 0.3  0.3 - 1.2 mg/dL   GFR calc non Af Amer 76 (*) >90 mL/min   GFR calc Af Amer 88 (*) >90 mL/min   Comment: (NOTE)     The eGFR has been calculated using the CKD EPI equation.     This calculation has not been validated in all clinical situations.     eGFR's persistently <90 mL/min signify possible Chronic Kidney     Disease.  GLUCOSE, CAPILLARY     Status: Abnormal    Collection Time    05/29/13  8:07 AM      Result Value Ref Range   Glucose-Capillary 139 (*) 70 - 99 mg/dL  GLUCOSE, CAPILLARY     Status: Abnormal   Collection Time    05/29/13 12:29 PM      Result Value Ref Range   Glucose-Capillary 204 (*) 70 - 99 mg/dL  PROTEIN AND GLUCOSE, CSF     Status: Abnormal   Collection Time    05/29/13  2:20 PM      Result Value Ref Range   Glucose, CSF 96 (*) 43 - 76 mg/dL   Total  Protein, CSF 51 (*) 15 - 45 mg/dL  CSF CULTURE     Status: None   Collection Time    05/29/13  2:20 PM      Result Value Ref Range   Specimen Description CSF     Special Requests NONE     Gram Stain       Value: WBC PRESENT, PREDOMINANTLY MONONUCLEAR     NO ORGANISMS SEEN     CYTOSPIN Performed  at [facility]     Performed at [facility]   Culture       Value: NO GROWTH 1 DAY     Performed at [facility]     Status: None   Collection Time    05/29/13  2:20 PM      Result Value Ref Range   Specimen Description CSF     Special Requests NONE     Gram Stain       Value: WBC PRESENT, PREDOMINANTLY MONONUCLEAR     NO ORGANISMS SEEN     CYTOSPIN SLIDE   Report Status 05/29/2013 FINAL    CSF CELL COUNT WITH DIFFERENTIAL     Status: Abnormal   Collection Time    05/29/13  2:20 PM      Result Value Ref Range   Tube # 1     Color, CSF COLORLESS  COLORLESS   Appearance, CSF CLEAR  CLEAR   Supernatant NOT INDICATED     RBC Count, CSF 12 (*) 0 /cu mm   WBC, CSF 18 (*) 0 - 5 /cu mm   Comment: CRITICAL RESULT CALLED TO, READ BACK BY AND VERIFIED WITH:     B FOUST,RN 1615 05/29/13 D BRADLEY   Segmented Neutrophils-CSF 0  0 - 6 %   Lymphs, CSF 87 (*) 40 - 80 %   Monocyte-Macrophage-Spinal Fluid 13 (*) 15 - 45 %   Eosinophils, CSF 0  0 - 1 %  GLUCOSE, CAPILLARY     Status: Abnormal   Collection Time    05/29/13  5:52 PM      Result Value Ref Range   Glucose-Capillary 146 (*) 70 - 99 mg/dL  GLUCOSE,  CAPILLARY     Status: Abnormal   Collection Time    05/29/13  9:31 PM      Result Value Ref Range   Glucose-Capillary 177 (*) 70 - 99 mg/dL  GLUCOSE, CAPILLARY     Status: None   Collection Time    05/30/13  7:30 AM      Result Value Ref Range   Glucose-Capillary 97  70 - 99 mg/dL  GLUCOSE, CAPILLARY     Status: Abnormal   Collection Time    05/30/13 11:41 AM      Result Value Ref Range   Glucose-Capillary 100 (*) 70 - 99 mg/dL      Component Value Date/Time   SDES CSF 05/29/2013 1420   SDES CSF 05/29/2013 1420   SPECREQUEST NONE 05/29/2013 1420   SPECREQUEST NONE 05/29/2013 1420   CULT  Value: NO GROWTH 1 DAY Performed at [facility] 05/29/2013 1420   REPTSTATUS PENDING 05/29/2013 1420   REPTSTATUS 05/29/2013 FINAL 05/29/2013 1420   Dg Fluoro Guide Lumbar Puncture  05/29/2013   CLINICAL DATA:  Mental status change with fevers.  EXAM: DIAGNOSTIC LUMBAR PUNCTURE UNDER FLUOROSCOPIC GUIDANCE  FLUOROSCOPY TIME:  1 min 0 seconds.  PROCEDURE: Informed consent was obtained from the patient prior to the procedure, including potential complications of headache, allergy, and pain. With the patient prone, the lower back was prepped with Betadine. 1% Lidocaine was used for local anesthesia. Lumbar puncture was performed at the L3-4 level using a 20 in gauge needle with return of clear CSF with an opening pressure of less than 9 cm water. 40m of CSF were obtained for laboratory studies. The patient tolerated the procedure well and there were no  apparent complications.  IMPRESSION: Successful lumbar puncture under fluoroscopy.   Electronically Signed   By: Lorin Picket M.D.   On: 05/29/2013 14:39   Recent Results (from the past 240 hour(s))  CULTURE, BLOOD (ROUTINE X 2)     Status: None   Collection Time    05/23/13  4:25 PM      Result Value Ref Range Status   Specimen Description BLOOD RIGHT HAND   Final   Special Requests BOTTLES DRAWN AEROBIC ONLY 10CC   Final   Culture  Setup Time     Final     Value: 05/23/2013 22:25     Performed at [facility]   Culture     Final   Value: NO GROWTH 5 DAYS     Performed at [facility]   Report Status 05/29/2013 FINAL   Final  CLOSTRIDIUM DIFFICILE BY PCR     Status: None   Collection Time    05/26/13  4:39 AM      Result Value Ref Range Status   C difficile by pcr NEGATIVE  NEGATIVE Final  CSF CULTURE     Status: None   Collection Time    05/29/13  2:20 PM      Result Value Ref Range Status   Specimen Description CSF   Final   Special Requests NONE   Final   Gram Stain     Final   Value: WBC PRESENT, PREDOMINANTLY MONONUCLEAR     NO ORGANISMS SEEN     CYTOSPIN Performed at [facility]     Performed at [facility]   Culture     Final   Value: NO GROWTH 1 DAY     Performed at [facility]   Report Status PENDING   Incomplete  GRAM STAIN     Status: None   Collection Time    05/29/13  2:20 PM      Result Value Ref Range Status   Specimen Description CSF   Final   Special Requests NONE   Final   Gram Stain     Final   Value: WBC PRESENT, PREDOMINANTLY MONONUCLEAR     NO ORGANISMS SEEN     CYTOSPIN SLIDE   Report Status 05/29/2013 FINAL   Final      05/30/2013, 3:47 PM     LOS: 11 days     **Disclaimer: This note may have been dictated with voice recognition software. Similar sounding words can inadvertently be transcribed and this note may contain transcription errors which may not have been corrected upon publication of note.**

## 2013-05-30 NOTE — Progress Notes (Signed)
ANTIBIOTIC CONSULT NOTE - INITIAL  Pharmacy Consult:  Vancomycin Indication:  Meningitis  Allergies  Allergen Reactions  . Tetanus Toxoids Swelling    Also allergic to "high powered pain medications."  . Oxycodone     Makes him loopy    Patient Measurements: Height: 5\' 8"  (172.7 cm) Weight: 219 lb 9.6 oz (99.61 kg) IBW/kg (Calculated) : 68.4  Vital Signs: Temp: 98.6 F (37 C) (05/05 1320) Temp src: Axillary (05/05 1320) BP: 133/66 mmHg (05/05 1320) Pulse Rate: 107 (05/05 1320) Intake/Output from previous day: 05/04 0701 - 05/05 0700 In: -  Out: 1200 [Urine:1200] Intake/Output from this shift: Total I/O In: -  Out: 600 [Urine:600]  Labs:  Recent Labs  05/29/13 0527  WBC 10.5  HGB 10.5*  PLT 161  CREATININE 0.99   Estimated Creatinine Clearance: 69.2 ml/min (by C-G formula based on Cr of 0.99). No results found for this basename: VANCOTROUGH, Corlis Leak, VANCORANDOM, [place], GENTPEAK, GENTRANDOM, TOBRATROUGH, TOBRAPEAK, TOBRARND, AMIKACINPEAK, AMIKACINTROU, AMIKACIN,  in the last 72 hours   Microbiology: Recent Results (from the past 720 hour(s))  MRSA PCR SCREENING     Status: Abnormal   Collection Time    05/07/13  5:00 AM      Result Value Ref Range Status   MRSA by PCR POSITIVE (*) NEGATIVE Final   Comment:            The GeneXpert MRSA Assay (FDA     approved for NASAL specimens     only), is one component of a     comprehensive MRSA colonization     surveillance program. It is not     intended to diagnose MRSA     infection nor to guide or     monitor treatment for     MRSA infections.     RESULT CALLED TO, READ BACK BY AND VERIFIED WITH:     SURLES,M AT 1017 ON 05/07/13 BY GODFREY,O  MRSA PCR SCREENING     Status: None   Collection Time    05/19/13  7:37 PM      Result Value Ref Range Status   MRSA by PCR NEGATIVE  NEGATIVE Final   Comment:            The GeneXpert MRSA Assay (FDA     approved for NASAL specimens     only), is one  component of a     comprehensive MRSA colonization     surveillance program. It is not     intended to diagnose MRSA     infection nor to guide or     monitor treatment for     MRSA infections.  CULTURE, BLOOD (ROUTINE X 2)     Status: None   Collection Time    05/23/13  4:25 PM      Result Value Ref Range Status   Specimen Description BLOOD RIGHT HAND   Final   Special Requests BOTTLES DRAWN AEROBIC ONLY 10CC   Final   Culture  Setup Time     Final   Value: 05/23/2013 22:25     Performed at [facility]   Culture     Final   Value: NO GROWTH 5 DAYS     Performed at [facility]   Report Status 05/29/2013 FINAL   Final  CLOSTRIDIUM DIFFICILE BY PCR     Status: None   Collection Time    05/26/13  4:39 AM      Result Value Ref Range  Status   C difficile by pcr NEGATIVE  NEGATIVE Final  CSF CULTURE     Status: None   Collection Time    05/29/13  2:20 PM      Result Value Ref Range Status   Specimen Description CSF   Final   Special Requests NONE   Final   Gram Stain     Final   Value: WBC PRESENT, PREDOMINANTLY MONONUCLEAR     NO ORGANISMS SEEN     CYTOSPIN Performed at [facility]     Performed at [facility]   Culture     Final   Value: NO GROWTH 1 DAY     Performed at [facility]   Report Status PENDING   Incomplete  GRAM STAIN     Status: None   Collection Time    05/29/13  2:20 PM      Result Value Ref Range Status   Specimen Description CSF   Final   Special Requests NONE   Final   Gram Stain     Final   Value: WBC PRESENT, PREDOMINANTLY MONONUCLEAR     NO ORGANISMS SEEN     CYTOSPIN SLIDE   Report Status 05/29/2013 FINAL   Final    Medical History: Past Medical History  Diagnosis Date  . Hypertension   . Gout     Acute episode involving ankle in 12/2011  . Hyperlipidemia   . Deep vein thrombophlebitis of left leg 11/2010    LLE; S/P THA  . Fasting hyperglycemia   . Stroke 08/2009; 07/2010    TIA in 07/2010  .  H/O heat stroke 08/2009  . Prostate cancer 2013    adenocarcinoma; Gleason grade 8; PSA 9.45; EBRT 2013-14  . Renal cyst   . Syncope     01/2012: Negative pharmacologic stress nuclear  . PAF (paroxysmal atrial fibrillation)     a. first noted 01/2012 during admission for URI;  b. seen again 03/2012 after nstemi of unknown origin - pradaxa initiated.  Marland Kitchen CAD (coronary artery disease)     a. 01/2012 Elev Ti-->nonischemic MV;  b. 03/2012 NSTEMI/Cath: LM 50, LAD 50p/m, 51m/d, LCX nl, RI 60ost sup branch, RCA diff nonobs, EF 55%  . Radiation proctitis 02/2013  . NSTEMI (non-ST elevated myocardial infarction) 02/2013  . Diabetes mellitus without complication      Assessment: 74 YOM with AMS and fever to start broad spectrum antibiotics for rule out meningitis.  Noted he was started on antiviral yesterday.  Baseline labs reviewed.  Vanc 5/5 >> CTX 5/5 >> Amp 5/5 >> Acyclovir 5/4 >>  5/4 CSF cx - pending 4/28 BCx x2 - negative 5/1 C.diff PCR - negative   Goal of Therapy:  Vancomycin trough level 15-20 mcg/ml   Plan:  - Vanc 2gm IV x 1, then 1gm IV Q12H - Continue ampicillin, ceftriaxone, and acyclovir as ordered - Monitor renal fxn, micro data, clinical course, vanc trough at [facility]    Danton Palmateer D. Mina Marble, PharmD, BCPS Pager:  [phone] 05/30/2013, 4:37 PM

## 2013-05-30 NOTE — Progress Notes (Signed)
Patient evaluated for community based chronic disease management services with [facility] as a benefit of patient's [employer]. Spoke with patient's HCPOA at bedside to explain [facility] services.  Family would like to discuss services prior acceptance.  Left contact information and [facility] literature at bedside. Made Inpatient Case Manager aware that [facility] following. Of note, [facility] services does not replace or interfere with any services that are arranged by inpatient case management or social work.  For additional questions or referrals please contact Corliss Blacker BSN RN [facility] Liaison at [phone].

## 2013-05-30 NOTE — Progress Notes (Addendum)
NEURO HOSPITALIST PROGRESS NOTE   SUBJECTIVE:                                                                                                                         Patient is very drowsy, unable to hold a conversation.  Wife at bedside states he started to be non coherent last night. Recent Temp 99.7 OBJECTIVE:                                                                                                                           Vital signs in last 24 hours: Temp:  [98.3 F (36.8 C)-99.7 F (37.6 C)] 99.7 F (37.6 C) (05/05 0500) Pulse Rate:  [56-98] 98 (05/05 1035) Resp:  [18-20] 20 (05/05 0500) BP: (104-110)/(41-69) 106/59 mmHg (05/05 1035) SpO2:  [92 %-96 %] 92 % (05/05 0500) Weight:  [99.61 kg (219 lb 9.6 oz)] 99.61 kg (219 lb 9.6 oz) (05/05 0500)  Intake/Output from previous day: 05/04 0701 - 05/05 0700 In: -  Out: 1200 [Urine:1200] Intake/Output this shift:   Nutritional status: Cardiac  Past Medical History  Diagnosis Date  . Hypertension   . Gout     Acute episode involving ankle in 12/2011  . Hyperlipidemia   . Deep vein thrombophlebitis of left leg 11/2010    LLE; S/P THA  . Fasting hyperglycemia   . Stroke 08/2009; 07/2010    TIA in 07/2010  . H/O heat stroke 08/2009  . Prostate cancer 2013    adenocarcinoma; Gleason grade 8; PSA 9.45; EBRT 2013-14  . Renal cyst   . Syncope     01/2012: Negative pharmacologic stress nuclear  . PAF (paroxysmal atrial fibrillation)     a. first noted 01/2012 during admission for URI;  b. seen again 03/2012 after nstemi of unknown origin - pradaxa initiated.  Marland Kitchen CAD (coronary artery disease)     a. 01/2012 Elev Ti-->nonischemic MV;  b. 03/2012 NSTEMI/Cath: LM 50, LAD 50p/m, 39m/d, LCX nl, RI 60ost sup branch, RCA diff nonobs, EF 55%  . Radiation proctitis 02/2013  . NSTEMI (non-ST elevated myocardial infarction) 02/2013  . Diabetes mellitus without complication     Neurologic Exam:  Mental  Status: Alert, not oriented to place, year  or month.  Speech dysarthric and content is tangental.  Unable to follow simple commands.  Cranial Nerves: II: Visual fields grossly normal, pupils equal, round, reactive to light and accommodation III,IV, VI: ptosis not present, extra-ocular motions intact bilaterally V,VII: smile symmetric, facial light touch sensation normal bilaterally VIII: hearing normal bilaterally IX,X: gag reflex present XI: bilateral shoulder shrug XII: midline tongue extension without atrophy or fasciculations  Motor: Moving all extremities antigravity Sensory: Pinprick and light touch intact throughout, bilaterally Deep Tendon Reflexes:  Right: Upper Extremity   Left: Upper extremity   biceps (C-5 to C-6) 2/4   biceps (C-5 to C-6) 2/4 tricep (C7) 2/4    triceps (C7) 2/4 Brachioradialis (C6) 2/4  Brachioradialis (C6) 2/4  Lower Extremity Lower Extremity  quadriceps (L-2 to L-4) 1/4   quadriceps (L-2 to L-4) 1/4 Achilles (S1) 0/4   Achilles (S1) 0/4  Plantars: Mute bilaterally    Lab Results: Basic Metabolic Panel:  Recent Labs Lab 05/26/13 1025 05/29/13 0527  NA 138 136*  K 3.5* 3.8  CL 100 97  CO2 25 27  GLUCOSE 95 156*  BUN 14 21  CREATININE 0.90 0.99  CALCIUM 9.1 9.3    Liver Function Tests:  Recent Labs Lab 05/29/13 0527  AST 12  ALT 14  ALKPHOS 57  BILITOT 0.3  PROT 5.4*  ALBUMIN 2.2*   No results found for this basename: LIPASE, AMYLASE,  in the last 168 hours No results found for this basename: AMMONIA,  in the last 168 hours  CBC:  Recent Labs Lab 05/26/13 1025 05/29/13 0527  WBC 6.7 10.5  NEUTROABS 4.8  --   HGB 11.5* 10.5*  HCT 33.7* 31.3*  MCV 102.7* 100.0  PLT 123* 161    Cardiac Enzymes: No results found for this basename: CKTOTAL, CKMB, CKMBINDEX, TROPONINI,  in the last 168 hours  Lipid Panel: No results found for this basename: CHOL, TRIG, HDL, CHOLHDL, VLDL, LDLCALC,  in the last 168  hours  CBG:  Recent Labs Lab 05/29/13 0807 05/29/13 1229 05/29/13 1752 05/29/13 2131 05/30/13 0730  GLUCAP 139* 204* 146* 177* 97    Microbiology: Results for orders placed during the hospital encounter of 05/19/13  MRSA PCR SCREENING     Status: None   Collection Time    05/19/13  7:37 PM      Result Value Ref Range Status   MRSA by PCR NEGATIVE  NEGATIVE Final   Comment:            The GeneXpert MRSA Assay (FDA     approved for NASAL specimens     only), is one component of a     comprehensive MRSA colonization     surveillance program. It is not     intended to diagnose MRSA     infection nor to guide or     monitor treatment for     MRSA infections.  CULTURE, BLOOD (ROUTINE X 2)     Status: None   Collection Time    05/23/13  4:25 PM      Result Value Ref Range Status   Specimen Description BLOOD RIGHT HAND   Final   Special Requests BOTTLES DRAWN AEROBIC ONLY 10CC   Final   Culture  Setup Time     Final   Value: 05/23/2013 22:25     Performed at [facility]   Culture     Final   Value: NO GROWTH 5 DAYS  Performed at [facility]   Report Status 05/29/2013 FINAL   Final  CLOSTRIDIUM DIFFICILE BY PCR     Status: None   Collection Time    05/26/13  4:39 AM      Result Value Ref Range Status   C difficile by pcr NEGATIVE  NEGATIVE Final  CSF CULTURE     Status: None   Collection Time    05/29/13  2:20 PM      Result Value Ref Range Status   Specimen Description CSF   Final   Special Requests NONE   Final   Gram Stain     Final   Value: WBC PRESENT, PREDOMINANTLY MONONUCLEAR     NO ORGANISMS SEEN     CYTOSPIN Performed at [facility]     Performed at [facility]   Culture     Final   Value: NO GROWTH 1 DAY     Performed at [facility]   Report Status PENDING   Incomplete  GRAM STAIN     Status: None   Collection Time    05/29/13  2:20 PM      Result Value Ref Range Status   Specimen Description CSF    Final   Special Requests NONE   Final   Gram Stain     Final   Value: WBC PRESENT, PREDOMINANTLY MONONUCLEAR     NO ORGANISMS SEEN     CYTOSPIN SLIDE   Report Status 05/29/2013 FINAL   Final    Coagulation Studies: No results found for this basename: LABPROT, INR,  in the last 72 hours  Imaging: Dg Fluoro Guide Lumbar Puncture  05/29/2013   CLINICAL DATA:  Mental status change with fevers.  EXAM: DIAGNOSTIC LUMBAR PUNCTURE UNDER FLUOROSCOPIC GUIDANCE  FLUOROSCOPY TIME:  1 min 0 seconds.  PROCEDURE: Informed consent was obtained from the patient prior to the procedure, including potential complications of headache, allergy, and pain. With the patient prone, the lower back was prepped with Betadine. 1% Lidocaine was used for local anesthesia. Lumbar puncture was performed at the L3-4 level using a 20 in gauge needle with return of clear CSF with an opening pressure of less than 9 cm water. 21ml of CSF were obtained for laboratory studies. The patient tolerated the procedure well and there were no apparent complications.  IMPRESSION: Successful lumbar puncture under fluoroscopy.   Electronically Signed   By: Lorin Picket M.D.   On: 05/29/2013 14:39       MEDICATIONS                                                                                                                        Scheduled: . acyclovir  700 mg Intravenous Q8H  . allopurinol  300 mg Oral q morning - 10a  . amitriptyline  12.5 mg Oral QHS  . atorvastatin  10 mg Oral q1800  . calcium-vitamin D  1 tablet Oral QPM  . diltiazem  180 mg Oral q morning - 10a  . docusate sodium  100 mg Oral BID  . feeding supplement (GLUCERNA SHAKE)  237 mL Oral BID PC  . furosemide  40 mg Intravenous Daily  . glipiZIDE  2.5 mg Oral Q breakfast  . insulin aspart  0-15 Units Subcutaneous TID WC  . lactulose  20 g Oral BID  . lidocaine  1 patch Transdermal Q24H  . metoprolol  25 mg Oral BID  . multivitamin with minerals  1 tablet Oral QPM   . polyethylene glycol  17 g Oral BID  . rivaroxaban  20 mg Oral Q supper  . sodium chloride  3 mL Intravenous Q12H   EEG Interpretation: EEG shows minimal generalized nonspecific slowing of cerebral activity. No evidence of an epileptic disorder was seen.  LP: Glucose 96 protien 51 RBC 12 WBC 18 Segs 0 Lymph 87 Monocytes 13 Eosinophils 0  Gram stain (-) Culture (pending)   ASSESSMENT/PLAN:                                                                                                            80 YO male with history of HSV zoster, now completed course of antivirals. On admission was febrile with no source of fever found and AMS. Currently he remains with low grade fever 99.7 and drowsy.  He is less alert than yesterday and not able to follow simple commands.  LP results showed WBC 18 with lymph of 87.    Recommend: 1) would consult ID for further evaluation of CSF elevated WBC and low grade fever.   Will continue to follow    Assessment and plan discussed with with attending physician and they are in agreement.    Etta Quill PA-C [facility] [phone]  05/30/2013, 10:54 AM

## 2013-05-31 ENCOUNTER — Inpatient Hospital Stay (HOSPITAL_COMMUNITY): Payer: Medicare Other

## 2013-05-31 DIAGNOSIS — F05 Delirium due to known physiological condition: Secondary | ICD-10-CM

## 2013-05-31 LAB — GLUCOSE, CAPILLARY
GLUCOSE-CAPILLARY: 114 mg/dL — AB (ref 70–99)
GLUCOSE-CAPILLARY: 152 mg/dL — AB (ref 70–99)
GLUCOSE-CAPILLARY: 99 mg/dL (ref 70–99)
Glucose-Capillary: 110 mg/dL — ABNORMAL HIGH (ref 70–99)

## 2013-05-31 LAB — URINE CULTURE

## 2013-05-31 LAB — HERPES SIMPLEX VIRUS(HSV) DNA BY PCR
HSV 1 DNA: NOT DETECTED
HSV 2 DNA: NOT DETECTED

## 2013-05-31 LAB — PATHOLOGIST SMEAR REVIEW

## 2013-05-31 MED ORDER — SODIUM CHLORIDE 0.9 % IV SOLN
2.0000 g | INTRAVENOUS | Status: DC
  Administered 2013-05-31 – 2013-06-02 (×12): 2 g via INTRAVENOUS
  Filled 2013-05-31 (×18): qty 2000

## 2013-05-31 MED ORDER — SODIUM CHLORIDE 0.45 % IV SOLN
INTRAVENOUS | Status: AC
  Administered 2013-05-31: 17:00:00 via INTRAVENOUS

## 2013-05-31 NOTE — Progress Notes (Signed)
Subjective: Patient did well on Monday but has been less alert since that time.  Complains of pain throughout.  Continues to have low grade temps.  Being followed by ID as well.  Patient on Rocephin, Vancomycin and Acyclovir. Tmax 101.2  Objective: Current vital signs: BP 113/92  Pulse 100  Temp(Src) 101.2 F (38.4 C) (Axillary)  Resp 20  Ht 5\' 8"  (1.727 m)  Wt 99 kg (218 lb 4.1 oz)  BMI 33.19 kg/m2  SpO2 94% Vital signs in last 24 hours: Temp:  [98.6 F (37 C)-101.2 F (38.4 C)] 101.2 F (38.4 C) (05/06 0515) Pulse Rate:  [97-107] 100 (05/06 1019) Resp:  [18-20] 20 (05/06 0515) BP: (85-133)/(48-92) 113/92 mmHg (05/06 0515) SpO2:  [94 %-97 %] 94 % (05/06 0515) Weight:  [99 kg (218 lb 4.1 oz)-101.923 kg (224 lb 11.2 oz)] 99 kg (218 lb 4.1 oz) (05/06 0515)  Intake/Output from previous day: 05/05 0701 - 05/06 0700 In: 492 [IV Piggyback:492] Out: 1050 [Urine:1050] Intake/Output this shift:   Nutritional status: Cardiac  Neurologic Exam: Mental Status: Laying in bed with eyes closed.  Will open eyes to command.  Able to focus on objects.  Responds appropriately to questions asked with brief direct answers.    Cranial Nerves: II: Visual fields (when eyelids held open he will blink to threat bilaterally), pupils equal, round, reactive to light and accommodation III,IV, VI: holds eyelids closed, doll's intact V,VII: face symmetric, moans when either side of face is touched VIII: intact IX,X: gag reflex present XI: bilateral shoulder shrug XII: midline tongue extension   Motor: Withdraws bilaterally both UE and LE to pain but does not move extremities antigravity.  Sensory: moans in discomfort with light touch in all extremites Deep Tendon Reflexes:  Right: Upper Extremity   Left: Upper extremity   biceps (C-5 to C-6) 2/4   biceps (C-5 to C-6) 2/4 tricep (C7) 2/4    triceps (C7) 2/4 Brachioradialis (C6) 2/4  Brachioradialis (C6) 2/4  Lower Extremity Lower Extremity   quadriceps (L-2 to L-4) 2/4   quadriceps (L-2 to L-4) 2/4 Achilles (S1) 0/4   Achilles (S1) 0/4  Plantars: Right: downgoing   Left: downgoing    Lab Results: Basic Metabolic Panel:  Recent Labs Lab 05/26/13 1025 05/29/13 0527  NA 138 136*  K 3.5* 3.8  CL 100 97  CO2 25 27  GLUCOSE 95 156*  BUN 14 21  CREATININE 0.90 0.99  CALCIUM 9.1 9.3    Liver Function Tests:  Recent Labs Lab 05/29/13 0527  AST 12  ALT 14  ALKPHOS 57  BILITOT 0.3  PROT 5.4*  ALBUMIN 2.2*    Recent Labs Lab 05/30/13 1724  LIPASE 11  AMYLASE 20   No results found for this basename: AMMONIA,  in the last 168 hours  CBC:  Recent Labs Lab 05/26/13 1025 05/29/13 0527  WBC 6.7 10.5  NEUTROABS 4.8  --   HGB 11.5* 10.5*  HCT 33.7* 31.3*  MCV 102.7* 100.0  PLT 123* 161    Cardiac Enzymes: No results found for this basename: CKTOTAL, CKMB, CKMBINDEX, TROPONINI,  in the last 168 hours  Lipid Panel: No results found for this basename: CHOL, TRIG, HDL, CHOLHDL, VLDL, LDLCALC,  in the last 168 hours  CBG:  Recent Labs Lab 05/30/13 0730 05/30/13 1141 05/30/13 1802 05/30/13 2222 05/31/13 0829  GLUCAP 97 100* 83 115* 114*    Microbiology: Results for orders placed during the hospital encounter of 05/19/13  MRSA PCR SCREENING  Status: None   Collection Time    05/19/13  7:37 PM      Result Value Ref Range Status   MRSA by PCR NEGATIVE  NEGATIVE Final   Comment:            The GeneXpert MRSA Assay (FDA     approved for NASAL specimens     only), is one component of a     comprehensive MRSA colonization     surveillance program. It is not     intended to diagnose MRSA     infection nor to guide or     monitor treatment for     MRSA infections.  CULTURE, BLOOD (ROUTINE X 2)     Status: None   Collection Time    05/23/13  4:25 PM      Result Value Ref Range Status   Specimen Description BLOOD RIGHT HAND   Final   Special Requests BOTTLES DRAWN AEROBIC ONLY 10CC    Final   Culture  Setup Time     Final   Value: 05/23/2013 22:25     Performed at [facility]   Culture     Final   Value: NO GROWTH 5 DAYS     Performed at [facility]   Report Status 05/29/2013 FINAL   Final  CLOSTRIDIUM DIFFICILE BY PCR     Status: None   Collection Time    05/26/13  4:39 AM      Result Value Ref Range Status   C difficile by pcr NEGATIVE  NEGATIVE Final  CSF CULTURE     Status: None   Collection Time    05/29/13  2:20 PM      Result Value Ref Range Status   Specimen Description CSF   Final   Special Requests NONE   Final   Gram Stain     Final   Value: WBC PRESENT, PREDOMINANTLY MONONUCLEAR     NO ORGANISMS SEEN     CYTOSPIN Performed at [facility]     Performed at [facility]   Culture     Final   Value: NO GROWTH 2 DAYS     Performed at [facility]   Report Status PENDING   Incomplete  GRAM STAIN     Status: None   Collection Time    05/29/13  2:20 PM      Result Value Ref Range Status   Specimen Description CSF   Final   Special Requests NONE   Final   Gram Stain     Final   Value: WBC PRESENT, PREDOMINANTLY MONONUCLEAR     NO ORGANISMS SEEN     CYTOSPIN SLIDE   Report Status 05/29/2013 FINAL   Final    Coagulation Studies: No results found for this basename: LABPROT, INR,  in the last 72 hours  Imaging: Dg Chest Port 1 View  05/30/2013   CLINICAL DATA:  Fever  EXAM: PORTABLE CHEST - 1 VIEW  COMPARISON:  Prior radiograph from 05/27/2013  FINDINGS: Cardiomegaly again noted.  Lungs are mildly hypoinflated with secondary bronchovascular crowding. No focal infiltrate or pulmonary edema. No pleural effusion. There is no pneumothorax.  Osseous structures are unchanged.  IMPRESSION: 1. No acute cardiopulmonary abnormality. 2. Stable cardiomegaly.   Electronically Signed   By: Jeannine Boga M.D.   On: 05/30/2013 19:48   Dg Fluoro Guide Lumbar Puncture  05/29/2013   CLINICAL DATA:  Mental status change  with  fevers.  EXAM: DIAGNOSTIC LUMBAR PUNCTURE UNDER FLUOROSCOPIC GUIDANCE  FLUOROSCOPY TIME:  1 min 0 seconds.  PROCEDURE: Informed consent was obtained from the patient prior to the procedure, including potential complications of headache, allergy, and pain. With the patient prone, the lower back was prepped with Betadine. 1% Lidocaine was used for local anesthesia. Lumbar puncture was performed at the L3-4 level using a 20 in gauge needle with return of clear CSF with an opening pressure of less than 9 cm water. 22ml of CSF were obtained for laboratory studies. The patient tolerated the procedure well and there were no apparent complications.  IMPRESSION: Successful lumbar puncture under fluoroscopy.   Electronically Signed   By: Lorin Picket M.D.   On: 05/29/2013 14:39    Medications:  Scheduled: . acyclovir  700 mg Intravenous Q8H  . allopurinol  300 mg Oral q morning - 10a  . amitriptyline  12.5 mg Oral QHS  . ampicillin (OMNIPEN) IV  2 g Intravenous 6 times per day  . atorvastatin  10 mg Oral q1800  . calcium-vitamin D  1 tablet Oral QPM  . cefTRIAXone (ROCEPHIN)  IV  2 g Intravenous Q12H  . diltiazem  180 mg Oral q morning - 10a  . docusate sodium  100 mg Oral BID  . feeding supplement (GLUCERNA SHAKE)  237 mL Oral BID PC  . furosemide  40 mg Intravenous Daily  . glipiZIDE  2.5 mg Oral Q breakfast  . insulin aspart  0-15 Units Subcutaneous TID WC  . lidocaine  1 patch Transdermal Q24H  . metoprolol  25 mg Oral BID  . multivitamin with minerals  1 tablet Oral QPM  . polyethylene glycol  17 g Oral BID  . rivaroxaban  20 mg Oral Q supper  . sodium chloride  3 mL Intravenous Q12H  . vancomycin  1,000 mg Intravenous Q12H    Assessment/Plan: Patient with fever and altered mental status.  Mental status likely related to fever but etiology unclear.  Patient on broad spectrum antibiotics including antivirals.  ID following.    Recommendations: 1.  Will continue to follow with you.   Repeat MRI pending.      LOS: 12 days   Alexis Goodell, MD [facility] [phone]  05/31/2013  11:54 AM

## 2013-05-31 NOTE — Progress Notes (Signed)
PROGRESS NOTE  [name] KGM:[id number] DOB: 1931/03/08 DOA: 05/19/2013 PCP: Leonides Grills, MD   [name] [name] is a 80 y.o. male  With a history of hypertension, hyperlipidemia, left leg DVT, coronary artery disease, history of rectal bleeding, prostate cancer status post radiation, presents emergency department for bright red blood per rectum. Patient states this has been ongoing for approximately 4 days. He had a similar episode in February of 2015 at which point he underwent a sigmoidoscopy which showed bleeding from a dilated capillary, which was due to self disimpaction. Patient was recently admitted on 05/07/2013 weakness and pain control shingles. He is currently taking Valtrex and prednisone. Patient is currently in pain. He only complains of shaking and weakness.  GI was consulted initially. Initially felt for no intervention if hgb remained stable. Pt later became febrile with tmax of 102 with ms changes. Pan cultures were unremarkable. Neurology was consulted who recommended LP. LP was notable for a wbc of 18. Empiric acyclovir was started. ID was consulted with recs for added vanc, rocephin, and amp.   Assessment/Plan:   Fever with decreased mental status -U/A clear -Chest x ray unremarkable -blood culture x 2 - prelim neg thus far -Source still unclear -Tmax 102.1 on 4/29 -Borderline CSF ID consulted and patient currently on empiric Vanco-ampicillin-Rocephin along with acyclovir , will monitor CSF cultures. -Neuro following for decreased mental status which could be encephalopathy, MRI x1 negative, repeat MRI ordered.   Baseline at home appears to be sitting in chair and pivoting to toilet ?AMS here- prob multifactorial vs polypharmacy? -Neurology consulted -Recommended MRI - unremarkable, repeat ordered -CSF as above      Painless hematochezia.   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 S/p sigmoidoscopy show no other source of bleeding about 6 weeks ago.    -started Miralax 2 to 3 times a day - changed to PRN as pt started having loose stools -Hgb 10.5 currently (was 13 on 4/24). Hemodynamically stable. Cont to follow closely -Resumed on home dose Xaralto closely monitor hemoglobin    Shingles  -treated but still painful -trying to minimize narcotics as patient it makes patient lethargic -added lidocaine patch -added amytrptyline -neurontin held     Hypertension  -Continue metoprolol and Lasix     Diabetes mellitus with neuropathy  -Continue glipizide  -Continue gabapentin for neuropathy - decreased dose per below -Will place patient on insulin sliding scale with CBG monitoring  -Last hemoglobin A1c November 2014 6.6  CBG (last 3)   Recent Labs  05/30/13 2222 05/31/13 0829 05/31/13 1222  GLUCAP 115* 114* 110*      Chronic Anticoagulation - pt with hx of CVA in setting of afib - Pt with documented hx of LLE DVT s/p hip surgery - Pt is now s/p LP - discussed with fluro - Resumed xarelto - observe closely for bleed     Code Status: DNR Family Communication: family at bedside updated personally on 05/31/2013  Disposition Plan: PT eval- recs SNF but family has 24 hour care givers and want to take home     Consultants:  GI  Neurology  ID  Procedures:  MRI brain 4/28  Repeat MRI ordered 05/31/2013    LP     HPI/Subjective:  Today, pt less alert. lying in bed in no discomfort.     Objective:  Intake/Output Summary (Last 24 hours) at 05/31/13 1411 Last data filed at 05/31/13 0515  Gross per 24 hour  Intake    492 ml  Output  450 ml  Net     42 ml   Filed Weights   05/30/13 0500 05/31/13 0500 05/31/13 0515  Weight: 99.61 kg (219 lb 9.6 oz) 101.923 kg (224 lb 11.2 oz) 99 kg (218 lb 4.1 oz)    Filed Vitals:   05/31/13 0033 05/31/13 0500 05/31/13 0515 05/31/13 1019  BP: 116/55  113/92   Pulse: 97  102 100  Temp: 98.6 F (37 C)  101.2 F (38.4 C)   TempSrc:   Axillary   Resp:    20   Height:      Weight:  101.923 kg (224 lb 11.2 oz) 99 kg (218 lb 4.1 oz)   SpO2: 97%  94%    Exam:  General: Alert and drowsy in the bed, in nad Cardiovascular: S1 S2 auscultated, no rubs, murmurs or gallops. Regular rate and rhythm.  Respiratory: Clear to auscultation bilaterally with equal chest rise  Abdomen: Soft, obese, nontender, nondistended, + bowel sounds  Neuro: moves all 4 ext- eyes closed Skin: scarring on right thigh  Data Reviewed: Basic Metabolic Panel:  Recent Labs Lab 05/26/13 1025 05/29/13 0527  NA 138 136*  K 3.5* 3.8  CL 100 97  CO2 25 27  GLUCOSE 95 156*  BUN 14 21  CREATININE 0.90 0.99  CALCIUM 9.1 9.3   Liver Function Tests:  Recent Labs Lab 05/29/13 0527  AST 12  ALT 14  ALKPHOS 57  BILITOT 0.3  PROT 5.4*  ALBUMIN 2.2*    Recent Labs Lab 05/30/13 1724  LIPASE 11  AMYLASE 20   No results found for this basename: AMMONIA,  in the last 168 hours CBC:  Recent Labs Lab 05/26/13 1025 05/29/13 0527  WBC 6.7 10.5  NEUTROABS 4.8  --   HGB 11.5* 10.5*  HCT 33.7* 31.3*  MCV 102.7* 100.0  PLT 123* 161   Cardiac Enzymes: No results found for this basename: CKTOTAL, CKMB, CKMBINDEX, TROPONINI,  in the last 168 hours BNP (last 3 results) No results found for this basename: PROBNP,  in the last 8760 hours CBG:  Recent Labs Lab 05/30/13 1141 05/30/13 1802 05/30/13 2222 05/31/13 0829 05/31/13 1222  GLUCAP 100* 83 115* 114* 110*    Recent Results (from the past 240 hour(s))  CULTURE, BLOOD (ROUTINE X 2)     Status: None   Collection Time    05/23/13  4:25 PM      Result Value Ref Range Status   Specimen Description BLOOD RIGHT HAND   Final   Special Requests BOTTLES DRAWN AEROBIC ONLY 10CC   Final   Culture  Setup Time     Final   Value: 05/23/2013 22:25     Performed at [facility]   Culture     Final   Value: NO GROWTH 5 DAYS     Performed at [facility]   Report Status 05/29/2013 FINAL   Final   CLOSTRIDIUM DIFFICILE BY PCR     Status: None   Collection Time    05/26/13  4:39 AM      Result Value Ref Range Status   C difficile by pcr NEGATIVE  NEGATIVE Final  CSF CULTURE     Status: None   Collection Time    05/29/13  2:20 PM      Result Value Ref Range Status   Specimen Description CSF   Final   Special Requests NONE   Final   Gram Stain     Final  Value: WBC PRESENT, PREDOMINANTLY MONONUCLEAR     NO ORGANISMS SEEN     CYTOSPIN Performed at [facility]     Performed at [facility]   Culture     Final   Value: NO GROWTH 2 DAYS     Performed at [facility]   Report Status PENDING   Incomplete  GRAM STAIN     Status: None   Collection Time    05/29/13  2:20 PM      Result Value Ref Range Status   Specimen Description CSF   Final   Special Requests NONE   Final   Gram Stain     Final   Value: WBC PRESENT, PREDOMINANTLY MONONUCLEAR     NO ORGANISMS SEEN     CYTOSPIN SLIDE   Report Status 05/29/2013 FINAL   Final     Studies: Dg Chest Port 1 View  05/30/2013   CLINICAL DATA:  Fever  EXAM: PORTABLE CHEST - 1 VIEW  COMPARISON:  Prior radiograph from 05/27/2013  FINDINGS: Cardiomegaly again noted.  Lungs are mildly hypoinflated with secondary bronchovascular crowding. No focal infiltrate or pulmonary edema. No pleural effusion. There is no pneumothorax.  Osseous structures are unchanged.  IMPRESSION: 1. No acute cardiopulmonary abnormality. 2. Stable cardiomegaly.   Electronically Signed   By: Jeannine Boga M.D.   On: 05/30/2013 19:48   Dg Fluoro Guide Lumbar Puncture  05/29/2013   CLINICAL DATA:  Mental status change with fevers.  EXAM: DIAGNOSTIC LUMBAR PUNCTURE UNDER FLUOROSCOPIC GUIDANCE  FLUOROSCOPY TIME:  1 min 0 seconds.  PROCEDURE: Informed consent was obtained from the patient prior to the procedure, including potential complications of headache, allergy, and pain. With the patient prone, the lower back was prepped with Betadine. 1%  Lidocaine was used for local anesthesia. Lumbar puncture was performed at the L3-4 level using a 20 in gauge needle with return of clear CSF with an opening pressure of less than 9 cm water. 70ml of CSF were obtained for laboratory studies. The patient tolerated the procedure well and there were no apparent complications.  IMPRESSION: Successful lumbar puncture under fluoroscopy.   Electronically Signed   By: Lorin Picket M.D.   On: 05/29/2013 14:39    Scheduled Meds:  Anti-infectives   Start     Dose/Rate Route Frequency Ordered Stop   05/31/13 1200  ampicillin (OMNIPEN) 2 g in sodium chloride 0.9 % 50 mL IVPB     2 g 150 mL/hr over 20 Minutes Intravenous 6 times per day 05/31/13 0826     05/31/13 0600  vancomycin (VANCOCIN) IVPB 1000 mg/200 mL premix     1,000 mg 200 mL/hr over 60 Minutes Intravenous Every 12 hours 05/30/13 1639     05/30/13 2200  cefTRIAXone (ROCEPHIN) 2 g in dextrose 5 % 50 mL IVPB     2 g 100 mL/hr over 30 Minutes Intravenous Every 12 hours 05/30/13 1621     05/30/13 1645  vancomycin (VANCOCIN) 2,000 mg in sodium chloride 0.9 % 500 mL IVPB     2,000 mg 250 mL/hr over 120 Minutes Intravenous STAT 05/30/13 1639 05/30/13 2003   05/30/13 1630  ampicillin (OMNIPEN) 1 g in sodium chloride 0.9 % 50 mL IVPB  Status:  Discontinued     1 g 150 mL/hr over 20 Minutes Intravenous 6 times per day 05/30/13 1621 05/31/13 0826   05/30/13 0600  acyclovir (ZOVIRAX) 700 mg in dextrose 5 % 100 mL IVPB  700 mg 114 mL/hr over 60 Minutes Intravenous Every 8 hours 05/30/13 0217     05/29/13 1800  acyclovir (ZOVIRAX) 965 mg in dextrose 5 % 150 mL IVPB  Status:  Discontinued     10 mg/kg  96.6 kg 169.3 mL/hr over 60 Minutes Intravenous Every 8 hours 05/29/13 1702 05/30/13 0217   05/19/13 2200  valACYclovir (VALTREX) tablet 500 mg  Status:  Discontinued     500 mg Oral 2 times daily 05/19/13 1726 05/20/13 1103     . acyclovir  700 mg Intravenous Q8H  . allopurinol  300 mg Oral q  morning - 10a  . amitriptyline  12.5 mg Oral QHS  . ampicillin (OMNIPEN) IV  2 g Intravenous 6 times per day  . atorvastatin  10 mg Oral q1800  . calcium-vitamin D  1 tablet Oral QPM  . cefTRIAXone (ROCEPHIN)  IV  2 g Intravenous Q12H  . diltiazem  180 mg Oral q morning - 10a  . docusate sodium  100 mg Oral BID  . feeding supplement (GLUCERNA SHAKE)  237 mL Oral BID PC  . furosemide  40 mg Intravenous Daily  . glipiZIDE  2.5 mg Oral Q breakfast  . insulin aspart  0-15 Units Subcutaneous TID WC  . lidocaine  1 patch Transdermal Q24H  . metoprolol  25 mg Oral BID  . multivitamin with minerals  1 tablet Oral QPM  . polyethylene glycol  17 g Oral BID  . rivaroxaban  20 mg Oral Q supper  . sodium chloride  3 mL Intravenous Q12H  . vancomycin  1,000 mg Intravenous Q12H    Time spent: 35 min   05/31/2013, 2:11 PM  LOS: 12 days   Thurnell Lose M.D on 05/31/2013 at 2:11 PM  Between 7am to 7pm - Pager - [phone], After 7pm go to [email or web address] - password [email or web address]  And look for the night coverage person covering me after hours  [facility]  Office  [phone]

## 2013-05-31 NOTE — Progress Notes (Signed)
NUTRITION FOLLOW UP  Intervention:   1.  Supplements; continue Glucerna Shake po TID, each supplement provides 220 kcal and 10 grams of protein. 2.  Continue MVI  Nutrition Dx:   Increased nutrient needs related to wound healing as evidenced by estimated energy needs and stage 2 and stage 1 pressure ulcers.   Goal:  Pt to meet >/= 90% of their estimated nutrition needs   Monitor:  PO intake, weight, labs, I/O's, plan of care  Assessment:   80 y.o. male with a history of hypertension, hyperlipidemia, left leg DVT, coronary artery disease, history of rectal bleeding, prostate cancer status post radiation, presents emergency department for bright red blood per rectum. Patient states this has been ongoing for approximately 4 days. Patient was recently admitted on 05/07/2013 weakness and pain control shingles. He is currently taking Valtrex and prednisone. Patient is currently in pain. He only complains of shaking and weakness.  Pt assessed by RD 5/1.  Pt intake at that time was variable.  Pt off unit at time of visit- to MRI.  RD will continue to follow for ongoing assessment and interventions as needed.    Height: Ht Readings from Last 1 Encounters:  05/20/13 5\' 8"  (1.727 m)    Weight Status:   Wt Readings from Last 1 Encounters:  05/31/13 218 lb 4.1 oz (99 kg)    Re-estimated needs:  Kcal: 1800-2000  Protein: 100-115 grams  Fluid: 1.8-2 L/day  Skin: generalized edema, Stage 1 and 2 pressure ulcers  Diet Order: Cardiac   Intake/Output Summary (Last 24 hours) at 05/31/13 1442 Last data filed at 05/31/13 0515  Gross per 24 hour  Intake    492 ml  Output    450 ml  Net     42 ml    Last BM: 5/5  Labs:   Recent Labs Lab 05/26/13 1025 05/29/13 0527  NA 138 136*  K 3.5* 3.8  CL 100 97  CO2 25 27  BUN 14 21  CREATININE 0.90 0.99  CALCIUM 9.1 9.3  GLUCOSE 95 156*    CBG (last 3)   Recent Labs  05/30/13 2222 05/31/13 0829 05/31/13 1222  GLUCAP 115* 114*  110*    Scheduled Meds: . acyclovir  700 mg Intravenous Q8H  . allopurinol  300 mg Oral q morning - 10a  . amitriptyline  12.5 mg Oral QHS  . ampicillin (OMNIPEN) IV  2 g Intravenous 6 times per day  . calcium-vitamin D  1 tablet Oral QPM  . cefTRIAXone (ROCEPHIN)  IV  2 g Intravenous Q12H  . diltiazem  180 mg Oral q morning - 10a  . feeding supplement (GLUCERNA SHAKE)  237 mL Oral BID PC  . furosemide  40 mg Intravenous Daily  . glipiZIDE  2.5 mg Oral Q breakfast  . insulin aspart  0-15 Units Subcutaneous TID WC  . lidocaine  1 patch Transdermal Q24H  . metoprolol  25 mg Oral BID  . multivitamin with minerals  1 tablet Oral QPM  . polyethylene glycol  17 g Oral BID  . rivaroxaban  20 mg Oral Q supper  . vancomycin  1,000 mg Intravenous Q12H    Continuous Infusions: . sodium chloride      Brynda Greathouse, MS RD LDN Clinical Inpatient Dietitian Pager: [phone] Weekend/After hours pager: [phone]

## 2013-05-31 NOTE — Progress Notes (Addendum)
INFECTIOUS DISEASE PROGRESS NOTE  [name] is a 80 y.o. male with  Principal Problem:   Lower GI bleed Active Problems:   Hypertension   Gout   Hyperlipidemia   Deep vein thrombophlebitis of left leg   Cerebrovascular event   Prostate cancer   Atrial fibrillation   GI bleed   Paroxysmal a-fib  Subjective: Temp continues, 101.2 Large BM   Abtx:  Anti-infectives   Start     Dose/Rate Route Frequency Ordered Stop   05/31/13 1200  ampicillin (OMNIPEN) 2 g in sodium chloride 0.9 % 50 mL IVPB     2 g 150 mL/hr over 20 Minutes Intravenous 6 times per day 05/31/13 0826     05/31/13 0600  vancomycin (VANCOCIN) IVPB 1000 mg/200 mL premix     1,000 mg 200 mL/hr over 60 Minutes Intravenous Every 12 hours 05/30/13 1639     05/30/13 2200  cefTRIAXone (ROCEPHIN) 2 g in dextrose 5 % 50 mL IVPB     2 g 100 mL/hr over 30 Minutes Intravenous Every 12 hours 05/30/13 1621     05/30/13 1645  vancomycin (VANCOCIN) 2,000 mg in sodium chloride 0.9 % 500 mL IVPB     2,000 mg 250 mL/hr over 120 Minutes Intravenous STAT 05/30/13 1639 05/30/13 2003   05/30/13 1630  ampicillin (OMNIPEN) 1 g in sodium chloride 0.9 % 50 mL IVPB  Status:  Discontinued     1 g 150 mL/hr over 20 Minutes Intravenous 6 times per day 05/30/13 1621 05/31/13 0826   05/30/13 0600  acyclovir (ZOVIRAX) 700 mg in dextrose 5 % 100 mL IVPB     700 mg 114 mL/hr over 60 Minutes Intravenous Every 8 hours 05/30/13 0217     05/29/13 1800  acyclovir (ZOVIRAX) 965 mg in dextrose 5 % 150 mL IVPB  Status:  Discontinued     10 mg/kg  96.6 kg 169.3 mL/hr over 60 Minutes Intravenous Every 8 hours 05/29/13 1702 05/30/13 0217   05/19/13 2200  valACYclovir (VALTREX) tablet 500 mg  Status:  Discontinued     500 mg Oral 2 times daily 05/19/13 1726 05/20/13 1103      Medications:  Scheduled: . acyclovir  700 mg Intravenous Q8H  . allopurinol  300 mg Oral q morning - 10a  . amitriptyline  12.5 mg Oral QHS  . ampicillin  (OMNIPEN) IV  2 g Intravenous 6 times per day  . atorvastatin  10 mg Oral q1800  . calcium-vitamin D  1 tablet Oral QPM  . cefTRIAXone (ROCEPHIN)  IV  2 g Intravenous Q12H  . diltiazem  180 mg Oral q morning - 10a  . docusate sodium  100 mg Oral BID  . feeding supplement (GLUCERNA SHAKE)  237 mL Oral BID PC  . furosemide  40 mg Intravenous Daily  . glipiZIDE  2.5 mg Oral Q breakfast  . insulin aspart  0-15 Units Subcutaneous TID WC  . lidocaine  1 patch Transdermal Q24H  . metoprolol  25 mg Oral BID  . multivitamin with minerals  1 tablet Oral QPM  . polyethylene glycol  17 g Oral BID  . rivaroxaban  20 mg Oral Q supper  . sodium chloride  3 mL Intravenous Q12H  . vancomycin  1,000 mg Intravenous Q12H    Objective: Vital signs in last 24 hours: Temp:  [98.6 F (37 C)-101.2 F (38.4 C)] 101.2 F (38.4 C) (05/06 0515) Pulse Rate:  [97-105] 100 (05/06 1019) Resp:  [  18-20] 20 (05/06 0515) BP: (85-116)/(48-92) 113/92 mmHg (05/06 0515) SpO2:  [94 %-97 %] 94 % (05/06 0515) Weight:  [99 kg (218 lb 4.1 oz)-101.923 kg (224 lb 11.2 oz)] 99 kg (218 lb 4.1 oz) (05/06 0515)   General appearance: no distress Neck: no rigidity Resp: clear to auscultation bilaterally Cardio: regular rate and rhythm GI: normal findings: bowel sounds normal and soft, non-tender R groin with scarring from previous VZV. Tender. No active lesions seen. No cellulitis.    Lab Results  Recent Labs  05/29/13 0527  WBC 10.5  HGB 10.5*  HCT 31.3*  NA 136*  K 3.8  CL 97  CO2 27  BUN 21  CREATININE 0.99   Liver Panel  Recent Labs  05/29/13 0527  PROT 5.4*  ALBUMIN 2.2*  AST 12  ALT 14  ALKPHOS 57  BILITOT 0.3   Sedimentation Rate No results found for this basename: ESRSEDRATE,  in the last 72 hours C-Reactive Protein No results found for this basename: CRP,  in the last 72 hours  Microbiology: Recent Results (from the past 240 hour(s))  CULTURE, BLOOD (ROUTINE X 2)     Status: None    Collection Time    05/23/13  4:25 PM      Result Value Ref Range Status   Specimen Description BLOOD RIGHT HAND   Final   Special Requests BOTTLES DRAWN AEROBIC ONLY 10CC   Final   Culture  Setup Time     Final   Value: 05/23/2013 22:25     Performed at [facility]   Culture     Final   Value: NO GROWTH 5 DAYS     Performed at [facility]   Report Status 05/29/2013 FINAL   Final  CLOSTRIDIUM DIFFICILE BY PCR     Status: None   Collection Time    05/26/13  4:39 AM      Result Value Ref Range Status   C difficile by pcr NEGATIVE  NEGATIVE Final  CSF CULTURE     Status: None   Collection Time    05/29/13  2:20 PM      Result Value Ref Range Status   Specimen Description CSF   Final   Special Requests NONE   Final   Gram Stain     Final   Value: WBC PRESENT, PREDOMINANTLY MONONUCLEAR     NO ORGANISMS SEEN     CYTOSPIN Performed at [facility]     Performed at [facility]   Culture     Final   Value: NO GROWTH 2 DAYS     Performed at [facility]   Report Status PENDING   Incomplete  GRAM STAIN     Status: None   Collection Time    05/29/13  2:20 PM      Result Value Ref Range Status   Specimen Description CSF   Final   Special Requests NONE   Final   Gram Stain     Final   Value: WBC PRESENT, PREDOMINANTLY MONONUCLEAR     NO ORGANISMS SEEN     CYTOSPIN SLIDE   Report Status 05/29/2013 FINAL   Final    Studies/Results: Dg Chest Port 1 View  05/30/2013   CLINICAL DATA:  Fever  EXAM: PORTABLE CHEST - 1 VIEW  COMPARISON:  Prior radiograph from 05/27/2013  FINDINGS: Cardiomegaly again noted.  Lungs are mildly hypoinflated with secondary bronchovascular crowding. No focal infiltrate or pulmonary edema.  No pleural effusion. There is no pneumothorax.  Osseous structures are unchanged.  IMPRESSION: 1. No acute cardiopulmonary abnormality. 2. Stable cardiomegaly.   Electronically Signed   By: Jeannine Boga M.D.   On: 05/30/2013 19:48     Dg Fluoro Guide Lumbar Puncture  05/29/2013   CLINICAL DATA:  Mental status change with fevers.  EXAM: DIAGNOSTIC LUMBAR PUNCTURE UNDER FLUOROSCOPIC GUIDANCE  FLUOROSCOPY TIME:  1 min 0 seconds.  PROCEDURE: Informed consent was obtained from the patient prior to the procedure, including potential complications of headache, allergy, and pain. With the patient prone, the lower back was prepped with Betadine. 1% Lidocaine was used for local anesthesia. Lumbar puncture was performed at the L3-4 level using a 20 in gauge needle with return of clear CSF with an opening pressure of less than 9 cm water. 1ml of CSF were obtained for laboratory studies. The patient tolerated the procedure well and there were no apparent complications.  IMPRESSION: Successful lumbar puncture under fluoroscopy.   Electronically Signed   By: Lorin Picket M.D.   On: 05/29/2013 14:39     Assessment/Plan: Fever Recent Shingles Mental Status Change  Total days of antibiotics: Day 3 acyclovir Day 2 amp/vanco/ceftriaxone  Await CSF Cx Await CSF VZV, HSV Check abd films Stop laxatives Check C diff Stop colace and lipitor Primary to address nutrition         [facility] (pager) [phone] [email or web address] 05/31/2013, 2:04 PM  LOS: 12 days   **Disclaimer: This note may have been dictated with voice recognition software. Similar sounding words can inadvertently be transcribed and this note may contain transcription errors which may not have been corrected upon publication of note.**

## 2013-06-01 ENCOUNTER — Inpatient Hospital Stay (HOSPITAL_COMMUNITY): Payer: Medicare Other

## 2013-06-01 ENCOUNTER — Ambulatory Visit: Payer: Medicare Other | Admitting: Cardiovascular Disease

## 2013-06-01 DIAGNOSIS — I6350 Cerebral infarction due to unspecified occlusion or stenosis of unspecified cerebral artery: Secondary | ICD-10-CM

## 2013-06-01 DIAGNOSIS — Z86718 Personal history of other venous thrombosis and embolism: Secondary | ICD-10-CM

## 2013-06-01 LAB — LIPID PANEL
Cholesterol: 98 mg/dL (ref 0–200)
HDL: 29 mg/dL — ABNORMAL LOW (ref >39)
LDL CALC: 53 mg/dL (ref 0–99)
Total CHOL/HDL Ratio: 3.4 RATIO
Triglycerides: 82 mg/dL (ref <150)
VLDL: 16 mg/dL (ref 0–40)

## 2013-06-01 LAB — VARICELLA-ZOSTER BY PCR: Varicella-Zoster, PCR: NOT DETECTED

## 2013-06-01 LAB — CSF CULTURE W GRAM STAIN

## 2013-06-01 LAB — CBC
HEMATOCRIT: 34.7 % — AB (ref 39.0–52.0)
Hemoglobin: 11.6 g/dL — ABNORMAL LOW (ref 13.0–17.0)
MCH: 33.9 pg (ref 26.0–34.0)
MCHC: 33.4 g/dL (ref 30.0–36.0)
MCV: 101.5 fL — AB (ref 78.0–100.0)
Platelets: 254 10*3/uL (ref 150–400)
RBC: 3.42 MIL/uL — ABNORMAL LOW (ref 4.22–5.81)
RDW: 17 % — ABNORMAL HIGH (ref 11.5–15.5)
WBC: 10.6 10*3/uL — ABNORMAL HIGH (ref 4.0–10.5)

## 2013-06-01 LAB — GLUCOSE, CAPILLARY
GLUCOSE-CAPILLARY: 119 mg/dL — AB (ref 70–99)
Glucose-Capillary: 162 mg/dL — ABNORMAL HIGH (ref 70–99)
Glucose-Capillary: 164 mg/dL — ABNORMAL HIGH (ref 70–99)
Glucose-Capillary: 163 mg/dL — ABNORMAL HIGH (ref 70–99)

## 2013-06-01 LAB — BASIC METABOLIC PANEL
BUN: 12 mg/dL (ref 6–23)
CALCIUM: 9.5 mg/dL (ref 8.4–10.5)
CO2: 25 mEq/L (ref 19–32)
CREATININE: 0.84 mg/dL (ref 0.50–1.35)
Chloride: 98 mEq/L (ref 96–112)
GFR calc Af Amer: >90 mL/min (ref >90)
GFR, EST NON AFRICAN AMERICAN: 81 mL/min — AB (ref >90)
Glucose, Bld: 100 mg/dL — ABNORMAL HIGH (ref 70–99)
Potassium: 3.9 mEq/L (ref 3.7–5.3)
Sodium: 138 mEq/L (ref 137–147)

## 2013-06-01 LAB — CLOSTRIDIUM DIFFICILE BY PCR: Toxigenic C. Difficile by PCR: NEGATIVE

## 2013-06-01 LAB — HEMOGLOBIN A1C
HEMOGLOBIN A1C: 6.4 % — AB (ref <5.7)
Mean Plasma Glucose: 137 mg/dL — ABNORMAL HIGH (ref <117)

## 2013-06-01 LAB — CSF CULTURE: Culture: NO GROWTH

## 2013-06-01 LAB — URIC ACID: Uric Acid, Serum: 7 mg/dL (ref 4.0–7.8)

## 2013-06-01 MED ORDER — ASPIRIN 81 MG PO CHEW
81.0000 mg | CHEWABLE_TABLET | Freq: Every day | ORAL | Status: DC
  Administered 2013-06-01 – 2013-06-06 (×6): 81 mg via ORAL
  Filled 2013-06-01 (×6): qty 1

## 2013-06-01 MED ORDER — INSULIN GLARGINE 100 UNIT/ML ~~LOC~~ SOLN
15.0000 [IU] | Freq: Every day | SUBCUTANEOUS | Status: DC
  Administered 2013-06-01 – 2013-06-02 (×2): 15 [IU] via SUBCUTANEOUS
  Filled 2013-06-01 (×3): qty 0.15

## 2013-06-01 NOTE — Progress Notes (Signed)
VASCULAR LAB PRELIMINARY  PRELIMINARY  PRELIMINARY  PRELIMINARY  Bilateral lower extremity venous duplex and Carotid duplex completed.    Preliminary report:   1.  Venous:  Bilateral:  No evidence of DVT, superficial thrombosis, or Baker's Cyst. 2.  Carotid:  Bilateral:  1-39% ICA stenosis.  Vertebral artery flow is antegrade.      Nani Ravens, RVT 06/01/2013, 2:40 PM

## 2013-06-01 NOTE — Progress Notes (Signed)
PROGRESS NOTE  RAYNALD ROUILLARD CBJ:[id number] DOB: May 05, 1931 DOA: 05/19/2013 PCP: Leonides Grills, MD   Rashaan Wyles [name] is a 80 y.o. male  With a history of hypertension, hyperlipidemia, left leg DVT, coronary artery disease, history of rectal bleeding, prostate cancer status post radiation, presents emergency department for bright red blood per rectum. Patient states this has been ongoing for approximately 4 days. He had a similar episode in February of 2015 at which point he underwent a sigmoidoscopy which showed bleeding from a dilated capillary, which was due to self disimpaction. Patient was recently admitted on 05/07/2013 weakness and pain control shingles. He is currently taking Valtrex and prednisone. Patient is currently in pain. He only complains of shaking and weakness.  GI was consulted initially. Initially felt for no intervention if hgb remained stable. Pt later became febrile with tmax of 102 with ms changes. Pan cultures were unremarkable. Neurology was consulted who recommended LP. LP was notable for a wbc of 18. Empiric acyclovir was started. ID was consulted with recs for added vanc, rocephin, and amp.   Assessment/Plan:   Fever with decreased mental status -U/A clear -Chest x ray unremarkable -blood culture x 2 - prelim neg thus far -Source still unclear -Tmax 102.1 on 4/29 -Borderline CSF ID consulted and patient currently on empiric Vanco-ampicillin-Rocephin along with acyclovir , will monitor CSF cultures. -Neuro following for decreased mental status which could be encephalopathy, MRI x1 negative, repeat MRI shows Acute CVA.      Acute CVA  On repeat MRI 1.5 cm acute infarction affecting a left frontal gyrus at the vertex. Placed on ASA, continue on tele, obtain PT, OT, speech input, A1c and lipid panel, ordered TEE, carotid duplex, neurology following.   Lab Results  Component Value Date   HGBA1C 6.6* 12/01/2012    Lab Results  Component Value Date   CHOL 98 06/01/2013   HDL 29* 06/01/2013   LDLCALC 53 06/01/2013   TRIG 82 06/01/2013   CHOLHDL 3.4 06/01/2013       Baseline at home appears to be sitting in chair and pivoting to toilet ?AMS here- prob multifactorial vs polypharmacy? -Neurology consulted -Recommended MRI - unremarkable, repeat ordered -CSF as above     Painless hematochezia.   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 S/p sigmoidoscopy show no other source of bleeding about 6 weeks ago.  -started Miralax 2 to 3 times a day - changed to PRN as pt started having loose stools -Hgb 10.5 currently (was 13 on 4/24). Hemodynamically stable. Cont to follow closely -Resumed on home dose Xaralto closely monitor hemoglobin     Shingles  -treated but still painful -trying to minimize narcotics as patient it makes patient lethargic -added lidocaine patch -added amytrptyline -neurontin held     Hypertension  -Continue metoprolol and Lasix     Diabetes mellitus with neuropathy  -Hold glipizide placed on Lantus, continue ISS -Continue gabapentin for neuropathy - decreased dose per below -Last hemoglobin A1c November 2014 6.6  CBG (last 3)   Recent Labs  05/31/13 2220 06/01/13 0812 06/01/13 1223  GLUCAP 99 119* 162*      Chronic Anticoagulation - pt with hx of CVA in setting of afib - Pt with documented hx of LLE DVT s/p hip surgery - Pt is now s/p LP - discussed with fluro - Resumed xarelto - observe closely for bleed       Code Status: DNR Family Communication: family at bedside updated personally on 05/31/2013  Disposition Plan: PT eval-  recs SNF but family has 24 hour care givers and want to take home     Consultants:  GI  Neurology  ID  Procedures:  MRI brain 4/28  Repeat MRI 05/31/2013    LP   TEE  Carotid US     HPI/Subjective:  Today,  lying in bed in no discomfort. More awake, no headache, no chest-Abd pain.    Objective:  Intake/Output Summary (Last 24 hours) at  06/01/13 1240 Last data filed at 06/01/13 1103  Gross per 24 hour  Intake 602.17 ml  Output   1550 ml  Net -947.83 ml   Filed Weights   05/31/13 0500 05/31/13 0515 06/01/13 0450  Weight: 101.923 kg (224 lb 11.2 oz) 99 kg (218 lb 4.1 oz) 95.437 kg (210 lb 6.4 oz)    Filed Vitals:   05/31/13 1019 05/31/13 1444 05/31/13 2133 06/01/13 0450  BP:  120/63 127/78 109/61  Pulse: 100 105 101 98  Temp:  99.2 F (37.3 C) 98.5 F (36.9 C) 98.6 F (37 C)  TempSrc:  Axillary Axillary Axillary  Resp:  18 19 20   Height:      Weight:    95.437 kg (210 lb 6.4 oz)  SpO2:  94% 93% 100%   Exam:  General: Alert and less drowsy in the bed, in nad Cardiovascular: S1 S2 auscultated, no rubs, murmurs or gallops. Regular rate and rhythm.  Respiratory: Clear to auscultation bilaterally with equal chest rise  Abdomen: Soft, obese, nontender, nondistended, + bowel sounds  Neuro: moves all 4 ext- eyes closed Skin: scarring on right thigh  Data Reviewed: Basic Metabolic Panel:  Recent Labs Lab 05/26/13 1025 05/29/13 0527 06/01/13 0540  NA 138 136* 138  K 3.5* 3.8 3.9  CL 100 97 98  CO2 25 27 25   GLUCOSE 95 156* 100*  BUN 14 21 12   CREATININE 0.90 0.99 0.84  CALCIUM 9.1 9.3 9.5   Liver Function Tests:  Recent Labs Lab 05/29/13 0527  AST 12  ALT 14  ALKPHOS 57  BILITOT 0.3  PROT 5.4*  ALBUMIN 2.2*    Recent Labs Lab 05/30/13 1724  LIPASE 11  AMYLASE 20   No results found for this basename: AMMONIA,  in the last 168 hours CBC:  Recent Labs Lab 05/26/13 1025 05/29/13 0527 06/01/13 0540  WBC 6.7 10.5 10.6*  NEUTROABS 4.8  --   --   HGB 11.5* 10.5* 11.6*  HCT 33.7* 31.3* 34.7*  MCV 102.7* 100.0 101.5*  PLT 123* 161 254   Cardiac Enzymes: No results found for this basename: CKTOTAL, CKMB, CKMBINDEX, TROPONINI,  in the last 168 hours BNP (last 3 results) No results found for this basename: PROBNP,  in the last 8760 hours CBG:  Recent Labs Lab 05/31/13 1222  05/31/13 1700 05/31/13 2220 06/01/13 0812 06/01/13 1223  GLUCAP 110* 152* 99 119* 162*    Recent Results (from the past 240 hour(s))  CULTURE, BLOOD (ROUTINE X 2)     Status: None   Collection Time    05/23/13  4:25 PM      Result Value Ref Range Status   Specimen Description BLOOD RIGHT HAND   Final   Special Requests BOTTLES DRAWN AEROBIC ONLY 10CC   Final   Culture  Setup Time     Final   Value: 05/23/2013 22:25     Performed at [facility]   Culture     Final   Value: NO GROWTH 5 DAYS  Performed at [facility]   Report Status 05/29/2013 FINAL   Final  CLOSTRIDIUM DIFFICILE BY PCR     Status: None   Collection Time    05/26/13  4:39 AM      Result Value Ref Range Status   C difficile by pcr NEGATIVE  NEGATIVE Final  CSF CULTURE     Status: None   Collection Time    05/29/13  2:20 PM      Result Value Ref Range Status   Specimen Description CSF   Final   Special Requests NONE   Final   Gram Stain     Final   Value: WBC PRESENT, PREDOMINANTLY MONONUCLEAR     NO ORGANISMS SEEN     CYTOSPIN Performed at [facility]     Performed at [facility]   Culture     Final   Value: NO GROWTH 3 DAYS     Performed at [facility]   Report Status 06/01/2013 FINAL   Final  GRAM STAIN     Status: None   Collection Time    05/29/13  2:20 PM      Result Value Ref Range Status   Specimen Description CSF   Final   Special Requests NONE   Final   Gram Stain     Final   Value: WBC PRESENT, PREDOMINANTLY MONONUCLEAR     NO ORGANISMS SEEN     CYTOSPIN SLIDE   Report Status 05/29/2013 FINAL   Final  URINE CULTURE     Status: None   Collection Time    05/30/13  6:42 PM      Result Value Ref Range Status   Specimen Description URINE, CLEAN CATCH   Final   Special Requests NONE   Final   Culture  Setup Time     Final   Value: 05/30/2013 22:05     Performed at [facility]     Final   Value: 40,000 COLONIES/ML      Performed at [facility]   Culture     Final   Value: Multiple bacterial morphotypes present, none predominant. Suggest appropriate recollection if clinically indicated.     Performed at [facility]   Report Status 05/31/2013 FINAL   Final  CULTURE, BLOOD (ROUTINE X 2)     Status: None   Collection Time    05/31/13  8:18 AM      Result Value Ref Range Status   Specimen Description BLOOD LEFT ARM   Final   Special Requests BOTTLES DRAWN AEROBIC AND ANAEROBIC 5CC   Final   Culture  Setup Time     Final   Value: 05/31/2013 12:28     Performed at [facility]   Culture     Final   Value:        BLOOD CULTURE RECEIVED NO GROWTH TO DATE CULTURE WILL BE HELD FOR 5 DAYS BEFORE ISSUING A FINAL NEGATIVE REPORT     Performed at [facility]   Report Status PENDING   Incomplete  CLOSTRIDIUM DIFFICILE BY PCR     Status: None   Collection Time    05/31/13  7:50 PM      Result Value Ref Range Status   C difficile by pcr NEGATIVE  NEGATIVE Final     Studies: Dg Abd 1 View  05/31/2013   CLINICAL DATA:  Ileus  EXAM: ABDOMEN - 1 VIEW  COMPARISON:  DG ABD ACUTE W/CHEST dated 05/19/2013  FINDINGS: No dilated loops of large or small bowel. There is a moderate volume stool in the rectum. No pathologic calcifications. Degenerate changes of the spine. .  IMPRESSION: No evidence of bowel obstruction.   Electronically Signed   By: Suzy Bouchard M.D.   On: 05/31/2013 18:28   Mr Brain Wo Contrast  05/31/2013   CLINICAL DATA:  Altered mental status.  Confusion.  Agitation.  EXAM: MRI HEAD WITHOUT CONTRAST  TECHNIQUE: Multiplanar, multiecho pulse sequences of the brain and surrounding structures were obtained without intravenous contrast.  COMPARISON:  05/23/2013  FINDINGS: The examination is suffers from motion degradation. There is a 1.5 cm acute infarction affecting the a left frontal gyrus at the vertex. No other acute finding. There are old small vessel infarctions affecting the  cerebellum. Chronic small-vessel changes affect the pons in the cerebral hemispheric white matter. No evidence of mass lesion. No hemorrhage. No extra-axial collection.  IMPRESSION: 1.5 cm acute infarction affecting a left frontal gyrus at the vertex. No swelling or gross hemorrhage. This is new since the study of 04/28.  Chronic small-vessel changes elsewhere.   Electronically Signed   By: Nelson Chimes M.D.   On: 05/31/2013 16:38   Dg Chest Port 1 View  05/30/2013   CLINICAL DATA:  Fever  EXAM: PORTABLE CHEST - 1 VIEW  COMPARISON:  Prior radiograph from 05/27/2013  FINDINGS: Cardiomegaly again noted.  Lungs are mildly hypoinflated with secondary bronchovascular crowding. No focal infiltrate or pulmonary edema. No pleural effusion. There is no pneumothorax.  Osseous structures are unchanged.  IMPRESSION: 1. No acute cardiopulmonary abnormality. 2. Stable cardiomegaly.   Electronically Signed   By: Jeannine Boga M.D.   On: 05/30/2013 19:48   Dg Shoulder Left Port  06/01/2013   CLINICAL DATA:  Gout.  EXAM: PORTABLE LEFT SHOULDER - 2+ VIEW  COMPARISON:  None.  FINDINGS: There is no evidence of fracture or dislocation. There is no evidence of arthropathy or other focal bone abnormality. Soft tissues are unremarkable.  IMPRESSION: No acute soft tissue bony or abnormality identified. No evidence of fracture or dislocation. No evidence of arthropathy. No evidence to suggest gout.   Electronically Signed   By: Marcello Moores  Register   On: 06/01/2013 08:31    Scheduled Meds:  Anti-infectives   Start     Dose/Rate Route Frequency Ordered Stop   05/31/13 1200  ampicillin (OMNIPEN) 2 g in sodium chloride 0.9 % 50 mL IVPB     2 g 150 mL/hr over 20 Minutes Intravenous 6 times per day 05/31/13 0826     05/31/13 0600  vancomycin (VANCOCIN) IVPB 1000 mg/200 mL premix     1,000 mg 200 mL/hr over 60 Minutes Intravenous Every 12 hours 05/30/13 1639     05/30/13 2200  cefTRIAXone (ROCEPHIN) 2 g in dextrose 5 % 50 mL  IVPB     2 g 100 mL/hr over 30 Minutes Intravenous Every 12 hours 05/30/13 1621     05/30/13 1645  vancomycin (VANCOCIN) 2,000 mg in sodium chloride 0.9 % 500 mL IVPB     2,000 mg 250 mL/hr over 120 Minutes Intravenous STAT 05/30/13 1639 05/30/13 2003   05/30/13 1630  ampicillin (OMNIPEN) 1 g in sodium chloride 0.9 % 50 mL IVPB  Status:  Discontinued     1 g 150 mL/hr over 20 Minutes Intravenous 6 times per day 05/30/13 1621 05/31/13 0826   05/30/13 0600  acyclovir (ZOVIRAX) 700 mg  in dextrose 5 % 100 mL IVPB     700 mg 114 mL/hr over 60 Minutes Intravenous Every 8 hours 05/30/13 0217     05/29/13 1800  acyclovir (ZOVIRAX) 965 mg in dextrose 5 % 150 mL IVPB  Status:  Discontinued     10 mg/kg  96.6 kg 169.3 mL/hr over 60 Minutes Intravenous Every 8 hours 05/29/13 1702 05/30/13 0217   05/19/13 2200  valACYclovir (VALTREX) tablet 500 mg  Status:  Discontinued     500 mg Oral 2 times daily 05/19/13 1726 05/20/13 1103     . acyclovir  700 mg Intravenous Q8H  . allopurinol  300 mg Oral q morning - 10a  . amitriptyline  12.5 mg Oral QHS  . ampicillin (OMNIPEN) IV  2 g Intravenous 6 times per day  . aspirin  81 mg Oral Daily  . calcium-vitamin D  1 tablet Oral QPM  . cefTRIAXone (ROCEPHIN)  IV  2 g Intravenous Q12H  . diltiazem  180 mg Oral q morning - 10a  . feeding supplement (GLUCERNA SHAKE)  237 mL Oral BID PC  . furosemide  40 mg Intravenous Daily  . insulin aspart  0-15 Units Subcutaneous TID WC  . lidocaine  1 patch Transdermal Q24H  . metoprolol  25 mg Oral BID  . multivitamin with minerals  1 tablet Oral QPM  . polyethylene glycol  17 g Oral BID  . rivaroxaban  20 mg Oral Q supper  . vancomycin  1,000 mg Intravenous Q12H    Time spent: 35 min   06/01/2013, 12:40 PM  LOS: 13 days   Thurnell Lose M.D on 06/01/2013 at 12:40 PM  Between 7am to 7pm - Pager - [phone], After 7pm go to [email or web address] - password [email or web address]  And look for the night coverage person covering me  after hours  [facility]  Office  [phone]

## 2013-06-01 NOTE — Progress Notes (Addendum)
Physical Therapy Treatment Patient Details Name: [name] MRN: [id number] DOB: October 25, 1931 Today's Date: 06/01/2013    History of Present Illness history of hypertension, hyperlipidemia, left leg DVT, coronary artery disease, history of rectal bleeding, prostate cancer status post radiation, presents emergency department for bright red blood per rectum. Patient states this has been ongoing for approximately 4 days. He had a similar episode in February of 2015 at which point he underwent a sigmoidoscopy which showed bleeding from a dilated capillary, which was due to self disimpaction. Patient was recently admitted on 05/07/2013 weakness and pain control shingles. He is currently taking Valtrex and prednisone. Patient is currently in pain. He only complains of shaking and weakness..  In the past week on acute, pt has sustained an acute stroke.    PT Comments    Pt having to be encouragement to participate which is hard for patient with little accomplished each session.  Emphasized sitting balance/tolerance and bed mobility today.  Pt not alert or strong enough to work on standing or get OOB.  Is it an appropriate time to get palliative in to assist with goals of care?   Follow Up Recommendations  SNF     Equipment Recommendations  None recommended by PT    Recommendations for Other Services       Precautions / Restrictions Precautions Precautions: Fall    Mobility  Bed Mobility Overal bed mobility: Needs Assistance Bed Mobility: Rolling;Sidelying to Sit;Sit to Sidelying Rolling: Mod assist;Max assist;+2 for physical assistance (rolls more easily to the L) Sidelying to sit: Max assist;+2 for physical assistance   Sit to supine: Total assist;+2 for physical assistance   General bed mobility comments: cues for guidance and to help pt stay on task. Significant truncal assist  Transfers Overall transfer level: Needs assistance                  Ambulation/Gait                  Stairs            Wheelchair Mobility    Modified Rankin (Stroke Patients Only) Modified Rankin (Stroke Patients Only) Pre-Morbid Rankin Score: Moderate disability Modified Rankin: Severe disability     Balance Overall balance assessment: Needs assistance Sitting-balance support: Feet supported;Bilateral upper extremity supported;Single extremity supported Sitting balance-Leahy Scale: Poor Sitting balance - Comments: sat EOB working on sitting tolerance, truncal activation and  balance challenge for ~20 min                            Cognition Arousal/Alertness: Lethargic Behavior During Therapy: Agitated;Flat affect                        Exercises      General Comments General comments (skin integrity, edema, etc.): pt's L hand is more swollen and painful and he screams out when it is touched      Pertinent Vitals/Pain Mostly painful L arm/hand, but whole body is sore from relatively little movement.    Home Living                      Prior Function            PT Goals (current goals can now be found in the care plan section) Acute Rehab PT Goals Patient Stated Goal: none PT Goal Formulation: With patient/family Time For Goal Achievement: 06/05/13  Potential to Achieve Goals: Fair Progress towards PT goals: Not progressing toward goals - comment (max encouragement to get patient to participate)    Frequency  Min 2X/week    PT Plan Current plan remains appropriate    Co-evaluation             End of Session   Activity Tolerance: Patient limited by pain;Patient limited by lethargy Patient left: in bed;with call bell/phone within reach;with family/visitor present     Time: 1133-1222 PT Time Calculation (min): 49 min  Charges:  $Therapeutic Activity: 38-52 mins                    G CodesTessie Fass Maxi Carreras 06/01/2013, 3:34 PM 06/01/2013  Donnella Sham, PT [phone]   (pager)

## 2013-06-01 NOTE — Evaluation (Signed)
Clinical/Bedside Swallow Evaluation Patient Details  Name: [name] MRN: [id number] Date of Birth: 06-05-1931  Today's Date: 06/01/2013 Time: 7902-4097 SLP Time Calculation (min): 30 min  Past Medical History:  Past Medical History  Diagnosis Date  . Hypertension   . Gout     Acute episode involving ankle in 12/2011  . Hyperlipidemia   . Deep vein thrombophlebitis of left leg 11/2010    LLE; S/P THA  . Fasting hyperglycemia   . Stroke 08/2009; 07/2010    TIA in 07/2010  . H/O heat stroke 08/2009  . Prostate cancer 2013    adenocarcinoma; Gleason grade 8; PSA 9.45; EBRT 2013-14  . Renal cyst   . Syncope     01/2012: Negative pharmacologic stress nuclear  . PAF (paroxysmal atrial fibrillation)     a. first noted 01/2012 during admission for URI;  b. seen again 03/2012 after nstemi of unknown origin - pradaxa initiated.  Marland Kitchen CAD (coronary artery disease)     a. 01/2012 Elev Ti-->nonischemic MV;  b. 03/2012 NSTEMI/Cath: LM 50, LAD 50p/m, 45m/d, LCX nl, RI 60ost sup branch, RCA diff nonobs, EF 55%  . Radiation proctitis 02/2013  . NSTEMI (non-ST elevated myocardial infarction) 02/2013  . Diabetes mellitus without complication    Past Surgical History:  Past Surgical History  Procedure Laterality Date  . Hip arthroplasty  12/08/2010    ARTHROPLASTY BIPOLAR HIP;  Surgeon-Olin; Left  . 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 I&d extremity  05/28/2011    Procedure: IRRIGATION AND DEBRIDEMENT EXTREMITY;  Surgeon: Linna Hoff, MD;  Location: [facility];  Service: Orthopedics;  Laterality: Right;  I & D Right hand/wrist  . Colonoscopy   07/18/2003    RMR: Rectum internal hemorrhoids.  Otherwise, normal rectum/ Left-sided diverticula.  Remainder of colonic mucosa-nl  . Flexible sigmoidoscopy N/A 03/18/2013    Procedure: FLEXIBLE SIGMOIDOSCOPY;  Surgeon: Wonda Horner, MD;  Location: [facility] ENDOSCOPY;  Service: Endoscopy;  Laterality: N/A;  . Cardiac catheterization   03/2012    non-obstructive ASCAD, medial management only, pt does not want repeat cath   HPI:  Pt is a 80 year old male with history of recent shingles, protate cancer, rectal bleeding. Admitted with fever and AMS of unknown source. CXR has been clear. Second MRI shows 1.5 cm acute infarction affecting a left frontal gyrus at the vertex.    Assessment / Plan / Recommendation Clinical Impression  Pt presents with a pirmary oral dysphagia with solids due to decreased attention, missing dentition and generalized weakness. No evidence of aspiration with liquids though pt does need appropraite tactile cues for awareness of straw to lips to initiate sip. Recommend downgrade to dys 1/thin to facilitate intake. Family educated on aspiration precautions and signs of aspiration. Pt will need f/u for upgrade and tolerance and would benefit from cognitive linguistic eval given finding of CVA.     Aspiration Risk  Moderate    Diet Recommendation Dysphagia 1 (Puree);Thin liquid   Liquid Administration via: Straw Medication Administration: Whole meds with puree Supervision: Staff to assist with self feeding;Trained caregiver to feed patient Compensations: Slow rate;Small sips/bites;Check for pocketing Postural Changes and/or Swallow Maneuvers: Seated upright 90 degrees    Other  Recommendations Oral Care Recommendations: Oral care BID   Follow Up Recommendations  Skilled Nursing facility    Frequency and Duration min 2x/week  2 weeks   Pertinent Vitals/Pain NA  SLP Swallow Goals     Swallow Study Prior Functional Status       General HPI: Pt is a 80 year old male with history of recent shingles, protate cancer, rectal bleeding. Admitted with fever and AMS of unknown source. CXR has been clear. Second MRI shows 1.5 cm acute infarction affecting a left frontal gyrus at the vertex.  Type of Study: Bedside swallow evaluation Diet Prior to this Study: Regular;Thin liquids Temperature Spikes  Noted: Yes Respiratory Status: Room air History of Recent Intubation: No Behavior/Cognition: Requires cueing;Decreased sustained attention;Lethargic;Confused Oral Cavity - Dentition: Poor condition;Missing dentition Self-Feeding Abilities: Total assist Patient Positioning: Postural control interferes with function Baseline Vocal Quality: Clear Volitional Cough: Strong Volitional Swallow: Unable to elicit    Oral/Motor/Sensory Function Overall Oral Motor/Sensory Function: Other (comment) (will not follow commands well)   Ice Chips     Thin Liquid Thin Liquid: Impaired Presentation: Straw;Cup Oral Phase Impairments: Poor awareness of bolus;Other (comment) (tactile cues successful) Oral Phase Functional Implications: Right anterior spillage (leaning right)    Nectar Thick Nectar Thick Liquid: Not tested   Honey Thick Honey Thick Liquid: Not tested   Puree Puree: Impaired Presentation: Spoon Oral Phase Impairments: Poor awareness of bolus   Solid   GO    Solid: Impaired Presentation: Spoon Oral Phase Impairments: Impaired mastication;Poor awareness of bolus;Impaired anterior to posterior transit;Reduced lingual movement/coordination Oral Phase Functional Implications: Oral residue;Left lateral sulci pocketing;Right lateral sulci pocketing      Herbie Baltimore, MA CCC-SLP [phone]  Katherene Ponto Genese Quebedeaux 06/01/2013,9:57 AM

## 2013-06-01 NOTE — Progress Notes (Addendum)
INFECTIOUS DISEASE PROGRESS NOTE  ID: [name] is a 80 y.o. male with  Principal Problem:   Lower GI bleed Active Problems:   Hypertension   Gout   Hyperlipidemia   Deep vein thrombophlebitis of left leg   Cerebrovascular event   Prostate cancer   Atrial fibrillation   GI bleed   Paroxysmal a-fib  Subjective: Awake and interactive  Abtx:  Anti-infectives   Start     Dose/Rate Route Frequency Ordered Stop   05/31/13 1200  ampicillin (OMNIPEN) 2 g in sodium chloride 0.9 % 50 mL IVPB     2 g 150 mL/hr over 20 Minutes Intravenous 6 times per day 05/31/13 0826     05/31/13 0600  vancomycin (VANCOCIN) IVPB 1000 mg/200 mL premix     1,000 mg 200 mL/hr over 60 Minutes Intravenous Every 12 hours 05/30/13 1639     05/30/13 2200  cefTRIAXone (ROCEPHIN) 2 g in dextrose 5 % 50 mL IVPB     2 g 100 mL/hr over 30 Minutes Intravenous Every 12 hours 05/30/13 1621     05/30/13 1645  vancomycin (VANCOCIN) 2,000 mg in sodium chloride 0.9 % 500 mL IVPB     2,000 mg 250 mL/hr over 120 Minutes Intravenous STAT 05/30/13 1639 05/30/13 2003   05/30/13 1630  ampicillin (OMNIPEN) 1 g in sodium chloride 0.9 % 50 mL IVPB  Status:  Discontinued     1 g 150 mL/hr over 20 Minutes Intravenous 6 times per day 05/30/13 1621 05/31/13 0826   05/30/13 0600  acyclovir (ZOVIRAX) 700 mg in dextrose 5 % 100 mL IVPB     700 mg 114 mL/hr over 60 Minutes Intravenous Every 8 hours 05/30/13 0217     05/29/13 1800  acyclovir (ZOVIRAX) 965 mg in dextrose 5 % 150 mL IVPB  Status:  Discontinued     10 mg/kg  96.6 kg 169.3 mL/hr over 60 Minutes Intravenous Every 8 hours 05/29/13 1702 05/30/13 0217   05/19/13 2200  valACYclovir (VALTREX) tablet 500 mg  Status:  Discontinued     500 mg Oral 2 times daily 05/19/13 1726 05/20/13 1103      Medications:  Scheduled: . acyclovir  700 mg Intravenous Q8H  . allopurinol  300 mg Oral q morning - 10a  . amitriptyline  12.5 mg Oral QHS  . ampicillin (OMNIPEN) IV  2 g  Intravenous 6 times per day  . aspirin  81 mg Oral Daily  . calcium-vitamin D  1 tablet Oral QPM  . cefTRIAXone (ROCEPHIN)  IV  2 g Intravenous Q12H  . diltiazem  180 mg Oral q morning - 10a  . feeding supplement (GLUCERNA SHAKE)  237 mL Oral BID PC  . furosemide  40 mg Intravenous Daily  . insulin aspart  0-15 Units Subcutaneous TID WC  . insulin glargine  15 Units Subcutaneous Daily  . lidocaine  1 patch Transdermal Q24H  . metoprolol  25 mg Oral BID  . multivitamin with minerals  1 tablet Oral QPM  . polyethylene glycol  17 g Oral BID  . rivaroxaban  20 mg Oral Q supper  . vancomycin  1,000 mg Intravenous Q12H    Objective: Vital signs in last 24 hours: Temp:  [98.5 F (36.9 C)-99.2 F (37.3 C)] 98.6 F (37 C) (05/07 0450) Pulse Rate:  [98-105] 98 (05/07 0450) Resp:  [18-20] 20 (05/07 0450) BP: (109-127)/(61-78) 109/61 mmHg (05/07 0450) SpO2:  [93 %-100 %] 100 % (05/07 0450) Weight:  [  95.437 kg (210 lb 6.4 oz)] 95.437 kg (210 lb 6.4 oz) (05/07 0450)   General appearance: alert and no distress Resp: clear to auscultation bilaterally Cardio: regular rate and rhythm GI: normal findings: bowel sounds normal and soft, non-tender Extremities: L hand swollen, tender. R ankle swollen, tender.   Lab Results  Recent Labs  06/01/13 0540  WBC 10.6*  HGB 11.6*  HCT 34.7*  NA 138  K 3.9  CL 98  CO2 25  BUN 12  CREATININE 0.84   Liver Panel No results found for this basename: PROT, ALBUMIN, AST, ALT, ALKPHOS, BILITOT, BILIDIR, IBILI,  in the last 72 hours Sedimentation Rate No results found for this basename: ESRSEDRATE,  in the last 72 hours C-Reactive Protein No results found for this basename: CRP,  in the last 72 hours  Microbiology: Recent Results (from the past 240 hour(s))  CULTURE, BLOOD (ROUTINE X 2)     Status: None   Collection Time    05/23/13  4:25 PM      Result Value Ref Range Status   Specimen Description BLOOD RIGHT HAND   Final   Special Requests  BOTTLES DRAWN AEROBIC ONLY 10CC   Final   Culture  Setup Time     Final   Value: 05/23/2013 22:25     Performed at [facility]   Culture     Final   Value: NO GROWTH 5 DAYS     Performed at [facility]   Report Status 05/29/2013 FINAL   Final  CLOSTRIDIUM DIFFICILE BY PCR     Status: None   Collection Time    05/26/13  4:39 AM      Result Value Ref Range Status   C difficile by pcr NEGATIVE  NEGATIVE Final  CSF CULTURE     Status: None   Collection Time    05/29/13  2:20 PM      Result Value Ref Range Status   Specimen Description CSF   Final   Special Requests NONE   Final   Gram Stain     Final   Value: WBC PRESENT, PREDOMINANTLY MONONUCLEAR     NO ORGANISMS SEEN     CYTOSPIN Performed at [facility]     Performed at [facility]   Culture     Final   Value: NO GROWTH 3 DAYS     Performed at [facility]   Report Status 06/01/2013 FINAL   Final  GRAM STAIN     Status: None   Collection Time    05/29/13  2:20 PM      Result Value Ref Range Status   Specimen Description CSF   Final   Special Requests NONE   Final   Gram Stain     Final   Value: WBC PRESENT, PREDOMINANTLY MONONUCLEAR     NO ORGANISMS SEEN     CYTOSPIN SLIDE   Report Status 05/29/2013 FINAL   Final  URINE CULTURE     Status: None   Collection Time    05/30/13  6:42 PM      Result Value Ref Range Status   Specimen Description URINE, CLEAN CATCH   Final   Special Requests NONE   Final   Culture  Setup Time     Final   Value: 05/30/2013 22:05     Performed at [facility]     Final   Value: 40,000 COLONIES/ML  Performed at [facility]     Final   Value: Multiple bacterial morphotypes present, none predominant. Suggest appropriate recollection if clinically indicated.     Performed at [facility]   Report Status 05/31/2013 FINAL   Final  CULTURE, BLOOD (ROUTINE X 2)     Status: None   Collection Time     05/31/13  8:18 AM      Result Value Ref Range Status   Specimen Description BLOOD LEFT ARM   Final   Special Requests BOTTLES DRAWN AEROBIC AND ANAEROBIC 5CC   Final   Culture  Setup Time     Final   Value: 05/31/2013 12:28     Performed at [facility]   Culture     Final   Value:        BLOOD CULTURE RECEIVED NO GROWTH TO DATE CULTURE WILL BE HELD FOR 5 DAYS BEFORE ISSUING A FINAL NEGATIVE REPORT     Performed at [facility]   Report Status PENDING   Incomplete  CLOSTRIDIUM DIFFICILE BY PCR     Status: None   Collection Time    05/31/13  7:50 PM      Result Value Ref Range Status   C difficile by pcr NEGATIVE  NEGATIVE Final    Studies/Results: Dg Abd 1 View  05/31/2013   CLINICAL DATA:  Ileus  EXAM: ABDOMEN - 1 VIEW  COMPARISON:  DG ABD ACUTE W/CHEST dated 05/19/2013  FINDINGS: No dilated loops of large or small bowel. There is a moderate volume stool in the rectum. No pathologic calcifications. Degenerate changes of the spine. .  IMPRESSION: No evidence of bowel obstruction.   Electronically Signed   By: Suzy Bouchard M.D.   On: 05/31/2013 18:28   Mr Brain Wo Contrast  05/31/2013   CLINICAL DATA:  Altered mental status.  Confusion.  Agitation.  EXAM: MRI HEAD WITHOUT CONTRAST  TECHNIQUE: Multiplanar, multiecho pulse sequences of the brain and surrounding structures were obtained without intravenous contrast.  COMPARISON:  05/23/2013  FINDINGS: The examination is suffers from motion degradation. There is a 1.5 cm acute infarction affecting the a left frontal gyrus at the vertex. No other acute finding. There are old small vessel infarctions affecting the cerebellum. Chronic small-vessel changes affect the pons in the cerebral hemispheric white matter. No evidence of mass lesion. No hemorrhage. No extra-axial collection.  IMPRESSION: 1.5 cm acute infarction affecting a left frontal gyrus at the vertex. No swelling or gross hemorrhage. This is new since the study of 04/28.   Chronic small-vessel changes elsewhere.   Electronically Signed   By: Nelson Chimes M.D.   On: 05/31/2013 16:38   Dg Chest Port 1 View  05/30/2013   CLINICAL DATA:  Fever  EXAM: PORTABLE CHEST - 1 VIEW  COMPARISON:  Prior radiograph from 05/27/2013  FINDINGS: Cardiomegaly again noted.  Lungs are mildly hypoinflated with secondary bronchovascular crowding. No focal infiltrate or pulmonary edema. No pleural effusion. There is no pneumothorax.  Osseous structures are unchanged.  IMPRESSION: 1. No acute cardiopulmonary abnormality. 2. Stable cardiomegaly.   Electronically Signed   By: Jeannine Boga M.D.   On: 05/30/2013 19:48   Dg Shoulder Left Port  06/01/2013   CLINICAL DATA:  Gout.  EXAM: PORTABLE LEFT SHOULDER - 2+ VIEW  COMPARISON:  None.  FINDINGS: There is no evidence of fracture or dislocation. There is no evidence of arthropathy or other focal bone abnormality. Soft tissues are  unremarkable.  IMPRESSION: No acute soft tissue bony or abnormality identified. No evidence of fracture or dislocation. No evidence of arthropathy. No evidence to suggest gout.   Electronically Signed   By: Marcello Moores  Register   On: 06/01/2013 08:31     Assessment/Plan: Fever  Recent Shingles  L frontal Gyrus CVA Mental Status Change  Total days of antibiotics  Day  acyclovir  Day 3 amp/vanco/ceftriaxone  D/c enteric isolation Will stop acyclovir (his HSV was -, await VZV) Will d/c his anbx as his CSF Cx results.  Fever from his CVA? Gout?          [facility] (pager) [phone] [email or web address] 06/01/2013, 1:26 PM  LOS: 13 days   **Disclaimer: This note may have been dictated with voice recognition software. Similar sounding words can inadvertently be transcribed and this note may contain transcription errors which may not have been corrected upon publication of note.**

## 2013-06-01 NOTE — Progress Notes (Signed)
Subjective: Patient has remained lethargic.  Does awaken with stimulation and per family was taking something po this morning.  Has been afebrile for greater than 24 hours.    Objective: Current vital signs: BP 106/49  Pulse 102  Temp(Src) 97.9 F (36.6 C) (Oral)  Resp 19  Ht 5\' 8"  (1.727 m)  Wt 95.437 kg (210 lb 6.4 oz)  BMI 32.00 kg/m2  SpO2 96% Vital signs in last 24 hours: Temp:  [97.9 F (36.6 C)-98.6 F (37 C)] 97.9 F (36.6 C) (05/07 1346) Pulse Rate:  [98-102] 102 (05/07 1346) Resp:  [19-20] 19 (05/07 1346) BP: (106-127)/(49-78) 106/49 mmHg (05/07 1346) SpO2:  [93 %-100 %] 96 % (05/07 1346) Weight:  [95.437 kg (210 lb 6.4 oz)] 95.437 kg (210 lb 6.4 oz) (05/07 0450)  Intake/Output from previous day: 05/06 0701 - 05/07 0700 In: 602.2 [I.V.:324.2; IV Piggyback:278] Out: 1300 [Urine:1300] Intake/Output this shift: Total I/O In: -  Out: 750 [Urine:750] Nutritional status: Dysphagia  Neurologic Exam: Mental Status:  Laying in bed with eyes closed. Will open eyes to command. Able to focus on objects. Responds appropriately to questions asked with brief direct answers.  Cranial Nerves:  II: Visual fields (when eyelids held open he will blink to threat bilaterally), pupils equal, round, reactive to light and accommodation  III,IV, VI: holds eyelids closed, doll's intact  V,VII: face symmetric, moans when either side of face is touched  VIII: intact  IX,X: gag reflex present  XI: bilateral shoulder shrug  XII: midline tongue extension  Motor:  Withdraws bilaterally both UE and LE to pain but does not move extremities antigravity.  Sensory: moans in discomfort with light touch in all extremites  Deep Tendon Reflexes:  2+ throughout with absent AJ's bilaterally Plantars:  Right: downgoing   Left: downgoing      Lab Results: Basic Metabolic Panel:  Recent Labs Lab 05/26/13 1025 05/29/13 0527 06/01/13 0540  NA 138 136* 138  K 3.5* 3.8 3.9  CL 100 97 98   CO2 25 27 25   GLUCOSE 95 156* 100*  BUN 14 21 12   CREATININE 0.90 0.99 0.84  CALCIUM 9.1 9.3 9.5    Liver Function Tests:  Recent Labs Lab 05/29/13 0527  AST 12  ALT 14  ALKPHOS 57  BILITOT 0.3  PROT 5.4*  ALBUMIN 2.2*    Recent Labs Lab 05/30/13 1724  LIPASE 11  AMYLASE 20   No results found for this basename: AMMONIA,  in the last 168 hours  CBC:  Recent Labs Lab 05/26/13 1025 05/29/13 0527 06/01/13 0540  WBC 6.7 10.5 10.6*  NEUTROABS 4.8  --   --   HGB 11.5* 10.5* 11.6*  HCT 33.7* 31.3* 34.7*  MCV 102.7* 100.0 101.5*  PLT 123* 161 254    Cardiac Enzymes: No results found for this basename: CKTOTAL, CKMB, CKMBINDEX, TROPONINI,  in the last 168 hours  Lipid Panel:  Recent Labs Lab 06/01/13 0940  CHOL 98  TRIG 82  HDL 29*  CHOLHDL 3.4  VLDL 16  LDLCALC 53    CBG:  Recent Labs Lab 05/31/13 1222 05/31/13 1700 05/31/13 2220 06/01/13 0812 06/01/13 1223  GLUCAP 110* 152* 99 119* 162*    Microbiology: Results for orders placed during the hospital encounter of 05/19/13  MRSA PCR SCREENING     Status: None   Collection Time    05/19/13  7:37 PM      Result Value Ref Range Status   MRSA by PCR NEGATIVE  NEGATIVE Final   Comment:            The GeneXpert MRSA Assay (FDA     approved for NASAL specimens     only), is one component of a     comprehensive MRSA colonization     surveillance program. It is not     intended to diagnose MRSA     infection nor to guide or     monitor treatment for     MRSA infections.  CULTURE, BLOOD (ROUTINE X 2)     Status: None   Collection Time    05/23/13  4:25 PM      Result Value Ref Range Status   Specimen Description BLOOD RIGHT HAND   Final   Special Requests BOTTLES DRAWN AEROBIC ONLY 10CC   Final   Culture  Setup Time     Final   Value: 05/23/2013 22:25     Performed at [facility]   Culture     Final   Value: NO GROWTH 5 DAYS     Performed at [facility]   Report Status  05/29/2013 FINAL   Final  CLOSTRIDIUM DIFFICILE BY PCR     Status: None   Collection Time    05/26/13  4:39 AM      Result Value Ref Range Status   C difficile by pcr NEGATIVE  NEGATIVE Final  CSF CULTURE     Status: None   Collection Time    05/29/13  2:20 PM      Result Value Ref Range Status   Specimen Description CSF   Final   Special Requests NONE   Final   Gram Stain     Final   Value: WBC PRESENT, PREDOMINANTLY MONONUCLEAR     NO ORGANISMS SEEN     CYTOSPIN Performed at [facility]     Performed at [facility]   Culture     Final   Value: NO GROWTH 3 DAYS     Performed at [facility]   Report Status 06/01/2013 FINAL   Final  GRAM STAIN     Status: None   Collection Time    05/29/13  2:20 PM      Result Value Ref Range Status   Specimen Description CSF   Final   Special Requests NONE   Final   Gram Stain     Final   Value: WBC PRESENT, PREDOMINANTLY MONONUCLEAR     NO ORGANISMS SEEN     CYTOSPIN SLIDE   Report Status 05/29/2013 FINAL   Final  URINE CULTURE     Status: None   Collection Time    05/30/13  6:42 PM      Result Value Ref Range Status   Specimen Description URINE, CLEAN CATCH   Final   Special Requests NONE   Final   Culture  Setup Time     Final   Value: 05/30/2013 22:05     Performed at [place]     Final   Value: 40,000 COLONIES/ML     Performed at [facility]   Culture     Final   Value: Multiple bacterial morphotypes present, none predominant. Suggest appropriate recollection if clinically indicated.     Performed at [facility]   Report Status 05/31/2013 FINAL   Final  CULTURE, BLOOD (ROUTINE X 2)     Status: None   Collection Time  05/31/13  8:18 AM      Result Value Ref Range Status   Specimen Description BLOOD LEFT ARM   Final   Special Requests BOTTLES DRAWN AEROBIC AND ANAEROBIC 5CC   Final   Culture  Setup Time     Final   Value: 05/31/2013 12:28     Performed  at [facility]   Culture     Final   Value:        BLOOD CULTURE RECEIVED NO GROWTH TO DATE CULTURE WILL BE HELD FOR 5 DAYS BEFORE ISSUING A FINAL NEGATIVE REPORT     Performed at [facility]   Report Status PENDING   Incomplete  CLOSTRIDIUM DIFFICILE BY PCR     Status: None   Collection Time    05/31/13  7:50 PM      Result Value Ref Range Status   C difficile by pcr NEGATIVE  NEGATIVE Final    Coagulation Studies: No results found for this basename: LABPROT, INR,  in the last 72 hours  Imaging: Dg Abd 1 View  05/31/2013   CLINICAL DATA:  Ileus  EXAM: ABDOMEN - 1 VIEW  COMPARISON:  DG ABD ACUTE W/CHEST dated 05/19/2013  FINDINGS: No dilated loops of large or small bowel. There is a moderate volume stool in the rectum. No pathologic calcifications. Degenerate changes of the spine. .  IMPRESSION: No evidence of bowel obstruction.   Electronically Signed   By: Genevive Bi M.D.   On: 05/31/2013 18:28   Mr Brain Wo Contrast  05/31/2013   CLINICAL DATA:  Altered mental status.  Confusion.  Agitation.  EXAM: MRI HEAD WITHOUT CONTRAST  TECHNIQUE: Multiplanar, multiecho pulse sequences of the brain and surrounding structures were obtained without intravenous contrast.  COMPARISON:  05/23/2013  FINDINGS: The examination is suffers from motion degradation. There is a 1.5 cm acute infarction affecting the a left frontal gyrus at the vertex. No other acute finding. There are old small vessel infarctions affecting the cerebellum. Chronic small-vessel changes affect the pons in the cerebral hemispheric white matter. No evidence of mass lesion. No hemorrhage. No extra-axial collection.  IMPRESSION: 1.5 cm acute infarction affecting a left frontal gyrus at the vertex. No swelling or gross hemorrhage. This is new since the study of 04/28.  Chronic small-vessel changes elsewhere.   Electronically Signed   By: Paulina Fusi M.D.   On: 05/31/2013 16:38   Dg Chest Port 1 View  05/30/2013    CLINICAL DATA:  Fever  EXAM: PORTABLE CHEST - 1 VIEW  COMPARISON:  Prior radiograph from 05/27/2013  FINDINGS: Cardiomegaly again noted.  Lungs are mildly hypoinflated with secondary bronchovascular crowding. No focal infiltrate or pulmonary edema. No pleural effusion. There is no pneumothorax.  Osseous structures are unchanged.  IMPRESSION: 1. No acute cardiopulmonary abnormality. 2. Stable cardiomegaly.   Electronically Signed   By: Rise Mu M.D.   On: 05/30/2013 19:48   Dg Shoulder Left Port  06/01/2013   CLINICAL DATA:  Gout.  EXAM: PORTABLE LEFT SHOULDER - 2+ VIEW  COMPARISON:  None.  FINDINGS: There is no evidence of fracture or dislocation. There is no evidence of arthropathy or other focal bone abnormality. Soft tissues are unremarkable.  IMPRESSION: No acute soft tissue bony or abnormality identified. No evidence of fracture or dislocation. No evidence of arthropathy. No evidence to suggest gout.   Electronically Signed   By: Maisie Fus  Register   On: 06/01/2013 08:31    Medications:  I have  reviewed the patient's current medications. Scheduled: . allopurinol  300 mg Oral q morning - 10a  . amitriptyline  12.5 mg Oral QHS  . ampicillin (OMNIPEN) IV  2 g Intravenous 6 times per day  . aspirin  81 mg Oral Daily  . calcium-vitamin D  1 tablet Oral QPM  . cefTRIAXone (ROCEPHIN)  IV  2 g Intravenous Q12H  . diltiazem  180 mg Oral q morning - 10a  . feeding supplement (GLUCERNA SHAKE)  237 mL Oral BID PC  . furosemide  40 mg Intravenous Daily  . insulin aspart  0-15 Units Subcutaneous TID WC  . insulin glargine  15 Units Subcutaneous Daily  . lidocaine  1 patch Transdermal Q24H  . metoprolol  25 mg Oral BID  . multivitamin with minerals  1 tablet Oral QPM  . polyethylene glycol  17 g Oral BID  . rivaroxaban  20 mg Oral Q supper  . vancomycin  1,000 mg Intravenous Q12H    Assessment/Plan: Patient remains lethargic.  MRI of the brain repeated and reviewed.  There is a small  acute infarct in the left frontal gyrus.  Although acute infarct is small and is likely not the cause of the lethargy or the fever.  ID continues to be in consultation to address the fever.   Recommendations: 1.  Echocardiogram and carotid dopplers 2.  Continue ASA 3.  PT/OT/Speech 4.  Telemetry 5.  Frequent neuro checks 6.  A1c, lipid panel      LOS: 13 days   Alexis Goodell, MD [facility] [phone] 06/01/2013  3:16 PM

## 2013-06-02 ENCOUNTER — Encounter (HOSPITAL_COMMUNITY)
Admission: EM | Disposition: A | Discharge status: Skilled Nursing Facility | Payer: Self-pay | Source: Home / Self Care | Attending: Internal Medicine

## 2013-06-02 ENCOUNTER — Encounter (HOSPITAL_COMMUNITY): Payer: Self-pay

## 2013-06-02 HISTORY — PX: TEE WITHOUT CARDIOVERSION: SHX5443

## 2013-06-02 LAB — GLUCOSE, CAPILLARY
GLUCOSE-CAPILLARY: 403 mg/dL — AB (ref 70–99)
GLUCOSE-CAPILLARY: 341 mg/dL — AB (ref 70–99)
Glucose-Capillary: 121 mg/dL — ABNORMAL HIGH (ref 70–99)
Glucose-Capillary: 144 mg/dL — ABNORMAL HIGH (ref 70–99)
Glucose-Capillary: 463 mg/dL — ABNORMAL HIGH (ref 70–99)

## 2013-06-02 LAB — CBC
HEMATOCRIT: 36.6 % — AB (ref 39.0–52.0)
Hemoglobin: 12 g/dL — ABNORMAL LOW (ref 13.0–17.0)
MCH: 33.4 pg (ref 26.0–34.0)
MCHC: 32.8 g/dL (ref 30.0–36.0)
MCV: 101.9 fL — ABNORMAL HIGH (ref 78.0–100.0)
Platelets: 229 10*3/uL (ref 150–400)
RBC: 3.59 MIL/uL — ABNORMAL LOW (ref 4.22–5.81)
RDW: 16.6 % — ABNORMAL HIGH (ref 11.5–15.5)
WBC: 11.6 10*3/uL — ABNORMAL HIGH (ref 4.0–10.5)

## 2013-06-02 LAB — BASIC METABOLIC PANEL
BUN: 13 mg/dL (ref 6–23)
CO2: 26 mEq/L (ref 19–32)
CREATININE: 0.96 mg/dL (ref 0.50–1.35)
Calcium: 9.5 mg/dL (ref 8.4–10.5)
Chloride: 94 mEq/L — ABNORMAL LOW (ref 96–112)
GFR calc Af Amer: 89 mL/min — ABNORMAL LOW (ref >90)
GFR, EST NON AFRICAN AMERICAN: 77 mL/min — AB (ref >90)
GLUCOSE: 92 mg/dL (ref 70–99)
Potassium: 3 mEq/L — ABNORMAL LOW (ref 3.7–5.3)
SODIUM: 137 meq/L (ref 137–147)

## 2013-06-02 LAB — VANCOMYCIN, TROUGH: Vancomycin Tr: 25.3 ug/mL (ref 10.0–20.0)

## 2013-06-02 SURGERY — ECHOCARDIOGRAM, TRANSESOPHAGEAL
Anesthesia: Moderate Sedation

## 2013-06-02 MED ORDER — SODIUM CHLORIDE 0.9 % IV SOLN
1250.0000 mg | INTRAVENOUS | Status: DC
  Filled 2013-06-02: qty 1250

## 2013-06-02 MED ORDER — MIDAZOLAM HCL 5 MG/ML IJ SOLN
INTRAMUSCULAR | Status: AC
  Filled 2013-06-02: qty 2

## 2013-06-02 MED ORDER — POTASSIUM CHLORIDE 10 MEQ/100ML IV SOLN
10.0000 meq | INTRAVENOUS | Status: AC
  Administered 2013-06-02 (×2): 10 meq via INTRAVENOUS
  Filled 2013-06-02 (×2): qty 100

## 2013-06-02 MED ORDER — FENTANYL CITRATE 0.05 MG/ML IJ SOLN
INTRAMUSCULAR | Status: AC
  Filled 2013-06-02: qty 2

## 2013-06-02 MED ORDER — INSULIN ASPART 100 UNIT/ML ~~LOC~~ SOLN
0.0000 [IU] | Freq: Every day | SUBCUTANEOUS | Status: DC
  Administered 2013-06-02: 4 [IU] via SUBCUTANEOUS
  Administered 2013-06-03 – 2013-06-05 (×2): 2 [IU] via SUBCUTANEOUS

## 2013-06-02 MED ORDER — MIDAZOLAM HCL 10 MG/2ML IJ SOLN
INTRAMUSCULAR | Status: DC | PRN
  Administered 2013-06-02 (×2): 1 mg via INTRAVENOUS

## 2013-06-02 MED ORDER — INSULIN ASPART 100 UNIT/ML ~~LOC~~ SOLN
0.0000 [IU] | Freq: Three times a day (TID) | SUBCUTANEOUS | Status: DC
  Administered 2013-06-03: 5 [IU] via SUBCUTANEOUS
  Administered 2013-06-03: 3 [IU] via SUBCUTANEOUS
  Administered 2013-06-03: 8 [IU] via SUBCUTANEOUS
  Administered 2013-06-04: 3 [IU] via SUBCUTANEOUS
  Administered 2013-06-04: 5 [IU] via SUBCUTANEOUS
  Administered 2013-06-04 – 2013-06-05 (×3): 3 [IU] via SUBCUTANEOUS
  Administered 2013-06-05: 5 [IU] via SUBCUTANEOUS
  Administered 2013-06-06: 3 [IU] via SUBCUTANEOUS
  Administered 2013-06-06: 2 [IU] via SUBCUTANEOUS

## 2013-06-02 MED ORDER — POTASSIUM CHLORIDE 20 MEQ/15ML (10%) PO LIQD
40.0000 meq | Freq: Once | ORAL | Status: AC
Start: 2013-06-02 — End: 2013-06-02
  Administered 2013-06-02: 40 meq via ORAL
  Filled 2013-06-02: qty 30

## 2013-06-02 MED ORDER — BUTAMBEN-TETRACAINE-BENZOCAINE 2-2-14 % EX AERO
INHALATION_SPRAY | CUTANEOUS | Status: DC | PRN
  Administered 2013-06-02: 2 via TOPICAL

## 2013-06-02 MED ORDER — FENTANYL CITRATE 0.05 MG/ML IJ SOLN
INTRAMUSCULAR | Status: DC | PRN
  Administered 2013-06-02: 25 ug via INTRAVENOUS

## 2013-06-02 MED ORDER — VANCOMYCIN HCL 10 G IV SOLR
1250.0000 mg | INTRAVENOUS | Status: DC
  Filled 2013-06-02: qty 1250

## 2013-06-02 MED ORDER — METHYLPREDNISOLONE SODIUM SUCC 125 MG IJ SOLR
60.0000 mg | Freq: Every day | INTRAMUSCULAR | Status: AC
Start: 2013-06-03 — End: 2013-06-04
  Administered 2013-06-03 – 2013-06-04 (×2): 60 mg via INTRAVENOUS
  Filled 2013-06-02 (×2): qty 0.96

## 2013-06-02 MED ORDER — COLCHICINE 0.6 MG PO TABS
0.6000 mg | ORAL_TABLET | Freq: Two times a day (BID) | ORAL | Status: DC
  Administered 2013-06-02 – 2013-06-06 (×8): 0.6 mg via ORAL
  Filled 2013-06-02 (×13): qty 1

## 2013-06-02 MED ORDER — INSULIN ASPART 100 UNIT/ML ~~LOC~~ SOLN
20.0000 [IU] | Freq: Once | SUBCUTANEOUS | Status: AC
  Administered 2013-06-02: 20 [IU] via SUBCUTANEOUS

## 2013-06-02 MED ORDER — INSULIN ASPART 100 UNIT/ML ~~LOC~~ SOLN
4.0000 [IU] | Freq: Three times a day (TID) | SUBCUTANEOUS | Status: DC
  Administered 2013-06-03 – 2013-06-06 (×9): 4 [IU] via SUBCUTANEOUS

## 2013-06-02 MED ORDER — ATORVASTATIN CALCIUM 20 MG PO TABS
20.0000 mg | ORAL_TABLET | Freq: Every day | ORAL | Status: DC
  Administered 2013-06-02 – 2013-06-04 (×3): 20 mg via ORAL
  Filled 2013-06-02 (×5): qty 1

## 2013-06-02 MED ORDER — INSULIN GLARGINE 100 UNIT/ML ~~LOC~~ SOLN
15.0000 [IU] | Freq: Once | SUBCUTANEOUS | Status: AC
  Administered 2013-06-02: 15 [IU] via SUBCUTANEOUS
  Filled 2013-06-02: qty 0.15

## 2013-06-02 MED ORDER — METHYLPREDNISOLONE SODIUM SUCC 125 MG IJ SOLR
60.0000 mg | Freq: Two times a day (BID) | INTRAMUSCULAR | Status: DC
  Administered 2013-06-02: 60 mg via INTRAVENOUS
  Filled 2013-06-02: qty 2
  Filled 2013-06-02 (×3): qty 0.96

## 2013-06-02 MED ORDER — POTASSIUM CHLORIDE CRYS ER 20 MEQ PO TBCR
40.0000 meq | EXTENDED_RELEASE_TABLET | Freq: Once | ORAL | Status: DC
  Filled 2013-06-02: qty 2

## 2013-06-02 MED ORDER — SODIUM CHLORIDE 0.9 % IV SOLN: INTRAVENOUS | Status: DC

## 2013-06-02 NOTE — Progress Notes (Signed)
CRITICAL VALUE ALERT  Critical value received:  Blood sugar 403  Date of notification:  06/02/13  Time of notification:  1730  Critical value read back:yes  Nurse who received alert:  Oren Bracket  MD notified (1st page):  Dr. Candiss Norse  Time of first page:  1730  MD notified (2nd page):  Time of second page:  Responding MD:  Dr. Candiss Norse  Time MD responded:  [phone]

## 2013-06-02 NOTE — Progress Notes (Signed)
ANTIBIOTIC CONSULT NOTE - FOLLOW UP  Pharmacy Consult for Vancomycin Indication: meningitis  Allergies  Allergen Reactions  . Tetanus Toxoids Swelling    Also allergic to "high powered pain medications."  . Oxycodone     Makes him loopy    Patient Measurements: Height: 5\' 8"  (172.7 cm) Weight: 226 lb 12.8 oz (102.876 kg) IBW/kg (Calculated) : 68.4 Adjusted Body Weight:   Vital Signs: Temp: 98.1 F (36.7 C) (05/08 1350) Temp src: Oral (05/08 1350) BP: 94/57 mmHg (05/08 1350) Pulse Rate: 90 (05/08 1350) Intake/Output from previous day: 05/07 0701 - 05/08 0700 In: 2908 [I.V.:746.7; IV Piggyback:2161.3] Out: 1075 [Urine:1075] Intake/Output from this shift: Total I/O In: 0  Out: 425 [Urine:425]  Labs:  Recent Labs  06/01/13 0540 06/02/13 0645  WBC 10.6* 11.6*  HGB 11.6* 12.0*  PLT 254 229  CREATININE 0.84 0.96   Estimated Creatinine Clearance: 72.5 ml/min (by C-G formula based on Cr of 0.96).  Recent Labs  06/02/13 0645  Edwards 25.3*     Microbiology: Recent Results (from the past 720 hour(s))  MRSA PCR SCREENING     Status: Abnormal   Collection Time    05/07/13  5:00 AM      Result Value Ref Range Status   MRSA by PCR POSITIVE (*) NEGATIVE Final   Comment:            The GeneXpert MRSA Assay (FDA     approved for NASAL specimens     only), is one component of a     comprehensive MRSA colonization     surveillance program. It is not     intended to diagnose MRSA     infection nor to guide or     monitor treatment for     MRSA infections.     RESULT CALLED TO, READ BACK BY AND VERIFIED WITH:     SURLES,M AT 1017 ON 05/07/13 BY GODFREY,O  MRSA PCR SCREENING     Status: None   Collection Time    05/19/13  7:37 PM      Result Value Ref Range Status   MRSA by PCR NEGATIVE  NEGATIVE Final   Comment:            The GeneXpert MRSA Assay (FDA     approved for NASAL specimens     only), is one component of a     comprehensive MRSA colonization      surveillance program. It is not     intended to diagnose MRSA     infection nor to guide or     monitor treatment for     MRSA infections.  CULTURE, BLOOD (ROUTINE X 2)     Status: None   Collection Time    05/23/13  4:25 PM      Result Value Ref Range Status   Specimen Description BLOOD RIGHT HAND   Final   Special Requests BOTTLES DRAWN AEROBIC ONLY 10CC   Final   Culture  Setup Time     Final   Value: 05/23/2013 22:25     Performed at [facility]   Culture     Final   Value: NO GROWTH 5 DAYS     Performed at [facility]   Report Status 05/29/2013 FINAL   Final  CLOSTRIDIUM DIFFICILE BY PCR     Status: None   Collection Time    05/26/13  4:39 AM      Result Value Ref Range Status  C difficile by pcr NEGATIVE  NEGATIVE Final  CSF CULTURE     Status: None   Collection Time    05/29/13  2:20 PM      Result Value Ref Range Status   Specimen Description CSF   Final   Special Requests NONE   Final   Gram Stain     Final   Value: WBC PRESENT, PREDOMINANTLY MONONUCLEAR     NO ORGANISMS SEEN     CYTOSPIN Performed at [facility]     Performed at [facility]   Culture     Final   Value: NO GROWTH 3 DAYS     Performed at [facility]   Report Status 06/01/2013 FINAL   Final  GRAM STAIN     Status: None   Collection Time    05/29/13  2:20 PM      Result Value Ref Range Status   Specimen Description CSF   Final   Special Requests NONE   Final   Gram Stain     Final   Value: WBC PRESENT, PREDOMINANTLY MONONUCLEAR     NO ORGANISMS SEEN     CYTOSPIN SLIDE   Report Status 05/29/2013 FINAL   Final  URINE CULTURE     Status: None   Collection Time    05/30/13  6:42 PM      Result Value Ref Range Status   Specimen Description URINE, CLEAN CATCH   Final   Special Requests NONE   Final   Culture  Setup Time     Final   Value: 05/30/2013 22:05     Performed at [place]     Final   Value: 40,000 COLONIES/ML      Performed at [facility]   Culture     Final   Value: Multiple bacterial morphotypes present, none predominant. Suggest appropriate recollection if clinically indicated.     Performed at [facility]   Report Status 05/31/2013 FINAL   Final  CULTURE, BLOOD (ROUTINE X 2)     Status: None   Collection Time    05/31/13  8:18 AM      Result Value Ref Range Status   Specimen Description BLOOD LEFT ARM   Final   Special Requests BOTTLES DRAWN AEROBIC AND ANAEROBIC 5CC   Final   Culture  Setup Time     Final   Value: 05/31/2013 12:28     Performed at [facility]   Culture     Final   Value:        BLOOD CULTURE RECEIVED NO GROWTH TO DATE CULTURE WILL BE HELD FOR 5 DAYS BEFORE ISSUING A FINAL NEGATIVE REPORT     Performed at [facility]   Report Status PENDING   Incomplete  CLOSTRIDIUM DIFFICILE BY PCR     Status: None   Collection Time    05/31/13  7:50 PM      Result Value Ref Range Status   C difficile by pcr NEGATIVE  NEGATIVE Final    Anti-infectives   Start     Dose/Rate Route Frequency Ordered Stop   06/03/13 0600  vancomycin (VANCOCIN) 1,250 mg in sodium chloride 0.9 % 250 mL IVPB     1,250 mg 166.7 mL/hr over 90 Minutes Intravenous Every 24 hours 06/02/13 1432     05/31/13 1200  ampicillin (OMNIPEN) 2 g in sodium chloride 0.9 % 50 mL IVPB  2 g 150 mL/hr over 20 Minutes Intravenous 6 times per day 05/31/13 0826     05/31/13 0600  vancomycin (VANCOCIN) IVPB 1000 mg/200 mL premix  Status:  Discontinued     1,000 mg 200 mL/hr over 60 Minutes Intravenous Every 12 hours 05/30/13 1639 06/02/13 1432   05/30/13 2200  cefTRIAXone (ROCEPHIN) 2 g in dextrose 5 % 50 mL IVPB     2 g 100 mL/hr over 30 Minutes Intravenous Every 12 hours 05/30/13 1621     05/30/13 1645  vancomycin (VANCOCIN) 2,000 mg in sodium chloride 0.9 % 500 mL IVPB     2,000 mg 250 mL/hr over 120 Minutes Intravenous STAT 05/30/13 1639 05/30/13 2003   05/30/13 1630  ampicillin  (OMNIPEN) 1 g in sodium chloride 0.9 % 50 mL IVPB  Status:  Discontinued     1 g 150 mL/hr over 20 Minutes Intravenous 6 times per day 05/30/13 1621 05/31/13 0826   05/30/13 0600  acyclovir (ZOVIRAX) 700 mg in dextrose 5 % 100 mL IVPB  Status:  Discontinued     700 mg 114 mL/hr over 60 Minutes Intravenous Every 8 hours 05/30/13 0217 06/01/13 1337   05/29/13 1800  acyclovir (ZOVIRAX) 965 mg in dextrose 5 % 150 mL IVPB  Status:  Discontinued     10 mg/kg  96.6 kg 169.3 mL/hr over 60 Minutes Intravenous Every 8 hours 05/29/13 1702 05/30/13 0217   05/19/13 2200  valACYclovir (VALTREX) tablet 500 mg  Status:  Discontinued     500 mg Oral 2 times daily 05/19/13 1726 05/20/13 1103      Assessment: 80yo male with R/O meningitis, all cx NTD.  Vancomycin trough 25.3, drawn before dose hung & RN instructed to stop infusion but most of dose infused.   Cr remains < 1.  Goal of Therapy:  Vancomycin trough level 15-20 mcg/ml  Plan:  1-  Change Vancomycin to 1250mg  IV q24, next dose at 0100 (~18hr since last dose) 2-  F/U cx data 3-  Check ss trough  Gracy Bruins, PharmD Clinical Pharmacist [facility]

## 2013-06-02 NOTE — Progress Notes (Signed)
INFECTIOUS DISEASE PROGRESS NOTE  ID: [name] is a 80 y.o. male with  Principal Problem:   Lower GI bleed Active Problems:   Hypertension   Gout   Hyperlipidemia   Deep vein thrombophlebitis of left leg   Cerebrovascular event   Prostate cancer   Atrial fibrillation   GI bleed   Paroxysmal a-fib  Subjective: More awake and alert. Interactive. Taking PO with assist from OT/PT  Abtx:  Anti-infectives   Start     Dose/Rate Route Frequency Ordered Stop   06/03/13 0600  vancomycin (VANCOCIN) 1,250 mg in sodium chloride 0.9 % 250 mL IVPB  Status:  Discontinued     1,250 mg 166.7 mL/hr over 90 Minutes Intravenous Every 24 hours 06/02/13 1432 06/02/13 1436   06/02/13 0015  vancomycin (VANCOCIN) 1,250 mg in sodium chloride 0.9 % 250 mL IVPB     1,250 mg 166.7 mL/hr over 90 Minutes Intravenous Every 24 hours 06/02/13 1436     05/31/13 1200  ampicillin (OMNIPEN) 2 g in sodium chloride 0.9 % 50 mL IVPB     2 g 150 mL/hr over 20 Minutes Intravenous 6 times per day 05/31/13 0826     05/31/13 0600  vancomycin (VANCOCIN) IVPB 1000 mg/200 mL premix  Status:  Discontinued     1,000 mg 200 mL/hr over 60 Minutes Intravenous Every 12 hours 05/30/13 1639 06/02/13 1432   05/30/13 2200  cefTRIAXone (ROCEPHIN) 2 g in dextrose 5 % 50 mL IVPB     2 g 100 mL/hr over 30 Minutes Intravenous Every 12 hours 05/30/13 1621     05/30/13 1645  vancomycin (VANCOCIN) 2,000 mg in sodium chloride 0.9 % 500 mL IVPB     2,000 mg 250 mL/hr over 120 Minutes Intravenous STAT 05/30/13 1639 05/30/13 2003   05/30/13 1630  ampicillin (OMNIPEN) 1 g in sodium chloride 0.9 % 50 mL IVPB  Status:  Discontinued     1 g 150 mL/hr over 20 Minutes Intravenous 6 times per day 05/30/13 1621 05/31/13 0826   05/30/13 0600  acyclovir (ZOVIRAX) 700 mg in dextrose 5 % 100 mL IVPB  Status:  Discontinued     700 mg 114 mL/hr over 60 Minutes Intravenous Every 8 hours 05/30/13 0217 06/01/13 1337   05/29/13 1800  acyclovir  (ZOVIRAX) 965 mg in dextrose 5 % 150 mL IVPB  Status:  Discontinued     10 mg/kg  96.6 kg 169.3 mL/hr over 60 Minutes Intravenous Every 8 hours 05/29/13 1702 05/30/13 0217   05/19/13 2200  valACYclovir (VALTREX) tablet 500 mg  Status:  Discontinued     500 mg Oral 2 times daily 05/19/13 1726 05/20/13 1103      Medications:  Scheduled: . allopurinol  300 mg Oral q morning - 10a  . amitriptyline  12.5 mg Oral QHS  . ampicillin (OMNIPEN) IV  2 g Intravenous 6 times per day  . aspirin  81 mg Oral Daily  . atorvastatin  20 mg Oral q1800  . calcium-vitamin D  1 tablet Oral QPM  . cefTRIAXone (ROCEPHIN)  IV  2 g Intravenous Q12H  . colchicine  0.6 mg Oral BID  . diltiazem  180 mg Oral q morning - 10a  . feeding supplement (GLUCERNA SHAKE)  237 mL Oral BID PC  . insulin aspart  0-15 Units Subcutaneous TID WC  . insulin aspart  4 Units Subcutaneous TID WC  . insulin glargine  15 Units Subcutaneous Daily  . lidocaine  1  patch Transdermal Q24H  . methylPREDNISolone (SOLU-MEDROL) injection  60 mg Intravenous Q12H  . metoprolol  25 mg Oral BID  . multivitamin with minerals  1 tablet Oral QPM  . polyethylene glycol  17 g Oral BID  . rivaroxaban  20 mg Oral Q supper  . vancomycin  1,250 mg Intravenous Q24H    Objective: Vital signs in last 24 hours: Temp:  [97.2 F (36.2 C)-100.6 F (38.1 C)] 98.1 F (36.7 C) (05/08 1350) Pulse Rate:  [90-120] 90 (05/08 1350) Resp:  [18-29] 20 (05/08 1350) BP: (94-134)/(52-83) 94/57 mmHg (05/08 1350) SpO2:  [92 %-99 %] 94 % (05/08 1350) Weight:  [102.876 kg (226 lb 12.8 oz)] 102.876 kg (226 lb 12.8 oz) (05/08 0648)   General appearance: alert, cooperative and no distress Resp: clear to auscultation bilaterally Cardio: regular rate and rhythm GI: normal findings: bowel sounds normal and soft, non-tender Extremities: edema anasarca UE.  Lab Results  Recent Labs  06/01/13 0540 06/02/13 0645  WBC 10.6* 11.6*  HGB 11.6* 12.0*  HCT 34.7* 36.6*    NA 138 137  K 3.9 3.0*  CL 98 94*  CO2 25 26  BUN 12 13  CREATININE 0.84 0.96   Liver Panel No results found for this basename: PROT, ALBUMIN, AST, ALT, ALKPHOS, BILITOT, BILIDIR, IBILI,  in the last 72 hours Sedimentation Rate No results found for this basename: ESRSEDRATE,  in the last 72 hours C-Reactive Protein No results found for this basename: CRP,  in the last 72 hours  Microbiology: Recent Results (from the past 240 hour(s))  CULTURE, BLOOD (ROUTINE X 2)     Status: None   Collection Time    05/23/13  4:25 PM      Result Value Ref Range Status   Specimen Description BLOOD RIGHT HAND   Final   Special Requests BOTTLES DRAWN AEROBIC ONLY 10CC   Final   Culture  Setup Time     Final   Value: 05/23/2013 22:25     Performed at [facility]   Culture     Final   Value: NO GROWTH 5 DAYS     Performed at [facility]   Report Status 05/29/2013 FINAL   Final  CLOSTRIDIUM DIFFICILE BY PCR     Status: None   Collection Time    05/26/13  4:39 AM      Result Value Ref Range Status   C difficile by pcr NEGATIVE  NEGATIVE Final  CSF CULTURE     Status: None   Collection Time    05/29/13  2:20 PM      Result Value Ref Range Status   Specimen Description CSF   Final   Special Requests NONE   Final   Gram Stain     Final   Value: WBC PRESENT, PREDOMINANTLY MONONUCLEAR     NO ORGANISMS SEEN     CYTOSPIN Performed at [facility]     Performed at [facility]   Culture     Final   Value: NO GROWTH 3 DAYS     Performed at [facility]   Report Status 06/01/2013 FINAL   Final  GRAM STAIN     Status: None   Collection Time    05/29/13  2:20 PM      Result Value Ref Range Status   Specimen Description CSF   Final   Special Requests NONE   Final   Gram Stain  Final   Value: WBC PRESENT, PREDOMINANTLY MONONUCLEAR     NO ORGANISMS SEEN     CYTOSPIN SLIDE   Report Status 05/29/2013 FINAL   Final  URINE CULTURE     Status: None    Collection Time    05/30/13  6:42 PM      Result Value Ref Range Status   Specimen Description URINE, CLEAN CATCH   Final   Special Requests NONE   Final   Culture  Setup Time     Final   Value: 05/30/2013 22:05     Performed at [place]     Final   Value: 40,000 COLONIES/ML     Performed at [facility]   Culture     Final   Value: Multiple bacterial morphotypes present, none predominant. Suggest appropriate recollection if clinically indicated.     Performed at [facility]   Report Status 05/31/2013 FINAL   Final  CULTURE, BLOOD (ROUTINE X 2)     Status: None   Collection Time    05/31/13  8:18 AM      Result Value Ref Range Status   Specimen Description BLOOD LEFT ARM   Final   Special Requests BOTTLES DRAWN AEROBIC AND ANAEROBIC 5CC   Final   Culture  Setup Time     Final   Value: 05/31/2013 12:28     Performed at [facility]   Culture     Final   Value:        BLOOD CULTURE RECEIVED NO GROWTH TO DATE CULTURE WILL BE HELD FOR 5 DAYS BEFORE ISSUING A FINAL NEGATIVE REPORT     Performed at [facility]   Report Status PENDING   Incomplete  CLOSTRIDIUM DIFFICILE BY PCR     Status: None   Collection Time    05/31/13  7:50 PM      Result Value Ref Range Status   C difficile by pcr NEGATIVE  NEGATIVE Final    Studies/Results: Dg Abd 1 View  05/31/2013   CLINICAL DATA:  Ileus  EXAM: ABDOMEN - 1 VIEW  COMPARISON:  DG ABD ACUTE W/CHEST dated 05/19/2013  FINDINGS: No dilated loops of large or small bowel. There is a moderate volume stool in the rectum. No pathologic calcifications. Degenerate changes of the spine. .  IMPRESSION: No evidence of bowel obstruction.   Electronically Signed   By: Suzy Bouchard M.D.   On: 05/31/2013 18:28   Mr Brain Wo Contrast  05/31/2013   CLINICAL DATA:  Altered mental status.  Confusion.  Agitation.  EXAM: MRI HEAD WITHOUT CONTRAST  TECHNIQUE: Multiplanar, multiecho pulse sequences of the  brain and surrounding structures were obtained without intravenous contrast.  COMPARISON:  05/23/2013  FINDINGS: The examination is suffers from motion degradation. There is a 1.5 cm acute infarction affecting the a left frontal gyrus at the vertex. No other acute finding. There are old small vessel infarctions affecting the cerebellum. Chronic small-vessel changes affect the pons in the cerebral hemispheric white matter. No evidence of mass lesion. No hemorrhage. No extra-axial collection.  IMPRESSION: 1.5 cm acute infarction affecting a left frontal gyrus at the vertex. No swelling or gross hemorrhage. This is new since the study of 04/28.  Chronic small-vessel changes elsewhere.   Electronically Signed   By: Nelson Chimes M.D.   On: 05/31/2013 16:38   Dg Shoulder Left Port  06/01/2013   CLINICAL DATA:  Gout.  EXAM:  PORTABLE LEFT SHOULDER - 2+ VIEW  COMPARISON:  None.  FINDINGS: There is no evidence of fracture or dislocation. There is no evidence of arthropathy or other focal bone abnormality. Soft tissues are unremarkable.  IMPRESSION: No acute soft tissue bony or abnormality identified. No evidence of fracture or dislocation. No evidence of arthropathy. No evidence to suggest gout.   Electronically Signed   By: Marcello Moores  Register   On: 06/01/2013 08:31   Dg Hand Complete Left  06/01/2013   CLINICAL DATA:  Left hand swelling.  EXAM: LEFT HAND - COMPLETE 3+ VIEW  COMPARISON:  None.  FINDINGS: The bones are osteopenic. No acute fracture or subluxation identified. No radio-opaque foreign body or soft tissue calcification. Calcified atherosclerotic change noted.  IMPRESSION: 1. No acute findings. 2. Osteopenia.   Electronically Signed   By: Kerby Moors M.D.   On: 06/01/2013 17:32     Assessment/Plan: Fever  Recent Shingles  L frontal Gyrus CVA  Mental Status Change  Total days of antibiotics: 4 amp/vanco/ceftriaxone  CSF Cx (-), VZV PCR negative.  TEE is (-) He is to get steroids for gout starting  today.  Continue to query if fever from gout or CVA. Will stop his anbx.  Dr Linus Salmons available if questions over the weekend.          [facility] (pager) [phone] [email or web address] 06/02/2013, 3:23 PM  LOS: 14 days   **Disclaimer: This note may have been dictated with voice recognition software. Similar sounding words can inadvertently be transcribed and this note may contain transcription errors which may not have been corrected upon publication of note.**

## 2013-06-02 NOTE — Progress Notes (Signed)
Speech Language Pathology Treatment: Dysphagia  Patient Details Name: [name] MRN: [id number] DOB: 10/02/1931 Today's Date: 06/02/2013 Time: 2353-6144 SLP Time Calculation (min): 15 min  Assessment / Plan / Recommendation Clinical Impression  Pt more alert today with improved awareness of POs, requiring decreased cueing. Pt mostly relying of liquids for nutrition per family, though he did consume 30% of purees on tray. Pt should continue current diet, but may be a candidate for upgrade early next week. Brother in agreement. Educated him on the importance of fully upright posture.    HPI HPI: Pt is a 80 year old male with history of recent shingles, protate cancer, rectal bleeding. Admitted with fever and AMS of unknown source. CXR has been clear. Second MRI shows 1.5 cm acute infarction affecting a left frontal gyrus at the vertex.    Pertinent Vitals NA  SLP Plan  Continue with current plan of care    Recommendations Diet recommendations: Dysphagia 1 (puree);Thin liquid Liquids provided via: Straw Medication Administration: Whole meds with puree Supervision: Staff to assist with self feeding;Trained caregiver to feed patient Compensations: Slow rate;Small sips/bites;Check for pocketing Postural Changes and/or Swallow Maneuvers: Seated upright 90 degrees              Oral Care Recommendations: Oral care BID Follow up Recommendations: Skilled Nursing facility Plan: Continue with current plan of care    GO    [facility], MA CCC-SLP [place] Nivedita Mirabella 06/02/2013, 4:06 PM

## 2013-06-02 NOTE — Progress Notes (Signed)
*  PRELIMINARY RESULTS* Echocardiogram Echocardiogram Transesophageal has been performed.  [name] 06/02/2013, 12:06 PM

## 2013-06-02 NOTE — Progress Notes (Signed)
PROGRESS NOTE  [name] VHQ:[id number] DOB: 30-Sep-1931 DOA: 05/19/2013 PCP: Leonides Grills, MD   [name] is a 80 y.o. male  With a history of hypertension, hyperlipidemia, left leg DVT, coronary artery disease, history of rectal bleeding, prostate cancer status post radiation, presents emergency department for bright red blood per rectum. Patient states this has been ongoing for approximately 4 days. He had a similar episode in February of 2015 at which point he underwent a sigmoidoscopy which showed bleeding from a dilated capillary, which was due to self disimpaction. Patient was recently admitted on 05/07/2013 weakness and pain control shingles. He is currently taking Valtrex and prednisone. Patient is currently in pain. He only complains of shaking and weakness.  GI was consulted initially. Initially felt for no intervention if hgb remained stable. Pt later became febrile with tmax of 102 with ms changes. Pan cultures were unremarkable. Neurology was consulted who recommended LP. LP was notable for a wbc of 18. Empiric acyclovir was started. ID was consulted with recs for added vanc, rocephin, and amp.   Assessment/Plan:   Fever with decreased mental status (neurology and ID following) -U/A clear, chest x-ray clear, blood cultures negative so far. -Borderline CSF ID consulted and patient currently on empiric Vanco-ampicillin-Rocephin along with acyclovir , will monitor CSF cultures. - ? if this is due to disseminated gout. Have discussed with Dr. Johnnye Sima infectious disease on 06/02/2013, okay for a trial of IV Solu-Medrol which I will commence. Also placed him on colchicine.   Acute CVA  On repeat MRI 1.5 cm acute infarction affecting a left frontal gyrus at the vertex. Placed on ASA, continue on tele, obtain PT, OT, speech input, stable A1c and lipid panel, stable carotid duplex, neurology following. pending TEE,   Lab Results  Component Value Date   HGBA1C 6.4*  06/01/2013    Lab Results  Component Value Date   CHOL 98 06/01/2013   HDL 29* 06/01/2013   LDLCALC 53 06/01/2013   TRIG 82 06/01/2013   CHOLHDL 3.4 06/01/2013    History of gout  Despite normal uric acid levels there is a possibility that this could be disseminated gout, he does have some pain in his left ankle and wrist, left wrist x-ray stable, left ankle has some redness which is acute on chronic per patient and family, I have discussed with Dr. Johnnye Sima in detail on 06/02/2013, okay to give him a trial of IV Solu-Medrol and colchicine which I will commence on 06/02/2013 and monitor closely.     Hypokalemia  Replaced oral and IV, will repeat potassium and magnesium levels   Baseline at home appears to be sitting in chair and pivoting to toilet Continue PTOT. May require placement   Painless hematochezia.   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 S/p sigmoidoscopy show no other source of bleeding about 6 weeks ago.  -started Miralax 2 to 3 times a day - changed to PRN as pt started having loose stools -Hgb 10.5 currently (was 13 on 4/24). Hemodynamically stable. Cont to follow closely -Resumed on home dose Xaralto closely monitor hemoglobin   Shingles  -treated but still painful -trying to minimize narcotics as patient it makes patient lethargic -added lidocaine patch -added amytrptyline -neurontin held   Hypertension  -Continue metoprolol and Lasix    Diabetes mellitus with neuropathy  -Hold glipizide placed on Lantus, continue ISS -Continue gabapentin for neuropathy - decreased dose per below -Last hemoglobin A1c November 2014 6.6  CBG (last 3)   Recent  Labs  06/01/13 1836 06/01/13 2208 06/02/13 0801  GLUCAP 164* 163* 121*     Chronic Anticoagulation - pt with hx of CVA in setting of afib - Pt with documented hx of LLE DVT s/p hip surgery - Pt is now s/p LP - discussed with fluro - Resumed xarelto - observe closely for bleed     Code Status: DNR Family  Communication: family at bedside updated personally on 05/31/2013 , 06/01/2013 Disposition Plan: PT eval- recs SNF but family has 24 hour care givers and want to take home     Consultants:  GI  Neurology  ID  Procedures:  MRI brain 4/28  Repeat MRI 05/31/2013    LP   TEE  Carotid US     HPI/Subjective:  Today,  lying in bed in no discomfort. More awake, no headache, no chest-Abd pain. Oriented x2, does have mild left wrist pain.    Objective:  Intake/Output Summary (Last 24 hours) at 06/02/13 0841 Last data filed at 06/02/13 0649  Gross per 24 hour  Intake 2907.97 ml  Output   1075 ml  Net 1832.97 ml   Filed Weights   05/31/13 0515 06/01/13 0450 06/02/13 0648  Weight: 99 kg (218 lb 4.1 oz) 95.437 kg (210 lb 6.4 oz) 102.876 kg (226 lb 12.8 oz)    Filed Vitals:   06/01/13 0450 06/01/13 1346 06/01/13 2209 06/02/13 0648  BP: 109/61 106/49 108/52 117/68  Pulse: 98 102 98 120  Temp: 98.6 F (37 C) 97.9 F (36.6 C) 97.2 F (36.2 C) 98.9 F (37.2 C)  TempSrc: Axillary Oral Oral Oral  Resp: 20 19 20 18   Height:      Weight: 95.437 kg (210 lb 6.4 oz)   102.876 kg (226 lb 12.8 oz)  SpO2: 100% 96% 98% 96%   Exam:  General: Alert and less drowsy in the bed, in nad Cardiovascular: S1 S2 auscultated, no rubs, murmurs or gallops. Regular rate and rhythm.  Respiratory: Clear to auscultation bilaterally with equal chest rise  Abdomen: Soft, obese, nontender, nondistended, + bowel sounds  Neuro: moves all 4 ext- eyes closed Skin: scarring on right thigh  Data Reviewed:   Basic Metabolic Panel:  Recent Labs Lab 05/26/13 1025 05/29/13 0527 06/01/13 0540 06/02/13 0645  NA 138 136* 138 137  K 3.5* 3.8 3.9 3.0*  CL 100 97 98 94*  CO2 25 27 25 26   GLUCOSE 95 156* 100* 92  BUN 14 21 12 13   CREATININE 0.90 0.99 0.84 0.96  CALCIUM 9.1 9.3 9.5 9.5   Liver Function Tests:  Recent Labs Lab 05/29/13 0527  AST 12  ALT 14  ALKPHOS 57  BILITOT 0.3    PROT 5.4*  ALBUMIN 2.2*    Recent Labs Lab 05/30/13 1724  LIPASE 11  AMYLASE 20   No results found for this basename: AMMONIA,  in the last 168 hours CBC:  Recent Labs Lab 05/26/13 1025 05/29/13 0527 06/01/13 0540 06/02/13 0645  WBC 6.7 10.5 10.6* 11.6*  NEUTROABS 4.8  --   --   --   HGB 11.5* 10.5* 11.6* 12.0*  HCT 33.7* 31.3* 34.7* 36.6*  MCV 102.7* 100.0 101.5* 101.9*  PLT 123* 161 254 229   Cardiac Enzymes: No results found for this basename: CKTOTAL, CKMB, CKMBINDEX, TROPONINI,  in the last 168 hours BNP (last 3 results) No results found for this basename: PROBNP,  in the last 8760 hours CBG:  Recent Labs Lab 06/01/13 0812 06/01/13 1223 06/01/13 1836  06/01/13 2208 06/02/13 0801  GLUCAP 119* 162* 164* 163* 121*    Recent Results (from the past 240 hour(s))  CULTURE, BLOOD (ROUTINE X 2)     Status: None   Collection Time    05/23/13  4:25 PM      Result Value Ref Range Status   Specimen Description BLOOD RIGHT HAND   Final   Special Requests BOTTLES DRAWN AEROBIC ONLY 10CC   Final   Culture  Setup Time     Final   Value: 05/23/2013 22:25     Performed at [facility]   Culture     Final   Value: NO GROWTH 5 DAYS     Performed at [facility]   Report Status 05/29/2013 FINAL   Final  CLOSTRIDIUM DIFFICILE BY PCR     Status: None   Collection Time    05/26/13  4:39 AM      Result Value Ref Range Status   C difficile by pcr NEGATIVE  NEGATIVE Final  CSF CULTURE     Status: None   Collection Time    05/29/13  2:20 PM      Result Value Ref Range Status   Specimen Description CSF   Final   Special Requests NONE   Final   Gram Stain     Final   Value: WBC PRESENT, PREDOMINANTLY MONONUCLEAR     NO ORGANISMS SEEN     CYTOSPIN Performed at [facility]     Performed at [facility]   Culture     Final   Value: NO GROWTH 3 DAYS     Performed at [facility]   Report Status 06/01/2013 FINAL   Final  GRAM STAIN      Status: None   Collection Time    05/29/13  2:20 PM      Result Value Ref Range Status   Specimen Description CSF   Final   Special Requests NONE   Final   Gram Stain     Final   Value: WBC PRESENT, PREDOMINANTLY MONONUCLEAR     NO ORGANISMS SEEN     CYTOSPIN SLIDE   Report Status 05/29/2013 FINAL   Final  URINE CULTURE     Status: None   Collection Time    05/30/13  6:42 PM      Result Value Ref Range Status   Specimen Description URINE, CLEAN CATCH   Final   Special Requests NONE   Final   Culture  Setup Time     Final   Value: 05/30/2013 22:05     Performed at [place]     Final   Value: 40,000 COLONIES/ML     Performed at [facility]   Culture     Final   Value: Multiple bacterial morphotypes present, none predominant. Suggest appropriate recollection if clinically indicated.     Performed at [facility]   Report Status 05/31/2013 FINAL   Final  CULTURE, BLOOD (ROUTINE X 2)     Status: None   Collection Time    05/31/13  8:18 AM      Result Value Ref Range Status   Specimen Description BLOOD LEFT ARM   Final   Special Requests BOTTLES DRAWN AEROBIC AND ANAEROBIC 5CC   Final   Culture  Setup Time     Final   Value: 05/31/2013 12:28     Performed at [facility]  Culture     Final   Value:        BLOOD CULTURE RECEIVED NO GROWTH TO DATE CULTURE WILL BE HELD FOR 5 DAYS BEFORE ISSUING A FINAL NEGATIVE REPORT     Performed at [facility]   Report Status PENDING   Incomplete  CLOSTRIDIUM DIFFICILE BY PCR     Status: None   Collection Time    05/31/13  7:50 PM      Result Value Ref Range Status   C difficile by pcr NEGATIVE  NEGATIVE Final     Studies: Dg Abd 1 View  05/31/2013   CLINICAL DATA:  Ileus  EXAM: ABDOMEN - 1 VIEW  COMPARISON:  DG ABD ACUTE W/CHEST dated 05/19/2013  FINDINGS: No dilated loops of large or small bowel. There is a moderate volume stool in the rectum. No pathologic calcifications.  Degenerate changes of the spine. .  IMPRESSION: No evidence of bowel obstruction.   Electronically Signed   By: Suzy Bouchard M.D.   On: 05/31/2013 18:28   Mr Brain Wo Contrast  05/31/2013   CLINICAL DATA:  Altered mental status.  Confusion.  Agitation.  EXAM: MRI HEAD WITHOUT CONTRAST  TECHNIQUE: Multiplanar, multiecho pulse sequences of the brain and surrounding structures were obtained without intravenous contrast.  COMPARISON:  05/23/2013  FINDINGS: The examination is suffers from motion degradation. There is a 1.5 cm acute infarction affecting the a left frontal gyrus at the vertex. No other acute finding. There are old small vessel infarctions affecting the cerebellum. Chronic small-vessel changes affect the pons in the cerebral hemispheric white matter. No evidence of mass lesion. No hemorrhage. No extra-axial collection.  IMPRESSION: 1.5 cm acute infarction affecting a left frontal gyrus at the vertex. No swelling or gross hemorrhage. This is new since the study of 04/28.  Chronic small-vessel changes elsewhere.   Electronically Signed   By: Nelson Chimes M.D.   On: 05/31/2013 16:38   Dg Shoulder Left Port  06/01/2013   CLINICAL DATA:  Gout.  EXAM: PORTABLE LEFT SHOULDER - 2+ VIEW  COMPARISON:  None.  FINDINGS: There is no evidence of fracture or dislocation. There is no evidence of arthropathy or other focal bone abnormality. Soft tissues are unremarkable.  IMPRESSION: No acute soft tissue bony or abnormality identified. No evidence of fracture or dislocation. No evidence of arthropathy. No evidence to suggest gout.   Electronically Signed   By: Marcello Moores  Register   On: 06/01/2013 08:31   Dg Hand Complete Left  06/01/2013   CLINICAL DATA:  Left hand swelling.  EXAM: LEFT HAND - COMPLETE 3+ VIEW  COMPARISON:  None.  FINDINGS: The bones are osteopenic. No acute fracture or subluxation identified. No radio-opaque foreign body or soft tissue calcification. Calcified atherosclerotic change noted.   IMPRESSION: 1. No acute findings. 2. Osteopenia.   Electronically Signed   By: Kerby Moors M.D.   On: 06/01/2013 17:32    Scheduled Meds:  Anti-infectives   Start     Dose/Rate Route Frequency Ordered Stop   05/31/13 1200  ampicillin (OMNIPEN) 2 g in sodium chloride 0.9 % 50 mL IVPB     2 g 150 mL/hr over 20 Minutes Intravenous 6 times per day 05/31/13 0826     05/31/13 0600  vancomycin (VANCOCIN) IVPB 1000 mg/200 mL premix     1,000 mg 200 mL/hr over 60 Minutes Intravenous Every 12 hours 05/30/13 1639     05/30/13 2200  cefTRIAXone (ROCEPHIN) 2 g in dextrose  5 % 50 mL IVPB     2 g 100 mL/hr over 30 Minutes Intravenous Every 12 hours 05/30/13 1621     05/30/13 1645  vancomycin (VANCOCIN) 2,000 mg in sodium chloride 0.9 % 500 mL IVPB     2,000 mg 250 mL/hr over 120 Minutes Intravenous STAT 05/30/13 1639 05/30/13 2003   05/30/13 1630  ampicillin (OMNIPEN) 1 g in sodium chloride 0.9 % 50 mL IVPB  Status:  Discontinued     1 g 150 mL/hr over 20 Minutes Intravenous 6 times per day 05/30/13 1621 05/31/13 0826   05/30/13 0600  acyclovir (ZOVIRAX) 700 mg in dextrose 5 % 100 mL IVPB  Status:  Discontinued     700 mg 114 mL/hr over 60 Minutes Intravenous Every 8 hours 05/30/13 0217 06/01/13 1337   05/29/13 1800  acyclovir (ZOVIRAX) 965 mg in dextrose 5 % 150 mL IVPB  Status:  Discontinued     10 mg/kg  96.6 kg 169.3 mL/hr over 60 Minutes Intravenous Every 8 hours 05/29/13 1702 05/30/13 0217   05/19/13 2200  valACYclovir (VALTREX) tablet 500 mg  Status:  Discontinued     500 mg Oral 2 times daily 05/19/13 1726 05/20/13 1103     . allopurinol  300 mg Oral q morning - 10a  . amitriptyline  12.5 mg Oral QHS  . ampicillin (OMNIPEN) IV  2 g Intravenous 6 times per day  . aspirin  81 mg Oral Daily  . calcium-vitamin D  1 tablet Oral QPM  . cefTRIAXone (ROCEPHIN)  IV  2 g Intravenous Q12H  . colchicine  0.6 mg Oral BID  . diltiazem  180 mg Oral q morning - 10a  . feeding supplement  (GLUCERNA SHAKE)  237 mL Oral BID PC  . insulin aspart  0-15 Units Subcutaneous TID WC  . insulin glargine  15 Units Subcutaneous Daily  . lidocaine  1 patch Transdermal Q24H  . methylPREDNISolone (SOLU-MEDROL) injection  60 mg Intravenous Q12H  . metoprolol  25 mg Oral BID  . multivitamin with minerals  1 tablet Oral QPM  . polyethylene glycol  17 g Oral BID  . potassium chloride  10 mEq Intravenous Q1 Hr x 4  . potassium chloride  40 mEq Oral Once  . rivaroxaban  20 mg Oral Q supper  . vancomycin  1,000 mg Intravenous Q12H    Time spent: 35 min   06/02/2013, 8:41 AM  LOS: 14 days   Thurnell Lose M.D on 06/02/2013 at 8:41 AM  Between 7am to 7pm - Pager - [phone], After 7pm go to [email or web address] - password [email or web address]  And look for the night coverage person covering me after hours  [facility]  Office  [phone]

## 2013-06-02 NOTE — Progress Notes (Signed)
CRITICAL VALUE ALERT  Critical value received:  Vanc. Trough 25.3  Date of notification:  06/02/13  Time of notification:  0810  Critical value read back:yes  Nurse who received alert:  Oren Bracket  MD notified (1st page):  Dr. Candiss Norse  Time of first page:  0811  MD notified (2nd page):  Time of second page:  Responding MD:  Dr. Candiss Norse  Time MD responded:  [phone]  Instructed to notify pharmacy.  Notified Kendra, pharmacist, said she would address and stopped the vanc. Per Kendra's orders.  Will continue to monitor. Syliva Overman

## 2013-06-02 NOTE — Evaluation (Signed)
Occupational Therapy Evaluation Patient Details Name: [name] MRN: [id number] DOB: Nov 18, 1931 Today's Date: 06/02/2013    History of Present Illness Pt is a 80 year old male with history of recent shingles, protate cancer, rectal bleeding. Admitted with fever and AMS of unknown source. CXR has been clear. Second MRI shows 1.5 cm acute infarction affecting a left frontal gyrus at the vertex.   Clinical Impression   Pt admitted with above. He demonstrates the below listed deficits and will benefit from continued OT to maximize safety and independence with BADLs.  Pt lethargic throughout eval, arouses briefly to follow simple command.  Currently, he is total A with all BADLs.  Pt's brother present and states pt and wife have 24 hour hired caregivers; however, upon further questioning, he reports pt MUST be able to stand and take 3 steps to [facility] in order to discharge home.  He seems to think that pt will be significantly improved at time of discharge.  Did talk with him about the possibility of pt requiring more assistance than this, and he then concedes that pt may need SNF for rehab at discharge.      Follow Up Recommendations  SNF    Equipment Recommendations  None recommended by OT    Recommendations for Other Services       Precautions / Restrictions Precautions Precautions: Fall      Mobility Bed Mobility Overal bed mobility: Needs Assistance Bed Mobility: Rolling Rolling: Total assist            Transfers                      Balance                                            ADL Overall ADL's : Needs assistance/impaired Eating/Feeding: Maximal assistance;Bed level   Grooming: Maximal assistance;Wash/dry hands;Wash/dry face;Bed level   Upper Body Bathing: Total assistance;Bed level   Lower Body Bathing: Total assistance;+2 for physical assistance;Bed level   Upper Body Dressing : Total assistance;Bed level   Lower Body  Dressing: Total assistance;+2 for physical assistance;Bed level   Toilet Transfer: Total assistance (unable)   Toileting- Clothing Manipulation and Hygiene: Total assistance;+2 for physical assistance;Bed level       Functional mobility during ADLs: Total assistance;+2 for physical assistance General ADL Comments: limited eval due to lethargy      Vision                 Additional Comments: Pt maintains eyes closed - opens them briefly    Perception     Praxis      Pertinent Vitals/Pain Pt grimaces with all attempts to move bil. UEs Lt. > Rt. Pt repositioned      Hand Dominance Right   Extremity/Trunk Assessment Upper Extremity Assessment Upper Extremity Assessment: RUE deficits/detail;LUE deficits/detail RUE Deficits / Details: Pt with mild edema rt. UE.  PROM WFL.  Spontaneous movement noted, but pt unable to maintain arrousal to fully asses  LUE Deficits / Details: moderate edema throughout. Pt indicates pain.  Digits tight in flexion, but able to achieve full PROM passively.  Pt does demonstrate full shoulder flexion actively, but unable to maintain arrousal to assess further    Lower Extremity Assessment Lower Extremity Assessment: Defer to PT evaluation   Cervical / Trunk Assessment Cervical /  Trunk Assessment: Kyphotic   Communication Communication Communication: HOH   Cognition Arousal/Alertness: Lethargic Behavior During Therapy: Flat affect Overall Cognitive Status: Impaired/Different from baseline Area of Impairment: Attention   Current Attention Level: Focused           General Comments: Pt lethargic and rouses only minimally despite max attempts.  He did follow one one step motor command    General Comments       Exercises       Shoulder Instructions      Home Living Family/patient expects to be discharged to:: Private residence Living Arrangements: Spouse/significant other Available Help at Discharge: Family;Personal care  attendant;Available 24 hours/day Type of Home: House Home Access: Ramped entrance     Home Layout: One level     Bathroom Shower/Tub: Tub/shower unit Shower/tub characteristics: Curtain Biochemist, clinical: Standard     Home Equipment: Environmental consultant - 2 wheels;Cane - single point;Bedside commode;Wheelchair - manual;Tub bench   Additional Comments: Brother reports that pt and wife have 24 hour hired caregiver through [place] and that pt needs to be able to stand and take 3 steps in order to discharge home.  Otherwise will need SNF for rehab, per brother reports that pt and wife have 24 hour hired caregiver through [place]      Prior Functioning/Environment Level of Independence: Needs assistance  Gait / Transfers Assistance Needed: pt ambulates short distances with a walker, has a hx of falls...able to transfer independently ADL's / Homemaking Assistance Needed: assist with bathing and dressing   Comments: pt and wife have multiple people assisting in their care to cover 24 hours/day    OT Diagnosis: Generalized weakness;Acute pain;Cognitive deficits   OT Problem List: Decreased strength;Decreased range of motion;Decreased activity tolerance;Impaired balance (sitting and/or standing);Decreased knowledge of use of DME or AE;Obesity;Impaired UE functional use;Pain;Decreased cognition;Decreased coordination   OT Treatment/Interventions: Self-care/ADL training;Therapeutic exercise;DME and/or AE instruction;Therapeutic activities;Patient/family education;Balance training    OT Goals(Current goals can be found in the care plan section) Acute Rehab OT Goals Patient Stated Goal: per brother to go home OT Goal Formulation: With family Time For Goal Achievement: 06/16/13 Potential to Achieve Goals: Fair ADL Goals Pt Will Perform Grooming: with min assist;sitting Pt Will Transfer to Toilet: with mod assist;with +2 assist;bedside commode;stand pivot transfer Additional ADL Goal #1: Pt will participate  in 25 mins therapeutic activity to increase activity tolerance/endurance Additional ADL Goal #2: Pt will be able to sit EOB with min A while engaging in simple self care task x 15 mins   OT Frequency: Min 2X/week   Barriers to D/C: Decreased caregiver support  unsure that he has adequate amount of assist to discharge home        Co-evaluation              End of Session    Activity Tolerance: Patient limited by lethargy Patient left: in bed;with call bell/phone within reach;with bed alarm set;with family/visitor present   Time: 4401-0272 OT Time Calculation (min): 20 min Charges:  OT General Charges $OT Visit: 1 Procedure OT Evaluation $Initial OT Evaluation Tier I: 1 Procedure G-Codes:    Walker Kehr 06/08/13, 6:12 PM

## 2013-06-02 NOTE — Op Note (Signed)
INDICATIONS: infective endocarditis  PROCEDURE:   Informed consent was obtained prior to the procedure. The risks, benefits and alternatives for the procedure were discussed and the patient comprehended these risks.  Risks include, but are not limited to, cough, sore throat, vomiting, nausea, somnolence, esophageal and stomach trauma or perforation, bleeding, low blood pressure, aspiration, pneumonia, infection, trauma to the teeth and death.    After a procedural time-out, the oropharynx was anesthetized with 20% benzocaine spray. The patient was given 2 mg versed and 50 mcg fentanyl for moderate sedation.   The transesophageal probe was inserted in the esophagus and stomach without difficulty and multiple views were obtained.  The patient was kept under observation until the patient left the procedure room.  The patient left the procedure room in stable condition.   Agitated microbubble saline contrast was not administered.  COMPLICATIONS:    There were no immediate complications.  FINDINGS:  No vegetations seen. No intracardiac thrombus.  Normal LVEF.  Moderate aortic atherosclerosis. Atrial fibrillation during the study. Dilated LA, no clot.  RECOMMENDATIONS:     No cardiac source of embolism or signs of endocarditis.  Time Spent Directly with the Patient:  30 minutes   Shin Lamour 06/02/2013, 11:33 AM

## 2013-06-02 NOTE — Progress Notes (Signed)
Stroke Team Progress Note  HISTORY [name] is an 80 y.o. male who was diagnosed with Herpes Zoster 4 weeks ago. He is currently taking Valtrex and prednisone. Patient was admitted for BRBPR and over the hospital stay he has had a mental decline. Getting a good history from family was difficult. What I could glean is that patient does not live alone, cannot care for himself at baseline, cannot cook and does not walk. Per brother, he is able to hold a fairly normal conversation. Currently patient is confused and unable to answer my questions. He is agitated when aroused and tends to hold his eyes shut. While in hospital he has not had a temperature but today had a temperature 102. WBC 8.2    SUBJECTIVE The patient's sister was at the bedside. The patient voices no complaints.  OBJECTIVE Most recent Vital Signs: Filed Vitals:   06/01/13 0450 06/01/13 1346 06/01/13 2209 06/02/13 0648  BP: 109/61 106/49 108/52 117/68  Pulse: 98 102 98 120  Temp: 98.6 F (37 C) 97.9 F (36.6 C) 97.2 F (36.2 C) 98.9 F (37.2 C)  TempSrc: Axillary Oral Oral Oral  Resp: 20 19 20 18   Height:      Weight: 210 lb 6.4 oz (95.437 kg)   226 lb 12.8 oz (102.876 kg)  SpO2: 100% 96% 98% 96%   CBG (last 3)   Recent Labs  06/01/13 1836 06/01/13 2208 06/02/13 0801  GLUCAP 164* 163* 121*    IV Fluid Intake:     MEDICATIONS  . allopurinol  300 mg Oral q morning - 10a  . amitriptyline  12.5 mg Oral QHS  . ampicillin (OMNIPEN) IV  2 g Intravenous 6 times per day  . aspirin  81 mg Oral Daily  . atorvastatin  20 mg Oral q1800  . calcium-vitamin D  1 tablet Oral QPM  . cefTRIAXone (ROCEPHIN)  IV  2 g Intravenous Q12H  . colchicine  0.6 mg Oral BID  . diltiazem  180 mg Oral q morning - 10a  . feeding supplement (GLUCERNA SHAKE)  237 mL Oral BID PC  . insulin aspart  0-15 Units Subcutaneous TID WC  . insulin aspart  4 Units Subcutaneous TID WC  . insulin glargine  15 Units Subcutaneous Daily  .  lidocaine  1 patch Transdermal Q24H  . methylPREDNISolone (SOLU-MEDROL) injection  60 mg Intravenous Q12H  . metoprolol  25 mg Oral BID  . multivitamin with minerals  1 tablet Oral QPM  . polyethylene glycol  17 g Oral BID  . potassium chloride  10 mEq Intravenous Q1 Hr x 4  . potassium chloride  40 mEq Oral Once  . rivaroxaban  20 mg Oral Q supper  . vancomycin  1,000 mg Intravenous Q12H   PRN:  acetaminophen, HYDROcodone-acetaminophen, nitroGLYCERIN, predniSONE  Diet:  Dysphagia 1 with thin liquids Activity:  Up with assistance DVT Prophylaxis:  SCDs  CLINICALLY SIGNIFICANT STUDIES Basic Metabolic Panel:  Recent Labs Lab 06/01/13 0540 06/02/13 0645  NA 138 137  K 3.9 3.0*  CL 98 94*  CO2 25 26  GLUCOSE 100* 92  BUN 12 13  CREATININE 0.84 0.96  CALCIUM 9.5 9.5   Liver Function Tests:  Recent Labs Lab 05/29/13 0527  AST 12  ALT 14  ALKPHOS 57  BILITOT 0.3  PROT 5.4*  ALBUMIN 2.2*   CBC:  Recent Labs Lab 05/26/13 1025  06/01/13 0540 06/02/13 0645  WBC 6.7  < > 10.6* 11.6*  NEUTROABS  4.8  --   --   --   HGB 11.5*  < > 11.6* 12.0*  HCT 33.7*  < > 34.7* 36.6*  MCV 102.7*  < > 101.5* 101.9*  PLT 123*  < > 254 229  < > = values in this interval not displayed. Coagulation: No results found for this basename: LABPROT, INR,  in the last 168 hours Cardiac Enzymes: No results found for this basename: CKTOTAL, CKMB, CKMBINDEX, TROPONINI,  in the last 168 hours Urinalysis:  Recent Labs Lab 05/30/13 [place] 1.011  PHURINE 7.5  GLUCOSEU NEGATIVE  HGBUR NEGATIVE  BILIRUBINUR NEGATIVE  KETONESUR NEGATIVE  PROTEINUR NEGATIVE  UROBILINOGEN 0.2  NITRITE NEGATIVE  LEUKOCYTESUR NEGATIVE   Lipid Panel    Component Value Date/Time   CHOL 98 06/01/2013 0940   TRIG 82 06/01/2013 0940   HDL 29* 06/01/2013 0940   CHOLHDL 3.4 06/01/2013 0940   VLDL 16 06/01/2013 0940   LDLCALC 53 06/01/2013 0940   HgbA1C  Lab Results  Component Value Date   HGBA1C  6.4* 06/01/2013    Urine Drug Screen:     Component Value Date/Time   LABOPIA NONE DETECTED 11/27/2012 1943   COCAINSCRNUR NONE DETECTED 11/27/2012 1943   LABBENZ NONE DETECTED 11/27/2012 1943   AMPHETMU NONE DETECTED 11/27/2012 1943   THCU NONE DETECTED 11/27/2012 1943   LABBARB NONE DETECTED 11/27/2012 1943    Alcohol Level: No results found for this basename: ETH,  in the last 168 hours  Dg Abd 1 View 05/31/2013    No evidence of bowel obstruction.     Mr Brain Wo Contrast 05/31/2013    1.5 cm acute infarction affecting a left frontal gyrus at the vertex. No swelling or gross hemorrhage. This is new since the study of 04/28.  Chronic small-vessel changes elsewhere.       Dg Shoulder Left Port 06/01/2013    No acute soft tissue bony or abnormality identified. No evidence of fracture or dislocation. No evidence of arthropathy. No evidence to suggest gout.   Dg Hand Complete Left 06/01/2013    1. No acute findings. 2. Osteopenia.     MRA of the brain    2D Echocardiogram  Pending  TEE - No cardiac source of embolism or signs of endocarditis.  Carotid Doppler  Findings consistent with 1-39 percent stenosis involving the right internal carotid artery and the left internal carotid artery.   CXR    EKG pending  For complete results please see formal report.   Therapy Recommendations SNF  Physical Exam   Mental Status:  Alert, not oriented to time, place, date, thought content not appropriate. Holds his eyes shut. Speech fluent without evidence of aphasia. Not able to follow 3 step commands.  Cranial Nerves:  II: Discs flat bilaterally; blinks to threat bilaterally, pupils equal, round, reactive to light and accommodation  III,IV, VI: will not open eyes but shows good strength forcing his eyes shut, eyes disconjugate when opened, right eye did not deviate fully laterally and left eye did not deviate fully medially.  V,VII: smile symmetric, facial light touch sensation normal  bilaterally  VIII: hearing normal bilaterally  IX,X: gag reflex present  XI: bilateral shoulder shrug  XII: midline tongue extension without atrophy or fasciculations  Motor: MOTOR IMPERSISTENCE. RIGHT ARM DRIFT WITH EYES CLOSED. Right :  Upper extremity 3/5   Left:  Upper extremity 3/5   Lower extremity 3/5    Lower extremity 3/5  Tone  and bulk:normal tone throughout; no atrophy noted  Sensory: Pinprick and light touch intact throughout, bilaterally  Deep Tendon Reflexes:  Right: Upper Extremity Left: Upper extremity  biceps (C-5 to C-6) 2/4 biceps (C-5 to C-6) 2/4  tricep (C7) 2/4 triceps (C7) 2/4  Brachioradialis (C6) 2/4 Brachioradialis (C6) 2/4  Lower Extremity Lower Extremity  quadriceps (L-2 to L-4) 1/4 quadriceps (L-2 to L-4) 1/4  Achilles (S1) 0/4 Achilles (S1) 0/4  Plantars:  Mute bilaterally  Cerebellar:  Would not take part in testing  Gait: not able to assess due to mental status   ASSESSMENT [name] is a 80 y.o. male presenting with altered now status. TPA was not administered as it was not clear that the patient had a stroke at the time of onset was unknown. MRI -  1.5 cm acute infarction affecting a left frontal gyrus at the vertex. The infarct was felt to be thrombotic secondary to small vessel disease. On no antithrombotics prior to admission. Now on aspirin 81 mg orally every day for secondary stroke prevention. Patient with resultant altered mental status. Stroke work up underway.   Hyperlipidemia - Zocor prior to admission - cholesterol 98 LDL 53  Hypertension history  Left lower extremity DVT  Coronary artery disease  Previous stroke in 2012  Paroxysmal atrial fibrillation  Coronary artery disease  Diabetes mellitus - hemoglobin A1c 6.4  Hypokalemia   Hospital day # 14  TREATMENT/PLAN  Continue aspirin 81 mg orally every day for secondary stroke prevention.   Mikey Bussing PA-C [facility] Pager [phone] 06/02/2013, 9:36 AM  I evaluated and examined patient, reviewed records, labs and imaging, and agree with note and plan. Left frontal brain abnormality on MRI likely represents acute ischemic infarct, and also not likely related to patient's underlying encephalopathy. He may have slight right arm drift that could localize to the new stroke.   In setting of fever, confusion, and CSF pleocytosis, if this MRI/DWI abnormality could represent focal abscess or other infection, then it may be symptomatic. This MRI brain study was limited by patient motion and intolerance of exam, and therefore only limited study was performed. Consider repeat MRI brain (with and without) when patient is stable.    Penni Bombard, MD 09/27/1192, 1:74 PM Certified in Neurology, Neurophysiology and Neuroimaging [facility] - Stroke Team  Please refer to [email or web address] for on-call Stroke MD   To contact Stroke Continuity provider, please refer to [email or web address] After hours, contact General Neurology

## 2013-06-02 NOTE — Interval H&P Note (Signed)
History and Physical Interval Note:  06/02/2013 8:36 AM  [name]  has presented today for surgery, with the diagnosis of stroke  The various methods of treatment have been discussed with the patient and family. After consideration of risks, benefits and other options for treatment, the patient has consented to  Procedure(s): TRANSESOPHAGEAL ECHOCARDIOGRAM (TEE) (N/A) as a surgical intervention .  The patient's history has been reviewed, patient examined, no change in status, stable for surgery.  I have reviewed the patient's chart and labs.  Questions were answered to the patient's satisfaction.     [name]

## 2013-06-03 LAB — CBC
HEMATOCRIT: 31.5 % — AB (ref 39.0–52.0)
Hemoglobin: 10.5 g/dL — ABNORMAL LOW (ref 13.0–17.0)
MCH: 33.3 pg (ref 26.0–34.0)
MCHC: 33.3 g/dL (ref 30.0–36.0)
MCV: 100 fL (ref 78.0–100.0)
Platelets: 220 10*3/uL (ref 150–400)
RBC: 3.15 MIL/uL — AB (ref 4.22–5.81)
RDW: 16.2 % — AB (ref 11.5–15.5)
WBC: 11.2 10*3/uL — ABNORMAL HIGH (ref 4.0–10.5)

## 2013-06-03 LAB — BASIC METABOLIC PANEL
BUN: 19 mg/dL (ref 6–23)
CHLORIDE: 96 meq/L (ref 96–112)
CO2: 28 meq/L (ref 19–32)
CREATININE: 0.82 mg/dL (ref 0.50–1.35)
Calcium: 9.4 mg/dL (ref 8.4–10.5)
GFR calc Af Amer: >90 mL/min (ref >90)
GFR calc non Af Amer: 82 mL/min — ABNORMAL LOW (ref >90)
Glucose, Bld: 191 mg/dL — ABNORMAL HIGH (ref 70–99)
POTASSIUM: 3.6 meq/L — AB (ref 3.7–5.3)
Sodium: 134 mEq/L — ABNORMAL LOW (ref 137–147)

## 2013-06-03 LAB — GLUCOSE, CAPILLARY
GLUCOSE-CAPILLARY: 184 mg/dL — AB (ref 70–99)
Glucose-Capillary: 236 mg/dL — ABNORMAL HIGH (ref 70–99)
Glucose-Capillary: 278 mg/dL — ABNORMAL HIGH (ref 70–99)
Glucose-Capillary: 250 mg/dL — ABNORMAL HIGH (ref 70–99)

## 2013-06-03 LAB — MAGNESIUM: MAGNESIUM: 2 mg/dL (ref 1.5–2.5)

## 2013-06-03 MED ORDER — POTASSIUM CHLORIDE CRYS ER 20 MEQ PO TBCR
40.0000 meq | EXTENDED_RELEASE_TABLET | ORAL | Status: AC
  Administered 2013-06-03 (×2): 40 meq via ORAL
  Filled 2013-06-03: qty 2

## 2013-06-03 MED ORDER — INSULIN GLARGINE 100 UNIT/ML ~~LOC~~ SOLN
15.0000 [IU] | Freq: Two times a day (BID) | SUBCUTANEOUS | Status: DC
  Administered 2013-06-03 – 2013-06-06 (×6): 15 [IU] via SUBCUTANEOUS
  Filled 2013-06-03 (×8): qty 0.15

## 2013-06-03 NOTE — Progress Notes (Signed)
Stroke Team Progress Note  HISTORY [name] is an 80 y.o. [name] who was diagnosed with Herpes Zoster 4 weeks ago. He is currently taking Valtrex and prednisone. Patient was admitted for BRBPR and over the hospital stay he has had a mental decline. Getting a good history from family was difficult. What I could glean is that patient does not live alone, cannot care for himself at baseline, cannot cook and does not walk. Per brother, he is able to hold a fairly normal conversation. Currently patient is confused and unable to answer my questions. He is agitated when aroused and tends to hold his eyes shut. While in hospital he has not had a temperature but today had a temperature 102. WBC 8.2    SUBJECTIVE   The patient reports that he is feeling well. He has no complaints.  OBJECTIVE Most recent Vital Signs: Filed Vitals:   06/02/13 1215 06/02/13 1350 06/02/13 2054 06/03/13 0526  BP:  94/57 104/66 102/68  Pulse:  90 88 86  Temp: 99.4 F (37.4 C) 98.1 F (36.7 C) 98.3 F (36.8 C) 98.4 F (36.9 C)  TempSrc: Oral Oral Oral Oral  Resp:  20 18 17   Height:      Weight:    222 lb 6.4 oz (100.88 kg)  SpO2: 94% 94% 95% 97%   CBG (last 3)   Recent Labs  06/02/13 1709 06/02/13 1717 06/02/13 2052  GLUCAP 463* 403* 341*    IV Fluid Intake:     MEDICATIONS  . allopurinol  300 mg Oral q morning - 10a  . amitriptyline  12.5 mg Oral QHS  . aspirin  81 mg Oral Daily  . atorvastatin  20 mg Oral q1800  . calcium-vitamin D  1 tablet Oral QPM  . colchicine  0.6 mg Oral BID  . diltiazem  180 mg Oral q morning - 10a  . feeding supplement (GLUCERNA SHAKE)  237 mL Oral BID PC  . insulin aspart  0-15 Units Subcutaneous TID WC  . insulin aspart  0-5 Units Subcutaneous QHS  . insulin aspart  4 Units Subcutaneous TID WC  . insulin glargine  15 Units Subcutaneous Daily  . lidocaine  1 patch Transdermal Q24H  . methylPREDNISolone (SOLU-MEDROL) injection  60 mg Intravenous Daily  . metoprolol   25 mg Oral BID  . multivitamin with minerals  1 tablet Oral QPM  . polyethylene glycol  17 g Oral BID  . rivaroxaban  20 mg Oral Q supper   PRN:  acetaminophen, HYDROcodone-acetaminophen, nitroGLYCERIN, predniSONE  Diet:  Dysphagia 1 with thin liquids Activity:  Up with assistance DVT Prophylaxis:  SCDs  CLINICALLY SIGNIFICANT STUDIES Basic Metabolic Panel:   Recent Labs Lab 06/02/13 0645 06/03/13 0630  NA 137 134*  K 3.0* 3.6*  CL 94* 96  CO2 26 28  GLUCOSE 92 191*  BUN 13 19  CREATININE 0.96 0.82  CALCIUM 9.5 9.4  MG  --  2.0   Liver Function Tests:   Recent Labs Lab 05/29/13 0527  AST 12  ALT 14  ALKPHOS 57  BILITOT 0.3  PROT 5.4*  ALBUMIN 2.2*   CBC:   Recent Labs Lab 06/02/13 0645 06/03/13 0630  WBC 11.6* 11.2*  HGB 12.0* 10.5*  HCT 36.6* 31.5*  MCV 101.9* 100.0  PLT 229 220   Coagulation: No results found for this basename: LABPROT, INR,  in the last 168 hours Cardiac Enzymes: No results found for this basename: CKTOTAL, CKMB, CKMBINDEX, TROPONINI,  in  the last 168 hours Urinalysis:   Recent Labs Lab 05/30/13 1842  COLORURINE YELLOW  LABSPEC 1.011  PHURINE 7.5  GLUCOSEU NEGATIVE  HGBUR NEGATIVE  BILIRUBINUR NEGATIVE  KETONESUR NEGATIVE  PROTEINUR NEGATIVE  UROBILINOGEN 0.2  NITRITE NEGATIVE  LEUKOCYTESUR NEGATIVE   Lipid Panel    Component Value Date/Time   CHOL 98 06/01/2013 0940   TRIG 82 06/01/2013 0940   HDL 29* 06/01/2013 0940   CHOLHDL 3.4 06/01/2013 0940   VLDL 16 06/01/2013 0940   LDLCALC 53 06/01/2013 0940   HgbA1C  Lab Results  Component Value Date   HGBA1C 6.4* 06/01/2013    Urine Drug Screen:     Component Value Date/Time   LABOPIA NONE DETECTED 11/27/2012 1943   COCAINSCRNUR NONE DETECTED 11/27/2012 1943   LABBENZ NONE DETECTED 11/27/2012 1943   AMPHETMU NONE DETECTED 11/27/2012 1943   THCU NONE DETECTED 11/27/2012 1943   LABBARB NONE DETECTED 11/27/2012 1943    Alcohol Level: No results found for this basename: ETH,  in  the last 168 hours  Dg Abd 1 View 05/31/2013    No evidence of bowel obstruction.     Mr Brain Wo Contrast 05/31/2013    1.5 cm acute infarction affecting a left frontal gyrus at the vertex. No swelling or gross hemorrhage. This is new since the study of 04/28.  Chronic small-vessel changes elsewhere.       Dg Shoulder Left Port 06/01/2013    No acute soft tissue bony or abnormality identified. No evidence of fracture or dislocation. No evidence of arthropathy. No evidence to suggest gout.   Dg Hand Complete Left 06/01/2013    1. No acute findings. 2. Osteopenia.     MRA of the brain    2D Echocardiogram  see TEE  TEE - No cardiac source of embolism or signs of endocarditis.  Carotid Doppler  Findings consistent with 1-39 percent stenosis involving the right internal carotid artery and the left internal carotid artery.   CXR    EKG pending  For complete results please see formal report.   Therapy Recommendations SNF  Physical Exam   Mental Status:  Alert, not oriented to time, place, date, thought content not appropriate. Holds his eyes shut. Speech fluent without evidence of aphasia. Not able to follow 3 step commands.  Cranial Nerves:  II: blinks to threat bilaterally, pupils equal, round, reactive to light and accommodation  III,IV, VI: will not open eyes but shows good strength forcing his eyes shut, eyes disconjugate when opened, right eye did not deviate fully laterally and left eye did not deviate fully medially.  V,VII: smile symmetric, facial light touch sensation normal bilaterally  VIII: hearing normal bilaterally  IX,X: gag reflex present  XI: bilateral shoulder shrug  XII: midline tongue extension without atrophy or fasciculations  Motor: MOTOR IMPERSISTENCE. RIGHT ARM DRIFT WITH EYES CLOSED. Right :  Upper extremity 4-/5   Left:  Upper extremity 4-/5   Lower extremity 5/5    Lower extremity 5/5  Tone and bulk:normal tone throughout; no atrophy noted   Sensory: Pinprick and light touch intact throughout, bilaterally  Deep Tendon Reflexes:  Right: Upper Extremity Left: Upper extremity  biceps (C-5 to C-6) 2/4 biceps (C-5 to C-6) 2/4  tricep (C7) 2/4 triceps (C7) 2/4  Brachioradialis (C6) 2/4 Brachioradialis (C6) 2/4  Lower Extremity Lower Extremity  quadriceps (L-2 to L-4) 1/4 quadriceps (L-2 to L-4) 1/4  Achilles (S1) 0/4 Achilles (S1) 0/4  Plantars:  Mute bilaterally  Cerebellar:  Would not take part in testing  Gait: not able to assess due to mental status   ASSESSMENT [name] is a 80 y.o. [name] presenting with altered now status. TPA was not administered as it was not clear that the patient had a stroke at the time of onset was unknown. MRI -  1.5 cm acute infarction affecting a left frontal gyrus at the vertex. The infarct was felt to be thrombotic secondary to small vessel disease. On no antithrombotics prior to admission. Now on aspirin 81 mg orally every day for secondary stroke prevention. Patient with resultant altered mental status. Stroke work up underway.   Hyperlipidemia - Zocor prior to admission - cholesterol 98 LDL 53  Hypertension history  Left lower extremity DVT  Coronary artery disease  Previous stroke in 2012  Paroxysmal atrial fibrillation.  Coronary artery disease  Diabetes mellitus - hemoglobin A1c 6.4  Hypokalemia   Hospital day # 15  TREATMENT/PLAN  Continue aspirin 81 mg orally every day for secondary stroke prevention. I would recommend only doing this for 2-4 weeks as the patient already is on Xarelto anticoagulation due to atrial fibrillation.  Consider repeat MRI - See note below   Mikey Bussing PA-C [facility] Pager [phone] 06/03/2013, 8:27 AM  I evaluated and examined patient, reviewed records, labs and imaging, and agree with note and plan. Left frontal brain abnormality on MRI likely represents acute ischemic infarct, and also not likely related  to patient's underlying encephalopathy. He may have slight right arm drift that could localize to the new stroke.   In setting of fever, confusion, and CSF pleocytosis, if this MRI/DWI abnormality could represent focal abscess or other infection, then it may be symptomatic. This MRI brain study was limited by patient motion and intolerance of exam, and therefore only limited study was performed. Consider repeat MRI brain (with and without) when patient is stable.      Please refer to [email or web address] for on-call Stroke MD   To contact Stroke Continuity provider, please refer to [email or web address] After hours, contact General Neurology

## 2013-06-03 NOTE — Progress Notes (Addendum)
PROGRESS NOTE  [name] [id number] DOB: 1931/04/03 DOA: 05/19/2013 PCP: Leonides Grills, MD   [name] [name] is a 80 y.o. male  With a history of hypertension, hyperlipidemia, left leg DVT, coronary artery disease, history of rectal bleeding, prostate cancer status post radiation, presents emergency department for bright red blood per rectum. Patient states this has been ongoing for approximately 4 days. He had a similar episode in February of 2015 at which point he underwent a sigmoidoscopy which showed bleeding from a dilated capillary, which was due to self disimpaction. Patient was recently admitted on 05/07/2013 weakness and pain control shingles. He is currently taking Valtrex and prednisone. Patient is currently in pain. He only complains of shaking and weakness.  GI was consulted initially. Initially felt for no intervention if hgb remained stable. Pt later became febrile with tmax of 102 with ms changes. Pan cultures were unremarkable. Neurology was consulted who recommended LP. LP was notable for a wbc of 18. Empiric acyclovir was started. ID was consulted with recs for added vanc, rocephin, and amp.   Assessment/Plan:   Fever with decreased mental status (neurology and ID following) -U/A clear, chest x-ray clear, blood cultures negative so far. -Borderline CSF ID consulted and patient currently was on empiric Vanco-ampicillin-Rocephin along with acyclovir stopped by id 06-02-13 , will monitor CSF cultures.  - ? if this is due to disseminated gout. Have discussed with Dr. Johnnye Sima infectious disease on 06/02/2013, okay for a trial of IV Solu-Medrol which was started on 5A 20/15 with good effect, he is remarkably improved after 1 dose, we'll continue IV Solu-Medrol along with recently started colchicine.   Acute CVA  On repeat MRI 1.5 cm acute infarction affecting a left frontal gyrus at the vertex. Placed on ASA, continue on tele, obtain PT, OT, speech input, stable A1c and  lipid panel, stable carotid duplex, neurology following. stable TEE,   Lab Results  Component Value Date   HGBA1C 6.4* 06/01/2013    Lab Results  Component Value Date   CHOL 98 06/01/2013   HDL 29* 06/01/2013   LDLCALC 53 06/01/2013   TRIG 82 06/01/2013   CHOLHDL 3.4 06/01/2013    History of gout  Despite normal uric acid levels there is a possibility that this could be disseminated gout, he does have some pain in his left ankle and wrist, left wrist x-ray stable, left ankle has some redness which is acute on chronic per patient and family, now improving on IV Solu-Medrol and colchicine started on 06/02/2013     Hypokalemia  Replaced oral x 2   Baseline at home appears to be sitting in chair and pivoting to toilet Continue PTOT. May require placement   Painless hematochezia.   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 S/p sigmoidoscopy show no other source of bleeding about 6 weeks ago.  -started Miralax 2 to 3 times a day - changed to PRN as pt started having loose stools -Hgb 10.5 currently (was 13 on 4/24). Hemodynamically stable. Cont to follow closely -Resumed on home dose Xaralto closely monitor hemoglobin   Shingles  -treated but still painful -trying to minimize narcotics as patient it makes patient lethargic -added lidocaine patch -added amytrptyline -neurontin held   Hypertension  -Continue metoprolol and Lasix    Diabetes mellitus with neuropathy  -Hold glipizide placed on Lantus, continue ISS -Continue gabapentin for neuropathy - decreased dose per below -Last hemoglobin A1c November 2014 6.6  CBG (last 3)   Recent Labs  06/02/13 1717  06/02/13 2052 06/03/13 0821  GLUCAP 403* 341* 184*     Chronic Anticoagulation - pt with hx of CVA in setting of afib - Pt with documented hx of LLE DVT s/p hip surgery - Pt is now s/p LP - discussed with fluro - Resumed xarelto - observe closely for bleed     Code Status: DNR Family Communication: family at bedside  updated personally on 05/31/2013 , 06/01/2013 Disposition Plan: PT eval- recs SNF but family has 24 hour care givers and want to take home     Consultants:  GI  Neurology  ID  Procedures:  MRI brain 4/28  Repeat MRI 05/31/2013    LP   TEE  Carotid US   TEE  - Left ventricle: The cavity size was normal. Wall thickness was normal. Systolic function was normal. The estimated ejection fraction was in the range of 55% to 60%. - Aortic valve: No evidence of vegetation. No evidence of vegetation. - Mitral valve: No evidence of vegetation. - Left atrium: The atrium was dilated. No evidence of thrombus in the atrial cavity or appendage. No spontaneous echo contrast was observed. - Right atrium: No evidence of thrombus in the atrial cavity or appendage. - Atrial septum: No defect or patent foramen ovale was identified. Echo contrast study showed no right-to-left atrial level shunt, following an increase in RA pressure induced by provocative maneuvers. - Tricuspid valve: No evidence of vegetation. - Pulmonic valve: No evidence of vegetation.      HPI/Subjective:  Today,  lying in bed in no discomfort. More awake and feels better, no headache, no chest-Abd pain. Oriented x3, does have mild left wrist pain.    Objective:  Intake/Output Summary (Last 24 hours) at 06/03/13 0928 Last data filed at 06/03/13 0527  Gross per 24 hour  Intake    700 ml  Output    825 ml  Net   -125 ml   Filed Weights   06/01/13 0450 06/02/13 0648 06/03/13 0526  Weight: 95.437 kg (210 lb 6.4 oz) 102.876 kg (226 lb 12.8 oz) 100.88 kg (222 lb 6.4 oz)    Filed Vitals:   06/02/13 1215 06/02/13 1350 06/02/13 2054 06/03/13 0526  BP:  94/57 104/66 102/68  Pulse:  90 88 86  Temp: 99.4 F (37.4 C) 98.1 F (36.7 C) 98.3 F (36.8 C) 98.4 F (36.9 C)  TempSrc: Oral Oral Oral Oral  Resp:  20 18 17   Height:      Weight:    100.88 kg (222 lb 6.4 oz)  SpO2: 94% 94% 95% 97%    Exam:  General: Alert and less drowsy in the bed, he looks and feels much better and is in no distress Cardiovascular: S1 S2 auscultated, no rubs, murmurs or gallops. Regular rate and rhythm.  Respiratory: Clear to auscultation bilaterally with equal chest rise  Abdomen: Soft, obese, nontender, nondistended, + bowel sounds  Neuro: moves all 4 ext- eyes closed Skin: scarring on right thigh  Data Reviewed:   Basic Metabolic Panel:  Recent Labs Lab 05/29/13 0527 06/01/13 0540 06/02/13 0645 06/03/13 0630  NA 136* 138 137 134*  K 3.8 3.9 3.0* 3.6*  CL 97 98 94* 96  CO2 27 25 26 28   GLUCOSE 156* 100* 92 191*  BUN 21 12 13 19   CREATININE 0.99 0.84 0.96 0.82  CALCIUM 9.3 9.5 9.5 9.4  MG  --   --   --  2.0   Liver Function Tests:  Recent Labs Lab 05/29/13  0527  AST 12  ALT 14  ALKPHOS 57  BILITOT 0.3  PROT 5.4*  ALBUMIN 2.2*    Recent Labs Lab 05/30/13 1724  LIPASE 11  AMYLASE 20   No results found for this basename: AMMONIA,  in the last 168 hours CBC:  Recent Labs Lab 05/29/13 0527 06/01/13 0540 06/02/13 0645 06/03/13 0630  WBC 10.5 10.6* 11.6* 11.2*  HGB 10.5* 11.6* 12.0* 10.5*  HCT 31.3* 34.7* 36.6* 31.5*  MCV 100.0 101.5* 101.9* 100.0  PLT 161 254 229 220   Cardiac Enzymes: No results found for this basename: CKTOTAL, CKMB, CKMBINDEX, TROPONINI,  in the last 168 hours BNP (last 3 results) No results found for this basename: PROBNP,  in the last 8760 hours CBG:  Recent Labs Lab 06/02/13 1223 06/02/13 1709 06/02/13 1717 06/02/13 2052 06/03/13 0821  GLUCAP 144* 463* 403* 341* 184*    Recent Results (from the past 240 hour(s))  CLOSTRIDIUM DIFFICILE BY PCR     Status: None   Collection Time    05/26/13  4:39 AM      Result Value Ref Range Status   C difficile by pcr NEGATIVE  NEGATIVE Final  CSF CULTURE     Status: None   Collection Time    05/29/13  2:20 PM      Result Value Ref Range Status   Specimen Description CSF   Final    Special Requests NONE   Final   Gram Stain     Final   Value: WBC PRESENT, PREDOMINANTLY MONONUCLEAR     NO ORGANISMS SEEN     CYTOSPIN Performed at [facility]     Performed at [facility]   Culture     Final   Value: NO GROWTH 3 DAYS     Performed at [facility]   Report Status 06/01/2013 FINAL   Final  GRAM STAIN     Status: None   Collection Time    05/29/13  2:20 PM      Result Value Ref Range Status   Specimen Description CSF   Final   Special Requests NONE   Final   Gram Stain     Final   Value: WBC PRESENT, PREDOMINANTLY MONONUCLEAR     NO ORGANISMS SEEN     CYTOSPIN SLIDE   Report Status 05/29/2013 FINAL   Final  URINE CULTURE     Status: None   Collection Time    05/30/13  6:42 PM      Result Value Ref Range Status   Specimen Description URINE, CLEAN CATCH   Final   Special Requests NONE   Final   Culture  Setup Time     Final   Value: 05/30/2013 22:05     Performed at [facility] Count     Final   Value: 40,000 COLONIES/ML     Performed at [facility]   Culture     Final   Value: Multiple bacterial morphotypes present, none predominant. Suggest appropriate recollection if clinically indicated.     Performed at [facility]   Report Status 05/31/2013 FINAL   Final  CULTURE, BLOOD (ROUTINE X 2)     Status: None   Collection Time    05/31/13  8:18 AM      Result Value Ref Range Status   Specimen Description BLOOD LEFT ARM   Final   Special Requests BOTTLES DRAWN AEROBIC AND ANAEROBIC 5CC  Final   Culture  Setup Time     Final   Value: 05/31/2013 12:28     Performed at [facility]   Culture     Final   Value:        BLOOD CULTURE RECEIVED NO GROWTH TO DATE CULTURE WILL BE HELD FOR 5 DAYS BEFORE ISSUING A FINAL NEGATIVE REPORT     Performed at [facility]   Report Status PENDING   Incomplete  CLOSTRIDIUM DIFFICILE BY PCR     Status: None   Collection Time    05/31/13  7:50 PM       Result Value Ref Range Status   C difficile by pcr NEGATIVE  NEGATIVE Final     Studies: Dg Hand Complete Left  06/01/2013   CLINICAL DATA:  Left hand swelling.  EXAM: LEFT HAND - COMPLETE 3+ VIEW  COMPARISON:  None.  FINDINGS: The bones are osteopenic. No acute fracture or subluxation identified. No radio-opaque foreign body or soft tissue calcification. Calcified atherosclerotic change noted.  IMPRESSION: 1. No acute findings. 2. Osteopenia.   Electronically Signed   By: Kerby Moors M.D.   On: 06/01/2013 17:32    Scheduled Meds:  Anti-infectives   Start     Dose/Rate Route Frequency Ordered Stop   06/03/13 0600  vancomycin (VANCOCIN) 1,250 mg in sodium chloride 0.9 % 250 mL IVPB  Status:  Discontinued     1,250 mg 166.7 mL/hr over 90 Minutes Intravenous Every 24 hours 06/02/13 1432 06/02/13 1436   06/02/13 0015  vancomycin (VANCOCIN) 1,250 mg in sodium chloride 0.9 % 250 mL IVPB  Status:  Discontinued     1,250 mg 166.7 mL/hr over 90 Minutes Intravenous Every 24 hours 06/02/13 1436 06/02/13 1540   05/31/13 1200  ampicillin (OMNIPEN) 2 g in sodium chloride 0.9 % 50 mL IVPB  Status:  Discontinued     2 g 150 mL/hr over 20 Minutes Intravenous 6 times per day 05/31/13 0826 06/02/13 1540   05/31/13 0600  vancomycin (VANCOCIN) IVPB 1000 mg/200 mL premix  Status:  Discontinued     1,000 mg 200 mL/hr over 60 Minutes Intravenous Every 12 hours 05/30/13 1639 06/02/13 1432   05/30/13 2200  cefTRIAXone (ROCEPHIN) 2 g in dextrose 5 % 50 mL IVPB  Status:  Discontinued     2 g 100 mL/hr over 30 Minutes Intravenous Every 12 hours 05/30/13 1621 06/02/13 1540   05/30/13 1645  vancomycin (VANCOCIN) 2,000 mg in sodium chloride 0.9 % 500 mL IVPB     2,000 mg 250 mL/hr over 120 Minutes Intravenous STAT 05/30/13 1639 05/30/13 2003   05/30/13 1630  ampicillin (OMNIPEN) 1 g in sodium chloride 0.9 % 50 mL IVPB  Status:  Discontinued     1 g 150 mL/hr over 20 Minutes Intravenous 6 times per day 05/30/13  1621 05/31/13 0826   05/30/13 0600  acyclovir (ZOVIRAX) 700 mg in dextrose 5 % 100 mL IVPB  Status:  Discontinued     700 mg 114 mL/hr over 60 Minutes Intravenous Every 8 hours 05/30/13 0217 06/01/13 1337   05/29/13 1800  acyclovir (ZOVIRAX) 965 mg in dextrose 5 % 150 mL IVPB  Status:  Discontinued     10 mg/kg  96.6 kg 169.3 mL/hr over 60 Minutes Intravenous Every 8 hours 05/29/13 1702 05/30/13 0217   05/19/13 2200  valACYclovir (VALTREX) tablet 500 mg  Status:  Discontinued     500 mg Oral 2 times daily 05/19/13 1726  05/20/13 1103     . allopurinol  300 mg Oral q morning - 10a  . amitriptyline  12.5 mg Oral QHS  . aspirin  81 mg Oral Daily  . atorvastatin  20 mg Oral q1800  . calcium-vitamin D  1 tablet Oral QPM  . colchicine  0.6 mg Oral BID  . diltiazem  180 mg Oral q morning - 10a  . feeding supplement (GLUCERNA SHAKE)  237 mL Oral BID PC  . insulin aspart  0-15 Units Subcutaneous TID WC  . insulin aspart  0-5 Units Subcutaneous QHS  . insulin aspart  4 Units Subcutaneous TID WC  . insulin glargine  15 Units Subcutaneous BID  . lidocaine  1 patch Transdermal Q24H  . methylPREDNISolone (SOLU-MEDROL) injection  60 mg Intravenous Daily  . metoprolol  25 mg Oral BID  . multivitamin with minerals  1 tablet Oral QPM  . polyethylene glycol  17 g Oral BID  . potassium chloride  40 mEq Oral Q4H  . rivaroxaban  20 mg Oral Q supper    Time spent: 35 min   06/03/2013, 9:28 AM  LOS: 15 days   Thurnell Lose M.D on 06/03/2013 at 9:28 AM  Between 7am to 7pm - Pager - [phone], After 7pm go to [email or web address] - password [email or web address]  And look for the night coverage person covering me after hours  [facility]  Office  [phone]

## 2013-06-04 LAB — BASIC METABOLIC PANEL
BUN: 23 mg/dL (ref 6–23)
CO2: 18 meq/L — AB (ref 19–32)
CREATININE: 0.78 mg/dL (ref 0.50–1.35)
Calcium: 10 mg/dL (ref 8.4–10.5)
Chloride: 98 mEq/L (ref 96–112)
GFR calc non Af Amer: 84 mL/min — ABNORMAL LOW (ref >90)
Glucose, Bld: 194 mg/dL — ABNORMAL HIGH (ref 70–99)
Potassium: 4.8 mEq/L (ref 3.7–5.3)
Sodium: 134 mEq/L — ABNORMAL LOW (ref 137–147)

## 2013-06-04 LAB — GLUCOSE, CAPILLARY
GLUCOSE-CAPILLARY: 167 mg/dL — AB (ref 70–99)
GLUCOSE-CAPILLARY: 224 mg/dL — AB (ref 70–99)
Glucose-Capillary: 157 mg/dL — ABNORMAL HIGH (ref 70–99)
Glucose-Capillary: 205 mg/dL — ABNORMAL HIGH (ref 70–99)

## 2013-06-04 LAB — CBC
HCT: 32.3 % — ABNORMAL LOW (ref 39.0–52.0)
Hemoglobin: 10.9 g/dL — ABNORMAL LOW (ref 13.0–17.0)
MCH: 34.2 pg — ABNORMAL HIGH (ref 26.0–34.0)
MCHC: 33.7 g/dL (ref 30.0–36.0)
MCV: 101.3 fL — AB (ref 78.0–100.0)
Platelets: 254 10*3/uL (ref 150–400)
RBC: 3.19 MIL/uL — ABNORMAL LOW (ref 4.22–5.81)
RDW: 16.4 % — AB (ref 11.5–15.5)
WBC: 13 10*3/uL — ABNORMAL HIGH (ref 4.0–10.5)

## 2013-06-04 MED ORDER — PREDNISONE 50 MG PO TABS
50.0000 mg | ORAL_TABLET | Freq: Every day | ORAL | Status: DC
  Administered 2013-06-05 – 2013-06-06 (×2): 50 mg via ORAL
  Filled 2013-06-04 (×4): qty 1

## 2013-06-04 NOTE — Progress Notes (Signed)
Stroke Team Progress Note  HISTORY [name] is an 80 y.o. male who was diagnosed with Herpes Zoster 4 weeks ago. He is currently taking Valtrex and prednisone. Patient was admitted for BRBPR and over the hospital stay he has had a mental decline. Getting a good history from family was difficult. What I could glean is that patient does not live alone, cannot care for himself at baseline, cannot cook and does not walk. Per brother, he is able to hold a fairly normal conversation. Currently patient is confused and unable to answer my questions. He is agitated when aroused and tends to hold his eyes shut. While in hospital he has not had a temperature but today had a temperature 102. WBC 8.2    SUBJECTIVE   The patient reports that he is feeling well. He has no complaints.  OBJECTIVE Most recent Vital Signs: Filed Vitals:   06/03/13 0526 06/03/13 1800 06/03/13 2331 06/04/13 0615  BP: 102/68 123/64 124/70 125/74  Pulse: 86 68 88 104  Temp: 98.4 F (36.9 C) 97.6 F (36.4 C) 97.5 F (36.4 C) 97.3 F (36.3 C)  TempSrc: Oral Oral Oral Oral  Resp: 17 20 18 20   Height:      Weight: 222 lb 6.4 oz (100.88 kg)   218 lb 8 oz (99.111 kg)  SpO2: 97% 93% 94% 96%   CBG (last 3)   Recent Labs  06/03/13 1755 06/03/13 2322 06/04/13 0752  GLUCAP 278* 250* 167*    IV Fluid Intake:     MEDICATIONS  . allopurinol  300 mg Oral q morning - 10a  . amitriptyline  12.5 mg Oral QHS  . aspirin  81 mg Oral Daily  . atorvastatin  20 mg Oral q1800  . calcium-vitamin D  1 tablet Oral QPM  . colchicine  0.6 mg Oral BID  . diltiazem  180 mg Oral q morning - 10a  . feeding supplement (GLUCERNA SHAKE)  237 mL Oral BID PC  . insulin aspart  0-15 Units Subcutaneous TID WC  . insulin aspart  0-5 Units Subcutaneous QHS  . insulin aspart  4 Units Subcutaneous TID WC  . insulin glargine  15 Units Subcutaneous BID  . lidocaine  1 patch Transdermal Q24H  . methylPREDNISolone (SOLU-MEDROL) injection  60 mg  Intravenous Daily  . metoprolol  25 mg Oral BID  . multivitamin with minerals  1 tablet Oral QPM  . polyethylene glycol  17 g Oral BID  . rivaroxaban  20 mg Oral Q supper   PRN:  acetaminophen, HYDROcodone-acetaminophen, nitroGLYCERIN  Diet:  Dysphagia 1 with thin liquids Activity:  Up with assistance DVT Prophylaxis:  SCDs  CLINICALLY SIGNIFICANT STUDIES Basic Metabolic Panel:   Recent Labs Lab 06/03/13 0630 06/04/13 0325  NA 134* 134*  K 3.6* 4.8  CL 96 98  CO2 28 18*  GLUCOSE 191* 194*  BUN 19 23  CREATININE 0.82 0.78  CALCIUM 9.4 10.0  MG 2.0  --    Liver Function Tests:   Recent Labs Lab 05/29/13 0527  AST 12  ALT 14  ALKPHOS 57  BILITOT 0.3  PROT 5.4*  ALBUMIN 2.2*   CBC:   Recent Labs Lab 06/03/13 0630 06/04/13 0325  WBC 11.2* 13.0*  HGB 10.5* 10.9*  HCT 31.5* 32.3*  MCV 100.0 101.3*  PLT 220 254   Coagulation: No results found for this basename: LABPROT, INR,  in the last 168 hours Cardiac Enzymes: No results found for this basename: CKTOTAL, CKMB,  CKMBINDEX, TROPONINI,  in the last 168 hours Urinalysis:   Recent Labs Lab 05/30/13 [place] 1.011  PHURINE 7.5  GLUCOSEU NEGATIVE  HGBUR NEGATIVE  BILIRUBINUR NEGATIVE  KETONESUR NEGATIVE  PROTEINUR NEGATIVE  UROBILINOGEN 0.2  NITRITE NEGATIVE  LEUKOCYTESUR NEGATIVE   Lipid Panel    Component Value Date/Time   CHOL 98 06/01/2013 0940   TRIG 82 06/01/2013 0940   HDL 29* 06/01/2013 0940   CHOLHDL 3.4 06/01/2013 0940   VLDL 16 06/01/2013 0940   LDLCALC 53 06/01/2013 0940   HgbA1C  Lab Results  Component Value Date   HGBA1C 6.4* 06/01/2013    Urine Drug Screen:     Component Value Date/Time   LABOPIA NONE DETECTED 11/27/2012 1943   COCAINSCRNUR NONE DETECTED 11/27/2012 1943   LABBENZ NONE DETECTED 11/27/2012 1943   AMPHETMU NONE DETECTED 11/27/2012 1943   THCU NONE DETECTED 11/27/2012 1943   LABBARB NONE DETECTED 11/27/2012 1943    Alcohol Level: No results found for  this basename: ETH,  in the last 168 hours  Dg Abd 1 View 05/31/2013    No evidence of bowel obstruction.     Mr Brain Wo Contrast 05/31/2013    1.5 cm acute infarction affecting a left frontal gyrus at the vertex. No swelling or gross hemorrhage. This is new since the study of 04/28.  Chronic small-vessel changes elsewhere.       Dg Shoulder Left Port 06/01/2013    No acute soft tissue bony or abnormality identified. No evidence of fracture or dislocation. No evidence of arthropathy. No evidence to suggest gout.   Dg Hand Complete Left 06/01/2013    1. No acute findings. 2. Osteopenia.     MRA of the brain    2D Echocardiogram  see TEE  TEE - No cardiac source of embolism or signs of endocarditis.  Carotid Doppler  Findings consistent with 1-39 percent stenosis involving the right internal carotid artery and the left internal carotid artery.   CXR    EKG atrial fibrillation rate 105 beats per minute.  For complete results please see formal report.   Therapy Recommendations SNF  Physical Exam   Mental Status:  Alert, not oriented to time, place, date, thought content not appropriate. Holds his eyes shut. Speech fluent without evidence of aphasia. Not able to follow 3 step commands.  Cranial Nerves:  II: blinks to threat bilaterally, pupils equal, round, reactive to light and accommodation  III,IV, VI: will not open eyes but shows good strength forcing his eyes shut, eyes disconjugate when opened, right eye did not deviate fully laterally and left eye did not deviate fully medially.  V,VII: smile symmetric, facial light touch sensation normal bilaterally  VIII: hearing normal bilaterally  IX,X: gag reflex present  XI: bilateral shoulder shrug  XII: midline tongue extension without atrophy or fasciculations  Motor: MOTOR IMPERSISTENCE. RIGHT ARM DRIFT WITH EYES CLOSED. Right :  Upper extremity 4-/5   Left:  Upper extremity 4-/5   Lower extremity 5/5    Lower extremity 5/5   Tone and bulk:normal tone throughout; no atrophy noted  Sensory: Pinprick and light touch intact throughout, bilaterally  Deep Tendon Reflexes:  Right: Upper Extremity Left: Upper extremity  biceps (C-5 to C-6) 2/4 biceps (C-5 to C-6) 2/4  tricep (C7) 2/4 triceps (C7) 2/4  Brachioradialis (C6) 2/4 Brachioradialis (C6) 2/4  Lower Extremity Lower Extremity  quadriceps (L-2 to L-4) 1/4 quadriceps (L-2 to L-4) 1/4  Achilles (S1) 0/4  Achilles (S1) 0/4  Plantars:  [place] bilaterally  Cerebellar:  Would not take part in testing  Gait: not able to assess due to mental status   ASSESSMENT [name] is a 80 y.o. male presenting with altered now status. TPA was not administered as it was not clear that the patient had a stroke at the time of onset was unknown. MRI -  1.5 cm acute infarction affecting a left frontal gyrus at the vertex. The infarct was felt to be thrombotic secondary to small vessel disease. On no antithrombotics prior to admission. Now on aspirin 81 mg orally every day for secondary stroke prevention. Patient with resultant altered mental status. Stroke work up underway.   Hyperlipidemia - Zocor prior to admission - cholesterol 98 LDL 53  Hypertension history  Left lower extremity DVT  Coronary artery disease  Previous stroke in 2012  Paroxysmal atrial fibrillation.  Coronary artery disease  Diabetes mellitus - hemoglobin A1c 6.4  Hypokalemia   Hospital day # 16  TREATMENT/PLAN  Continue aspirin 81 mg orally every day for secondary stroke prevention. I would recommend only doing this for 2 weeks as the patient already is on Xarelto anticoagulation due to atrial fibrillation.  Consider repeat MRI - See note below   Mikey Bussing PA-C [facility] Pager [phone] 06/04/2013, 8:07 AM  I evaluated and examined patient, reviewed records, labs and imaging, and agree with note and plan. Left frontal brain abnormality on MRI likely  represents acute ischemic infarct, and also not likely related to patient's underlying encephalopathy. He may have slight right arm drift that could localize to the new stroke.   In setting of fever, confusion, and CSF pleocytosis, if this MRI/DWI abnormality could represent focal abscess or other infection, then it may be symptomatic. This MRI brain study was limited by patient motion and intolerance of exam, and therefore only limited study was performed. Consider repeat MRI brain (with and without) when patient is stable.      Please refer to [email or web address] for on-call Stroke MD   To contact Stroke Continuity provider, please refer to [email or web address] After hours, contact General Neurology

## 2013-06-04 NOTE — Progress Notes (Signed)
PROGRESS NOTE  [name]:[id number] DOB: 09/02/1931 DOA: 05/19/2013 PCP: Leonides Grills, MD   [name] is a 80 y.o. male  With a history of hypertension, hyperlipidemia, left leg DVT, coronary artery disease, history of rectal bleeding, prostate cancer status post radiation, presents emergency department for bright red blood per rectum. Patient states this has been ongoing for approximately 4 days. He had a similar episode in February of 2015 at which point he underwent a sigmoidoscopy which showed bleeding from a dilated capillary, which was due to self disimpaction. Patient was recently admitted on 05/07/2013 weakness and pain control shingles. He is currently taking Valtrex and prednisone. Patient is currently in pain. He only complains of shaking and weakness.  GI was consulted initially. Initially felt for no intervention if hgb remained stable. Pt later became febrile with tmax of 102 with ms changes. Pan cultures were unremarkable. Neurology was consulted who recommended LP. LP was notable for a wbc of 18. Empiric acyclovir was started. ID was consulted with recs for added vanc, rocephin, and amp.   Assessment/Plan:   Fever with decreased mental status (neurology and ID following)  -U/A clear, chest x-ray clear, blood cultures negative so far. CSF cultures also negative to date. -Borderline CSF ID consulted and patient currently was on empiric Vanco-ampicillin-Rocephin along with acyclovir stopped by id 06-02-13.  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 He has shown remarkable improvement after a trial of IV Solu-Medrol which was started on 06/02/2013, we'll switch him to oral prednisone on 06/05/2013 and continue colchicine.   Acute CVA  On repeat MRI 1.5 cm acute infarction affecting a left frontal gyrus at the vertex. Placed on ASA or one month then to be stopped as he is on xaralto per neurology, continue on tele, obtain PT, OT, speech input, stable A1c  and lipid panel, stable carotid duplex, neurology following. stable TEE.   Lab Results  Component Value Date   HGBA1C 6.4* 06/01/2013    Lab Results  Component Value Date   CHOL 98 06/01/2013   HDL 29* 06/01/2013   LDLCALC 53 06/01/2013   TRIG 82 06/01/2013   CHOLHDL 3.4 06/01/2013    History of gout  Despite normal uric acid levels there is a possibility that this could be disseminated gout, he does have some pain in his left ankle and wrist, left wrist x-ray stable, left ankle has some redness which is acute on chronic per patient and family, now improving on steroids and colchicine started on 06/02/2013     Hypokalemia  Replaced oral x 2 and now stable   Baseline at home appears to be sitting in chair and pivoting to toilet Continue PTOT. May require placement   Painless hematochezia.   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 S/p sigmoidoscopy show no other source of bleeding about 6 weeks ago.  -started Miralax 2 to 3 times a day - changed to PRN as pt started having loose stools -Hgb 10.5 currently (was 13 on 4/24). Hemodynamically stable. Cont to follow closely -Resumed on home dose Xaralto closely monitor hemoglobin   Shingles  -Much improved on lidocaine patch and amitriptyline   Hypertension  -Continue metoprolol and Lasix    Diabetes mellitus with neuropathy  -Hold glipizide placed on Lantus, continue ISS -Neurontin held due to decreased mental status -Last hemoglobin A1c November 2014 6.6  CBG (last 3)   Recent Labs  06/03/13 1755 06/03/13 2322 06/04/13 0752  GLUCAP 278* 250* 167*  Chronic Anticoagulation - pt with hx of CVA in setting of afib - Pt with documented hx of LLE DVT s/p hip surgery - Pt is now s/p LP - discussed with fluro - Resumed xarelto - observe closely for bleed     Code Status: DNR Family Communication: family at bedside updated personally on 05/31/2013 , 06/01/2013 Disposition Plan: PT eval- recs SNF but family has 24 hour  care givers and want to take home     Consultants:  GI  Neurology  ID  Procedures:  MRI brain 4/28  Repeat MRI 05/31/2013    LP   TEE  Carotid US   TEE  - Left ventricle: The cavity size was normal. Wall thickness was normal. Systolic function was normal. The estimated ejection fraction was in the range of 55% to 60%. - Aortic valve: No evidence of vegetation. No evidence of vegetation. - Mitral valve: No evidence of vegetation. - Left atrium: The atrium was dilated. No evidence of hrombus in the atrial cavity or appendage. No spontaneous cho contrast was observed. - Right atrium: No evidence of thrombus in the atrial cavity or  appendage. - Atrial septum: No defect or patent foramen ovale was identified. Echo contrast study showed no right-to-left atrial level shunt, following an increase in RA pressure induced by provocative maneuvers. - Tricuspid valve: No evidence of vegetation. - Pulmonic valve: No evidence of vegetation.      HPI/Subjective:  Today,  lying in bed in no discomfort. Feels much better, surrounded by family members, no headache, no chest abdominal pain. No joint pains today    Objective:  Intake/Output Summary (Last 24 hours) at 06/04/13 0842 Last data filed at 06/03/13 2300  Gross per 24 hour  Intake      0 ml  Output    925 ml  Net   -925 ml   Filed Weights   06/02/13 0648 06/03/13 0526 06/04/13 0615  Weight: 102.876 kg (226 lb 12.8 oz) 100.88 kg (222 lb 6.4 oz) 99.111 kg (218 lb 8 oz)    Filed Vitals:   06/03/13 0526 06/03/13 1800 06/03/13 2331 06/04/13 0615  BP: 102/68 123/64 124/70 125/74  Pulse: 86 68 88 104  Temp: 98.4 F (36.9 C) 97.6 F (36.4 C) 97.5 F (36.4 C) 97.3 F (36.3 C)  TempSrc: Oral Oral Oral Oral  Resp: 17 20 18 20   Height:      Weight: 100.88 kg (222 lb 6.4 oz)   99.111 kg (218 lb 8 oz)  SpO2: 97% 93% 94% 96%   Exam:  General: Alert and less drowsy in the bed, he looks and feels much better and is  in no distress, family bedside Cardiovascular: S1 S2 auscultated, no rubs, murmurs or gallops. Regular rate and rhythm.  Respiratory: Clear to auscultation bilaterally with equal chest rise  Abdomen: Soft, obese, nontender, nondistended, + bowel sounds  Neuro: moves all 4 ext- eyes closed Skin: scarring on right thigh  Data Reviewed:   Basic Metabolic Panel:  Recent Labs Lab 05/29/13 0527 06/01/13 0540 06/02/13 0645 06/03/13 0630 06/04/13 0325  NA 136* 138 137 134* 134*  K 3.8 3.9 3.0* 3.6* 4.8  CL 97 98 94* 96 98  CO2 27 25 26 28  18*  GLUCOSE 156* 100* 92 191* 194*  BUN 21 12 13 19 23   CREATININE 0.99 0.84 0.96 0.82 0.78  CALCIUM 9.3 9.5 9.5 9.4 10.0  MG  --   --   --  2.0  --  Liver Function Tests:  Recent Labs Lab 05/29/13 0527  AST 12  ALT 14  ALKPHOS 57  BILITOT 0.3  PROT 5.4*  ALBUMIN 2.2*    Recent Labs Lab 05/30/13 1724  LIPASE 11  AMYLASE 20   No results found for this basename: AMMONIA,  in the last 168 hours CBC:  Recent Labs Lab 05/29/13 0527 06/01/13 0540 06/02/13 0645 06/03/13 0630 06/04/13 0325  WBC 10.5 10.6* 11.6* 11.2* 13.0*  HGB 10.5* 11.6* 12.0* 10.5* 10.9*  HCT 31.3* 34.7* 36.6* 31.5* 32.3*  MCV 100.0 101.5* 101.9* 100.0 101.3*  PLT 161 254 229 220 254   Cardiac Enzymes: No results found for this basename: CKTOTAL, CKMB, CKMBINDEX, TROPONINI,  in the last 168 hours BNP (last 3 results) No results found for this basename: PROBNP,  in the last 8760 hours CBG:  Recent Labs Lab 06/03/13 0821 06/03/13 1203 06/03/13 1755 06/03/13 2322 06/04/13 0752  GLUCAP 184* 236* 278* 250* 167*    Recent Results (from the past 240 hour(s))  CLOSTRIDIUM DIFFICILE BY PCR     Status: None   Collection Time    05/26/13  4:39 AM      Result Value Ref Range Status   C difficile by pcr NEGATIVE  NEGATIVE Final  CSF CULTURE     Status: None   Collection Time    05/29/13  2:20 PM      Result Value Ref Range Status   Specimen  Description CSF   Final   Special Requests NONE   Final   Gram Stain     Final   Value: WBC PRESENT, PREDOMINANTLY MONONUCLEAR     NO ORGANISMS SEEN     CYTOSPIN Performed at [facility]     Performed at [facility]   Culture     Final   Value: NO GROWTH 3 DAYS     Performed at [facility]   Report Status 06/01/2013 FINAL   Final  GRAM STAIN     Status: None   Collection Time    05/29/13  2:20 PM      Result Value Ref Range Status   Specimen Description CSF   Final   Special Requests NONE   Final   Gram Stain     Final   Value: WBC PRESENT, PREDOMINANTLY MONONUCLEAR     NO ORGANISMS SEEN     CYTOSPIN SLIDE   Report Status 05/29/2013 FINAL   Final  URINE CULTURE     Status: None   Collection Time    05/30/13  6:42 PM      Result Value Ref Range Status   Specimen Description URINE, CLEAN CATCH   Final   Special Requests NONE   Final   Culture  Setup Time     Final   Value: 05/30/2013 22:05     Performed at [place]     Final   Value: 40,000 COLONIES/ML     Performed at [facility]   Culture     Final   Value: Multiple bacterial morphotypes present, none predominant. Suggest appropriate recollection if clinically indicated.     Performed at [facility]   Report Status 05/31/2013 FINAL   Final  CULTURE, BLOOD (ROUTINE X 2)     Status: None   Collection Time    05/31/13  8:18 AM      Result Value Ref Range Status   Specimen Description BLOOD LEFT ARM  Final   Special Requests BOTTLES DRAWN AEROBIC AND ANAEROBIC 5CC   Final   Culture  Setup Time     Final   Value: 05/31/2013 12:28     Performed at [facility]   Culture     Final   Value:        BLOOD CULTURE RECEIVED NO GROWTH TO DATE CULTURE WILL BE HELD FOR 5 DAYS BEFORE ISSUING A FINAL NEGATIVE REPORT     Performed at [facility]   Report Status PENDING   Incomplete  CLOSTRIDIUM DIFFICILE BY PCR     Status: None   Collection Time     05/31/13  7:50 PM      Result Value Ref Range Status   C difficile by pcr NEGATIVE  NEGATIVE Final     Studies: No results found.  Scheduled Meds:  Anti-infectives   Start     Dose/Rate Route Frequency Ordered Stop   06/03/13 0600  vancomycin (VANCOCIN) 1,250 mg in sodium chloride 0.9 % 250 mL IVPB  Status:  Discontinued     1,250 mg 166.7 mL/hr over 90 Minutes Intravenous Every 24 hours 06/02/13 1432 06/02/13 1436   06/02/13 0015  vancomycin (VANCOCIN) 1,250 mg in sodium chloride 0.9 % 250 mL IVPB  Status:  Discontinued     1,250 mg 166.7 mL/hr over 90 Minutes Intravenous Every 24 hours 06/02/13 1436 06/02/13 1540   05/31/13 1200  ampicillin (OMNIPEN) 2 g in sodium chloride 0.9 % 50 mL IVPB  Status:  Discontinued     2 g 150 mL/hr over 20 Minutes Intravenous 6 times per day 05/31/13 0826 06/02/13 1540   05/31/13 0600  vancomycin (VANCOCIN) IVPB 1000 mg/200 mL premix  Status:  Discontinued     1,000 mg 200 mL/hr over 60 Minutes Intravenous Every 12 hours 05/30/13 1639 06/02/13 1432   05/30/13 2200  cefTRIAXone (ROCEPHIN) 2 g in dextrose 5 % 50 mL IVPB  Status:  Discontinued     2 g 100 mL/hr over 30 Minutes Intravenous Every 12 hours 05/30/13 1621 06/02/13 1540   05/30/13 1645  vancomycin (VANCOCIN) 2,000 mg in sodium chloride 0.9 % 500 mL IVPB     2,000 mg 250 mL/hr over 120 Minutes Intravenous STAT 05/30/13 1639 05/30/13 2003   05/30/13 1630  ampicillin (OMNIPEN) 1 g in sodium chloride 0.9 % 50 mL IVPB  Status:  Discontinued     1 g 150 mL/hr over 20 Minutes Intravenous 6 times per day 05/30/13 1621 05/31/13 0826   05/30/13 0600  acyclovir (ZOVIRAX) 700 mg in dextrose 5 % 100 mL IVPB  Status:  Discontinued     700 mg 114 mL/hr over 60 Minutes Intravenous Every 8 hours 05/30/13 0217 06/01/13 1337   05/29/13 1800  acyclovir (ZOVIRAX) 965 mg in dextrose 5 % 150 mL IVPB  Status:  Discontinued     10 mg/kg  96.6 kg 169.3 mL/hr over 60 Minutes Intravenous Every 8 hours 05/29/13  1702 05/30/13 0217   05/19/13 2200  valACYclovir (VALTREX) tablet 500 mg  Status:  Discontinued     500 mg Oral 2 times daily 05/19/13 1726 05/20/13 1103     . allopurinol  300 mg Oral q morning - 10a  . amitriptyline  12.5 mg Oral QHS  . aspirin  81 mg Oral Daily  . atorvastatin  20 mg Oral q1800  . calcium-vitamin D  1 tablet Oral QPM  . colchicine  0.6 mg Oral BID  . diltiazem  180 mg Oral q morning - 10a  . feeding supplement (GLUCERNA SHAKE)  237 mL Oral BID PC  . insulin aspart  0-15 Units Subcutaneous TID WC  . insulin aspart  0-5 Units Subcutaneous QHS  . insulin aspart  4 Units Subcutaneous TID WC  . insulin glargine  15 Units Subcutaneous BID  . lidocaine  1 patch Transdermal Q24H  . methylPREDNISolone (SOLU-MEDROL) injection  60 mg Intravenous Daily  . metoprolol  25 mg Oral BID  . multivitamin with minerals  1 tablet Oral QPM  . polyethylene glycol  17 g Oral BID  . [START ON 06/05/2013] predniSONE  50 mg Oral Q breakfast  . rivaroxaban  20 mg Oral Q supper    Time spent: 35 min   06/04/2013, 8:42 AM  LOS: 16 days   Thurnell Lose M.D on 06/04/2013 at 8:42 AM  Between 7am to 7pm - Pager - [phone], After 7pm go to [email or web address] - password [email or web address]  And look for the night coverage person covering me after hours  [facility]  Office  [phone]

## 2013-06-05 ENCOUNTER — Encounter (HOSPITAL_COMMUNITY): Payer: Self-pay | Admitting: Cardiovascular Disease

## 2013-06-05 ENCOUNTER — Inpatient Hospital Stay (HOSPITAL_COMMUNITY): Payer: Medicare Other

## 2013-06-05 LAB — BASIC METABOLIC PANEL
BUN: 33 mg/dL — ABNORMAL HIGH (ref 6–23)
CO2: 24 mEq/L (ref 19–32)
Calcium: 9.7 mg/dL (ref 8.4–10.5)
Chloride: 102 mEq/L (ref 96–112)
Creatinine, Ser: 0.9 mg/dL (ref 0.50–1.35)
GFR calc Af Amer: >90 mL/min (ref >90)
GFR calc non Af Amer: 79 mL/min — ABNORMAL LOW (ref >90)
Glucose, Bld: 220 mg/dL — ABNORMAL HIGH (ref 70–99)
POTASSIUM: 4.5 meq/L (ref 3.7–5.3)
Sodium: 139 mEq/L (ref 137–147)

## 2013-06-05 LAB — GLUCOSE, CAPILLARY
GLUCOSE-CAPILLARY: 174 mg/dL — AB (ref 70–99)
Glucose-Capillary: 196 mg/dL — ABNORMAL HIGH (ref 70–99)
Glucose-Capillary: 229 mg/dL — ABNORMAL HIGH (ref 70–99)
Glucose-Capillary: 191 mg/dL — ABNORMAL HIGH (ref 70–99)

## 2013-06-05 LAB — CBC
HCT: 30.5 % — ABNORMAL LOW (ref 39.0–52.0)
Hemoglobin: 10.1 g/dL — ABNORMAL LOW (ref 13.0–17.0)
MCH: 33.7 pg (ref 26.0–34.0)
MCHC: 33.1 g/dL (ref 30.0–36.0)
MCV: 101.7 fL — ABNORMAL HIGH (ref 78.0–100.0)
Platelets: 255 10*3/uL (ref 150–400)
RBC: 3 MIL/uL — ABNORMAL LOW (ref 4.22–5.81)
RDW: 16.8 % — AB (ref 11.5–15.5)
WBC: 10.2 10*3/uL (ref 4.0–10.5)

## 2013-06-05 LAB — BLOOD GAS, ARTERIAL
Acid-Base Excess: 3.4 mmol/L — ABNORMAL HIGH (ref 0.0–2.0)
Bicarbonate: 27.3 mEq/L — ABNORMAL HIGH (ref 20.0–24.0)
Drawn by: 103701
FIO2: 0.21 %
O2 Saturation: 91.4 %
PCO2 ART: 41 mmHg (ref 35.0–45.0)
PO2 ART: 65.3 mmHg — AB (ref 80.0–100.0)
Patient temperature: 98.6
TCO2: 28.6 mmol/L (ref 0–100)
pH, Arterial: 7.439 (ref 7.350–7.450)

## 2013-06-05 NOTE — Progress Notes (Signed)
Pt's brother at bedside, concerned about increasing lethargy. Pt unable to wake to take meds. VSS. Dr. Candiss Norse notified. Orders received.

## 2013-06-05 NOTE — Progress Notes (Signed)
Stroke education materials provided to pt's sister, as pt is unable to participate in coversation at this time.

## 2013-06-05 NOTE — Progress Notes (Signed)
Pt remains lethargic. Pt taken for stat C/T of the head. Pt ABG's completed. Comfort given to brother. Would like Dr to call with results and plan. Iona [phone]

## 2013-06-05 NOTE — Progress Notes (Signed)
Physical Therapy Treatment Patient Details Name: [name] MRN: [id number] DOB: 07-25-1931 Today's Date: 06/05/2013    History of Present Illness Pt is a 80 year old male with history of recent shingles, protate cancer, rectal bleeding. Admitted with fever and AMS of unknown source. CXR has been clear. Second MRI shows 1.5 cm acute infarction affecting a left frontal gyrus at the vertex.    PT Comments    Today's session came after transfer from chair to EOB.  Emphasis placed on sitting balance and tolerance at EOB which took 2 persons due to fatigue.  Follow Up Recommendations  SNF (pt's family may refuse)     Equipment Recommendations  None recommended by PT    Recommendations for Other Services       Precautions / Restrictions Precautions Precautions: Fall    Mobility  Bed Mobility Overal bed mobility: Needs Assistance Bed Mobility: Rolling;Sit to Sidelying Rolling: Max assist       Sit to sidelying: Max assist;+2 for physical assistance General bed mobility comments: cues for guidance.  Help for w/shift, support on elbows, truncal/LE assist  Transfers Overall transfer level: Needs assistance   Transfers:  (Maxi Lift transfer to the bed)              Ambulation/Gait                 Stairs            Wheelchair Mobility    Modified Rankin (Stroke Patients Only) Modified Rankin (Stroke Patients Only) Pre-Morbid Rankin Score: Moderate disability Modified Rankin: Severe disability     Balance Overall balance assessment: Needs assistance Sitting-balance support: Feet supported Sitting balance-Leahy Scale: Zero Sitting balance - Comments: sat EOB x 12 min working on upright posture/cervical ext, truncal activation.                            Cognition Arousal/Alertness: Lethargic Behavior During Therapy: Flat affect (tired) Overall Cognitive Status: Impaired/Different from baseline                       Exercises      General Comments General comments (skin integrity, edema, etc.): pt though lethargic and tired is more responsive during session today.  His L hand and arm swelling have receded significantly      Pertinent Vitals/Pain     Home Living                      Prior Function            PT Goals (current goals can now be found in the care plan section) Acute Rehab PT Goals Patient Stated Goal: per brother to go home PT Goal Formulation: With patient/family Time For Goal Achievement: 06/12/13 Potential to Achieve Goals: Fair Progress towards PT goals: Not progressing toward goals - comment (slow progress at best)    Frequency  Min 2X/week    PT Plan Current plan remains appropriate    Co-evaluation             End of Session   Activity Tolerance: Patient limited by fatigue;Patient limited by lethargy       Time: 8144-8185 PT Time Calculation (min): 28 min  Charges:  $Therapeutic Activity: 23-37 mins                    G Codes:  Tessie Fass Takumi Din 06/05/2013, 4:28 PM 06/05/2013  Donnella Sham, [place] [phone]  (pager)

## 2013-06-05 NOTE — Progress Notes (Signed)
Occupational Therapy Treatment Patient Details Name: [name] MRN: [id number] DOB: 1931-10-05 Today's Date: 06/05/2013    History of present illness Pt is a 80 year old male with history of recent shingles, protate cancer, rectal bleeding. Admitted with fever and AMS of unknown source. CXR has been clear. Second MRI shows 1.5 cm acute infarction affecting a left frontal gyrus at the vertex.   OT comments  Pt a bit more participatory today.  He was able to follow some simple one step commands and participate on EOB sitting activities to improve trunk and head/neck control.  He currently requires total A for all self care activities and +2 assist for all aspects of mobility and/or use of lift.   Follow Up Recommendations  SNF    Equipment Recommendations  None recommended by OT    Recommendations for Other Services      Precautions / Restrictions Precautions Precautions: Fall       Mobility Bed Mobility Overal bed mobility: Needs Assistance Bed Mobility: Rolling;Sit to Sidelying Rolling: Max assist       Sit to sidelying: Max assist;+2 for physical assistance General bed mobility comments: cues for guidance.  Help for w/shift, support on elbows, truncal/LE assist  Transfers Overall transfer level: Needs assistance   Transfers:  (Maxi Lift transfer to the bed)                Balance Overall balance assessment: Needs assistance Sitting-balance support: Feet supported Sitting balance-Leahy Scale: Zero Sitting balance - Comments: sat EOB x 12 min working on upright posture/cervical ext, truncal activation.                           ADL Overall ADL's : Needs assistance/impaired Eating/Feeding: Maximal assistance;Bed level   Grooming: Maximal assistance;Wash/dry hands;Wash/dry face;Bed level   Upper Body Bathing: Total assistance;Bed level   Lower Body Bathing: Total assistance;+2 for physical assistance;Bed level   Upper Body Dressing : Total  assistance;Bed level   Lower Body Dressing: Total assistance;+2 for physical assistance;Bed level   Toilet Transfer: Total assistance   Toileting- Clothing Manipulation and Hygiene: Total assistance;+2 for physical assistance;Bed level       Functional mobility during ADLs: Total assistance;+2 for physical assistance General ADL Comments: Worked EOB (co treat with PT) with focus on increasing head/neck and trunk control and strenght      Vision                     Perception     Praxis      Cognition   Behavior During Therapy: Flat affect Overall Cognitive Status: Impaired/Different from baseline Area of Impairment: Attention   Current Attention Level: Focused            General Comments: Pt lethargic, but would follow one step commands with prompting and encouragement     Extremity/Trunk Assessment               Exercises     Shoulder Instructions       General Comments      Pertinent Vitals/ Pain       Pt indicates significant pain in his back 7-8 (per faces scale) with facilitation of erect sitting.  Pt was repositioned with pain appearing to be decreased   Home Living  Prior Functioning/Environment              Frequency Min 2X/week     Progress Toward Goals  OT Goals(current goals can now be found in the care plan section)     Acute Rehab OT Goals Patient Stated Goal: per brother to go home ADL Goals Pt Will Perform Grooming: with min assist;sitting Pt Will Transfer to Toilet: with mod assist;with +2 assist;bedside commode;stand pivot transfer Additional ADL Goal #1: Pt will participate in 25 mins therapeutic activity to increase activity tolerance/endurance Additional ADL Goal #2: Pt will be able to sit EOB with min A while engaging in simple self care task x 15 mins   Plan Discharge plan remains appropriate    Co-evaluation    PT/OT/SLP Co-Evaluation/Treatment:  Yes     OT goals addressed during session: Strengthening/ROM      End of Session     Activity Tolerance Patient limited by lethargy   Patient Left in bed;with call bell/phone within reach;with bed alarm set;with family/visitor present   Nurse Communication Mobility status;Need for lift equipment        Time: 8676-7209 OT Time Calculation (min): 14 min  Charges: OT General Charges $OT Visit: 1 Procedure OT Treatments $Therapeutic Activity: 8-22 mins  Angel Weedon M Geran Haithcock 06/05/2013, 4:50 PM

## 2013-06-05 NOTE — Progress Notes (Signed)
Stroke Team Progress Note  HISTORY [name] is an 80 y.o. male who was diagnosed with Herpes Zoster 4 weeks ago. He is currently taking Valtrex and prednisone. Patient was admitted for BRBPR and over the hospital stay he has had a mental decline. Getting a good history from family was difficult. What I could glean is that patient does not live alone, cannot care for himself at baseline, cannot cook and does not walk. Per brother, he is able to hold a fairly normal conversation. Currently patient is confused and unable to answer my questions. He is agitated when aroused and tends to hold his eyes shut. While in hospital he has not had a temperature but today had a temperature 102. WBC 8.2    SUBJECTIVE No new complaints. Several family members present..  OBJECTIVE Most recent Vital Signs: Filed Vitals:   06/04/13 0615 06/04/13 1450 06/04/13 2258 06/05/13 0510  BP: 125/74 139/76 125/73 125/69  Pulse: 104 95 75 82  Temp: 97.3 F (36.3 C) 97.8 F (36.6 C) 97.4 F (36.3 C) 97.4 F (36.3 C)  TempSrc: Oral Oral Oral Oral  Resp: 20 20 20 20   Height:      Weight: 218 lb 8 oz (99.111 kg)   220 lb 9.6 oz (100.064 kg)  SpO2: 96% 94% 95% 94%   CBG (last 3)   Recent Labs  06/04/13 1702 06/04/13 2257 06/05/13 0757  GLUCAP 205* 224* 196*    IV Fluid Intake:     MEDICATIONS  . allopurinol  300 mg Oral q morning - 10a  . amitriptyline  12.5 mg Oral QHS  . aspirin  81 mg Oral Daily  . atorvastatin  20 mg Oral q1800  . calcium-vitamin D  1 tablet Oral QPM  . colchicine  0.6 mg Oral BID  . diltiazem  180 mg Oral q morning - 10a  . feeding supplement (GLUCERNA SHAKE)  237 mL Oral BID PC  . insulin aspart  0-15 Units Subcutaneous TID WC  . insulin aspart  0-5 Units Subcutaneous QHS  . insulin aspart  4 Units Subcutaneous TID WC  . insulin glargine  15 Units Subcutaneous BID  . lidocaine  1 patch Transdermal Q24H  . metoprolol  25 mg Oral BID  . multivitamin with minerals  1  tablet Oral QPM  . polyethylene glycol  17 g Oral BID  . predniSONE  50 mg Oral Q breakfast  . rivaroxaban  20 mg Oral Q supper   PRN:  acetaminophen, HYDROcodone-acetaminophen, nitroGLYCERIN  Diet:  Dysphagia 1 with thin liquids Activity:  Up with assistance DVT Prophylaxis:  SCDs  CLINICALLY SIGNIFICANT STUDIES Basic Metabolic Panel:   Recent Labs Lab 06/03/13 0630 06/04/13 0325 06/05/13 0644  NA 134* 134* 139  K 3.6* 4.8 4.5  CL 96 98 102  CO2 28 18* 24  GLUCOSE 191* 194* 220*  BUN 19 23 33*  CREATININE 0.82 0.78 0.90  CALCIUM 9.4 10.0 9.7  MG 2.0  --   --    Liver Function Tests:  No results found for this basename: AST, ALT, ALKPHOS, BILITOT, PROT, ALBUMIN,  in the last 168 hours CBC:   Recent Labs Lab 06/04/13 0325 06/05/13 0644  WBC 13.0* 10.2  HGB 10.9* 10.1*  HCT 32.3* 30.5*  MCV 101.3* 101.7*  PLT 254 255   Coagulation: No results found for this basename: LABPROT, INR,  in the last 168 hours Cardiac Enzymes: No results found for this basename: CKTOTAL, CKMB, CKMBINDEX, TROPONINI,  in the last 168 hours Urinalysis:   Recent Labs Lab 05/30/13 [place] 1.011  PHURINE 7.5  GLUCOSEU NEGATIVE  HGBUR NEGATIVE  BILIRUBINUR NEGATIVE  KETONESUR NEGATIVE  PROTEINUR NEGATIVE  UROBILINOGEN 0.2  NITRITE NEGATIVE  LEUKOCYTESUR NEGATIVE   Lipid Panel    Component Value Date/Time   CHOL 98 06/01/2013 0940   TRIG 82 06/01/2013 0940   HDL 29* 06/01/2013 0940   CHOLHDL 3.4 06/01/2013 0940   VLDL 16 06/01/2013 0940   LDLCALC 53 06/01/2013 0940   HgbA1C  Lab Results  Component Value Date   HGBA1C 6.4* 06/01/2013    Urine Drug Screen:     Component Value Date/Time   LABOPIA NONE DETECTED 11/27/2012 1943   COCAINSCRNUR NONE DETECTED 11/27/2012 1943   LABBENZ NONE DETECTED 11/27/2012 1943   AMPHETMU NONE DETECTED 11/27/2012 1943   THCU NONE DETECTED 11/27/2012 1943   LABBARB NONE DETECTED 11/27/2012 1943    Alcohol Level: No results found for  this basename: ETH,  in the last 168 hours  Dg Abd 1 View 05/31/2013    No evidence of bowel obstruction.     Mr Brain Wo Contrast 05/31/2013    1.5 cm acute infarction affecting a left frontal gyrus at the vertex. No swelling or gross hemorrhage. This is new since the study of 04/28.  Chronic small-vessel changes elsewhere.       Dg Shoulder Left Port 06/01/2013    No acute soft tissue bony or abnormality identified. No evidence of fracture or dislocation. No evidence of arthropathy. No evidence to suggest gout.   Dg Hand Complete Left 06/01/2013    1. No acute findings. 2. Osteopenia.     MRA of the brain    2D Echocardiogram  see TEE  TEE - No cardiac source of embolism or signs of endocarditis.  Carotid Doppler  Findings consistent with 1-39 percent stenosis involving the right internal carotid artery and the left internal carotid artery.   CXR    EKG atrial fibrillation rate 105 beats per minute.  For complete results please see formal report.   Therapy Recommendations SNF  Physical Exam   Mental Status:  Alert, not oriented to time, place, date, thought content not appropriate. Holds his eyes shut. Speech fluent without evidence of aphasia. Not able to follow 3 step commands.  Cranial Nerves:  II: blinks to threat bilaterally, pupils equal, round, reactive to light and accommodation  III,IV, VI: will not open eyes but shows good strength forcing his eyes shut, eyes disconjugate when opened, right eye did not deviate fully laterally and left eye did not deviate fully medially.  V,VII: smile symmetric, facial light touch sensation normal bilaterally  VIII: hearing normal bilaterally  IX,X: gag reflex present  XI: bilateral shoulder shrug  XII: midline tongue extension without atrophy or fasciculations  Motor: MOTOR IMPERSISTENCE. RIGHT ARM DRIFT WITH EYES CLOSED. Right :  Upper extremity 4-/5   Left:  Upper extremity 4-/5   Lower extremity 5/5    Lower extremity 5/5   Tone and bulk:normal tone throughout; no atrophy noted  Sensory: Pinprick and light touch intact throughout, bilaterally  Deep Tendon Reflexes:  Right: Upper Extremity Left: Upper extremity  biceps (C-5 to C-6) 2/4 biceps (C-5 to C-6) 2/4  tricep (C7) 2/4 triceps (C7) 2/4  Brachioradialis (C6) 2/4 Brachioradialis (C6) 2/4  Lower Extremity Lower Extremity  quadriceps (L-2 to L-4) 1/4 quadriceps (L-2 to L-4) 1/4  Achilles (S1) 0/4 Achilles (S1) 0/4  Plantars:  Mute bilaterally  Cerebellar:  Would not take part in testing  Gait: not able to assess due to mental status   ASSESSMENT [name] is a 80 y.o. male presenting with altered now status. TPA was not administered as it was not clear that the patient had a stroke at the time of onset was unknown. MRI -  1.5 cm acute infarction affecting a left frontal gyrus at the vertex. The infarct was felt to be thrombotic secondary to small vessel disease. On no antithrombotics prior to admission. Now on Xarelto and asa 81 mg for secondary stroke prevention. Patient with resultant altered mental status. Stroke work up underway.   Hyperlipidemia - Zocor prior to admission - cholesterol 98 LDL 53  Hypertension history  Left lower extremity DVT  Coronary artery disease  Previous stroke in 2012  Paroxysmal atrial fibrillation.  Coronary artery disease  Diabetes mellitus - hemoglobin A1c 6.4  Hypokalemia   Hospital day # 17  TREATMENT/PLAN  Continue aspirin 81 mg orally every day for secondary stroke prevention. I would recommend only doing this for 2 weeks as the patient already is on Xarelto anticoagulation due to atrial fibrillation.  Stroke team will sign off.  Followup Dr. Jae Dire  in 2 - 3 weeks.  Repeat MRI prior to followup visit.   Mikey Bussing PA-C [facility] Pager [phone] 06/05/2013, 8:30 AM  I evaluated and examined patient, reviewed records, labs and imaging, and agree with  note and plan.   In setting of fever, confusion, and CSF pleocytosis, if this MRI/DWI abnormality could represent focal abscess or other infection, then it may be symptomatic. This MRI brain study was limited by patient motion and intolerance of exam, and therefore only limited study was performed. Consider repeat MRI brain (with and without) when patient is stable.   Antony Contras, MD   Please refer to [email or web address] for on-call Stroke MD   To contact Stroke Continuity provider, please refer to [email or web address] After hours, contact General Neurology

## 2013-06-05 NOTE — Clinical Social Work Note (Signed)
CSW talked with patient's brother Stryder Poitra and sister-in-law Baker Janus regarding SNF placement in patient's room. During visit patient was asleep. Their preferences are [facility] and [facility]. CSW will follow-up with brother with bed offers.  Tacari Repass Givens, MSW, LCSW [phone]

## 2013-06-05 NOTE — Progress Notes (Signed)
PROGRESS NOTE  [name] DOB: May 15, 1931 DOA: 05/19/2013 PCP: Leonides Grills, MD   [name] is a 80 y.o. male with a history of hypertension, hyperlipidemia, left leg DVT, coronary artery disease, history of rectal bleeding, prostate cancer status post radiation, presents emergency department for bright red blood per rectum. Patient states this has been ongoing for approximately 4 days. He had a similar episode in February of 2015 at which point he underwent a sigmoidoscopy which showed bleeding from a dilated capillary, which was due to self disimpaction. Patient was recently admitted on 05/07/2013 weakness and pain control shingles. He is currently taking Valtrex and prednisone. Patient is currently in pain. He only complains of shaking and weakness.  GI was consulted initially. Initially felt for no intervention if hgb remained stable. Pt later became febrile with tmax of 102 with ms changes. Pan cultures were unremarkable. Neurology was consulted who recommended LP. LP was notable for a wbc of 18. Empiric acyclovir was started. ID was consulted with recs for added vanc, rocephin, and amp.    Further workup showed that his fevers more likely were coming from generalized disseminated gout rather than infection. He has responded very well to IV Solu-Medrol and then to oral prednisone despite being off of antibiotics.     Assessment/Plan:   Fever with decreased mental status (neurology and ID following)  -U/A clear, chest x-ray clear, blood cultures negative so far. CSF cultures also negative to date. -Borderline CSF ID consulted and patient currently was on empiric Vanco-ampicillin-Rocephin along with acyclovir stopped by id 06-02-13.  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 He has shown remarkable improvement after a trial of IV Solu-Medrol which was started on 06/02/2013, we'll switch him to oral prednisone on 06/05/2013 and continue  colchicine.   Acute CVA  On repeat MRI 1.5 cm acute infarction affecting a left frontal gyrus at the vertex. Placed on ASA for 2 weeks then to be stopped as he is on xaralto per neurology, continue on tele, obtain PT, OT, speech input, stable A1c and lipid panel, stable carotid duplex, neurology following. stable TEE.   Lab Results  Component Value Date   HGBA1C 6.4* 06/01/2013    Lab Results  Component Value Date   CHOL 98 06/01/2013   HDL 29* 06/01/2013   LDLCALC 53 06/01/2013   TRIG 82 06/01/2013   CHOLHDL 3.4 06/01/2013     Generalized disseminated gout  Despite normal uric acid levels there is a possibility that this could be disseminated gout, he does have some pain in his left ankle and wrist, left wrist x-ray stable, left ankle has some redness which is acute on chronic per patient and family, now improving on steroids and colchicine started on 06/02/2013      Hypokalemia  Replaced oral x 2 and now stable    Baseline at home appears to be sitting in chair and pivoting to toilet Continue PTOT. May require placement    Painless hematochezia.   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 S/p sigmoidoscopy show no other source of bleeding about 6 weeks ago.  -started Miralax 2 to 3 times a day - changed to PRN as pt started having loose stools -Hgb 10.5 currently (was 13 on 4/24). Hemodynamically stable. Cont to follow closely -Resumed on home dose Xaralto closely monitor hemoglobin    Shingles  -Much improved on lidocaine patch and amitriptyline    Hypertension  -Continue metoprolol and Lasix     Diabetes mellitus  with neuropathy  -Hold glipizide placed on Lantus, continue ISS -Neurontin held due to decreased mental status -Last hemoglobin A1c November 2014 6.6  CBG (last 3)   Recent Labs  06/04/13 1702 06/04/13 2257 06/05/13 0757  GLUCAP 205* 224* 196*      Chronic Anticoagulation - Stable on xaralto     Code Status: DNR Family Communication:  family at bedside updated personally on 05/31/2013 , 06/01/2013, 06/03/2013, 06/04/2013, 06/05/2013 Disposition Plan: SNF     Consultants:  GI  Neurology  ID  Procedures:  MRI brain 4/28  Repeat MRI 05/31/2013    LP   TEE  Carotid US   TEE  - Left ventricle: The cavity size was normal. Wall thickness was normal. Systolic function was normal. The estimated ejection fraction was in the range of 55% to 60%. - Aortic valve: No evidence of vegetation. No evidence of vegetation. - Mitral valve: No evidence of vegetation. - Left atrium: The atrium was dilated. No evidence of hrombus in the atrial cavity or appendage. No spontaneous cho contrast was observed. - Right atrium: No evidence of thrombus in the atrial cavity or  appendage. - Atrial septum: No defect or patent foramen ovale was identified. Echo contrast study showed no right-to-left atrial level shunt, following an increase in RA pressure induced by provocative maneuvers. - Tricuspid valve: No evidence of vegetation. - Pulmonic valve: No evidence of vegetation.      HPI/Subjective:  Today,  lying in bed in no discomfort. Feels much better, feels that he has to catch up on sleep, surrounded by family members, no headache, no chest abdominal pain. No joint pains today.    Objective:  Intake/Output Summary (Last 24 hours) at 06/05/13 0834 Last data filed at 06/05/13 0500  Gross per 24 hour  Intake    480 ml  Output   1175 ml  Net   -695 ml   Filed Weights   06/03/13 0526 06/04/13 0615 06/05/13 0510  Weight: 100.88 kg (222 lb 6.4 oz) 99.111 kg (218 lb 8 oz) 100.064 kg (220 lb 9.6 oz)    Filed Vitals:   06/04/13 0615 06/04/13 1450 06/04/13 2258 06/05/13 0510  BP: 125/74 139/76 125/73 125/69  Pulse: 104 95 75 82  Temp: 97.3 F (36.3 C) 97.8 F (36.6 C) 97.4 F (36.3 C) 97.4 F (36.3 C)  TempSrc: Oral Oral Oral Oral  Resp: 20 20 20 20   Height:      Weight: 99.111 kg (218 lb 8 oz)   100.064 kg (220  lb 9.6 oz)  SpO2: 96% 94% 95% 94%   Exam:  General: Alert and less drowsy in the bed, he looks and feels much better and is in no distress, daughter bedside Cardiovascular: S1 S2 auscultated, no rubs, murmurs or gallops. Regular rate and rhythm.  Respiratory: Clear to auscultation bilaterally with equal chest rise  Abdomen: Soft, obese, nontender, nondistended, + bowel sounds  Neuro: moves all 4 ext- eyes closed Skin: scarring on right thigh  Data Reviewed:   Basic Metabolic Panel:  Recent Labs Lab 06/01/13 0540 06/02/13 0645 06/03/13 0630 06/04/13 0325 06/05/13 0644  NA 138 137 134* 134* 139  K 3.9 3.0* 3.6* 4.8 4.5  CL 98 94* 96 98 102  CO2 25 26 28  18* 24  GLUCOSE 100* 92 191* 194* 220*  BUN 12 13 19 23  33*  CREATININE 0.84 0.96 0.82 0.78 0.90  CALCIUM 9.5 9.5 9.4 10.0 9.7  MG  --   --  2.0  --   --  Liver Function Tests: No results found for this basename: AST, ALT, ALKPHOS, BILITOT, PROT, ALBUMIN,  in the last 168 hours  Recent Labs Lab 05/30/13 1724  LIPASE 11  AMYLASE 20   No results found for this basename: AMMONIA,  in the last 168 hours CBC:  Recent Labs Lab 06/01/13 0540 06/02/13 0645 06/03/13 0630 06/04/13 0325 06/05/13 0644  WBC 10.6* 11.6* 11.2* 13.0* 10.2  HGB 11.6* 12.0* 10.5* 10.9* 10.1*  HCT 34.7* 36.6* 31.5* 32.3* 30.5*  MCV 101.5* 101.9* 100.0 101.3* 101.7*  PLT 254 229 220 254 255   Cardiac Enzymes: No results found for this basename: CKTOTAL, CKMB, CKMBINDEX, TROPONINI,  in the last 168 hours BNP (last 3 results) No results found for this basename: PROBNP,  in the last 8760 hours CBG:  Recent Labs Lab 06/04/13 0752 06/04/13 1153 06/04/13 1702 06/04/13 2257 06/05/13 0757  GLUCAP 167* 157* 205* 224* 196*    Recent Results (from the past 240 hour(s))  CSF CULTURE     Status: None   Collection Time    05/29/13  2:20 PM      Result Value Ref Range Status   Specimen Description CSF   Final   Special Requests NONE    Final   Gram Stain     Final   Value: WBC PRESENT, PREDOMINANTLY MONONUCLEAR     NO ORGANISMS SEEN     CYTOSPIN Performed at [facility]     Performed at [facility]   Culture     Final   Value: NO GROWTH 3 DAYS     Performed at [facility]   Report Status 06/01/2013 FINAL   Final  GRAM STAIN     Status: None   Collection Time    05/29/13  2:20 PM      Result Value Ref Range Status   Specimen Description CSF   Final   Special Requests NONE   Final   Gram Stain     Final   Value: WBC PRESENT, PREDOMINANTLY MONONUCLEAR     NO ORGANISMS SEEN     CYTOSPIN SLIDE   Report Status 05/29/2013 FINAL   Final  URINE CULTURE     Status: None   Collection Time    05/30/13  6:42 PM      Result Value Ref Range Status   Specimen Description URINE, CLEAN CATCH   Final   Special Requests NONE   Final   Culture  Setup Time     Final   Value: 05/30/2013 22:05     Performed at [facility] Count     Final   Value: 40,000 COLONIES/ML     Performed at [facility]   Culture     Final   Value: Multiple bacterial morphotypes present, none predominant. Suggest appropriate recollection if clinically indicated.     Performed at [facility]   Report Status 05/31/2013 FINAL   Final  CULTURE, BLOOD (ROUTINE X 2)     Status: None   Collection Time    05/31/13  8:18 AM      Result Value Ref Range Status   Specimen Description BLOOD LEFT ARM   Final   Special Requests BOTTLES DRAWN AEROBIC AND ANAEROBIC 5CC   Final   Culture  Setup Time     Final   Value: 05/31/2013 12:28     Performed at [place]     Final  Value:        BLOOD CULTURE RECEIVED NO GROWTH TO DATE CULTURE WILL BE HELD FOR 5 DAYS BEFORE ISSUING A FINAL NEGATIVE REPORT     Performed at [facility]   Report Status PENDING   Incomplete  CLOSTRIDIUM DIFFICILE BY PCR     Status: None   Collection Time    05/31/13  7:50 PM      Result Value Ref Range  Status   C difficile by pcr NEGATIVE  NEGATIVE Final     Studies: No results found.  Scheduled Meds:  Anti-infectives   Start     Dose/Rate Route Frequency Ordered Stop   06/03/13 0600  vancomycin (VANCOCIN) 1,250 mg in sodium chloride 0.9 % 250 mL IVPB  Status:  Discontinued     1,250 mg 166.7 mL/hr over 90 Minutes Intravenous Every 24 hours 06/02/13 1432 06/02/13 1436   06/02/13 0015  vancomycin (VANCOCIN) 1,250 mg in sodium chloride 0.9 % 250 mL IVPB  Status:  Discontinued     1,250 mg 166.7 mL/hr over 90 Minutes Intravenous Every 24 hours 06/02/13 1436 06/02/13 1540   05/31/13 1200  ampicillin (OMNIPEN) 2 g in sodium chloride 0.9 % 50 mL IVPB  Status:  Discontinued     2 g 150 mL/hr over 20 Minutes Intravenous 6 times per day 05/31/13 0826 06/02/13 1540   05/31/13 0600  vancomycin (VANCOCIN) IVPB 1000 mg/200 mL premix  Status:  Discontinued     1,000 mg 200 mL/hr over 60 Minutes Intravenous Every 12 hours 05/30/13 1639 06/02/13 1432   05/30/13 2200  cefTRIAXone (ROCEPHIN) 2 g in dextrose 5 % 50 mL IVPB  Status:  Discontinued     2 g 100 mL/hr over 30 Minutes Intravenous Every 12 hours 05/30/13 1621 06/02/13 1540   05/30/13 1645  vancomycin (VANCOCIN) 2,000 mg in sodium chloride 0.9 % 500 mL IVPB     2,000 mg 250 mL/hr over 120 Minutes Intravenous STAT 05/30/13 1639 05/30/13 2003   05/30/13 1630  ampicillin (OMNIPEN) 1 g in sodium chloride 0.9 % 50 mL IVPB  Status:  Discontinued     1 g 150 mL/hr over 20 Minutes Intravenous 6 times per day 05/30/13 1621 05/31/13 0826   05/30/13 0600  acyclovir (ZOVIRAX) 700 mg in dextrose 5 % 100 mL IVPB  Status:  Discontinued     700 mg 114 mL/hr over 60 Minutes Intravenous Every 8 hours 05/30/13 0217 06/01/13 1337   05/29/13 1800  acyclovir (ZOVIRAX) 965 mg in dextrose 5 % 150 mL IVPB  Status:  Discontinued     10 mg/kg  96.6 kg 169.3 mL/hr over 60 Minutes Intravenous Every 8 hours 05/29/13 1702 05/30/13 0217   05/19/13 2200  valACYclovir  (VALTREX) tablet 500 mg  Status:  Discontinued     500 mg Oral 2 times daily 05/19/13 1726 05/20/13 1103     . allopurinol  300 mg Oral q morning - 10a  . amitriptyline  12.5 mg Oral QHS  . aspirin  81 mg Oral Daily  . atorvastatin  20 mg Oral q1800  . calcium-vitamin D  1 tablet Oral QPM  . colchicine  0.6 mg Oral BID  . diltiazem  180 mg Oral q morning - 10a  . feeding supplement (GLUCERNA SHAKE)  237 mL Oral BID PC  . insulin aspart  0-15 Units Subcutaneous TID WC  . insulin aspart  0-5 Units Subcutaneous QHS  . insulin aspart  4 Units Subcutaneous TID WC  .  insulin glargine  15 Units Subcutaneous BID  . lidocaine  1 patch Transdermal Q24H  . metoprolol  25 mg Oral BID  . multivitamin with minerals  1 tablet Oral QPM  . polyethylene glycol  17 g Oral BID  . predniSONE  50 mg Oral Q breakfast  . rivaroxaban  20 mg Oral Q supper    Time spent: 35 min   06/05/2013, 8:34 AM  LOS: 17 days   Thurnell Lose M.D on 06/05/2013 at 8:34 AM  Between 7am to 7pm - Pager - [phone], After 7pm go to [email or web address] - password [email or web address]  And look for the night coverage person covering me after hours  [facility]  Office  [phone]

## 2013-06-06 LAB — CULTURE, BLOOD (ROUTINE X 2): CULTURE: NO GROWTH

## 2013-06-06 LAB — GLUCOSE, CAPILLARY
GLUCOSE-CAPILLARY: 139 mg/dL — AB (ref 70–99)
Glucose-Capillary: 141 mg/dL — ABNORMAL HIGH (ref 70–99)
Glucose-Capillary: 189 mg/dL — ABNORMAL HIGH (ref 70–99)

## 2013-06-06 MED ORDER — ASPIRIN 81 MG PO CHEW: 81.0000 mg | CHEWABLE_TABLET | Freq: Every day | ORAL | Status: AC

## 2013-06-06 MED ORDER — INSULIN GLARGINE 100 UNIT/ML ~~LOC~~ SOLN: 15.0000 [IU] | Freq: Every day | SUBCUTANEOUS | Status: AC

## 2013-06-06 MED ORDER — INSULIN ASPART 100 UNIT/ML ~~LOC~~ SOLN: 0.0000 [IU] | Freq: Three times a day (TID) | SUBCUTANEOUS | Status: AC

## 2013-06-06 MED ORDER — PREDNISONE 10 MG PO TABS: 10.0000 mg | ORAL_TABLET | Freq: Two times a day (BID) | ORAL | Status: AC | PRN

## 2013-06-06 MED ORDER — COLCHICINE 0.6 MG PO TABS: 0.6000 mg | ORAL_TABLET | Freq: Two times a day (BID) | ORAL | Status: AC

## 2013-06-06 MED ORDER — PREDNISONE 50 MG PO TABS: ORAL_TABLET | ORAL | Status: AC

## 2013-06-06 NOTE — Progress Notes (Signed)
Pt returned form CT. Still unresponsive. CT was negative. Pt assesses with normal breathing patterns. No sign of distress. Condom cath in place. Fluids infusing. No meds given, will monitor closely.

## 2013-06-06 NOTE — Progress Notes (Signed)
SLP Cancellation Note  Patient Details Name: [name] MRN: [id number] DOB: 1931-05-23   Cancelled treatment:        Attempted to see pt. For follow-up assessment of dysphagia.  Pt. Currently off the floor for CT scan.  Will return 06/07/13.   Joaquim Nam 06/06/2013, 4:53 PM

## 2013-06-06 NOTE — Progress Notes (Signed)
Patient discharged to [facility] in [place] report given to [facility] LPN

## 2013-06-06 NOTE — Discharge Summary (Addendum)
[name], is a 80 y.o. male  DOB [birth year]  MRN [id number].  Admission date:  05/19/2013  Admitting Physician  Cristal Ford, DO  Discharge Date:  06/06/2013   Primary MD  Leonides Grills, MD  Recommendations for primary care physician for things to follow:   Monitor CBC, BMP, repeat 2 view chest x-ray in a week.  Monitor secondary  risk factors for CVA   Admission Diagnosis  Atrial fibrillation [427.31] Hyperlipidemia [272.4] Prostate cancer [185] Weakness [780.79] Hypertension [401.9] GI bleed [578.9] Lower GI bleed [578.9] Generalized weakness [780.79] Herpes zoster [053.9] Gout [274.9]   Discharge Diagnosis  Atrial fibrillation [427.31] Hyperlipidemia [272.4] Prostate cancer [185] Weakness [780.79] Hypertension [401.9] GI bleed [578.9] Lower GI bleed [578.9] Generalized weakness [780.79] Herpes zoster [053.9] Gout [274.9]    Principal Problem:   Lower GI bleed Active Problems:   Hypertension   Gout   Hyperlipidemia   Deep vein thrombophlebitis of left leg   Cerebrovascular event   Prostate cancer   Atrial fibrillation   GI bleed   Paroxysmal a-fib      Past Medical History  Diagnosis Date  . Hypertension   . Gout     Acute episode involving ankle in 12/2011  . Hyperlipidemia   . Deep vein thrombophlebitis of left leg 11/2010    LLE; S/P THA  . Fasting hyperglycemia   . Stroke 08/2009; 07/2010    TIA in 07/2010  . H/O heat stroke 08/2009  . Prostate cancer 2013    adenocarcinoma; Gleason grade 8; PSA 9.45; EBRT 2013-14  . Renal cyst   . Syncope     01/2012: Negative pharmacologic stress nuclear  . PAF (paroxysmal atrial fibrillation)     a. first noted 01/2012 during admission for URI;  b. seen again 03/2012 after nstemi of unknown origin - pradaxa initiated.  Marland Kitchen CAD (coronary artery  disease)     a. 01/2012 Elev Ti-->nonischemic MV;  b. 03/2012 NSTEMI/Cath: LM 50, LAD 50p/m, 51m/d, LCX nl, RI 60ost sup branch, RCA diff nonobs, EF 55%  . Radiation proctitis 02/2013  . NSTEMI (non-ST elevated myocardial infarction) 02/2013  . Diabetes mellitus without complication     Past Surgical History  Procedure Laterality Date  . Hip arthroplasty  12/08/2010    ARTHROPLASTY BIPOLAR HIP;  Surgeon-Olin; Left  . 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 I&d extremity  05/28/2011    Procedure: IRRIGATION AND DEBRIDEMENT EXTREMITY;  Surgeon: Linna Hoff, MD;  Location: [facility];  Service: Orthopedics;  Laterality: Right;  I & D Right hand/wrist  . Colonoscopy   07/18/2003    RMR: Rectum internal hemorrhoids.  Otherwise, normal rectum/ Left-sided diverticula.  Remainder of colonic mucosa-nl  . Flexible sigmoidoscopy N/A 03/18/2013    Procedure: FLEXIBLE SIGMOIDOSCOPY;  Surgeon: Wonda Horner, MD;  Location: [facility] ENDOSCOPY;  Service: Endoscopy;  Laterality: N/A;  . Cardiac catheterization  03/2012    non-obstructive ASCAD, medial management only,  pt does not want repeat cath  . Tee without cardioversion N/A 06/02/2013    Procedure: TRANSESOPHAGEAL ECHOCARDIOGRAM (TEE);  Surgeon: Thurmon Fair, MD;  Location: [facility] ENDOSCOPY;  Service: Cardiovascular;  Laterality: N/A;     Discharge Condition: Stable   Follow UP  Follow-up Information   Follow up with PENUMALLI,VIKRAM, MD In 3 weeks.   Specialties:  Neurology, Radiology   Contact information:   [address] [place] [address] [phone]       Follow up with Kirk Ruths, MD. Schedule an appointment as soon as possible for a visit in 1 week.   Specialty:  Family Medicine   Contact information:   [address] [place] [address] [phone]       Follow up with Stan Head, MD. Schedule an appointment as soon as possible for a visit in 1 month.    Specialty:  Gastroenterology   Contact information:   [address] [place] [address] [phone]         Discharge Instructions  and  Discharge Medications           Discharge Orders   Future Orders Complete By Expires   Discharge instructions  As directed    Scheduling Instructions:   MRI of the brain one week before follow up visit with Dr Marjory Lies. Call office to schedule office visit and MRI appointment.   Follow with Primary MD Kirk Ruths, MD in 7 days   Get CBC, CMP, checked 7 days by Primary MD and again as instructed by your Primary MD. Get a 2 view Chest X ray done next visit .   Accuchecks 4 times/day, Once in AM empty stomach and then before each meal. Log in all results and show them to your Prim.MD in 3 days. If any glucose reading is under 80 or above 300 call your Prim MD immidiately. Follow Low glucose instructions for glucose under 80 as instructed.  Activity: As tolerated with Full fall precautions use walker/cane & assistance as needed   Disposition SNF   Diet: Heart Healthy - Low Carb with feeding assistance and aspiration .  For Heart failure patients - Check your Weight same time everyday, if you gain over 2 pounds, or you develop in leg swelling, experience more shortness of breath or chest pain, call your Primary MD immediately. Follow Cardiac Low Salt Diet and 1.8 lit/day fluid restriction.   On your next visit with her primary care physician please Get Medicines reviewed and adjusted.  Please request your Prim.MD to go over all Hospital Tests and Procedure/Radiological results at the follow up, please get all Hospital records sent to your Prim MD by signing hospital release before you go home.   If you experience worsening of your admission symptoms, develop shortness of breath, life threatening emergency, suicidal or homicidal thoughts you must seek medical attention immediately by calling 911 or calling your MD immediately   if symptoms less severe.  You Must read complete instructions/literature along with all the possible adverse reactions/side effects for all the Medicines you take and that have been prescribed to you. Take any new Medicines after you have completely understood and accpet all the possible adverse reactions/side effects.   Do not drive and provide baby sitting services if your were admitted for syncope or siezures until you have seen by Primary MD or a Neurologist and advised to do so again.  Do not drive when taking Pain medications.  Do not take more than prescribed Pain, Sleep and Anxiety Medications  Special Instructions: If you have smoked or chewed Tobacco  in the last 2 yrs please stop smoking, stop any regular Alcohol  and or any Recreational drug use.  Wear Seat belts while driving.   Please note  You were cared for by a hospitalist during your hospital stay. If you have any questions about your discharge medications or the care you received while you were in the hospital after you are discharged, you can call the unit and asked to speak with the hospitalist on call if the hospitalist that took care of you is not available. Once you are discharged, your primary care physician will handle any further medical issues. Please note that NO REFILLS for any discharge medications will be authorized once you are discharged, as it is imperative that you return to your primary care physician (or establish a relationship with a primary care physician if you do not have one) for your aftercare needs so that they can reassess your need for medications and monitor your lab values.   Increase activity slowly  As directed        Medication List    STOP taking these medications       gabapentin 300 MG capsule  Commonly known as:  NEURONTIN     glipiZIDE 2.5 MG 24 hr tablet  Commonly known as:  GLUCOTROL XL      TAKE these medications       allopurinol 300 MG tablet  Commonly known as:   ZYLOPRIM  Take 300 mg by mouth every morning.     aspirin 81 MG chewable tablet  Chew 1 tablet (81 mg total) by mouth daily.     calcium-vitamin D 500-200 MG-UNIT per tablet  Commonly known as:  OSCAL WITH D  Take 1 tablet by mouth every evening.     COD LIVER OIL PO  Take 1 tablet by mouth 2 (two) times daily.     colchicine 0.6 MG tablet  Take 1 tablet (0.6 mg total) by mouth 2 (two) times daily.     diltiazem 180 MG 24 hr capsule  Commonly known as:  CARDIZEM CD  Take 180 mg by mouth every morning.     furosemide 20 MG tablet  Commonly known as:  LASIX  Take 20 mg by mouth daily as needed for fluid.     HYDROcodone-acetaminophen 10-325 MG per tablet  Commonly known as:  NORCO  Take 1 tablet by mouth every 6 (six) hours as needed for moderate pain.     insulin aspart 100 UNIT/ML injection  Commonly known as:  novoLOG  Inject 0-10 Units into the skin 3 (three) times daily with meals. Before each meal 3 times a day, 140-199 - 2 units, 200-250 - 4 units, 251-299 - 6 units,  300-349 - 8 units,  350 or above 10 units.     insulin glargine 100 UNIT/ML injection  Commonly known as:  LANTUS  Inject 0.15 mLs (15 Units total) into the skin daily.     metoprolol 50 MG tablet  Commonly known as:  LOPRESSOR  Take 25 mg by mouth 2 (two) times daily. TAKEHALF TAB IN AM TAKE HALF TAB AT PM     multivitamin with minerals Tabs tablet  Take 1 tablet by mouth every evening.     mupirocin ointment 2 %  Commonly known as:  BACTROBAN  Apply 1 application topically 3 (three) times daily.  nitroGLYCERIN 0.4 MG SL tablet  Commonly known as:  NITROSTAT  Place 0.4 mg under the tongue every 5 (five) minutes as needed for chest pain.     polyethylene glycol packet  Commonly known as:  MIRALAX / GLYCOLAX  Take 17 g by mouth daily as needed for mild constipation or severe constipation.     predniSONE 50 MG tablet  Commonly known as:  DELTASONE  To be taken once a day, tapered over 2  weeks, patient chronically takes 10-20 mg of prednisone on as needed basis for joint pains.     predniSONE 10 MG tablet  Commonly known as:  DELTASONE  Take 1-2 tablets (10-20 mg total) by mouth 2 (two) times daily as needed (Currently taking 1 tablet in the morning and one tablet at bedtime for gout.). Hold this while you are on higher dose prednisone taper     rivaroxaban 20 MG Tabs tablet  Commonly known as:  XARELTO  Take 1 tablet (20 mg total) by mouth daily with supper.     SAVAYSA 60 MG Tabs  Generic drug:  Edoxaban Tosylate  Take 60 mg by mouth daily.     simvastatin 20 MG tablet  Commonly known as:  ZOCOR  Take 1 tablet (20 mg total) by mouth every evening.     valACYclovir 500 MG tablet  Commonly known as:  VALTREX  Take 500 mg by mouth 2 (two) times daily.          Diet and Activity recommendation: See Discharge Instructions above   Consultants:   GI  Neurology  ID   Procedures:  MRI brain 4/28  Repeat MRI 05/31/2013  LP  TEE  Carotid US    Major procedures and Radiology Reports - PLEASE review detailed and final reports for all details, in brief -     TEE  - Left ventricle: The cavity size was normal. Wall thickness was normal. Systolic function was normal. The estimated ejection fraction was in the range of 55% to 60%. - Aortic valve: No evidence of vegetation. No evidence of vegetation. - Mitral valve: No evidence of vegetation. - Left atrium: The atrium was dilated. No evidence of hrombus in the atrial cavity or appendage. No spontaneous cho contrast was observed. - Right atrium: No evidence of thrombus in the atrial cavity or appendage. - Atrial septum: No defect or patent foramen ovale was identified. Echo contrast study showed no right-to-left atrial level shunt, following an increase in RA pressure induced by provocative maneuvers. - Tricuspid valve: No evidence of vegetation.    Carotids  - The vertebral arteries appear patent with  antegrade flow. - Findings consistent with 1-39 percent stenosis involving the right internal carotid artery and the left internal carotid artery.    Dg Abd 1 View  05/31/2013   CLINICAL DATA:  Ileus  EXAM: ABDOMEN - 1 VIEW  COMPARISON:  DG ABD ACUTE W/CHEST dated 05/19/2013  FINDINGS: No dilated loops of large or small bowel. There is a moderate volume stool in the rectum. No pathologic calcifications. Degenerate changes of the spine. .  IMPRESSION: No evidence of bowel obstruction.   Electronically Signed   By: Suzy Bouchard M.D.   On: 05/31/2013 18:28   Ct Head Wo Contrast  06/05/2013   CLINICAL DATA:  Lethargy  EXAM: CT HEAD WITHOUT CONTRAST  TECHNIQUE: Contiguous axial images were obtained from the base of the skull through the vertex without intravenous contrast.  COMPARISON:  06/20/2013; 05/07/2013  FINDINGS: Remote  lacunar infarct, left cerebellum, image 7 of series 201. Brainstem, cerebral peduncle is, thalami, and basal ganglia intact. Periventricular white matter and corona radiata hypodensities favor chronic ischemic microvascular white matter disease. The site of the small left frontal lobe infarct is only very faintly seen on images 26-27 of series 201. No new infarct compared to the prior exam. No intracranial hemorrhage or mass lesion.  Chronic bilateral maxillary sinusitis. Right mastoid effusion. There is atherosclerotic calcification of the cavernous carotid arteries bilaterally.  IMPRESSION: 1. No new infarct. The known left frontal lobe small infarct shown on recent MRI is only very faintly visible on today's CT. 2. Periventricular white matter and corona radiata hypodensities favor chronic ischemic microvascular white matter disease. 3. Chronic maxillary sinusitis bilaterally, with a right mastoid effusion.   Electronically Signed   By: Sherryl Barters M.D.   On: 06/05/2013 20:10   Mr Brain Wo Contrast  05/31/2013   CLINICAL DATA:  Altered mental status.  Confusion.  Agitation.   EXAM: MRI HEAD WITHOUT CONTRAST  TECHNIQUE: Multiplanar, multiecho pulse sequences of the brain and surrounding structures were obtained without intravenous contrast.  COMPARISON:  05/23/2013  FINDINGS: The examination is suffers from motion degradation. There is a 1.5 cm acute infarction affecting the a left frontal gyrus at the vertex. No other acute finding. There are old small vessel infarctions affecting the cerebellum. Chronic small-vessel changes affect the pons in the cerebral hemispheric white matter. No evidence of mass lesion. No hemorrhage. No extra-axial collection.  IMPRESSION: 1.5 cm acute infarction affecting a left frontal gyrus at the vertex. No swelling or gross hemorrhage. This is new since the study of 04/28.  Chronic small-vessel changes elsewhere.   Electronically Signed   By: Nelson Chimes M.D.   On: 05/31/2013 16:38   Mr Brain Wo Contrast  05/23/2013   CLINICAL DATA:  Altered mental status. Recent herpes zoster infection. Hypertension. Diabetic. Prostate cancer.  EXAM: MRI HEAD WITHOUT CONTRAST  TECHNIQUE: Multiplanar, multiecho pulse sequences of the brain and surrounding structures were obtained without intravenous contrast.  COMPARISON:  05/07/2013 head CT.  11/21/2012 brain MR.  FINDINGS: Exam is motion degraded.  No acute infarct.  No MR evidence of herpes encephalitis. If this remains of high clinical concern correlation with cerebral spinal fluid analysis may be considered.  Atrophy. Ventricular prominence may reflect superimposed mild hydrocephalus and is without change.  Mild periventricular nonspecific white matter type changes may represent result of small vessel disease although subependymal reabsorption of fluid not entirely excluded. Appearance unchanged.  No intracranial hemorrhage.  Small right vertebral artery once again noted which may predominantly end in a posterior inferior cerebellar artery distribution.  Partially empty sella unchanged and probably an incidental  finding.  Mild polypoid opacification inferior aspect maxillary sinuses. Minimal mucosal thickening remainder of paranasal sinuses.  Persistent partial opacification right mastoid air cells.  IMPRESSION: Exam is motion degraded.  No acute infarct.  No MR evidence of herpes encephalitis.  Atrophy. Ventricular prominence may reflect superimposed mild hydrocephalus and is without change.  Mild periventricular nonspecific white matter type changes may represent result of small vessel disease although subependymal reabsorption of fluid not entirely excluded. Appearance unchanged.  Mild polypoid opacification inferior aspect maxillary sinuses. Minimal mucosal thickening remainder of paranasal sinuses.  Persistent partial opacification right mastoid air cells.   Electronically Signed   By: Chauncey Cruel M.D.   On: 05/23/2013 19:12   Dg Chest Port 1 View  05/30/2013   CLINICAL DATA:  Fever  EXAM: PORTABLE CHEST - 1 VIEW  COMPARISON:  Prior radiograph from 05/27/2013  FINDINGS: Cardiomegaly again noted.  Lungs are mildly hypoinflated with secondary bronchovascular crowding. No focal infiltrate or pulmonary edema. No pleural effusion. There is no pneumothorax.  Osseous structures are unchanged.  IMPRESSION: 1. No acute cardiopulmonary abnormality. 2. Stable cardiomegaly.   Electronically Signed   By: Jeannine Boga M.D.   On: 05/30/2013 19:48   Dg Chest Port 1 View  05/27/2013   CLINICAL DATA:  Short of breath.  EXAM: PORTABLE CHEST - 1 VIEW  COMPARISON:  05/23/2013  FINDINGS: The cardiac silhouette remains mildly enlarged. No gross mediastinal or hilar masses. Lung volumes are relatively low. There is bronchovascular crowding. There is no convincing infiltrate or edema. No pleural effusion or pneumothorax.  IMPRESSION: No acute cardiopulmonary disease. Stable appearance from the prior exam.   Electronically Signed   By: Lajean Manes M.D.   On: 05/27/2013 20:11   Dg Chest Port 1 View  05/23/2013   CLINICAL DATA:   Shortness of breath, fever, confusion, history hypertension, prostate cancer, diabetes, MI  EXAM: PORTABLE CHEST - 1 VIEW  COMPARISON:  Portable exam [id number] hr compared to 05/19/2013  FINDINGS: Enlargement of cardiac silhouette.  Mildly tortuous thoracic aorta.  Mediastinal contours and pulmonary vascularity normal.  Bibasilar atelectasis greater on LEFT.  No acute infiltrate, pleural effusion or pneumothorax.  IMPRESSION: Enlargement of cardiac silhouette.  Bibasilar atelectasis.   Electronically Signed   By: Lavonia Dana M.D.   On: 05/23/2013 15:25   Dg Shoulder Left Port  06/01/2013   CLINICAL DATA:  Gout.  EXAM: PORTABLE LEFT SHOULDER - 2+ VIEW  COMPARISON:  None.  FINDINGS: There is no evidence of fracture or dislocation. There is no evidence of arthropathy or other focal bone abnormality. Soft tissues are unremarkable.  IMPRESSION: No acute soft tissue bony or abnormality identified. No evidence of fracture or dislocation. No evidence of arthropathy. No evidence to suggest gout.   Electronically Signed   By: Marcello Moores  Register   On: 06/01/2013 08:31   Dg Abd Acute W/chest  05/19/2013   CLINICAL DATA:  constipation  EXAM: ACUTE ABDOMEN SERIES (ABDOMEN 2 VIEW & CHEST 1 VIEW)  COMPARISON:  DG CHEST 1 VIEW dated 05/06/2013; CT ABD - PELV W/ CM dated 04/01/2013; CT ABD - PELV W/ CM dated 05/06/2013  FINDINGS: There is no evidence of dilated bowel loops or free intraperitoneal air. No radiopaque calculi or other significant radiographic abnormality is seen. The cardiac silhouette is enlarged. Both lungs are clear.  IMPRESSION: Negative abdominal radiographs.  No acute cardiopulmonary disease.   Electronically Signed   By: Margaree Mackintosh M.D.   On: 05/19/2013 16:32   Dg Hand Complete Left  06/01/2013   CLINICAL DATA:  Left hand swelling.  EXAM: LEFT HAND - COMPLETE 3+ VIEW  COMPARISON:  None.  FINDINGS: The bones are osteopenic. No acute fracture or subluxation identified. No radio-opaque foreign body or soft tissue  calcification. Calcified atherosclerotic change noted.  IMPRESSION: 1. No acute findings. 2. Osteopenia.   Electronically Signed   By: Kerby Moors M.D.   On: 06/01/2013 17:32   Dg Fluoro Guide Lumbar Puncture  05/29/2013   CLINICAL DATA:  Mental status change with fevers.  EXAM: DIAGNOSTIC LUMBAR PUNCTURE UNDER FLUOROSCOPIC GUIDANCE  FLUOROSCOPY TIME:  1 min 0 seconds.  PROCEDURE: Informed consent was obtained from the patient prior to the procedure, including potential complications of headache, allergy, and pain. With the patient prone,  the lower back was prepped with Betadine. 1% Lidocaine was used for local anesthesia. Lumbar puncture was performed at the L3-4 level using a 20 in gauge needle with return of clear CSF with an opening pressure of less than 9 cm water. 32ml of CSF were obtained for laboratory studies. The patient tolerated the procedure well and there were no apparent complications.  IMPRESSION: Successful lumbar puncture under fluoroscopy.   Electronically Signed   By: Lorin Picket M.D.   On: 05/29/2013 14:39    Micro Results      Recent Results (from the past 240 hour(s))  CSF CULTURE     Status: None   Collection Time    05/29/13  2:20 PM      Result Value Ref Range Status   Specimen Description CSF   Final   Special Requests NONE   Final   Gram Stain     Final   Value: WBC PRESENT, PREDOMINANTLY MONONUCLEAR     NO ORGANISMS SEEN     CYTOSPIN Performed at [facility]     Performed at [facility]   Culture     Final   Value: NO GROWTH 3 DAYS     Performed at [facility]   Report Status 06/01/2013 FINAL   Final  GRAM STAIN     Status: None   Collection Time    05/29/13  2:20 PM      Result Value Ref Range Status   Specimen Description CSF   Final   Special Requests NONE   Final   Gram Stain     Final   Value: WBC PRESENT, PREDOMINANTLY MONONUCLEAR     NO ORGANISMS SEEN     CYTOSPIN SLIDE   Report Status 05/29/2013 FINAL   Final    URINE CULTURE     Status: None   Collection Time    05/30/13  6:42 PM      Result Value Ref Range Status   Specimen Description URINE, CLEAN CATCH   Final   Special Requests NONE   Final   Culture  Setup Time     Final   Value: 05/30/2013 22:05     Performed at [place] [facility]     Final   Value: 40,000 COLONIES/ML     Performed at [facility]   Culture     Final   Value: Multiple bacterial morphotypes present, none predominant. Suggest appropriate recollection if clinically indicated.     Performed at [facility]   Report Status 05/31/2013 FINAL   Final  CULTURE, BLOOD (ROUTINE X 2)     Status: None   Collection Time    05/31/13  8:18 AM      Result Value Ref Range Status   Specimen Description BLOOD LEFT ARM   Final   Special Requests BOTTLES DRAWN AEROBIC AND ANAEROBIC 5CC   Final   Culture  Setup Time     Final   Value: 05/31/2013 12:28     Performed at [facility]   Culture     Final   Value: NO GROWTH 5 DAYS     Performed at [facility]   Report Status 06/06/2013 FINAL   Final  CLOSTRIDIUM DIFFICILE BY PCR     Status: None   Collection Time    05/31/13  7:50 PM      Result Value Ref Range Status   C difficile by pcr NEGATIVE  NEGATIVE Final     History of present illness and  Hospital Course:     Kindly see H&P for history of present illness and admission details, please review complete Labs, Consult reports and Test reports for all details in brief [name], is a 80 y.o. male, patient with history of  hypertension, hyperlipidemia, left leg DVT, coronary artery disease, history of rectal bleeding, prostate cancer status post radiation, presents emergency department for bright red blood per rectum. Patient states this has been ongoing for approximately 4 days. He had a similar episode in February of 2015 at which point he underwent a sigmoidoscopy which showed bleeding from a dilated capillary, which was due  to self disimpaction. Patient was recently admitted on 05/07/2013 weakness and pain control shingles. He is currently taking Valtrex and prednisone. Patient is currently in pain. He only complains of shaking and weakness.    GI was consulted initially. Initially felt for no intervention if hgb remained stable. Pt later became febrile with tmax of 102 with ms changes. Pan cultures were unremarkable. Neurology was consulted who recommended LP. LP was notable for a wbc of 18. Empiric acyclovir was started. ID was consulted with recs for added vanc, rocephin, and amp.    Further workup showed that his fevers more likely were coming from generalized disseminated gout rather than infection. He has responded very well to IV Solu-Medrol and then to oral prednisone despite being off of antibiotics.       Fever with decreased mental status (neurology and ID following)   -U/A clear, chest x-ray clear, blood cultures negative so far. CSF cultures also negative to date. C. difficile negative.  -Borderline CSF ID consulted and was initially placed on antibiotics along with acyclovir, all stopped stopped by id 06-02-13. Patient continues to be stable of them.  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 He has shown remarkable improvement after a trial of IV Solu-Medrol which was started on 06/02/2013, we'll switch him to oral prednisone on 06/05/2013 and continue colchicine for 2 weeks stop date 06/17/2013, on allopurinol long-term.     Acute CVA  On repeat MRI 1.5 cm acute infarction affecting a left frontal gyrus at the vertex. Placed on ASA for 2 weeks then to be stopped as he is on xaralto per neurology start date 06/17/2013, was stable on tele, tinea PT, OT, speech input at the rehabilitation facility, stable A1c and lipid panel, stable carotid duplex, stable TEE, follow with neurology in 3-4 weeks.    Lab Results   Component  Value  Date    HGBA1C  6.4*  06/01/2013    Lab Results    Component  Value  Date    CHOL  98  06/01/2013    HDL  29*  06/01/2013    LDLCALC  53  06/01/2013    TRIG  82  06/01/2013    CHOLHDL  3.4  06/01/2013      Generalized disseminated gout  Despite normal uric acid levels there is a possibility that this could be disseminated gout, he does have some pain in his left ankle and wrist, left wrist x-ray stable, left ankle has some redness which is acute on chronic per patient and family, now improving on steroids and colchicine started on 06/02/2013 for advice to stop colchicine in 2 weeks.   Patient is on long-term allopurinol, also note patient takes 10-20 mg of prednisone on as needed basis for joint pains. Currently placed on 50 mg  of prednisone will advice to taper it over the next 2 weeks gradually.     Baseline at home appears to be sitting in chair and pivoting to toilet  Continue PTOT. May require placement     Painless hematochezia.  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 S/p sigmoidoscopy show no other source of bleeding about 6 weeks ago.  -Bowel regimen if needed for constipation, can use MiraLAX twice a day when necessary. -Resumed on home dose Xaralto closely monitor hemoglobin  -Was discharge one time GI followup in one month.    Shingles  -Much improved and almost completely resolved.   Hypertension  -Continue home regimen of diltiazem, metoprolol and Lasix      Diabetes mellitus with neuropathy  -On glipizide and now placed on Lantus, continue ISS on monitor CBGs q. a.c. at bedtime adjust medications as needed. -Neurontin held due to decreased mental status, note Lantus dose will decrease once steroid is tapered.  -Last hemoglobin A1c November 2014 6.6     Chronic Anticoagulation  - Stable on xaralto      Today   Subjective:   [name] today has no headache,no chest abdominal pain,no new weakness tingling or numbness, feels much better.  Objective:   Blood pressure 130/68, pulse 76, temperature 97.8  F (36.6 C), temperature source Oral, resp. rate 19, height 5\' 8"  (1.727 m), weight 100.154 kg (220 lb 12.8 oz), SpO2 93.00%.   Intake/Output Summary (Last 24 hours) at 06/06/13 1022 Last data filed at 06/06/13 0900  Gross per 24 hour  Intake    480 ml  Output      0 ml  Net    480 ml    Exam Awake Alert, Oriented x 3, No new F.N deficits, Normal affect [place].AT,PERRAL Supple Neck,No JVD, No cervical lymphadenopathy appriciated.  Symmetrical Chest wall movement, Good air movement bilaterally, CTAB RRR,No Gallops,Rubs or new Murmurs, No Parasternal Heave +ve B.Sounds, Abd Soft, Non tender, No organomegaly appriciated, No rebound -guarding or rigidity. No Cyanosis, Clubbing or edema, No new Rash or bruise  Data Review   CBC w Diff: Lab Results  Component Value Date   WBC 10.2 06/05/2013   HGB 10.1* 06/05/2013   HCT 30.5* 06/05/2013   HCT 43 11/20/2011   PLT 255 06/05/2013   LYMPHOPCT 15 05/26/2013   MONOPCT 13* 05/26/2013   EOSPCT 2 05/26/2013   BASOPCT 0 05/26/2013    CMP: Lab Results  Component Value Date   NA 139 06/05/2013   NA 147 11/20/2011   K 4.5 06/05/2013   K 4.5 11/20/2011   CL 102 06/05/2013   CL 109 11/20/2011   CO2 24 06/05/2013   CO2 25 11/20/2011   BUN 33* 06/05/2013   BUN 11 11/20/2011   CREATININE 0.90 06/05/2013   CREATININE 1.26 12/16/2012   PROT 5.4* 05/29/2013   PROT 6.2 11/20/2011   ALBUMIN 2.2* 05/29/2013   BILITOT 0.3 05/29/2013   BILITOT 0.6 11/20/2011   ALKPHOS 57 05/29/2013   ALKPHOS 67 11/20/2011   AST 12 05/29/2013   AST 25 11/20/2011   ALT 14 05/29/2013  .   Total Time in preparing paper work, data evaluation and todays exam - 35 minutes  Thurnell Lose M.D on 06/06/2013 at 10:22 AM  [place]  [phone]

## 2013-06-06 NOTE — Clinical Social Work Note (Signed)
Clinical Social Worker facilitated patient discharge including contacting patient family and facility to confirm patient discharge plans.  Clinical information faxed to facility and family agreeable with plan.  CSW arranged ambulance transport via [facility] to [facility].  RN to call report prior to discharge.  Clinical Social Worker will sign off for now as social work intervention is no longer needed. Please consult us again if new need arises.  Jesse Alfredo Spong, LCSW [phone] 

## 2013-06-06 NOTE — Progress Notes (Signed)
[name] sister contacted CSW to inform her that patient and family have chosen [facility]. CSW informed Admissions Coordinator Levada Dy who states that she will come to hospital to speak with patient and sister this afternoon around 1:30 pm. Admissions Coordinator states that facility can accept patient after 2 pm.  Tilden Fossa, MSW, [place] Worker [phone]

## 2013-06-06 NOTE — Progress Notes (Signed)
On rounds found, Pt alert and oriented. fidgety. Completely wet and had BM. Cleaned up, changed bed and repositioned. Condom cath re-applied. No s/s of distress. Slight moans when rolled numerous times and in buttock area. Monitoring pt closely.

## 2013-06-06 NOTE — Discharge Instructions (Signed)
MRI of the brain one week before follow up visit with Dr Leta Baptist. Call office to schedule office visit and MRI appointment.   Follow with Primary MD Leonides Grills, MD in 7 days   Get CBC, CMP, checked 7 days by Primary MD and again as instructed by your Primary MD. Get a 2 view Chest X ray done next visit .   Activity: As tolerated with Full fall precautions use walker/cane & assistance as needed   Disposition SNF   Diet: Heart Healthy with feeding assistance and aspiration .  For Heart failure patients - Check your Weight same time everyday, if you gain over 2 pounds, or you develop in leg swelling, experience more shortness of breath or chest pain, call your Primary MD immediately. Follow Cardiac Low Salt Diet and 1.8 lit/day fluid restriction.   On your next visit with her primary care physician please Get Medicines reviewed and adjusted.  Please request your Prim.MD to go over all Hospital Tests and Procedure/Radiological results at the follow up, please get all Hospital records sent to your Prim MD by signing hospital release before you go home.   If you experience worsening of your admission symptoms, develop shortness of breath, life threatening emergency, suicidal or homicidal thoughts you must seek medical attention immediately by calling 911 or calling your MD immediately  if symptoms less severe.  You Must read complete instructions/literature along with all the possible adverse reactions/side effects for all the Medicines you take and that have been prescribed to you. Take any new Medicines after you have completely understood and accpet all the possible adverse reactions/side effects.   Do not drive and provide baby sitting services if your were admitted for syncope or siezures until you have seen by Primary MD or a Neurologist and advised to do so again.  Do not drive when taking Pain medications.    Do not take more than prescribed Pain, Sleep and Anxiety  Medications  Special Instructions: If you have smoked or chewed Tobacco  in the last 2 yrs please stop smoking, stop any regular Alcohol  and or any Recreational drug use.  Wear Seat belts while driving.   Please note  You were cared for by a hospitalist during your hospital stay. If you have any questions about your discharge medications or the care you received while you were in the hospital after you are discharged, you can call the unit and asked to speak with the hospitalist on call if the hospitalist that took care of you is not available. Once you are discharged, your primary care physician will handle any further medical issues. Please note that NO REFILLS for any discharge medications will be authorized once you are discharged, as it is imperative that you return to your primary care physician (or establish a relationship with a primary care physician if you do not have one) for your aftercare needs so that they can reassess your need for medications and monitor your lab values.

## 2013-06-06 NOTE — Progress Notes (Signed)
CSW spoke with colleague who stated that preference facilities are [facility] and [facility] sent updated clinicals to both facilities and spoke with Admissions Coordinators. No male beds available today at either facility. CSW will provide bed offers to patient and family this afternoon.  Tilden Fossa, MSW, [place] Social Worker [phone]

## 2013-06-12 ENCOUNTER — Other Ambulatory Visit: Payer: Self-pay | Admitting: *Deleted

## 2013-06-12 ENCOUNTER — Other Ambulatory Visit: Payer: Self-pay

## 2013-06-12 DIAGNOSIS — I639 Cerebral infarction, unspecified: Secondary | ICD-10-CM

## 2013-06-16 ENCOUNTER — Telehealth: Payer: Self-pay | Admitting: Diagnostic Neuroimaging

## 2013-06-16 NOTE — Telephone Encounter (Signed)
PT HAD MRI 06/12/13

## 2013-06-20 ENCOUNTER — Telehealth: Payer: Self-pay | Admitting: Diagnostic Neuroimaging

## 2013-06-20 NOTE — Telephone Encounter (Signed)
Calling to schedule patients MRI states she left a message last week please call.

## 2013-06-21 NOTE — Telephone Encounter (Signed)
Calling again to schedule patient's MRI.  Please return call.

## 2013-07-10 ENCOUNTER — Ambulatory Visit: Payer: Self-pay | Admitting: Diagnostic Neuroimaging

## 2013-07-17 ENCOUNTER — Ambulatory Visit: Payer: Self-pay | Admitting: Diagnostic Neuroimaging

## 2013-07-21 ENCOUNTER — Ambulatory Visit: Payer: Medicare Other | Admitting: Internal Medicine

## 2013-09-26 DEATH — deceased

## 2014-01-04 ENCOUNTER — Encounter (HOSPITAL_COMMUNITY): Payer: Self-pay | Admitting: Cardiology

## 2014-12-30 IMAGING — NM NM MYOCAR SINGLE W/SPECT W/WALL MOTION & EF
2 series · 12 of 12 positions shown · non-contrast
Comparison: none

nm myoview pharmacologic stress

Ordering Physician: TMMAM KATRIN
Nurmahomed Physician: [REDACTED]al Data: 80-year-old gentleman with a recent hospital
admission for syncope.
NUCLEAR MEDICINE ADENOSINE STRESS MYOVIEW STUDY WITH SPECT AND LEFT
VENTRIUCLAR EJECTION FRACTION
Radionuclide Data: One-day rest/stress protocol performed with
[DATE] mCi of Qc-IIm Myoview.
Stress Data: Regadenoson infusion resulted in no significant
symptoms.  There was a modest increase in heart rate from a
minimally tachycardic baseline level of 101 bpm and an slight
increase in blood pressure.  No arrhythmias noted.
EKG: Normal sinus rhythm with frequent PVCs; right bundle branch
block; delayed R-wave progression; possible prior lateral
myocardial infarction; left anterior fascicular block.  No
significant change with pharmacologic stress.
Scintigraphic Data: Imaging was performed with the patient's left
arm at his side.  Left ventricular size was normal.  On tomographic
images reconstructed in standard planes, there was thinning at the
base of the inferior wall that was of borderline numeric
significance and for which no reversibility was apparent.  The
gated reconstruction demonstrated normal regional and global left
ventricular systolic function with an estimated ejection fraction
of 56%.  There was dyssynchronous wall motion at the base of the
inferior myocardium.  Normal systolic accentuation of activity was
noted throughout.

[Series 1: cs cardiac tc hi dose · 6.41mm/px · 6 of 512 frames shown]
[frame 43/512]
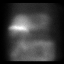
[frame 128/512]
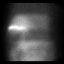
[frame 214/512]
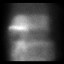
[frame 299/512]
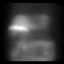
[frame 384/512]
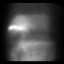
[frame 470/512]
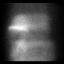

[Series 1: cr cardiac tc low dose · 6.41mm/px · 6 of 64 frames shown]
[frame 6/64]
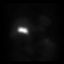
[frame 16/64]
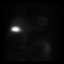
[frame 27/64]
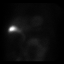
[frame 38/64]
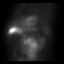
[frame 48/64]
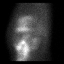
[frame 59/64]
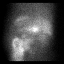

[12 of 12 positions shown; findings below may reference images not displayed]

IMPRESSION: Negative pharmacologic stress nuclear myocardial study.

## 2015-02-16 IMAGING — CR DG CHEST 1V
1 series · 1 of 1 positions shown · non-contrast
Comparison: One-view chest x-ray 04/07/2012 and portable chest x-
ray 01/02/2012 [HOSPITAL].  Portable chest x-ray
02/07/2012, 02/04/2012 [facility] [HOSPITAL].

CLINICAL DATA: Cough.  Fever.  Generalized weakness.  Current
history of prostate cancer, atrial fibrillation, coronary artery
disease.

CHEST - 1 VIEW

[view not recorded]
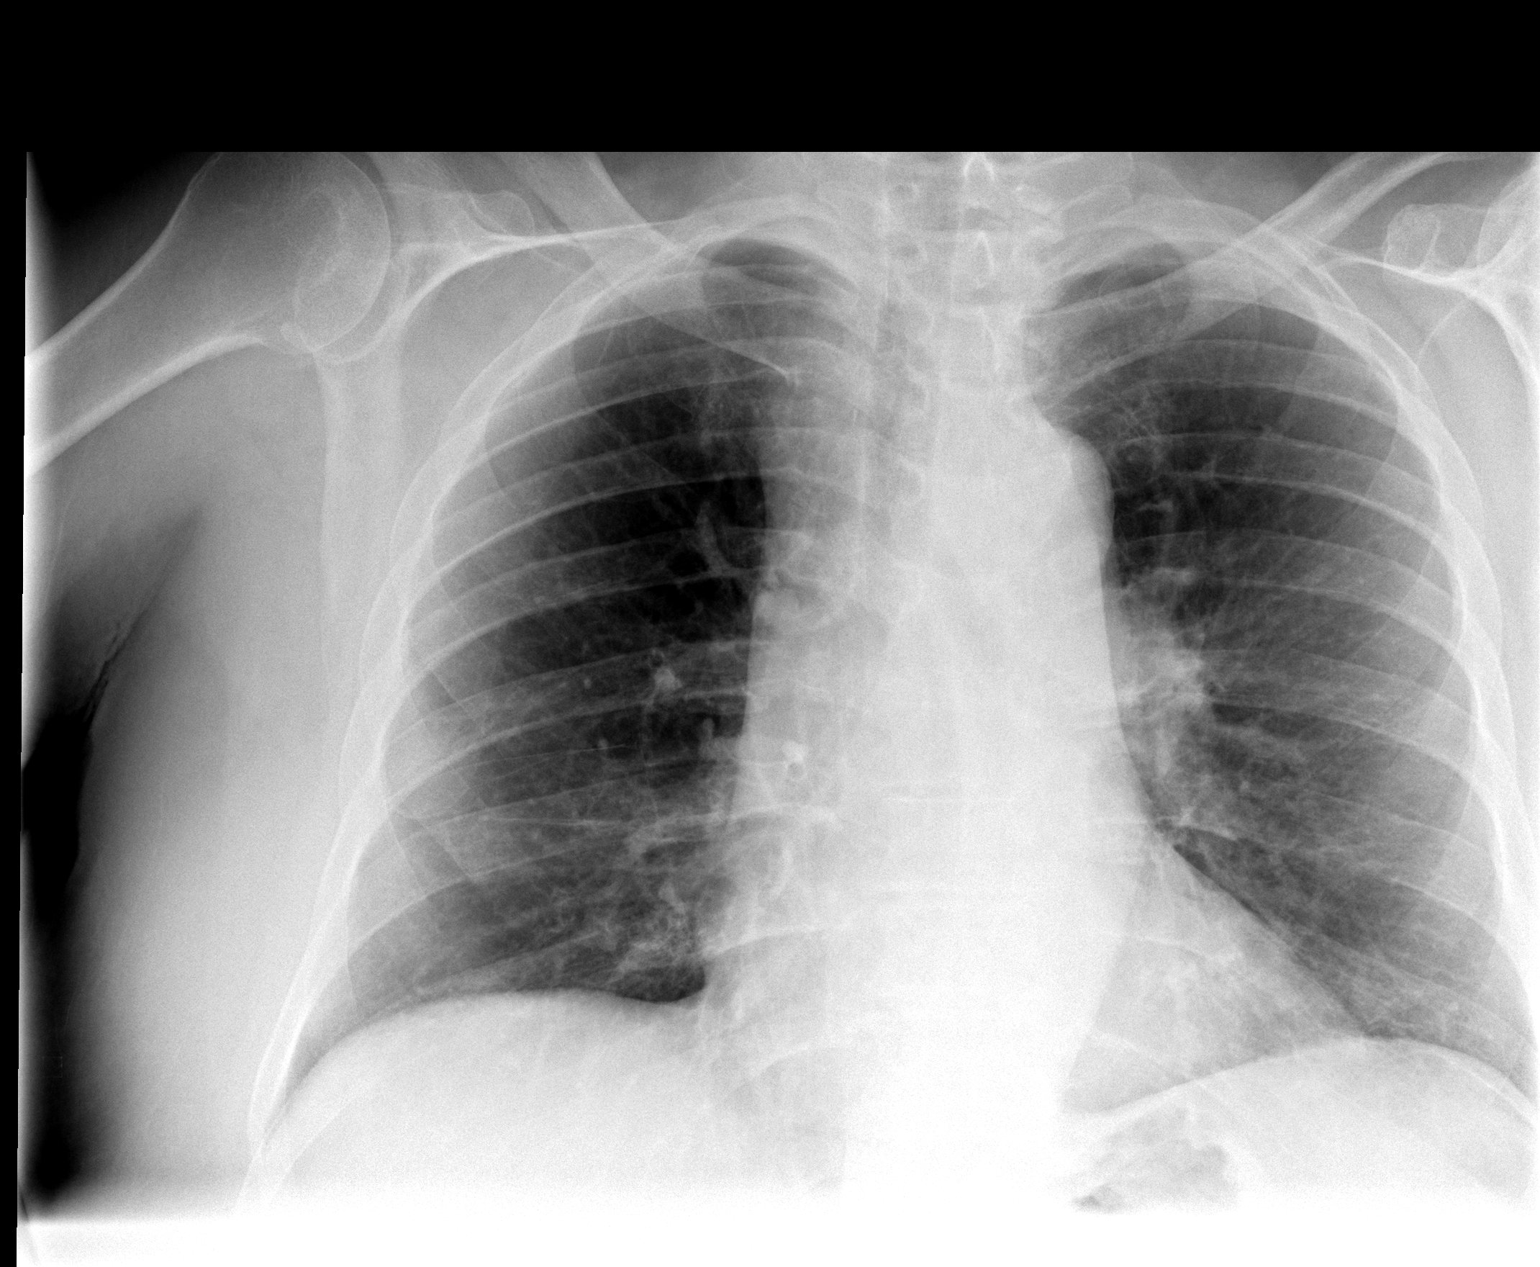

[1 of 1 positions shown; findings below may reference images not displayed]

FINDINGS: Cardiac silhouette normal in size, unchanged.  Thoracic
aorta mildly tortuous, unchanged.  Hilar and mediastinal contours
otherwise unremarkable.  Lungs clear.  Bronchovascular markings
normal.  Pulmonary vascularity normal.  No pneumothorax.  No
pleural effusions.
IMPRESSION: No acute cardiopulmonary disease.
